# Patient Record
Sex: Male | Born: 1935
Health system: Southern US, Community
[De-identification: ages and names within clinical notes are randomized; demographics above are authoritative.]

## PROBLEM LIST (undated history)

## (undated) DIAGNOSIS — I10 Essential (primary) hypertension: Secondary | ICD-10-CM

## (undated) DIAGNOSIS — N200 Calculus of kidney: Secondary | ICD-10-CM

## (undated) DIAGNOSIS — H35372 Puckering of macula, left eye: Secondary | ICD-10-CM

## (undated) DIAGNOSIS — N400 Enlarged prostate without lower urinary tract symptoms: Secondary | ICD-10-CM

## (undated) DIAGNOSIS — E7800 Pure hypercholesterolemia, unspecified: Secondary | ICD-10-CM

## (undated) DIAGNOSIS — I4892 Unspecified atrial flutter: Secondary | ICD-10-CM

## (undated) DIAGNOSIS — G971 Other reaction to spinal and lumbar puncture: Secondary | ICD-10-CM

## (undated) DIAGNOSIS — J449 Chronic obstructive pulmonary disease, unspecified: Secondary | ICD-10-CM

## (undated) DIAGNOSIS — I499 Cardiac arrhythmia, unspecified: Secondary | ICD-10-CM

## (undated) DIAGNOSIS — H9190 Unspecified hearing loss, unspecified ear: Secondary | ICD-10-CM

## (undated) DIAGNOSIS — Z87442 Personal history of urinary calculi: Secondary | ICD-10-CM

## (undated) DIAGNOSIS — M109 Gout, unspecified: Secondary | ICD-10-CM

## (undated) HISTORY — PX: BACK SURGERY: SHX140

## (undated) HISTORY — PX: EYE SURGERY: SHX253

---

## 1972-12-30 DIAGNOSIS — G971 Other reaction to spinal and lumbar puncture: Secondary | ICD-10-CM

## 1972-12-30 HISTORY — PX: LUMBAR DISC SURGERY: SHX700

## 1972-12-30 HISTORY — DX: Other reaction to spinal and lumbar puncture: G97.1

## 1979-12-31 HISTORY — PX: FOOT FRACTURE SURGERY: SHX645

## 1989-08-30 HISTORY — PX: MULTIPLE TOOTH EXTRACTIONS: SHX2053

## 2015-06-09 ENCOUNTER — Encounter (INDEPENDENT_AMBULATORY_CARE_PROVIDER_SITE_OTHER): Payer: Medicare FFS | Admitting: Ophthalmology

## 2015-06-09 DIAGNOSIS — H2513 Age-related nuclear cataract, bilateral: Secondary | ICD-10-CM | POA: Diagnosis not present

## 2015-06-09 DIAGNOSIS — H35033 Hypertensive retinopathy, bilateral: Secondary | ICD-10-CM

## 2015-06-09 DIAGNOSIS — H35372 Puckering of macula, left eye: Secondary | ICD-10-CM

## 2015-06-09 DIAGNOSIS — H3531 Nonexudative age-related macular degeneration: Secondary | ICD-10-CM

## 2015-06-09 DIAGNOSIS — I10 Essential (primary) hypertension: Secondary | ICD-10-CM | POA: Diagnosis not present

## 2015-06-09 DIAGNOSIS — H43813 Vitreous degeneration, bilateral: Secondary | ICD-10-CM | POA: Diagnosis not present

## 2015-07-17 ENCOUNTER — Encounter (HOSPITAL_COMMUNITY): Payer: Self-pay | Admitting: *Deleted

## 2015-07-17 MED ORDER — CEFAZOLIN SODIUM-DEXTROSE 2-3 GM-% IV SOLR
2.0000 g | INTRAVENOUS | Status: AC
  Administered 2015-07-18: 2 g via INTRAVENOUS

## 2015-07-17 NOTE — Progress Notes (Signed)
   07/17/15 1625  OBSTRUCTIVE SLEEP APNEA  Have you ever been diagnosed with sleep apnea through a sleep study? No  Do you snore loudly (loud enough to be heard through closed doors)?  1  Do you often feel tired, fatigued, or sleepy during the daytime? 0  Has anyone observed you stop breathing during your sleep? 0  Do you have, or are you being treated for high blood pressure? 1  Age over 79 years old? 1  Gender: 1  Obstructive Sleep Apnea Score 4

## 2015-07-17 NOTE — Progress Notes (Signed)
Pt gave verbal consent for Kendal Hymen, spouse to complete SDW-Pre-op  call.  Pt denies SOB, chest pain and being under the care of a cardiologist. Spouse denies that pt has had a chest x ray and EKG within the last year. Spouse denies that pt has had an echo, stress and cardiac cath. Spouse made aware to have pt stop taking  Aspirin, otc vitamins and herbal medications. Do not take any NSAIDs ie: Ibuprofen, Advil, Naproxen or any medication containing Aspirin. Spouse, Kendal Hymen verbalized understanding of all pre-op instructions.

## 2015-07-17 NOTE — H&P (Signed)
Timothy Mata is an 81 y.o. male.   Chief Complaint: loss of vision left eye over two months HPI: Preretinal fibrosis left eye with vision loss over two months.  No past medical history on file.  No past surgical history on file.  No family history on file. Social History:  has no tobacco, alcohol, and drug history on file.  Allergies: No Known Allergies  No prescriptions prior to admission    Review of systems otherwise negative  There were no vitals taken for this visit.  Physical exam: Mental status: oriented x3. Eyes: See eye exam associated with this date of surgery in media tab.  Scanned in by scanning center Ears, Nose, Throat: within normal limits Neck: Within Normal limits General: within normal limits Chest: Within normal limits Breast: deferred Heart: Within normal limits Abdomen: Within normal limits GU: deferred Extremities: within normal limits Skin: within normal limits  Assessment/Plan Preretinal fibrosis with macular hemorrhage.   Plan: To [facility] for Pars plana vitrectomy, laser, membrane peel, gas injection left eye.  Sherrie George 07/17/2015, 11:23 AM

## 2015-07-18 ENCOUNTER — Ambulatory Visit (HOSPITAL_COMMUNITY): Payer: Medicare FFS | Admitting: Anesthesiology

## 2015-07-18 ENCOUNTER — Encounter (HOSPITAL_COMMUNITY): Payer: Self-pay | Admitting: Anesthesiology

## 2015-07-18 ENCOUNTER — Ambulatory Visit (HOSPITAL_COMMUNITY)
Admission: RE | Admit: 2015-07-18 | Discharge: 2015-07-19 | Disposition: A | Discharge status: Home / Self Care | Payer: Medicare FFS | Source: Ambulatory Visit | Attending: Ophthalmology | Admitting: Ophthalmology

## 2015-07-18 ENCOUNTER — Encounter (HOSPITAL_COMMUNITY)
Admission: RE | Disposition: A | Discharge status: Home / Self Care | Payer: Self-pay | Source: Ambulatory Visit | Attending: Ophthalmology

## 2015-07-18 ENCOUNTER — Encounter (INDEPENDENT_AMBULATORY_CARE_PROVIDER_SITE_OTHER): Payer: Medicare FFS | Admitting: Ophthalmology

## 2015-07-18 DIAGNOSIS — H35372 Puckering of macula, left eye: Secondary | ICD-10-CM

## 2015-07-18 DIAGNOSIS — H3531 Nonexudative age-related macular degeneration: Secondary | ICD-10-CM

## 2015-07-18 DIAGNOSIS — H43813 Vitreous degeneration, bilateral: Secondary | ICD-10-CM | POA: Diagnosis not present

## 2015-07-18 DIAGNOSIS — H35033 Hypertensive retinopathy, bilateral: Secondary | ICD-10-CM | POA: Diagnosis not present

## 2015-07-18 DIAGNOSIS — I10 Essential (primary) hypertension: Secondary | ICD-10-CM

## 2015-07-18 DIAGNOSIS — H269 Unspecified cataract: Secondary | ICD-10-CM | POA: Insufficient documentation

## 2015-07-18 DIAGNOSIS — Z23 Encounter for immunization: Secondary | ICD-10-CM | POA: Diagnosis not present

## 2015-07-18 HISTORY — DX: Puckering of macula, left eye: H35.372

## 2015-07-18 HISTORY — PX: 25 GAUGE PARS PLANA VITRECTOMY WITH 20 GAUGE MVR PORT: SHX6041

## 2015-07-18 HISTORY — DX: Pure hypercholesterolemia, unspecified: E78.00

## 2015-07-18 HISTORY — PX: AIR/FLUID EXCHANGE: SHX6494

## 2015-07-18 HISTORY — DX: Calculus of kidney: N20.0

## 2015-07-18 HISTORY — DX: Chronic obstructive pulmonary disease, unspecified: J44.9

## 2015-07-18 HISTORY — DX: Other reaction to spinal and lumbar puncture: G97.1

## 2015-07-18 HISTORY — PX: PARS PLANA VITRECTOMY: SHX2166

## 2015-07-18 HISTORY — DX: Essential (primary) hypertension: I10

## 2015-07-18 HISTORY — PX: LASER PHOTO ABLATION: SHX5942

## 2015-07-18 HISTORY — DX: Gout, unspecified: M10.9

## 2015-07-18 HISTORY — PX: MEMBRANE PEEL: SHX5967

## 2015-07-18 HISTORY — DX: Unspecified hearing loss, unspecified ear: H91.90

## 2015-07-18 HISTORY — DX: Benign prostatic hyperplasia without lower urinary tract symptoms: N40.0

## 2015-07-18 LAB — BASIC METABOLIC PANEL
ANION GAP: 7 (ref 5–15)
BUN: 16 mg/dL (ref 6–20)
CALCIUM: 8.7 mg/dL — AB (ref 8.9–10.3)
CHLORIDE: 104 mmol/L (ref 101–111)
CO2: 28 mmol/L (ref 22–32)
Creatinine, Ser: 1.57 mg/dL — ABNORMAL HIGH (ref 0.61–1.24)
GFR, EST AFRICAN AMERICAN: 47 mL/min — AB (ref >60)
GFR, EST NON AFRICAN AMERICAN: 40 mL/min — AB (ref >60)
Glucose, Bld: 104 mg/dL — ABNORMAL HIGH (ref 65–99)
Potassium: 4.3 mmol/L (ref 3.5–5.1)
Sodium: 139 mmol/L (ref 135–145)

## 2015-07-18 SURGERY — 25 GAUGE PARS PLANA VITRECTOMY WITH 20 GAUGE MVR PORT
Anesthesia: General | Site: Eye | Laterality: Left

## 2015-07-18 MED ORDER — SODIUM CHLORIDE 0.9 % IJ SOLN
INTRAMUSCULAR | Status: AC
  Filled 2015-07-18: qty 10

## 2015-07-18 MED ORDER — LACTATED RINGERS IV SOLN
INTRAVENOUS | Status: DC | PRN
  Administered 2015-07-18: 12:00:00 via INTRAVENOUS

## 2015-07-18 MED ORDER — ATROPINE SULFATE 1 MG/ML IJ SOLN
INTRAMUSCULAR | Status: DC | PRN
  Administered 2015-07-18: 0.4 mg via INTRAVENOUS

## 2015-07-18 MED ORDER — BACITRACIN-POLYMYXIN B 500-10000 UNIT/GM OP OINT
TOPICAL_OINTMENT | OPHTHALMIC | Status: AC
  Filled 2015-07-18: qty 3.5

## 2015-07-18 MED ORDER — PHENYLEPHRINE HCL 2.5 % OP SOLN
1.0000 [drp] | OPHTHALMIC | Status: AC | PRN
  Administered 2015-07-18 (×3): 1 [drp] via OPHTHALMIC

## 2015-07-18 MED ORDER — 0.9 % SODIUM CHLORIDE (POUR BTL) OPTIME
TOPICAL | Status: DC | PRN
  Administered 2015-07-18: 1000 mL

## 2015-07-18 MED ORDER — TAMSULOSIN HCL 0.4 MG PO CAPS
0.4000 mg | ORAL_CAPSULE | Freq: Every day | ORAL | Status: DC
  Administered 2015-07-18: 0.4 mg via ORAL
  Filled 2015-07-18: qty 1

## 2015-07-18 MED ORDER — CYCLOPENTOLATE HCL 1 % OP SOLN
OPHTHALMIC | Status: AC
  Administered 2015-07-18: 1 [drp] via OPHTHALMIC
  Filled 2015-07-18: qty 2

## 2015-07-18 MED ORDER — BACITRACIN-POLYMYXIN B 500-10000 UNIT/GM OP OINT
1.0000 | TOPICAL_OINTMENT | Freq: Three times a day (TID) | OPHTHALMIC | Status: DC
Start: 2015-07-19 — End: 2015-07-19
  Filled 2015-07-18: qty 3.5

## 2015-07-18 MED ORDER — POLYMYXIN B SULFATE 500000 UNITS IJ SOLR
INTRAMUSCULAR | Status: AC
  Filled 2015-07-18: qty 1

## 2015-07-18 MED ORDER — BUPIVACAINE HCL (PF) 0.75 % IJ SOLN
INTRAMUSCULAR | Status: DC | PRN
  Administered 2015-07-18: 20 mL

## 2015-07-18 MED ORDER — SUGAMMADEX SODIUM 200 MG/2ML IV SOLN
INTRAVENOUS | Status: DC | PRN
  Administered 2015-07-18: 200 mg via INTRAVENOUS

## 2015-07-18 MED ORDER — LIDOCAINE HCL (CARDIAC) 20 MG/ML IV SOLN
INTRAVENOUS | Status: DC | PRN
  Administered 2015-07-18: 100 mg via INTRAVENOUS

## 2015-07-18 MED ORDER — ONDANSETRON HCL 4 MG/2ML IJ SOLN
4.0000 mg | Freq: Four times a day (QID) | INTRAMUSCULAR | Status: DC
  Administered 2015-07-18 – 2015-07-19 (×3): 4 mg via INTRAVENOUS
  Filled 2015-07-18 (×3): qty 2

## 2015-07-18 MED ORDER — ATROPINE SULFATE 1 % OP SOLN
OPHTHALMIC | Status: DC | PRN
  Administered 2015-07-18: 1 [drp] via OPHTHALMIC

## 2015-07-18 MED ORDER — SUGAMMADEX SODIUM 200 MG/2ML IV SOLN
INTRAVENOUS | Status: AC
  Filled 2015-07-18: qty 2

## 2015-07-18 MED ORDER — GATIFLOXACIN 0.5 % OP SOLN
1.0000 [drp] | OPHTHALMIC | Status: AC | PRN
  Administered 2015-07-18 (×3): 1 [drp] via OPHTHALMIC

## 2015-07-18 MED ORDER — TEMAZEPAM 15 MG PO CAPS: 15.0000 mg | ORAL_CAPSULE | Freq: Every evening | ORAL | Status: DC | PRN

## 2015-07-18 MED ORDER — SODIUM CHLORIDE 0.9 % IV SOLN
INTRAVENOUS | Status: DC
  Administered 2015-07-18: 11:00:00 via INTRAVENOUS

## 2015-07-18 MED ORDER — MOMETASONE FURO-FORMOTEROL FUM 100-5 MCG/ACT IN AERO
2.0000 | INHALATION_SPRAY | Freq: Two times a day (BID) | RESPIRATORY_TRACT | Status: DC
Start: 2015-07-18 — End: 2015-07-19
  Administered 2015-07-18 – 2015-07-19 (×2): 2 via RESPIRATORY_TRACT
  Filled 2015-07-18: qty 8.8

## 2015-07-18 MED ORDER — BRIMONIDINE TARTRATE 0.2 % OP SOLN
1.0000 [drp] | Freq: Two times a day (BID) | OPHTHALMIC | Status: DC
  Filled 2015-07-18: qty 5

## 2015-07-18 MED ORDER — LISINOPRIL-HYDROCHLOROTHIAZIDE 20-25 MG PO TABS: 1.0000 | ORAL_TABLET | Freq: Every day | ORAL | Status: DC

## 2015-07-18 MED ORDER — ROCURONIUM BROMIDE 100 MG/10ML IV SOLN
INTRAVENOUS | Status: DC | PRN
  Administered 2015-07-18: 50 mg via INTRAVENOUS

## 2015-07-18 MED ORDER — ALLOPURINOL 300 MG PO TABS
300.0000 mg | ORAL_TABLET | Freq: Every day | ORAL | Status: DC
Start: 2015-07-18 — End: 2015-07-19
  Administered 2015-07-18: 300 mg via ORAL
  Filled 2015-07-18: qty 1

## 2015-07-18 MED ORDER — ATENOLOL 50 MG PO TABS
50.0000 mg | ORAL_TABLET | Freq: Every day | ORAL | Status: DC
  Administered 2015-07-18: 50 mg via ORAL
  Filled 2015-07-18: qty 1

## 2015-07-18 MED ORDER — ACETAMINOPHEN 325 MG PO TABS: 325.0000 mg | ORAL_TABLET | ORAL | Status: DC | PRN

## 2015-07-18 MED ORDER — TROPICAMIDE 1 % OP SOLN
1.0000 [drp] | OPHTHALMIC | Status: AC | PRN
  Administered 2015-07-18 (×3): 1 [drp] via OPHTHALMIC

## 2015-07-18 MED ORDER — MAGNESIUM HYDROXIDE 400 MG/5ML PO SUSP: 15.0000 mL | Freq: Four times a day (QID) | ORAL | Status: DC | PRN

## 2015-07-18 MED ORDER — TROPICAMIDE 1 % OP SOLN
OPHTHALMIC | Status: AC
  Administered 2015-07-18: 1 [drp] via OPHTHALMIC
  Filled 2015-07-18: qty 3

## 2015-07-18 MED ORDER — GATIFLOXACIN 0.5 % OP SOLN
1.0000 [drp] | Freq: Four times a day (QID) | OPHTHALMIC | Status: DC
  Filled 2015-07-18: qty 2.5

## 2015-07-18 MED ORDER — SIMVASTATIN 40 MG PO TABS: 40.0000 mg | ORAL_TABLET | Freq: Every day | ORAL | Status: DC

## 2015-07-18 MED ORDER — LATANOPROST 0.005 % OP SOLN
1.0000 [drp] | Freq: Every day | OPHTHALMIC | Status: DC
  Filled 2015-07-18: qty 2.5

## 2015-07-18 MED ORDER — LISINOPRIL 20 MG PO TABS
20.0000 mg | ORAL_TABLET | Freq: Every day | ORAL | Status: DC
  Administered 2015-07-18 – 2015-07-19 (×2): 20 mg via ORAL
  Filled 2015-07-18 (×2): qty 1

## 2015-07-18 MED ORDER — HYDROCODONE-ACETAMINOPHEN 5-325 MG PO TABS: 1.0000 | ORAL_TABLET | ORAL | Status: DC | PRN

## 2015-07-18 MED ORDER — GATIFLOXACIN 0.5 % OP SOLN
OPHTHALMIC | Status: AC
  Administered 2015-07-18: 1 [drp] via OPHTHALMIC
  Filled 2015-07-18: qty 2.5

## 2015-07-18 MED ORDER — SODIUM HYALURONATE 10 MG/ML IO SOLN
INTRAOCULAR | Status: DC | PRN
  Administered 2015-07-18: 0.85 mL via INTRAOCULAR

## 2015-07-18 MED ORDER — EPHEDRINE SULFATE 50 MG/ML IJ SOLN
INTRAMUSCULAR | Status: DC | PRN
  Administered 2015-07-18 (×3): 10 mg via INTRAVENOUS

## 2015-07-18 MED ORDER — PNEUMOCOCCAL VAC POLYVALENT 25 MCG/0.5ML IJ INJ
0.5000 mL | INJECTION | INTRAMUSCULAR | Status: AC
  Administered 2015-07-19: 0.5 mL via INTRAMUSCULAR

## 2015-07-18 MED ORDER — MORPHINE SULFATE 2 MG/ML IJ SOLN: 1.0000 mg | INTRAMUSCULAR | Status: DC | PRN

## 2015-07-18 MED ORDER — PHENYLEPHRINE HCL 2.5 % OP SOLN
OPHTHALMIC | Status: AC
  Administered 2015-07-18: 1 [drp] via OPHTHALMIC
  Filled 2015-07-18: qty 2

## 2015-07-18 MED ORDER — BACITRACIN-POLYMYXIN B 500-10000 UNIT/GM OP OINT
TOPICAL_OINTMENT | OPHTHALMIC | Status: DC | PRN
  Administered 2015-07-18: 1 via OPHTHALMIC

## 2015-07-18 MED ORDER — SODIUM HYALURONATE 10 MG/ML IO SOLN
INTRAOCULAR | Status: AC
  Filled 2015-07-18: qty 0.85

## 2015-07-18 MED ORDER — ACETAZOLAMIDE SODIUM 500 MG IJ SOLR
500.0000 mg | Freq: Once | INTRAMUSCULAR | Status: AC
  Administered 2015-07-19: 500 mg via INTRAVENOUS
  Filled 2015-07-18: qty 500

## 2015-07-18 MED ORDER — SODIUM CHLORIDE 0.45 % IV SOLN
INTRAVENOUS | Status: DC
  Administered 2015-07-18: 100 mL/h via INTRAVENOUS

## 2015-07-18 MED ORDER — DEXAMETHASONE SODIUM PHOSPHATE 10 MG/ML IJ SOLN
INTRAMUSCULAR | Status: DC | PRN
  Administered 2015-07-18: 10 mg via INTRAVENOUS

## 2015-07-18 MED ORDER — BSS PLUS IO SOLN
INTRAOCULAR | Status: AC
  Filled 2015-07-18: qty 500

## 2015-07-18 MED ORDER — POLYMYXIN B SULFATE 500000 UNITS IJ SOLR
INTRAMUSCULAR | Status: DC | PRN
  Administered 2015-07-18: 11:00:00

## 2015-07-18 MED ORDER — GENTAMICIN SULFATE 40 MG/ML IJ SOLN
INTRAMUSCULAR | Status: AC
  Filled 2015-07-18: qty 2

## 2015-07-18 MED ORDER — BUPIVACAINE HCL (PF) 0.75 % IJ SOLN
INTRAMUSCULAR | Status: AC
  Filled 2015-07-18: qty 10

## 2015-07-18 MED ORDER — PREDNISOLONE ACETATE 1 % OP SUSP
1.0000 [drp] | Freq: Four times a day (QID) | OPHTHALMIC | Status: DC
  Filled 2015-07-18: qty 5

## 2015-07-18 MED ORDER — FENTANYL CITRATE (PF) 250 MCG/5ML IJ SOLN
INTRAMUSCULAR | Status: AC
  Filled 2015-07-18: qty 5

## 2015-07-18 MED ORDER — HYALURONIDASE HUMAN 150 UNIT/ML IJ SOLN
INTRAMUSCULAR | Status: AC
  Filled 2015-07-18: qty 1

## 2015-07-18 MED ORDER — CEFAZOLIN SODIUM-DEXTROSE 2-3 GM-% IV SOLR
INTRAVENOUS | Status: AC
  Filled 2015-07-18: qty 50

## 2015-07-18 MED ORDER — HYDROCHLOROTHIAZIDE 25 MG PO TABS
25.0000 mg | ORAL_TABLET | Freq: Every day | ORAL | Status: DC
  Administered 2015-07-18 – 2015-07-19 (×2): 25 mg via ORAL
  Filled 2015-07-18 (×2): qty 1

## 2015-07-18 MED ORDER — EPINEPHRINE HCL 1 MG/ML IJ SOLN
INTRAMUSCULAR | Status: AC
  Filled 2015-07-18: qty 1

## 2015-07-18 MED ORDER — CYCLOPENTOLATE HCL 1 % OP SOLN
1.0000 [drp] | OPHTHALMIC | Status: AC | PRN
  Administered 2015-07-18 (×3): 1 [drp] via OPHTHALMIC

## 2015-07-18 MED ORDER — TETRACAINE HCL 0.5 % OP SOLN
2.0000 [drp] | Freq: Once | OPHTHALMIC | Status: DC
  Filled 2015-07-18: qty 2

## 2015-07-18 MED ORDER — EPINEPHRINE HCL 1 MG/ML IJ SOLN
INTRAOCULAR | Status: DC | PRN
  Administered 2015-07-18: 11:00:00

## 2015-07-18 MED ORDER — ATROPINE SULFATE 1 % OP SOLN
OPHTHALMIC | Status: AC
  Filled 2015-07-18: qty 5

## 2015-07-18 MED ORDER — DEXAMETHASONE SODIUM PHOSPHATE 10 MG/ML IJ SOLN
INTRAMUSCULAR | Status: AC
  Filled 2015-07-18: qty 1

## 2015-07-18 SURGICAL SUPPLY — 51 items
BLADE MVR KNIFE 20G (BLADE) ×2 IMPLANT
CANNULA VLV SOFT TIP 25GA (OPHTHALMIC) ×2 IMPLANT
CORDS BIPOLAR (ELECTRODE) ×2 IMPLANT
COTTONBALL LRG STERILE PKG (GAUZE/BANDAGES/DRESSINGS) ×6 IMPLANT
COVER MAYO STAND STRL (DRAPES) ×2 IMPLANT
DRAPE OPHTHALMIC 77X100 STRL (CUSTOM PROCEDURE TRAY) ×2 IMPLANT
EAGLE VIT/RET MICRO PIC 168 25 (MISCELLANEOUS) ×2 IMPLANT
FORCEPS GRIESHABER ILM 25G A (INSTRUMENTS) ×2 IMPLANT
GLOVE BIOGEL PI IND STRL 6.5 (GLOVE) ×1 IMPLANT
GLOVE BIOGEL PI INDICATOR 6.5 (GLOVE) ×1
GLOVE SS BIOGEL STRL SZ 6.5 (GLOVE) ×1 IMPLANT
GLOVE SS BIOGEL STRL SZ 7 (GLOVE) ×1 IMPLANT
GLOVE SUPERSENSE BIOGEL SZ 6.5 (GLOVE) ×1
GLOVE SUPERSENSE BIOGEL SZ 7 (GLOVE) ×1
GLOVE SURG 8.5 LATEX PF (GLOVE) ×2 IMPLANT
GLOVE SURG SS PI 6.5 STRL IVOR (GLOVE) ×2 IMPLANT
GOWN STRL REUS W/ TWL LRG LVL3 (GOWN DISPOSABLE) ×3 IMPLANT
GOWN STRL REUS W/TWL LRG LVL3 (GOWN DISPOSABLE) ×3
HANDLE PNEUMATIC FOR CONSTEL (OPHTHALMIC) ×2 IMPLANT
KIT BASIN OR (CUSTOM PROCEDURE TRAY) ×2 IMPLANT
KIT ROOM TURNOVER OR (KITS) ×2 IMPLANT
MICROPICK 25G (MISCELLANEOUS)
NEEDLE 18GX1X1/2 (RX/OR ONLY) (NEEDLE) ×2 IMPLANT
NEEDLE 25GX 5/8IN NON SAFETY (NEEDLE) ×2 IMPLANT
NEEDLE FILTER BLUNT 18X 1/2SAF (NEEDLE) ×1
NEEDLE FILTER BLUNT 18X1 1/2 (NEEDLE) ×1 IMPLANT
NEEDLE HYPO 30X.5 LL (NEEDLE) ×4 IMPLANT
NS IRRIG 1000ML POUR BTL (IV SOLUTION) ×2 IMPLANT
PACK VITRECTOMY CUSTOM (CUSTOM PROCEDURE TRAY) ×2 IMPLANT
PAD ARMBOARD 7.5X6 YLW CONV (MISCELLANEOUS) ×4 IMPLANT
PAK PIK VITRECTOMY CVS 25GA (OPHTHALMIC) ×2 IMPLANT
PIC ILLUMINATED 25G (OPHTHALMIC) ×2
PICK MICROPICK 25G (MISCELLANEOUS) IMPLANT
PIK ILLUMINATED 25G (OPHTHALMIC) ×1 IMPLANT
PROBE LASER ILLUM FLEX CVD 25G (OPHTHALMIC) ×2 IMPLANT
REPL STRA BRUSH NEEDLE (NEEDLE) ×2 IMPLANT
RESERVOIR BACK FLUSH (MISCELLANEOUS) ×2 IMPLANT
ROLLS DENTAL (MISCELLANEOUS) ×4 IMPLANT
SCRAPER DIAMOND 25GA (OPHTHALMIC RELATED) IMPLANT
SCRAPER DIAMOND DUST MEMBRANE (MISCELLANEOUS) ×2 IMPLANT
SPONGE SURGIFOAM ABS GEL 12-7 (HEMOSTASIS) ×4 IMPLANT
SUT CHROMIC 7 0 TG140 8 (SUTURE) IMPLANT
SUT ETHILON 9 0 TG140 8 (SUTURE) ×2 IMPLANT
SUT VICRYL 7 0 TG140 8 (SUTURE) ×2 IMPLANT
SYR 20CC LL (SYRINGE) ×2 IMPLANT
SYR BULB 3OZ (MISCELLANEOUS) ×2 IMPLANT
SYR TB 1ML LUER SLIP (SYRINGE) ×2 IMPLANT
TOWEL OR 17X24 6PK STRL BLUE (TOWEL DISPOSABLE) ×2 IMPLANT
TOWEL OR 17X26 10 PK STRL BLUE (TOWEL DISPOSABLE) ×2 IMPLANT
WATER STERILE IRR 1000ML POUR (IV SOLUTION) ×2 IMPLANT
WIPE INSTRUMENT VISIWIPE 73X73 (MISCELLANEOUS) ×2 IMPLANT

## 2015-07-18 NOTE — Brief Op Note (Signed)
Brief Operative note   Preoperative diagnosis:  Pre retinal fibrosis left eye Postoperative diagnosis  same  Procedures: Pars plana vitrectomy, laser, membrane peel, gas injection left eye  Surgeon:  Sherrie George, MD...  Assistant:  Rosalie Doctor SA    Anesthesia: General  Specimen: none  Estimated blood loss:  1cc  Complications: none  Patient sent to PACU in good condition  Composed by Sherrie George MD  Dictation number: [id number]

## 2015-07-18 NOTE — Transfer of Care (Signed)
Immediate Anesthesia Transfer of Care Note  Patient: Timothy Mata  Procedure(s) Performed: Procedure(s) with comments: 25 GAUGE PARS PLANA VITRECTOMY WITH 20 GAUGE MVR PORT (Left) LASER PHOTO ABLATION (Left) - head scope  MEMBRANE PEEL (Left) AIR/FLUID EXCHANGE (Left)  Patient Location: PACU  Anesthesia Type:General  Level of Consciousness: sedated  Airway & Oxygen Therapy: Patient Spontanous Breathing and Patient connected to nasal cannula oxygen  Post-op Assessment: Report given to RN, Post -op Vital signs reviewed and stable and Patient moving all extremities X 4  Post vital signs: Reviewed and stable  Last Vitals:  Filed Vitals:   07/18/15 0935  BP: 177/67  Pulse: 58  Temp: 36.3 C  Resp: 18    Complications: No apparent anesthesia complications

## 2015-07-18 NOTE — Op Note (Signed)
Timothy Mata, Timothy Mata NO.:  [id number]  MEDICAL RECORD NO.:  [id number]  LOCATION:  [id number]                        FACILITY:  [facility]  PHYSICIAN:  Beulah Gandy. Ashley Royalty, M.D. DATE OF BIRTH:  [birth year]  DATE OF PROCEDURE:  07/18/2015 DATE OF DISCHARGE:                              OPERATIVE REPORT   ADMISSION DIAGNOSES: 1. Preretinal fibrosis, left eye. 2. Cataract, left eye.  PROCEDURES:  Pars plana vitrectomy with retinal photocoagulation, gas- fluid exchange, and membrane peel, left eye.  SURGEON:  Beulah Gandy. Ashley Royalty, M.D.  ASSISTANT:  Rosalie Doctor, SA.  ANESTHESIA:  General.  DESCRIPTION OF PROCEDURE:  The indirect ophthalmoscope laser was moved into place.  Inspection of the periphery was done with the indirect ophthalmoscope.  654 burns were placed around extreme retinal periphery with a power of 400 mW, 1000 microns each, and 0.07 seconds each.  Weak areas of retina were supported and treated with laser.  Once this was accomplished, attention was carried to the pars plana area where a 3- layered 20-gauge wound was opened at 2 o'clock with a diamond knife and MVR incision.  25-gauge trocars were placed at 4 and 10 with infusion at 4 o'clock.  Contact lens ring was anchored into place at 6 and 12 o'clock.  Provisc was placed on the corneal surface and the flat contact lens was placed.  Pars plana vitrectomy was begun just behind the cataractous lens.  Dense vitreous opacities were encountered and carefully removed under low suction and rapid cutting.  Few focused down to the macular region where a preretinal membrane was seen.  Several areas of atrophy from preretinal fibrosis from age related macular degeneration were encountered, so that were areas of atrophy from age- related macular degeneration were encountered.  The 25-gauge pick was used to engage the epiretinal membrane, and ILM forceps were used to grasp the membrane and peel it, strip it from its  attachments to the macular region.  Several different maneuvers with diamond-dusted membrane scraper, pick, and forceps were used until the entire membrane was stripped across and removed from the macular region.  There were no complications.  The additional vitrectomy was carried out into the mid periphery of the contact lens and contact lens ring was removed.  The BIOM viewing system was moved into place.  The vitrectomy was carried down to the vitreous base for 360 degrees.  Inspection of the periphery showed no breaks.  A 30% gas-fluid exchange was carried out.  The instruments were removed from the eye.  9-0 nylon was used to close the sclerotomy site at 2 o'clock and wet-field cautery was used for conjunctival closure.  25-gauge trocars were removed.  The wounds were tested and found to be secured. Polymyxin and gentamicin were irrigated into tenon space.  Atropine solution was applied.  Marcaine was injected around the globe for postop pain.  Polysporin ophthalmic ointment, patch and shield were placed. The patient was awakened, taken to recovery in satisfactory condition.     Beulah Gandy. Ashley Royalty, M.D.     JDM/MEDQ  D:  07/18/2015  T:  07/18/2015  Job:  [id number]

## 2015-07-18 NOTE — H&P (Signed)
I examined the patient today and there is no change in the medical status 

## 2015-07-18 NOTE — Anesthesia Postprocedure Evaluation (Signed)
Anesthesia Post Note  Patient: Timothy Mata  Procedure(s) Performed: Procedure(s) (LRB): 25 GAUGE PARS PLANA VITRECTOMY WITH 20 GAUGE MVR PORT (Left) LASER PHOTO ABLATION (Left) MEMBRANE PEEL (Left) AIR/FLUID EXCHANGE (Left)  Anesthesia type: general  Patient location: PACU  Post pain: Pain level controlled  Post assessment: Patient's Cardiovascular Status Stable  Last Vitals:  Filed Vitals:   07/18/15 1330  BP: 163/70  Pulse: 84  Temp: 36.6 C  Resp: 14    Post vital signs: Reviewed and stable  Level of consciousness: sedated  Complications: No apparent anesthesia complications

## 2015-07-18 NOTE — Anesthesia Procedure Notes (Signed)
Procedure Name: Intubation Date/Time: 07/18/2015 11:42 AM Performed by: Carmela Rima Pre-anesthesia Checklist: Patient being monitored, Suction available, Emergency Drugs available, Patient identified and Timeout performed Patient Re-evaluated:Patient Re-evaluated prior to inductionOxygen Delivery Method: Circle system utilized Preoxygenation: Pre-oxygenation with 100% oxygen Intubation Type: IV induction Ventilation: Mask ventilation without difficulty Laryngoscope Size: Mac and 3 Grade View: Grade II Tube type: Oral Tube size: 7.5 mm Number of attempts: 1 Placement Confirmation: positive ETCO2,  ETT inserted through vocal cords under direct vision and breath sounds checked- equal and bilateral Secured at: 24 cm Tube secured with: Tape Dental Injury: Teeth and Oropharynx as per pre-operative assessment

## 2015-07-18 NOTE — Anesthesia Preprocedure Evaluation (Addendum)
Anesthesia Evaluation  Patient identified by MRN, date of birth, ID band Patient awake    Reviewed: Allergy & Precautions, NPO status , Patient's Chart, lab work & pertinent test results  Airway Mallampati: I  TM Distance: >3 FB Neck ROM: Full    Dental  (+) Dental Advidsory Given, Poor Dentition   Pulmonary COPDformer smoker,    Pulmonary exam normal       Cardiovascular hypertension, Pt. on medications Normal cardiovascular exam    Neuro/Psych    GI/Hepatic   Endo/Other    Renal/GU      Musculoskeletal   Abdominal   Peds  Hematology   Anesthesia Other Findings   Reproductive/Obstetrics                            Anesthesia Physical Anesthesia Plan  ASA: II  Anesthesia Plan: General   Post-op Pain Management:    Induction: Intravenous  Airway Management Planned: Oral ETT  Additional Equipment:   Intra-op Plan:   Post-operative Plan: Extubation in OR  Informed Consent: I have reviewed the patients History and Physical, chart, labs and discussed the procedure including the risks, benefits and alternatives for the proposed anesthesia with the patient or authorized representative who has indicated his/her understanding and acceptance.   Dental Advisory Given  Plan Discussed with: CRNA and Surgeon  Anesthesia Plan Comments:        Anesthesia Quick Evaluation

## 2015-07-19 DIAGNOSIS — H269 Unspecified cataract: Secondary | ICD-10-CM | POA: Diagnosis not present

## 2015-07-19 MED ORDER — GATIFLOXACIN 0.5 % OP SOLN: 1.0000 [drp] | Freq: Four times a day (QID) | OPHTHALMIC | Status: DC

## 2015-07-19 MED ORDER — BACITRACIN-POLYMYXIN B 500-10000 UNIT/GM OP OINT: 1.0000 "application " | TOPICAL_OINTMENT | Freq: Three times a day (TID) | OPHTHALMIC | Status: DC

## 2015-07-19 MED ORDER — PREDNISOLONE ACETATE 1 % OP SUSP: 1.0000 [drp] | Freq: Four times a day (QID) | OPHTHALMIC | Status: DC

## 2015-07-19 NOTE — Progress Notes (Signed)
Pt discharged to home via w/c services with all belongings in no s/s of distress. Sherald Barge

## 2015-07-19 NOTE — Progress Notes (Signed)
Discharge instructions/ eye drops use explained by MD.  Pt denies any questions for me at this time.  IV removed by night RN and site is CDI.  VSS. Pt awaiting ride home from wife. Timothy Mata

## 2015-07-19 NOTE — Progress Notes (Signed)
07/19/2015, 6:40 AM  Mental Status:  Awake, Alert, Oriented  Anterior segment: Cornea  Clear    Anterior Chamber Clear    Lens:   Cataract  Intra Ocular Pressure 19 mmHg with Tonopen  Vitreous: Clear 40%gas bubble  Retina:  Attached Good laser reaction   Impression: Excellent result Retina attached   Final Diagnosis: Principal Problem:   Preretinal fibrosis, left eye   Plan: start post operative eye drops.  Discharge to home.  Give post operative instructions  Sherrie George 07/19/2015, 6:40 AM

## 2015-07-19 NOTE — Discharge Summary (Signed)
Discharge summary not needed on [facility] patients per medical records. 

## 2015-07-20 ENCOUNTER — Encounter (HOSPITAL_COMMUNITY): Payer: Self-pay | Admitting: Ophthalmology

## 2015-07-25 ENCOUNTER — Inpatient Hospital Stay (INDEPENDENT_AMBULATORY_CARE_PROVIDER_SITE_OTHER): Payer: Medicare FFS | Admitting: Ophthalmology

## 2015-07-25 DIAGNOSIS — H35372 Puckering of macula, left eye: Secondary | ICD-10-CM

## 2015-08-16 ENCOUNTER — Encounter (INDEPENDENT_AMBULATORY_CARE_PROVIDER_SITE_OTHER): Payer: Medicare FFS | Admitting: Ophthalmology

## 2015-08-16 DIAGNOSIS — I10 Essential (primary) hypertension: Secondary | ICD-10-CM

## 2015-08-16 DIAGNOSIS — H59032 Cystoid macular edema following cataract surgery, left eye: Secondary | ICD-10-CM

## 2015-08-16 DIAGNOSIS — H35032 Hypertensive retinopathy, left eye: Secondary | ICD-10-CM | POA: Diagnosis not present

## 2015-10-25 ENCOUNTER — Encounter (INDEPENDENT_AMBULATORY_CARE_PROVIDER_SITE_OTHER): Payer: Medicare FFS | Admitting: Ophthalmology

## 2015-10-25 DIAGNOSIS — I10 Essential (primary) hypertension: Secondary | ICD-10-CM | POA: Diagnosis not present

## 2015-10-25 DIAGNOSIS — H35372 Puckering of macula, left eye: Secondary | ICD-10-CM

## 2015-10-25 DIAGNOSIS — H43813 Vitreous degeneration, bilateral: Secondary | ICD-10-CM

## 2015-10-25 DIAGNOSIS — H353134 Nonexudative age-related macular degeneration, bilateral, advanced atrophic with subfoveal involvement: Secondary | ICD-10-CM

## 2015-10-25 DIAGNOSIS — H35033 Hypertensive retinopathy, bilateral: Secondary | ICD-10-CM

## 2015-10-25 DIAGNOSIS — H2513 Age-related nuclear cataract, bilateral: Secondary | ICD-10-CM | POA: Diagnosis not present

## 2016-02-26 ENCOUNTER — Ambulatory Visit (INDEPENDENT_AMBULATORY_CARE_PROVIDER_SITE_OTHER): Payer: Medicare PPO | Admitting: Ophthalmology

## 2016-02-26 DIAGNOSIS — H2513 Age-related nuclear cataract, bilateral: Secondary | ICD-10-CM

## 2016-02-26 DIAGNOSIS — I10 Essential (primary) hypertension: Secondary | ICD-10-CM

## 2016-02-26 DIAGNOSIS — H353134 Nonexudative age-related macular degeneration, bilateral, advanced atrophic with subfoveal involvement: Secondary | ICD-10-CM | POA: Diagnosis not present

## 2016-02-26 DIAGNOSIS — H35033 Hypertensive retinopathy, bilateral: Secondary | ICD-10-CM | POA: Diagnosis not present

## 2016-02-26 DIAGNOSIS — H43813 Vitreous degeneration, bilateral: Secondary | ICD-10-CM

## 2016-08-26 ENCOUNTER — Ambulatory Visit (INDEPENDENT_AMBULATORY_CARE_PROVIDER_SITE_OTHER): Payer: Medicare PPO | Admitting: Ophthalmology

## 2016-08-26 DIAGNOSIS — H353134 Nonexudative age-related macular degeneration, bilateral, advanced atrophic with subfoveal involvement: Secondary | ICD-10-CM

## 2016-08-26 DIAGNOSIS — H43811 Vitreous degeneration, right eye: Secondary | ICD-10-CM | POA: Diagnosis not present

## 2016-08-26 DIAGNOSIS — H35033 Hypertensive retinopathy, bilateral: Secondary | ICD-10-CM

## 2016-08-26 DIAGNOSIS — I10 Essential (primary) hypertension: Secondary | ICD-10-CM

## 2016-08-26 DIAGNOSIS — H2511 Age-related nuclear cataract, right eye: Secondary | ICD-10-CM

## 2017-02-11 DIAGNOSIS — R946 Abnormal results of thyroid function studies: Secondary | ICD-10-CM | POA: Diagnosis not present

## 2017-02-11 DIAGNOSIS — N189 Chronic kidney disease, unspecified: Secondary | ICD-10-CM | POA: Diagnosis not present

## 2017-02-11 DIAGNOSIS — I10 Essential (primary) hypertension: Secondary | ICD-10-CM | POA: Diagnosis not present

## 2017-02-11 DIAGNOSIS — N400 Enlarged prostate without lower urinary tract symptoms: Secondary | ICD-10-CM | POA: Diagnosis not present

## 2017-02-11 DIAGNOSIS — E782 Mixed hyperlipidemia: Secondary | ICD-10-CM | POA: Diagnosis not present

## 2017-02-11 DIAGNOSIS — J449 Chronic obstructive pulmonary disease, unspecified: Secondary | ICD-10-CM | POA: Diagnosis not present

## 2017-02-19 DIAGNOSIS — R946 Abnormal results of thyroid function studies: Secondary | ICD-10-CM | POA: Diagnosis not present

## 2017-02-19 DIAGNOSIS — N189 Chronic kidney disease, unspecified: Secondary | ICD-10-CM | POA: Diagnosis not present

## 2017-02-19 DIAGNOSIS — I10 Essential (primary) hypertension: Secondary | ICD-10-CM | POA: Diagnosis not present

## 2017-02-19 DIAGNOSIS — E669 Obesity, unspecified: Secondary | ICD-10-CM | POA: Diagnosis not present

## 2017-02-19 DIAGNOSIS — N400 Enlarged prostate without lower urinary tract symptoms: Secondary | ICD-10-CM | POA: Diagnosis not present

## 2017-02-19 DIAGNOSIS — M1000 Idiopathic gout, unspecified site: Secondary | ICD-10-CM | POA: Diagnosis not present

## 2017-02-19 DIAGNOSIS — J449 Chronic obstructive pulmonary disease, unspecified: Secondary | ICD-10-CM | POA: Diagnosis not present

## 2017-02-19 DIAGNOSIS — E782 Mixed hyperlipidemia: Secondary | ICD-10-CM | POA: Diagnosis not present

## 2017-06-06 DIAGNOSIS — M1000 Idiopathic gout, unspecified site: Secondary | ICD-10-CM | POA: Diagnosis not present

## 2017-06-06 DIAGNOSIS — N189 Chronic kidney disease, unspecified: Secondary | ICD-10-CM | POA: Diagnosis not present

## 2017-06-06 DIAGNOSIS — E782 Mixed hyperlipidemia: Secondary | ICD-10-CM | POA: Diagnosis not present

## 2017-06-06 DIAGNOSIS — J449 Chronic obstructive pulmonary disease, unspecified: Secondary | ICD-10-CM | POA: Diagnosis not present

## 2017-06-06 DIAGNOSIS — I10 Essential (primary) hypertension: Secondary | ICD-10-CM | POA: Diagnosis not present

## 2017-06-06 DIAGNOSIS — R946 Abnormal results of thyroid function studies: Secondary | ICD-10-CM | POA: Diagnosis not present

## 2017-06-12 DIAGNOSIS — E782 Mixed hyperlipidemia: Secondary | ICD-10-CM | POA: Diagnosis not present

## 2017-06-12 DIAGNOSIS — N189 Chronic kidney disease, unspecified: Secondary | ICD-10-CM | POA: Diagnosis not present

## 2017-06-12 DIAGNOSIS — I10 Essential (primary) hypertension: Secondary | ICD-10-CM | POA: Diagnosis not present

## 2017-06-12 DIAGNOSIS — Z6834 Body mass index (BMI) 34.0-34.9, adult: Secondary | ICD-10-CM | POA: Diagnosis not present

## 2017-06-12 DIAGNOSIS — J449 Chronic obstructive pulmonary disease, unspecified: Secondary | ICD-10-CM | POA: Diagnosis not present

## 2017-06-12 DIAGNOSIS — M1000 Idiopathic gout, unspecified site: Secondary | ICD-10-CM | POA: Diagnosis not present

## 2017-06-12 DIAGNOSIS — R946 Abnormal results of thyroid function studies: Secondary | ICD-10-CM | POA: Diagnosis not present

## 2017-06-12 DIAGNOSIS — N400 Enlarged prostate without lower urinary tract symptoms: Secondary | ICD-10-CM | POA: Diagnosis not present

## 2017-08-26 ENCOUNTER — Ambulatory Visit (INDEPENDENT_AMBULATORY_CARE_PROVIDER_SITE_OTHER): Payer: Medicare HMO | Admitting: Ophthalmology

## 2017-08-26 DIAGNOSIS — H43813 Vitreous degeneration, bilateral: Secondary | ICD-10-CM | POA: Diagnosis not present

## 2017-08-26 DIAGNOSIS — H35033 Hypertensive retinopathy, bilateral: Secondary | ICD-10-CM

## 2017-08-26 DIAGNOSIS — I10 Essential (primary) hypertension: Secondary | ICD-10-CM | POA: Diagnosis not present

## 2017-08-26 DIAGNOSIS — H353124 Nonexudative age-related macular degeneration, left eye, advanced atrophic with subfoveal involvement: Secondary | ICD-10-CM

## 2017-08-26 DIAGNOSIS — H353112 Nonexudative age-related macular degeneration, right eye, intermediate dry stage: Secondary | ICD-10-CM

## 2017-10-13 DIAGNOSIS — N400 Enlarged prostate without lower urinary tract symptoms: Secondary | ICD-10-CM | POA: Diagnosis not present

## 2017-10-13 DIAGNOSIS — E782 Mixed hyperlipidemia: Secondary | ICD-10-CM | POA: Diagnosis not present

## 2017-10-13 DIAGNOSIS — I10 Essential (primary) hypertension: Secondary | ICD-10-CM | POA: Diagnosis not present

## 2017-10-13 DIAGNOSIS — J449 Chronic obstructive pulmonary disease, unspecified: Secondary | ICD-10-CM | POA: Diagnosis not present

## 2017-10-13 DIAGNOSIS — R946 Abnormal results of thyroid function studies: Secondary | ICD-10-CM | POA: Diagnosis not present

## 2017-10-13 DIAGNOSIS — N189 Chronic kidney disease, unspecified: Secondary | ICD-10-CM | POA: Diagnosis not present

## 2017-10-17 DIAGNOSIS — N400 Enlarged prostate without lower urinary tract symptoms: Secondary | ICD-10-CM | POA: Diagnosis not present

## 2017-10-17 DIAGNOSIS — Z23 Encounter for immunization: Secondary | ICD-10-CM | POA: Diagnosis not present

## 2017-10-17 DIAGNOSIS — I10 Essential (primary) hypertension: Secondary | ICD-10-CM | POA: Diagnosis not present

## 2017-10-17 DIAGNOSIS — Z1389 Encounter for screening for other disorder: Secondary | ICD-10-CM | POA: Diagnosis not present

## 2017-10-17 DIAGNOSIS — N183 Chronic kidney disease, stage 3 (moderate): Secondary | ICD-10-CM | POA: Diagnosis not present

## 2017-10-17 DIAGNOSIS — M1000 Idiopathic gout, unspecified site: Secondary | ICD-10-CM | POA: Diagnosis not present

## 2017-10-17 DIAGNOSIS — Z6834 Body mass index (BMI) 34.0-34.9, adult: Secondary | ICD-10-CM | POA: Diagnosis not present

## 2017-10-17 DIAGNOSIS — E782 Mixed hyperlipidemia: Secondary | ICD-10-CM | POA: Diagnosis not present

## 2017-10-22 DIAGNOSIS — H35033 Hypertensive retinopathy, bilateral: Secondary | ICD-10-CM | POA: Diagnosis not present

## 2017-10-22 DIAGNOSIS — Z961 Presence of intraocular lens: Secondary | ICD-10-CM | POA: Diagnosis not present

## 2017-10-22 DIAGNOSIS — H353124 Nonexudative age-related macular degeneration, left eye, advanced atrophic with subfoveal involvement: Secondary | ICD-10-CM | POA: Diagnosis not present

## 2017-10-22 DIAGNOSIS — H52209 Unspecified astigmatism, unspecified eye: Secondary | ICD-10-CM | POA: Diagnosis not present

## 2017-10-22 DIAGNOSIS — H353112 Nonexudative age-related macular degeneration, right eye, intermediate dry stage: Secondary | ICD-10-CM | POA: Diagnosis not present

## 2018-04-16 DIAGNOSIS — I10 Essential (primary) hypertension: Secondary | ICD-10-CM | POA: Diagnosis not present

## 2018-04-16 DIAGNOSIS — N183 Chronic kidney disease, stage 3 (moderate): Secondary | ICD-10-CM | POA: Diagnosis not present

## 2018-04-16 DIAGNOSIS — E782 Mixed hyperlipidemia: Secondary | ICD-10-CM | POA: Diagnosis not present

## 2018-04-16 DIAGNOSIS — J449 Chronic obstructive pulmonary disease, unspecified: Secondary | ICD-10-CM | POA: Diagnosis not present

## 2018-04-16 DIAGNOSIS — R040 Epistaxis: Secondary | ICD-10-CM | POA: Diagnosis not present

## 2018-04-23 DIAGNOSIS — J449 Chronic obstructive pulmonary disease, unspecified: Secondary | ICD-10-CM | POA: Diagnosis not present

## 2018-04-23 DIAGNOSIS — I10 Essential (primary) hypertension: Secondary | ICD-10-CM | POA: Diagnosis not present

## 2018-04-23 DIAGNOSIS — N183 Chronic kidney disease, stage 3 (moderate): Secondary | ICD-10-CM | POA: Diagnosis not present

## 2018-04-23 DIAGNOSIS — Z23 Encounter for immunization: Secondary | ICD-10-CM | POA: Diagnosis not present

## 2018-04-23 DIAGNOSIS — R946 Abnormal results of thyroid function studies: Secondary | ICD-10-CM | POA: Diagnosis not present

## 2018-04-23 DIAGNOSIS — E782 Mixed hyperlipidemia: Secondary | ICD-10-CM | POA: Diagnosis not present

## 2018-04-23 DIAGNOSIS — Z6835 Body mass index (BMI) 35.0-35.9, adult: Secondary | ICD-10-CM | POA: Diagnosis not present

## 2018-04-23 DIAGNOSIS — M1000 Idiopathic gout, unspecified site: Secondary | ICD-10-CM | POA: Diagnosis not present

## 2018-08-26 ENCOUNTER — Encounter (INDEPENDENT_AMBULATORY_CARE_PROVIDER_SITE_OTHER): Payer: Medicare HMO | Admitting: Ophthalmology

## 2018-08-26 DIAGNOSIS — H47212 Primary optic atrophy, left eye: Secondary | ICD-10-CM

## 2018-08-26 DIAGNOSIS — H353132 Nonexudative age-related macular degeneration, bilateral, intermediate dry stage: Secondary | ICD-10-CM

## 2018-08-26 DIAGNOSIS — H35033 Hypertensive retinopathy, bilateral: Secondary | ICD-10-CM

## 2018-08-26 DIAGNOSIS — H43811 Vitreous degeneration, right eye: Secondary | ICD-10-CM

## 2018-08-26 DIAGNOSIS — I10 Essential (primary) hypertension: Secondary | ICD-10-CM | POA: Diagnosis not present

## 2018-09-07 DIAGNOSIS — N183 Chronic kidney disease, stage 3 (moderate): Secondary | ICD-10-CM | POA: Diagnosis not present

## 2018-09-07 DIAGNOSIS — I10 Essential (primary) hypertension: Secondary | ICD-10-CM | POA: Diagnosis not present

## 2018-09-07 DIAGNOSIS — E782 Mixed hyperlipidemia: Secondary | ICD-10-CM | POA: Diagnosis not present

## 2018-09-07 DIAGNOSIS — R946 Abnormal results of thyroid function studies: Secondary | ICD-10-CM | POA: Diagnosis not present

## 2018-09-09 DIAGNOSIS — N400 Enlarged prostate without lower urinary tract symptoms: Secondary | ICD-10-CM | POA: Diagnosis not present

## 2018-09-09 DIAGNOSIS — R946 Abnormal results of thyroid function studies: Secondary | ICD-10-CM | POA: Diagnosis not present

## 2018-09-09 DIAGNOSIS — Z6835 Body mass index (BMI) 35.0-35.9, adult: Secondary | ICD-10-CM | POA: Diagnosis not present

## 2018-09-09 DIAGNOSIS — I10 Essential (primary) hypertension: Secondary | ICD-10-CM | POA: Diagnosis not present

## 2018-09-09 DIAGNOSIS — E782 Mixed hyperlipidemia: Secondary | ICD-10-CM | POA: Diagnosis not present

## 2018-09-09 DIAGNOSIS — M1000 Idiopathic gout, unspecified site: Secondary | ICD-10-CM | POA: Diagnosis not present

## 2018-09-09 DIAGNOSIS — Z23 Encounter for immunization: Secondary | ICD-10-CM | POA: Diagnosis not present

## 2018-09-09 DIAGNOSIS — N183 Chronic kidney disease, stage 3 (moderate): Secondary | ICD-10-CM | POA: Diagnosis not present

## 2018-11-23 DIAGNOSIS — N3281 Overactive bladder: Secondary | ICD-10-CM | POA: Diagnosis not present

## 2018-11-23 DIAGNOSIS — R319 Hematuria, unspecified: Secondary | ICD-10-CM | POA: Diagnosis not present

## 2018-11-23 DIAGNOSIS — R809 Proteinuria, unspecified: Secondary | ICD-10-CM | POA: Diagnosis not present

## 2018-11-23 DIAGNOSIS — R300 Dysuria: Secondary | ICD-10-CM | POA: Diagnosis not present

## 2019-02-03 DIAGNOSIS — H353124 Nonexudative age-related macular degeneration, left eye, advanced atrophic with subfoveal involvement: Secondary | ICD-10-CM | POA: Diagnosis not present

## 2019-02-03 DIAGNOSIS — Z961 Presence of intraocular lens: Secondary | ICD-10-CM | POA: Diagnosis not present

## 2019-02-03 DIAGNOSIS — H52203 Unspecified astigmatism, bilateral: Secondary | ICD-10-CM | POA: Diagnosis not present

## 2019-02-03 DIAGNOSIS — H353112 Nonexudative age-related macular degeneration, right eye, intermediate dry stage: Secondary | ICD-10-CM | POA: Diagnosis not present

## 2019-02-03 DIAGNOSIS — H35033 Hypertensive retinopathy, bilateral: Secondary | ICD-10-CM | POA: Diagnosis not present

## 2019-02-05 DIAGNOSIS — J449 Chronic obstructive pulmonary disease, unspecified: Secondary | ICD-10-CM | POA: Diagnosis not present

## 2019-02-05 DIAGNOSIS — I10 Essential (primary) hypertension: Secondary | ICD-10-CM | POA: Diagnosis not present

## 2019-02-05 DIAGNOSIS — R946 Abnormal results of thyroid function studies: Secondary | ICD-10-CM | POA: Diagnosis not present

## 2019-02-05 DIAGNOSIS — N183 Chronic kidney disease, stage 3 (moderate): Secondary | ICD-10-CM | POA: Diagnosis not present

## 2019-02-05 DIAGNOSIS — E782 Mixed hyperlipidemia: Secondary | ICD-10-CM | POA: Diagnosis not present

## 2019-02-10 DIAGNOSIS — I10 Essential (primary) hypertension: Secondary | ICD-10-CM | POA: Diagnosis not present

## 2019-02-10 DIAGNOSIS — E782 Mixed hyperlipidemia: Secondary | ICD-10-CM | POA: Diagnosis not present

## 2019-02-10 DIAGNOSIS — Z6835 Body mass index (BMI) 35.0-35.9, adult: Secondary | ICD-10-CM | POA: Diagnosis not present

## 2019-02-10 DIAGNOSIS — Z0000 Encounter for general adult medical examination without abnormal findings: Secondary | ICD-10-CM | POA: Diagnosis not present

## 2019-08-27 ENCOUNTER — Encounter (INDEPENDENT_AMBULATORY_CARE_PROVIDER_SITE_OTHER): Payer: Medicare HMO | Admitting: Ophthalmology

## 2019-09-09 ENCOUNTER — Other Ambulatory Visit: Payer: Self-pay

## 2019-09-09 ENCOUNTER — Encounter (INDEPENDENT_AMBULATORY_CARE_PROVIDER_SITE_OTHER): Payer: Medicare HMO | Admitting: Ophthalmology

## 2019-09-09 ENCOUNTER — Encounter (INDEPENDENT_AMBULATORY_CARE_PROVIDER_SITE_OTHER): Payer: Self-pay

## 2022-03-24 ENCOUNTER — Inpatient Hospital Stay (HOSPITAL_COMMUNITY)
Admission: AD | Admit: 2022-03-24 | Discharge: 2022-03-27 | DRG: 281 | Disposition: A | Discharge status: Home / Self Care | Payer: Medicare HMO | Source: Other Acute Inpatient Hospital | Attending: Internal Medicine | Admitting: Internal Medicine

## 2022-03-24 ENCOUNTER — Inpatient Hospital Stay (HOSPITAL_COMMUNITY): Payer: Medicare HMO

## 2022-03-24 DIAGNOSIS — Z7982 Long term (current) use of aspirin: Secondary | ICD-10-CM | POA: Diagnosis not present

## 2022-03-24 DIAGNOSIS — I5032 Chronic diastolic (congestive) heart failure: Secondary | ICD-10-CM | POA: Diagnosis present

## 2022-03-24 DIAGNOSIS — I214 Non-ST elevation (NSTEMI) myocardial infarction: Secondary | ICD-10-CM | POA: Diagnosis present

## 2022-03-24 DIAGNOSIS — Z6834 Body mass index (BMI) 34.0-34.9, adult: Secondary | ICD-10-CM | POA: Diagnosis not present

## 2022-03-24 DIAGNOSIS — Z7951 Long term (current) use of inhaled steroids: Secondary | ICD-10-CM

## 2022-03-24 DIAGNOSIS — I4892 Unspecified atrial flutter: Secondary | ICD-10-CM | POA: Diagnosis present

## 2022-03-24 DIAGNOSIS — Z87891 Personal history of nicotine dependence: Secondary | ICD-10-CM

## 2022-03-24 DIAGNOSIS — I25110 Atherosclerotic heart disease of native coronary artery with unstable angina pectoris: Secondary | ICD-10-CM | POA: Diagnosis present

## 2022-03-24 DIAGNOSIS — I110 Hypertensive heart disease with heart failure: Secondary | ICD-10-CM | POA: Diagnosis present

## 2022-03-24 DIAGNOSIS — Z79899 Other long term (current) drug therapy: Secondary | ICD-10-CM | POA: Diagnosis not present

## 2022-03-24 DIAGNOSIS — E860 Dehydration: Secondary | ICD-10-CM | POA: Diagnosis present

## 2022-03-24 DIAGNOSIS — I4891 Unspecified atrial fibrillation: Secondary | ICD-10-CM | POA: Diagnosis present

## 2022-03-24 DIAGNOSIS — E7800 Pure hypercholesterolemia, unspecified: Secondary | ICD-10-CM | POA: Diagnosis present

## 2022-03-24 DIAGNOSIS — R079 Chest pain, unspecified: Secondary | ICD-10-CM | POA: Diagnosis present

## 2022-03-24 DIAGNOSIS — D72829 Elevated white blood cell count, unspecified: Secondary | ICD-10-CM | POA: Diagnosis not present

## 2022-03-24 DIAGNOSIS — N400 Enlarged prostate without lower urinary tract symptoms: Secondary | ICD-10-CM | POA: Diagnosis present

## 2022-03-24 DIAGNOSIS — M109 Gout, unspecified: Secondary | ICD-10-CM | POA: Diagnosis present

## 2022-03-24 DIAGNOSIS — J449 Chronic obstructive pulmonary disease, unspecified: Secondary | ICD-10-CM | POA: Diagnosis present

## 2022-03-24 DIAGNOSIS — I483 Typical atrial flutter: Secondary | ICD-10-CM

## 2022-03-24 DIAGNOSIS — E872 Acidosis, unspecified: Secondary | ICD-10-CM | POA: Diagnosis present

## 2022-03-24 DIAGNOSIS — K219 Gastro-esophageal reflux disease without esophagitis: Secondary | ICD-10-CM | POA: Diagnosis present

## 2022-03-24 DIAGNOSIS — I10 Essential (primary) hypertension: Secondary | ICD-10-CM

## 2022-03-24 DIAGNOSIS — E669 Obesity, unspecified: Secondary | ICD-10-CM | POA: Diagnosis present

## 2022-03-24 DIAGNOSIS — R319 Hematuria, unspecified: Secondary | ICD-10-CM | POA: Diagnosis present

## 2022-03-24 DIAGNOSIS — Z809 Family history of malignant neoplasm, unspecified: Secondary | ICD-10-CM

## 2022-03-24 LAB — CBC
HCT: 47.3 % (ref 39.0–52.0)
Hemoglobin: 15.2 g/dL (ref 13.0–17.0)
MCH: 29.9 pg (ref 26.0–34.0)
MCHC: 32.1 g/dL (ref 30.0–36.0)
MCV: 93.1 fL (ref 80.0–100.0)
Platelets: 284 10*3/uL (ref 150–400)
RBC: 5.08 MIL/uL (ref 4.22–5.81)
RDW: 15 % (ref 11.5–15.5)
WBC: 20.3 10*3/uL — ABNORMAL HIGH (ref 4.0–10.5)
nRBC: 0 % (ref 0.0–0.2)

## 2022-03-24 LAB — URINALYSIS, ROUTINE W REFLEX MICROSCOPIC
Bilirubin Urine: NEGATIVE
Glucose, UA: NEGATIVE mg/dL
Ketones, ur: NEGATIVE mg/dL
Leukocytes,Ua: NEGATIVE
Nitrite: NEGATIVE
Protein, ur: 30 mg/dL — AB
RBC / HPF: >50 RBC/hpf — ABNORMAL HIGH (ref 0–5)
Specific Gravity, Urine: 1.029 (ref 1.005–1.030)
WBC, UA: >50 WBC/hpf — ABNORMAL HIGH (ref 0–5)
pH: 5 (ref 5.0–8.0)

## 2022-03-24 LAB — BASIC METABOLIC PANEL
Anion gap: 12 (ref 5–15)
BUN: 25 mg/dL — ABNORMAL HIGH (ref 8–23)
CO2: 24 mmol/L (ref 22–32)
Calcium: 8.5 mg/dL — ABNORMAL LOW (ref 8.9–10.3)
Chloride: 105 mmol/L (ref 98–111)
Creatinine, Ser: 1.86 mg/dL — ABNORMAL HIGH (ref 0.61–1.24)
GFR, Estimated: 35 mL/min — ABNORMAL LOW (ref >60)
Glucose, Bld: 129 mg/dL — ABNORMAL HIGH (ref 70–99)
Potassium: 3.6 mmol/L (ref 3.5–5.1)
Sodium: 141 mmol/L (ref 135–145)

## 2022-03-24 LAB — LACTIC ACID, PLASMA
Lactic Acid, Venous: 2 mmol/L (ref 0.5–1.9)
Lactic Acid, Venous: 2.1 mmol/L (ref 0.5–1.9)

## 2022-03-24 LAB — TROPONIN I (HIGH SENSITIVITY)
Troponin I (High Sensitivity): 689 ng/L (ref <18)
Troponin I (High Sensitivity): 507 ng/L (ref <18)

## 2022-03-24 LAB — HEMOGLOBIN A1C
Hgb A1c MFr Bld: 5.9 % — ABNORMAL HIGH (ref 4.8–5.6)
Mean Plasma Glucose: 122.63 mg/dL

## 2022-03-24 LAB — HEPARIN LEVEL (UNFRACTIONATED): Heparin Unfractionated: 0.12 IU/mL — ABNORMAL LOW (ref 0.30–0.70)

## 2022-03-24 MED ORDER — DIGOXIN 0.25 MG/ML IJ SOLN
0.1250 mg | Freq: Four times a day (QID) | INTRAMUSCULAR | Status: DC
  Administered 2022-03-24: 0.125 mg via INTRAVENOUS
  Filled 2022-03-24 (×3): qty 0.5

## 2022-03-24 MED ORDER — LISINOPRIL-HYDROCHLOROTHIAZIDE 20-25 MG PO TABS: 1.0000 | ORAL_TABLET | Freq: Every day | ORAL | Status: DC

## 2022-03-24 MED ORDER — TAMSULOSIN HCL 0.4 MG PO CAPS
0.4000 mg | ORAL_CAPSULE | Freq: Every day | ORAL | Status: DC
  Administered 2022-03-24 – 2022-03-26 (×3): 0.4 mg via ORAL
  Filled 2022-03-24 (×3): qty 1

## 2022-03-24 MED ORDER — FUROSEMIDE 10 MG/ML IJ SOLN
40.0000 mg | Freq: Once | INTRAMUSCULAR | Status: AC
  Administered 2022-03-24: 40 mg via INTRAVENOUS
  Filled 2022-03-24: qty 4

## 2022-03-24 MED ORDER — ATENOLOL 50 MG PO TABS
50.0000 mg | ORAL_TABLET | Freq: Every day | ORAL | Status: DC
  Administered 2022-03-24 – 2022-03-26 (×3): 50 mg via ORAL
  Filled 2022-03-24 (×4): qty 1

## 2022-03-24 MED ORDER — ACETAMINOPHEN 325 MG PO TABS: 650.0000 mg | ORAL_TABLET | ORAL | Status: DC | PRN

## 2022-03-24 MED ORDER — HEPARIN (PORCINE) 25000 UT/250ML-% IV SOLN
1400.0000 [IU]/h | INTRAVENOUS | Status: DC
  Administered 2022-03-24: 1100 [IU]/h via INTRAVENOUS
  Administered 2022-03-25 – 2022-03-26 (×2): 1300 [IU]/h via INTRAVENOUS
  Filled 2022-03-24 (×3): qty 250

## 2022-03-24 MED ORDER — NITROGLYCERIN 0.4 MG SL SUBL: 0.4000 mg | SUBLINGUAL_TABLET | SUBLINGUAL | Status: DC | PRN

## 2022-03-24 MED ORDER — METOPROLOL TARTRATE 5 MG/5ML IV SOLN
2.5000 mg | Freq: Once | INTRAVENOUS | Status: AC
  Administered 2022-03-24: 2.5 mg via INTRAVENOUS
  Filled 2022-03-24: qty 5

## 2022-03-24 MED ORDER — ATORVASTATIN CALCIUM 40 MG PO TABS
40.0000 mg | ORAL_TABLET | Freq: Every day | ORAL | Status: DC
  Administered 2022-03-24 – 2022-03-27 (×4): 40 mg via ORAL
  Filled 2022-03-24 (×4): qty 1

## 2022-03-24 MED ORDER — LISINOPRIL 5 MG PO TABS
5.0000 mg | ORAL_TABLET | Freq: Every day | ORAL | Status: DC
  Administered 2022-03-24: 5 mg via ORAL
  Filled 2022-03-24: qty 1

## 2022-03-24 MED ORDER — ASPIRIN 81 MG PO CHEW
324.0000 mg | CHEWABLE_TABLET | ORAL | Status: AC
  Administered 2022-03-24: 324 mg via ORAL
  Filled 2022-03-24: qty 4

## 2022-03-24 MED ORDER — DILTIAZEM HCL-DEXTROSE 125-5 MG/125ML-% IV SOLN (PREMIX)
5.0000 mg/h | INTRAVENOUS | Status: DC
  Administered 2022-03-24: 5 mg/h via INTRAVENOUS
  Administered 2022-03-25: 15 mg/h via INTRAVENOUS
  Filled 2022-03-24 (×2): qty 125

## 2022-03-24 MED ORDER — ALLOPURINOL 300 MG PO TABS
300.0000 mg | ORAL_TABLET | Freq: Every day | ORAL | Status: DC
  Administered 2022-03-25 – 2022-03-26 (×2): 300 mg via ORAL
  Filled 2022-03-24 (×2): qty 1

## 2022-03-24 MED ORDER — ASPIRIN 81 MG PO TABS: 81.0000 mg | ORAL_TABLET | ORAL | Status: DC

## 2022-03-24 MED ORDER — METOPROLOL TARTRATE 5 MG/5ML IV SOLN
2.5000 mg | Freq: Four times a day (QID) | INTRAVENOUS | Status: DC | PRN
Start: 2022-03-24 — End: 2022-03-24

## 2022-03-24 MED ORDER — ASPIRIN 300 MG RE SUPP
300.0000 mg | RECTAL | Status: AC
  Filled 2022-03-24: qty 1

## 2022-03-24 MED ORDER — MOMETASONE FURO-FORMOTEROL FUM 200-5 MCG/ACT IN AERO
2.0000 | INHALATION_SPRAY | Freq: Two times a day (BID) | RESPIRATORY_TRACT | Status: DC
  Administered 2022-03-24 – 2022-03-27 (×6): 2 via RESPIRATORY_TRACT
  Filled 2022-03-24: qty 8.8

## 2022-03-24 MED ORDER — ASPIRIN EC 81 MG PO TBEC
81.0000 mg | DELAYED_RELEASE_TABLET | Freq: Every day | ORAL | Status: DC
  Administered 2022-03-25 – 2022-03-27 (×3): 81 mg via ORAL
  Filled 2022-03-24 (×3): qty 1

## 2022-03-24 MED ORDER — HEPARIN BOLUS VIA INFUSION
2000.0000 [IU] | Freq: Once | INTRAVENOUS | Status: AC
  Administered 2022-03-24: 2000 [IU] via INTRAVENOUS
  Filled 2022-03-24: qty 2000

## 2022-03-24 MED ORDER — ONDANSETRON HCL 4 MG/2ML IJ SOLN
4.0000 mg | Freq: Four times a day (QID) | INTRAMUSCULAR | Status: DC | PRN
  Administered 2022-03-24: 4 mg via INTRAVENOUS
  Filled 2022-03-24: qty 2

## 2022-03-24 NOTE — Progress Notes (Signed)
Pt up to use BSC, pt called RN to report bleeding from penis.  Small/moderate amount of blood noted.  Pt denies pain or trauma to site.  Heparin continues to infuse at 13cc/hr.  R upper arm noted to be bleeding as well, infiltrated.  IV removed, cath tip intact.  Pressure held and dressing applied.  Heparin pharmacist paged to notify of bleeding.  Waiting return call. ?

## 2022-03-24 NOTE — Progress Notes (Signed)
?   03/24/22 1830  ?Vitals  ?Temp 98 ?F (36.7 ?C)  ?Temp Source Oral  ?BP 137/89  ?MAP (mmHg) 104  ?BP Location Right Arm  ?BP Method Automatic  ?Patient Position (if appropriate) Lying  ?Pulse Rate (!) 130  ?Pulse Rate Source Monitor  ?ECG Heart Rate (!) 130  ?Resp 20  ?Level of Consciousness  ?Level of Consciousness Alert  ?Oxygen Therapy  ?SpO2 94 %  ?O2 Device Nasal Cannula  ?O2 Flow Rate (L/min) 2 L/min  ?Glasgow Coma Scale  ?Eye Opening 4  ?Best Verbal Response (NON-intubated) 5  ?Best Motor Response 6  ?Glasgow Coma Scale Score 15  ? ? ?

## 2022-03-24 NOTE — Progress Notes (Addendum)
ANTICOAGULATION CONSULT NOTE - Initial Consult ? ?Pharmacy Consult for heparin ?Indication: chest pain/ACS ? ?No Known Allergies ? ?Patient Measurements: ?Height: 5\' 10"  (177.8 cm) ?Weight: 108.9 kg (240 lb) (per pt report) ?IBW/kg (Calculated) : 73 ?Heparin Dosing Weight: 97 kg ? ?Vital Signs: ?Temp: 98 ?F (36.7 ?C) (03/26 1603) ?Temp Source: Oral (03/26 1603) ?BP: 143/97 (03/26 1603) ?Pulse Rate: 128 (03/26 1603) ? ?Labs: ?Recent Labs  ?  03/24/22 ?1305 03/24/22 ?1447  ?HEPARINUNFRC  --  0.12*  ?TROPONINIHS 689* 507*  ? ? ?CrCl cannot be calculated (Patient's most recent lab result is older than the maximum 21 days allowed.). ? ? ?Medical History: ?Past Medical History:  ?Diagnosis Date  ? BPH (benign prostatic hyperplasia)   ? COPD (chronic obstructive pulmonary disease) (HCC)   ? Gout   ? "left big toe"  ? HOH (hard of hearing)   ? Hypercholesterolemia   ? Hypertension   ? Preretinal fibrosis, left eye   ? Spinal headache 1974  ? Stone, kidney   ? "never had OR" (07/18/2015)  ? ?  ?Assessment: ?88 yo M on heparin from outside hospital for NSTEMI and Afib RVR. No known anticoagulation prior to admission per chart and RX fill review. Pharmacy consulted for heparin.   ? ?Heparin level 0.12 is subtherapeutic on 1100 units/hr. Per OSH labs, Hgb 15.5, Pltc 297. Weight entered per patient report, currently too weak to stand. RN to obtain standing weight as able.  No issues with infusion or bleeding per RN.  ? ?Goal of Therapy:  ?Heparin level 0.3-0.7 units/ml ?Monitor platelets by anticoagulation protocol: Yes ?  ?Plan:  ?Heparin bolus 2000 units x1 then increase heparin to 1300 units/hr  ?F/u 6hr HL  ?Monitor daily heparin level, CBC ?Monitor for signs/symptoms of bleeding  ? ? ? ?89, PharmD, BCPS, BCCP ?Clinical Pharmacist ? ?Please check AMION for all [facility] Pharmacy phone numbers ?After 10:00 PM, call Main Pharmacy [phone] ? ?

## 2022-03-24 NOTE — Progress Notes (Signed)
Pt is getting Cardizem gtt and he is maxed out at 15 mg/hr, HR is between 105-132. Pt was seen by cardiologist earlier. Dr Chipper Herb was paged several times regarding pt's status. Pt is stable, denies chest pain, SOB and discomfort. He is comfortably resting in bed with oxygen 2l. Wife is in bed side and updated, will continue to monitor the pt ? ?Lonia Farber, RN ?

## 2022-03-24 NOTE — Progress Notes (Signed)
Date and time results received: 03/24/22 , 1500 ? ?Test: troponin and Lactid acid ?Critical Value: troponin 689 and Lactid acid 2.0 ? ?Name of Provider Notified: Mikey College MD ? ?Orders Received? Or Actions Taken?: No new orders. ? ?Lonia Farber, RN ?

## 2022-03-24 NOTE — H&P (Signed)
?History and Physical  ? ? ?Timothy Mata KDX:[id number] DOB: 1933-10-01 DOA: 03/24/2022 ? ?PCP: Royann Shivers, PA-C (Confirm with patient/family/NH records and if not entered, this has to be entered at [facility] point of entry) ?Patient coming from: Home ? ?I have personally briefly reviewed patient's old medical records in [facility] ? ?Chief Complaint: Chest pain, SOB ? ?HPI: Timothy Mata is a 88 y.o. male with medical history significant of HTN, COPD, Gout, HLD, BPH came with new onset of chest pain and shortness of breath.. ? ?Symptoms started 3 to 4 days ago, intermittent pressure-like chest pain, centrally located, associated with increasing exertional dyspnea.  No cough.  Chest pain has been episodic, worsening with exertion, improved with resting.  Yesterday, the patient developed another episode of central chest pain, associated with strong feeling of nausea but no vomiting and came to ED.  At the ED, patient started to have diffuse abdominal pain, tachycardia and monitor showed A-fib.  Blood work showed elevated troponin 163> 134.  Heart rate improved with 2 L of IV bolus.  WBC 17.3, normal hemoglobin, lactic acid 3.3 procalcitonin negative.  UA showed hematuria.  CT chest/abdomen/pelvis no significant acute finding.  Cardiology Dr. Venetia Constable at [facility] was contacted and recommended patient started on non-STEMI protocol with heparin drip.  And transferred for concern for more work-up. ? ?Patient currently has no chest pains still feeling shortness of breath and on 2L.  No more feeling of nauseous denies any abdominal pains. ? ?Review of Systems: As per HPI otherwise 14 point review of systems negative.  ? ? ?Past Medical History:  ?Diagnosis Date  ? BPH (benign prostatic hyperplasia)   ? COPD (chronic obstructive pulmonary disease) (HCC)   ? Gout   ? "left big toe"  ? HOH (hard of hearing)   ? Hypercholesterolemia   ? Hypertension   ? Preretinal fibrosis, left eye   ? Spinal headache 1974  ? Stone,  kidney   ? "never had OR" (07/18/2015)  ? ? ?Past Surgical History:  ?Procedure Laterality Date  ? 25 GAUGE PARS PLANA VITRECTOMY WITH 20 GAUGE MVR PORT Left 07/18/2015  ? Procedure: 25 GAUGE PARS PLANA VITRECTOMY WITH 20 GAUGE MVR PORT;  Surgeon: Sherrie George, MD;  Location: [facility] OR;  Service: Ophthalmology;  Laterality: Left;  ? AIR/FLUID EXCHANGE Left 07/18/2015  ? Procedure: AIR/FLUID EXCHANGE;  Surgeon: Sherrie George, MD;  Location: [facility] OR;  Service: Ophthalmology;  Laterality: Left;  ? BACK SURGERY    ? EYE SURGERY    ? FOOT FRACTURE SURGERY Right 1981  ? LASER PHOTO ABLATION Left 07/18/2015  ? Procedure: LASER PHOTO ABLATION;  Surgeon: Sherrie George, MD;  Location: [facility] OR;  Service: Ophthalmology;  Laterality: Left;  head scope ?  ? LUMBAR DISC SURGERY  1974  ? MEMBRANE PEEL Left 07/18/2015  ? Procedure: MEMBRANE PEEL;  Surgeon: Sherrie George, MD;  Location: [facility] OR;  Service: Ophthalmology;  Laterality: Left;  ? MULTIPLE TOOTH EXTRACTIONS  1990's  ? PARS PLANA VITRECTOMY Left 07/18/2015  ? ? ? reports that he has quit smoking. His smoking use included cigarettes. He has a 32.00 pack-year smoking history. He has never used smokeless tobacco. He reports that he does not drink alcohol and does not use drugs. ? ?No Known Allergies ? ?Family History  ?Problem Relation Age of Onset  ? Cancer Father   ? ? ? ?Prior to Admission medications   ?Medication Sig Start Date End Date Taking? Authorizing Provider  ?  allopurinol (ZYLOPRIM) 300 MG tablet Take 300 mg by mouth daily after supper.  06/12/15   [provider]  ?aspirin 81 MG tablet Take 81 mg by mouth every other day.    [provider]  ?atenolol (TENORMIN) 50 MG tablet Take 50 mg by mouth daily after supper.  05/06/15   [provider]  ?bacitracin-polymyxin b (POLYSPORIN) ophthalmic ointment Place 1 application into the left eye 3 (three) times daily. apply to eye every 12 hours while awake 07/19/15   Sherrie George, MD   ?Fluticasone-Salmeterol (ADVAIR) 250-50 MCG/DOSE AEPB Inhale 1 puff into the lungs 2 (two) times daily.    [provider]  ?gatifloxacin (ZYMAXID) 0.5 % SOLN Place 1 drop into the left eye 4 (four) times daily. 07/19/15   Sherrie George, MD  ?lisinopril-hydrochlorothiazide (PRINZIDE,ZESTORETIC) 20-25 MG per tablet Take 1 tablet by mouth daily. 05/06/15   [provider]  ?Multiple Vitamins-Minerals (PRESERVISION AREDS 2) CAPS Take 1 capsule by mouth 2 (two) times daily.    [provider]  ?prednisoLONE acetate (PRED FORTE) 1 % ophthalmic suspension Place 1 drop into the left eye 4 (four) times daily. 07/19/15   Sherrie George, MD  ?simvastatin (ZOCOR) 40 MG tablet Take 40 mg by mouth daily after supper.  05/06/15   [provider]  ?tamsulosin (FLOMAX) 0.4 MG CAPS capsule Take 0.4 mg by mouth daily after supper.  05/06/15   [provider]  ? ? ?Physical Exam: ?Vitals:  ? 03/24/22 1235  ?BP: (!) 125/98  ?Pulse: (!) 127  ?Resp: 20  ?Temp: 98 ?F (36.7 ?C)  ?TempSrc: Oral  ?SpO2: 100%  ? ? ?Constitutional: NAD, calm, comfortable ?Vitals:  ? 03/24/22 1235  ?BP: (!) 125/98  ?Pulse: (!) 127  ?Resp: 20  ?Temp: 98 ?F (36.7 ?C)  ?TempSrc: Oral  ?SpO2: 100%  ? ?Eyes: PERRL, lids and conjunctivae normal ?ENMT: Mucous membranes are moist. Posterior pharynx clear of any exudate or lesions.Normal dentition.  ?Neck: normal, supple, no masses, no thyromegaly ?Respiratory: clear to auscultation bilaterally, no wheezing, fine crackles in bilateral bases, increasing breathing effort, talking in broken sentences. No accessory muscle use.  ?Cardiovascular: Irregular heart rate, no murmurs / rubs / gallops. No extremity edema. 2+ pedal pulses. No carotid bruits.  ?Abdomen: no tenderness, no masses palpated. No hepatosplenomegaly. Bowel sounds positive.  ?Musculoskeletal: no clubbing / cyanosis. No joint deformity upper and lower extremities. Good ROM, no contractures. Normal muscle tone.   ?Skin: no rashes, lesions, ulcers. No induration ?Neurologic: CN 2-12 grossly intact. Sensation intact, DTR normal. Strength 5/5 in all 4.  ?Psychiatric: Normal judgment and insight. Alert and oriented x 3. Normal mood.  ? ? ?Labs on Admission: I have personally reviewed following labs and imaging studies ? ?CBC: ?No results for input(s): WBC, NEUTROABS, HGB, HCT, MCV, PLT in the last 168 hours. ?Basic Metabolic Panel: ?No results for input(s): NA, K, CL, CO2, GLUCOSE, BUN, CREATININE, CALCIUM, MG, PHOS in the last 168 hours. ?GFR: ?CrCl cannot be calculated (Patient's most recent lab result is older than the maximum 21 days allowed.). ?Liver Function Tests: ?No results for input(s): AST, ALT, ALKPHOS, BILITOT, PROT, ALBUMIN in the last 168 hours. ?No results for input(s): LIPASE, AMYLASE in the last 168 hours. ?No results for input(s): AMMONIA in the last 168 hours. ?Coagulation Profile: ?No results for input(s): INR, PROTIME in the last 168 hours. ?Cardiac Enzymes: ?No results for input(s): CKTOTAL, CKMB, CKMBINDEX, TROPONINI in the last 168 hours. ?BNP (last  3 results) ?No results for input(s): PROBNP in the last 8760 hours. ?HbA1C: ?Recent Labs  ?  03/24/22 ?1305  ?HGBA1C 5.9*  ? ?CBG: ?No results for input(s): GLUCAP in the last 168 hours. ?Lipid Profile: ?No results for input(s): CHOL, HDL, LDLCALC, TRIG, CHOLHDL, LDLDIRECT in the last 72 hours. ?Thyroid Function Tests: ?No results for input(s): TSH, T4TOTAL, FREET4, T3FREE, THYROIDAB in the last 72 hours. ?Anemia Panel: ?No results for input(s): VITAMINB12, FOLATE, FERRITIN, TIBC, IRON, RETICCTPCT in the last 72 hours. ?Urine analysis: ?No results found for: COLORURINE, APPEARANCEUR, LABSPEC, PHURINE, GLUCOSEU, HGBUR, BILIRUBINUR, KETONESUR, PROTEINUR, UROBILINOGEN, NITRITE, LEUKOCYTESUR ? ?Radiological Exams on Admission: ?No results found. ? ?EKG: Independently reviewed.  A flutter with variable AV block's.  Diffused ST-T changes more on the chest  leads. ? ?Assessment/Plan ?Principal Problem: ?  NSTEMI (non-ST elevated myocardial infarction) (HCC) ? (please populate well all problems here in Problem List. (For example, if patient is on BP meds at home and you resume or decide to h

## 2022-03-24 NOTE — Consult Note (Addendum)
?Cardiology Consultation:  ? ?Patient ID: Timothy Mata ?MRN: [id number]; DOB: August 21, 1933 ? ?Admit date: 03/24/2022 ?Date of Consult: 03/24/2022 ? ?PCP:  Rosalee Kaufman, PA-C ?  ?[facility] Providers ?Cardiologist:  None   { ? ? ?Patient Profile:  ? ?Timothy Mata is a 88 y.o. male with a hx of hypertension, COPD, hyperlipidemia and BPH who is being seen 03/24/2022 for the evaluation of non-STEMI/atrial fibrillation at the request of Dr. Roosevelt Locks.  ? ?History of Present Illness:  ? ?Mr. Uhde reported 39-months history of intermittent chest discomfort.  Felt like GERD which helped with belching.  About a week ago he had a severe substernal chest pressure with shortness of breath and diaphoresis while helping his friend getting worse.  Symptoms lasted for few hours with resolved vision.  Yesterday had substernal chest pressure with rest.  It was associated with shortness of breath. Monitor showed atrial fibrillation/flutter with rapid ventricular rate.  Troponin 163>> 134.  Given fluids with improved heart rate.  Elevated lactic acid.  UA is showing hematuria.  Case discussed with fellow cardiology who recommended transfer under internal medicine team admission for further evaluation. ? ?Upon arrival to [facility] patient is ?Lactic acid 2.0 ?High-sensitivity troponin 689 ?Continued IV heparin ?Pending echocardiogram ?Started on IV digoxin ? ?Past Medical History:  ?Diagnosis Date  ? BPH (benign prostatic hyperplasia)   ? COPD (chronic obstructive pulmonary disease) ([place])   ? Gout   ? "left big toe"  ? HOH (hard of hearing)   ? Hypercholesterolemia   ? Hypertension   ? Preretinal fibrosis, left eye   ? Spinal headache 1974  ? Stone, kidney   ? "never had OR" (07/18/2015)  ? ? ?Past Surgical History:  ?Procedure Laterality Date  ? 25 GAUGE PARS PLANA VITRECTOMY WITH 20 GAUGE MVR PORT Left 07/18/2015  ? Procedure: 25 GAUGE PARS PLANA VITRECTOMY WITH 20 GAUGE MVR PORT;  Surgeon: Hayden Pedro, MD;  Location:  [place];  Service: Ophthalmology;  Laterality: Left;  ? AIR/FLUID EXCHANGE Left 07/18/2015  ? Procedure: AIR/FLUID EXCHANGE;  Surgeon: Hayden Pedro, MD;  Location: [place];  Service: Ophthalmology;  Laterality: Left;  ? BACK SURGERY    ? EYE SURGERY    ? FOOT FRACTURE SURGERY Right 1981  ? LASER PHOTO ABLATION Left 07/18/2015  ? Procedure: LASER PHOTO ABLATION;  Surgeon: Hayden Pedro, MD;  Location: [place];  Service: Ophthalmology;  Laterality: Left;  head scope ?  ? [facility] SURGERY  1974  ? MEMBRANE PEEL Left 07/18/2015  ? Procedure: MEMBRANE PEEL;  Surgeon: Hayden Pedro, MD;  Location: [place];  Service: Ophthalmology;  Laterality: Left;  ? MULTIPLE TOOTH EXTRACTIONS  1990's  ? PARS PLANA VITRECTOMY Left 07/18/2015  ? ? ?Inpatient Medications: ?Scheduled Meds: ? [START ON 03/25/2022] allopurinol  300 mg Oral QPC supper  ? [START ON 03/25/2022] aspirin EC  81 mg Oral Daily  ? atenolol  50 mg Oral QPC supper  ? atorvastatin  40 mg Oral Daily  ? digoxin  0.125 mg Intravenous Q6H  ? lisinopril  5 mg Oral Daily  ? mometasone-formoterol  2 puff Inhalation BID  ? tamsulosin  0.4 mg Oral QPC supper  ? ?Continuous Infusions: ? heparin 1,300 Units/hr (03/24/22 1640)  ? ?PRN Meds: ?acetaminophen, metoprolol tartrate, nitroGLYCERIN, ondansetron (ZOFRAN) IV ? ?Allergies:   No Known Allergies ? ?Social History:   ?Social History  ? ?Socioeconomic History  ? Marital status: Married  ?  Spouse name:  Not on file  ? Number of children: Not on file  ? Years of education: Not on file  ? Highest education level: Not on file  ?Occupational History  ? Not on file  ?Tobacco Use  ? Smoking status: Former  ?  Packs/day: 1.00  ?  Years: 32.00  ?  Pack years: 32.00  ?  Types: Cigarettes  ? Smokeless tobacco: Never  ? Tobacco comments:  ?  quit smoking at age 63  ?Substance and Sexual Activity  ? Alcohol use: No  ? Drug use: No  ? Sexual activity: Not on file  ?Other Topics Concern  ? Not on file  ?Social History Narrative  ? Not on file   ? ?Social Determinants of Health  ? ?Financial Resource Strain: Not on file  ?Food Insecurity: Not on file  ?Transportation Needs: Not on file  ?Physical Activity: Not on file  ?Stress: Not on file  ?Social Connections: Not on file  ?Intimate Partner Violence: Not on file  ?  ?Family History:   ? ?Family History  ?Problem Relation Age of Onset  ? Cancer Father   ?  ? ?ROS:  ?Please see the history of present illness.  ?All other ROS reviewed and negative.    ? ?Physical Exam/Data:  ? ?Vitals:  ? 03/24/22 1235 03/24/22 1400 03/24/22 1603  ?BP: (!) 125/98 (!) 138/113 (!) 143/97  ?Pulse: (!) 127 (!) 126 (!) 128  ?Resp: 20 (!) 24 20  ?Temp: 98 ?F (36.7 ?C)  98 ?F (36.7 ?C)  ?TempSrc: Oral  Oral  ?SpO2: 100% 100% 95%  ?Weight:   108.9 kg  ?Height:   5\' 10"  (1.778 m)  ? ? ?Intake/Output Summary (Last 24 hours) at 03/24/2022 1715 ?Last data filed at 03/24/2022 1641 ?Gross per 24 hour  ?Intake --  ?Output 700 ml  ?Net -700 ml  ? ? ?  03/24/2022  ?  4:03 PM 07/18/2015  ?  1:56 PM 07/18/2015  ?  9:35 AM  ?Last 3 Weights  ?Weight (lbs) 240 lb 236 lb 236 lb  ?Weight (kg) 108.863 kg 107.049 kg 107.049 kg  ?   ?Body mass index is 34.44 kg/m?.  ?General:  Well nourished, well developed, in no acute distress ?HEENT: normal ?Neck: no JVD ?Vascular: No carotid bruits; Distal pulses 2+ bilaterally ?Cardiac:  normal S1, S2; irregular tachycardic; no murmur  ?Lungs:  clear to auscultation bilaterally, no wheezing, rhonchi or rales  ?Abd: soft, nontender, no hepatomegaly  ?Ext: no edema ?Musculoskeletal:  No deformities, BUE and BLE strength normal and equal ?Skin: warm and dry  ?Neuro:  CNs 2-12 intact, no focal abnormalities noted ?Psych:  Normal affect  ? ?EKG:  The EKG was personally reviewed and demonstrates: Atrial flutter@variable  rate ?Telemetry:  Telemetry was personally reviewed and demonstrates: Atrial flutter with rapid ventricular rate ? ?Relevant CV Studies: ?Summarized above ? ?Laboratory Data: ? ?High Sensitivity Troponin:    ?Recent Labs  ?Lab 03/24/22 ?1305 03/24/22 ?1447  ?TROPONINIHS 689* 507*  ?   ?Reviewed outside hospital records ? ?Radiology/Studies:  ?DG Chest Port 1 View ? ?Result Date: 03/24/2022 ?CLINICAL DATA:  Chest pain. EXAM: PORTABLE CHEST 1 VIEW COMPARISON:  None. FINDINGS: Cardiac silhouette is normal in size. No mediastinal or hilar masses. Hazy opacity in the perihilar and lower lungs. Mild bilateral interstitial thickening. No convincing pleural effusion.  No pneumothorax. Skeletal structures are grossly intact. IMPRESSION: 1. Mild bilateral interstitial thickening with hazy central airspace opacities suspected to be due to pulmonary  edema. Consider infection if there are consistent clinical findings. Electronically Signed   By: Lajean Manes M.D.   On: 03/24/2022 15:40   ? ? ?Assessment and Plan:  ? ?New onset atrial flutter with rapid ventricular rate ?-No palpitation but chest pain with shortness of breath.  He was given IV digoxin here. ?-We will start IV Cardizem for rate control ?-Continue IV heparin for anticoagulation ?-Check TSH ?-Pending echocardiogram ?-Continue atenolol 50 mg daily ? ?2.  Hypertension ?-Start Cardizem as above ?-Continue BB ?-Hold lisinopril to uptitrate rate control agent ? ?3.  Non-STEMI ?-History of chest pressure with shortness of breath.  Troponin trending up. 507>>689.  ?-Continue IV heparin ?-N.p.o. after midnight to decide ischemic evaluation. ? ? ?Risk Assessment/Risk Scores:  ?   ?TIMI Risk Score for Unstable Angina or Non-ST Elevation MI:   ?The patient's TIMI risk score is 3, which indicates a 13% risk of all cause mortality, new or recurrent myocardial infarction or need for urgent revascularization in the next 14 days. ?  ? ?CHA2DS2-VASc Score =    Thromboembolic risk factors ( age -45, HTN-1,) for a CHADSVASc Score of >=3 ?  ?  ? ? ?For questions or updates, please contact [place] ?Please consult [email or web address] for contact info under  ? ? ?Signed, ?Leanor Kail,  [place]  ?03/24/2022 5:15 PM  ? ?Atrial flutter-atypical (negative flutter waves inferiorly, negative flutter low wave lead V1) ? ?Congestive heart failure acute/chronic ? ?Non-STEMI ? ?Nausea and indigestion ?

## 2022-03-25 ENCOUNTER — Inpatient Hospital Stay (HOSPITAL_COMMUNITY): Payer: Medicare HMO

## 2022-03-25 ENCOUNTER — Other Ambulatory Visit (HOSPITAL_COMMUNITY): Payer: Self-pay

## 2022-03-25 DIAGNOSIS — I214 Non-ST elevation (NSTEMI) myocardial infarction: Secondary | ICD-10-CM

## 2022-03-25 LAB — CBC
HCT: 44.7 % (ref 39.0–52.0)
Hemoglobin: 14.9 g/dL (ref 13.0–17.0)
MCH: 31.1 pg (ref 26.0–34.0)
MCHC: 33.3 g/dL (ref 30.0–36.0)
MCV: 93.3 fL (ref 80.0–100.0)
Platelets: 257 10*3/uL (ref 150–400)
RBC: 4.79 MIL/uL (ref 4.22–5.81)
RDW: 15 % (ref 11.5–15.5)
WBC: 18.2 10*3/uL — ABNORMAL HIGH (ref 4.0–10.5)
nRBC: 0 % (ref 0.0–0.2)

## 2022-03-25 LAB — ECHOCARDIOGRAM COMPLETE
AR max vel: 1 cm2
AV Area VTI: 0.97 cm2
AV Area mean vel: 0.96 cm2
AV Mean grad: 3 mmHg
AV Peak grad: 5.1 mmHg
Ao pk vel: 1.13 m/s
Area-P 1/2: 4.49 cm2
Calc EF: 60.3 %
Height: 70 in
S' Lateral: 3.1 cm
Single Plane A2C EF: 66.8 %
Single Plane A4C EF: 54.8 %
Weight: 3872 oz

## 2022-03-25 LAB — PROCALCITONIN: Procalcitonin: 0.48 ng/mL

## 2022-03-25 LAB — BASIC METABOLIC PANEL
Anion gap: 10 (ref 5–15)
BUN: 28 mg/dL — ABNORMAL HIGH (ref 8–23)
CO2: 25 mmol/L (ref 22–32)
Calcium: 8.6 mg/dL — ABNORMAL LOW (ref 8.9–10.3)
Chloride: 106 mmol/L (ref 98–111)
Creatinine, Ser: 1.94 mg/dL — ABNORMAL HIGH (ref 0.61–1.24)
GFR, Estimated: 33 mL/min — ABNORMAL LOW (ref >60)
Glucose, Bld: 114 mg/dL — ABNORMAL HIGH (ref 70–99)
Potassium: 3.8 mmol/L (ref 3.5–5.1)
Sodium: 141 mmol/L (ref 135–145)

## 2022-03-25 LAB — LIPID PANEL
Cholesterol: 143 mg/dL (ref 0–200)
HDL: 44 mg/dL (ref >40)
LDL Cholesterol: 76 mg/dL (ref 0–99)
Total CHOL/HDL Ratio: 3.3 RATIO
Triglycerides: 115 mg/dL (ref <150)
VLDL: 23 mg/dL (ref 0–40)

## 2022-03-25 LAB — TROPONIN I (HIGH SENSITIVITY)
Troponin I (High Sensitivity): 531 ng/L (ref <18)
Troponin I (High Sensitivity): 478 ng/L (ref <18)

## 2022-03-25 LAB — HEPARIN LEVEL (UNFRACTIONATED): Heparin Unfractionated: 0.35 IU/mL (ref 0.30–0.70)

## 2022-03-25 LAB — TSH: TSH: 3.307 u[IU]/mL (ref 0.350–4.500)

## 2022-03-25 LAB — LACTIC ACID, PLASMA: Lactic Acid, Venous: 1.4 mmol/L (ref 0.5–1.9)

## 2022-03-25 MED ORDER — DILTIAZEM HCL 60 MG PO TABS
60.0000 mg | ORAL_TABLET | Freq: Four times a day (QID) | ORAL | Status: DC
  Administered 2022-03-25 – 2022-03-26 (×3): 60 mg via ORAL
  Filled 2022-03-25 (×3): qty 1

## 2022-03-25 NOTE — Progress Notes (Addendum)
? ?Progress Note ? ?Patient Name: Timothy Mata ?Date of Encounter: 03/25/2022 ? ?[facility] Cardiologist: None New (lives in [place])  ? ?Subjective  ? ?Denies chest pain. SOB has improved. Denies palpitations.  ? ?Inpatient Medications  ?  ?Scheduled Meds: ? allopurinol  300 mg Oral QPC supper  ? aspirin EC  81 mg Oral Daily  ? atenolol  50 mg Oral QPC supper  ? atorvastatin  40 mg Oral Daily  ? mometasone-formoterol  2 puff Inhalation BID  ? tamsulosin  0.4 mg Oral QPC supper  ? ?Continuous Infusions: ? diltiazem (CARDIZEM) infusion 10 mg/hr (03/25/22 0727)  ? heparin 1,300 Units/hr (03/25/22 0726)  ? ?PRN Meds: ?acetaminophen, nitroGLYCERIN, ondansetron (ZOFRAN) IV  ? ?Vital Signs  ?  ?Vitals:  ? 03/24/22 2330 03/25/22 0100 03/25/22 0300 03/25/22 0700  ?BP: 105/86 125/65 118/64 110/60  ?Pulse: 78 64 65 60  ?Resp: (!) 25 20 20 20   ?Temp: 98.1 ?F (36.7 ?C)  98.4 ?F (36.9 ?C) 98.4 ?F (36.9 ?C)  ?TempSrc: Oral  Oral Oral  ?SpO2: 96% 96% 97% 95%  ?Weight:      ?Height:      ? ? ?Intake/Output Summary (Last 24 hours) at 03/25/2022 0839 ?Last data filed at 03/25/2022 0300 ?Gross per 24 hour  ?Intake --  ?Output 1975 ml  ?Net -1975 ml  ? ? ?  03/24/2022  ?  5:17 PM 03/24/2022  ?  4:03 PM 07/18/2015  ?  1:56 PM  ?Last 3 Weights  ?Weight (lbs) 242 lb 240 lb 236 lb  ?Weight (kg) 109.77 kg 108.863 kg 107.049 kg  ?   ? ?Telemetry  ?  ?Atrial flutter with HR in the 60s - Personally Reviewed ? ?ECG  ?  ?Atrial Flutter, HR 62 - Personally Reviewed ? ?Physical Exam  ? ?GEN: No acute distress, wearing nasal cannula. Becomes SOB with movement in the bed    ?Neck: No JVD ?Cardiac: Irregular rate and rhythm, no murmurs, rubs, or gallops.  ?Respiratory: Clear to auscultation bilaterally.  ?GI: Soft, nontender, non-distended  ?MS: No edema; No deformity. ?Neuro:  Nonfocal  ?Psych: Normal affect  ? ?Labs  ?  ?High Sensitivity Troponin:   ?Recent Labs  ?Lab 03/24/22 ?1305 03/24/22 ?1447  ?TROPONINIHS 689* 507*  ?   ?Chemistry ?Recent Labs  ?Lab  03/24/22 ?1709 03/25/22 ?0117  ?NA 141 141  ?K 3.6 3.8  ?CL 105 106  ?CO2 24 25  ?GLUCOSE 129* 114*  ?BUN 25* 28*  ?CREATININE 1.86* 1.94*  ?CALCIUM 8.5* 8.6*  ?GFRNONAA 35* 33*  ?ANIONGAP 12 10  ?  ?Lipids  ?Recent Labs  ?Lab 03/25/22 ?0117  ?CHOL 143  ?TRIG 115  ?HDL 44  ?[place] 76  ?CHOLHDL 3.3  ?  ?Hematology ?Recent Labs  ?Lab 03/24/22 ?1709 03/25/22 ?0117  ?WBC 20.3* 18.2*  ?RBC 5.08 4.79  ?HGB 15.2 14.9  ?HCT 47.3 44.7  ?MCV 93.1 93.3  ?MCH 29.9 31.1  ?MCHC 32.1 33.3  ?RDW 15.0 15.0  ?PLT 284 257  ? ?Thyroid No results for input(s): TSH, FREET4 in the last 168 hours.  ?BNPNo results for input(s): BNP, PROBNP in the last 168 hours.  ?DDimer No results for input(s): DDIMER in the last 168 hours.  ? ?Radiology  ?  ?DG Chest Port 1 View ? ?Result Date: 03/24/2022 ?CLINICAL DATA:  Chest pain. EXAM: PORTABLE CHEST 1 VIEW COMPARISON:  None. FINDINGS: Cardiac silhouette is normal in size. No mediastinal or hilar masses. Hazy opacity in the perihilar and  lower lungs. Mild bilateral interstitial thickening. No convincing pleural effusion.  No pneumothorax. Skeletal structures are grossly intact. IMPRESSION: 1. Mild bilateral interstitial thickening with hazy central airspace opacities suspected to be due to pulmonary edema. Consider infection if there are consistent clinical findings. Electronically Signed   By: Lajean Manes M.D.   On: 03/24/2022 15:40   ? ?Cardiac Studies  ? ?Pending  ? ?Patient Profile  ?   ?88 y.o. male with a history of HTN, COPD, HLD< and BPH who was seen on 3/26 for the evaluation of NSTEMI and atrial fibrillation  ? ?Assessment & Plan  ?  ?Atrial Flutter with RVR  ?- Patient complained of chest pain with SOB, denied palpitations  ?- Patient was given IV Digoxin 0.125 mg once yesterday (D/C'ed) ?- Per telemetry, patient remains in atrial flutter but his HR is well controlled in the 60s  ?-Has been on IV diltiazem for rate control. Transition to PO diltiazem 60 mg Q6hrs (stop IV Dilt one hour  after administering first PO dose)  ?- Continue atenolol 50 mg daily  ?- On IV heparin for anticoagulation in case of invasive procedures. Will be transferred to [place] prior to discharge  ?- Echocardiogram pending  ?- Ordered TSH  ? ?NSTEMI  ?- History of chest pressure with SOB  ?- hsTn downtrending yesterday 689>>507. Will repeat this AM  ?- Echo pending ?- On IV heparin, daily ASA   ?- Patient was on simvastatin 40 mg PTA. Transitioned to lipitor 40 mg daily  ?- Likely that atrial flutter with RVR triggered heart failure and demand ischemia. However, patient did complain of some chest pressure and has GI symptoms that could possibly be a cardiac equivalent ?- If echocardiogram shows a reduced EF or WMA, consider cardiac catheterization. However, patient is a poor candidate given creatinine 1.94. Will discuss with MD. Patient was made NPO at midnight in case  ? ?HTN  ?- Diltiazem 60 mg Q6hrs  ?- Continue atenolol 50 mg daily  ?- Holding home lisinopril/HCTZ to allow titration of rate controlling agents  ?- BP improved this AM  ? ?HLD  ?- LDL 76, HDL 44, triglycerides 115 this admission  ?- Was on simvastatin 40 mg PTA  ?- Transitioned to lipitor 40 mg daily  ? ?For questions or updates, please contact [place] ?Please consult [email or web address] for contact info under  ? ?  ?   ?Signed, ?Margie Billet, PA-C  ?03/25/2022, 8:39 AM   ? ? ?Pateint seen and examined   I agree with findings as noted above  ?Pt comfortable   Laying in bed   ?Breathing is OK   No chest pressure  ? ?Neck  JVP is normla  ?Lungs are relatively clear ?Cardiac exam   irreg irreg   No S3  ?Ext are without edema ? ?Tele with atrial flutter   Rates controlled ?  ?Echo is pending \\Cr  1.94      ? ?Pt doing much better on rate control and anticoagulation   Wait fu echo ?IF LVEF normal I would treat medically, given renal dysfunction  ? ? ? ?Dorris Carnes MD  ?

## 2022-03-25 NOTE — Plan of Care (Signed)
?  Problem: Education: ?Goal: Ability to demonstrate management of disease process will improve ?Outcome: Progressing ?  ?Problem: Activity: ?Goal: Capacity to carry out activities will improve ?Outcome: Progressing ?  ?Problem: Cardiac: ?Goal: Ability to achieve and maintain adequate cardiopulmonary perfusion will improve ?Outcome: Progressing ?  ?

## 2022-03-25 NOTE — Progress Notes (Signed)
? ?  Echocardiogram ?2D Echocardiogram has been performed. ? ?Timothy Mata ?03/25/2022, 10:38 AM ?

## 2022-03-25 NOTE — TOC Benefit Eligibility Note (Signed)
Patient Advocate Encounter ? ?Insurance verification completed.   ? ?The patient is currently admitted and upon discharge could be taking Eliquis 5 mg. ? ?The current 30 day co-pay is, $0.00.  ? ?The patient is insured through [employer] Medicare Part D  ? ? ? ?Algie Cales, CPhT ?Pharmacy Patient Advocate Specialist ?[facility] Patient Advocate Team ?Direct Number: [phone]  Fax: [phone] ? ? ? ? ? ?  ?

## 2022-03-25 NOTE — Progress Notes (Signed)
? ? ? [facility] ?                                                                            ? ? ?Sherif Shareef, is a 88 y.o. male, DOB - 1933-11-23, [id number] ?Admit date - 03/24/2022    ?Outpatient Primary MD for the patient is Rosine Door ? ?LOS - 1  days ? ? ? ?Brief summary  ? ?Nichalas Shupe is a 88 y.o. male with medical history significant of HTN, COPD, Gout, HLD, BPH came with new onset of chest pain and shortness of breath.Marland Kitchen ?At the ED, patient started to have diffuse abdominal pain, tachycardia and monitor showed A-fib.  Blood work showed elevated troponin 163> 134.  Heart rate improved with 2 L of IV bolus.  WBC 17.3, normal hemoglobin, lactic acid 3.3 procalcitonin negative.  UA showed hematuria.  CT chest/abdomen/pelvis no significant acute finding.  Cardiology Dr. Minus Liberty at [facility] was contacted and recommended patient started on non-STEMI protocol with heparin drip. ? ?Assessment & Plan  ? ? ?Assessment and Plan: ? ?Atrial flutter:  ?Rate control with Cardizem 60 mg every 6 hours,  ?On IV heparin with anti coagulation and aspirin 81 mg daily.  ?Cardiology on board.  ?Echocardiogram ordered.  ?TSH wnl.  ? ? ?Elevated troponin due to NSTEMI:  ?Troponins improving.  ?Echocardiogram ordered and pending.  ?Cardiology on board.  ? ? ? ?Chronic diastolic heart failure:  ?He appears compensated.  ?Continue  with strict intake and output.  ? ? ? ?Copd: ?Not on oxygen at home.  ?On 3 lit of [place] Today.  ?No wheezing heard.  ? ? ? ?Bph:  ?Stable.  ? ? ? ?Hypertension:  ?- better controlled.  ? ? ?Hyperlipidemia:  ?Resume lipitor 40 mg daily.  ? ? ? ? ? ? ?Estimated body mass index is 34.72 kg/m? as calculated from the following: ?  Height as of this encounter: 5\' 10"  (1.778 m). ?  Weight as of this encounter: 109.8 kg. ? ?Code Status: full code.  ?DVT Prophylaxis:  IV heparin.  ? ? ?Level of Care: Level of care: Progressive ?Family Communication: family at bedside.  ? ?Disposition  Plan:     IV heparin.  ? ?Procedures:  ?None.  ? ?Consultants:   ?cardiology ? ?Antimicrobials:  ? ?Anti-infectives (From admission, onward)  ? ? None  ? ?  ? ? ? ?Medications ? ?Scheduled Meds: ? allopurinol  300 mg Oral QPC supper  ? aspirin EC  81 mg Oral Daily  ? atenolol  50 mg Oral QPC supper  ? atorvastatin  40 mg Oral Daily  ? diltiazem  60 mg Oral Q6H  ? mometasone-formoterol  2 puff Inhalation BID  ? tamsulosin  0.4 mg Oral QPC supper  ? ?Continuous Infusions: ? heparin 1,300 Units/hr (03/25/22 0726)  ? ?PRN Meds:.acetaminophen, nitroGLYCERIN, ondansetron (ZOFRAN) IV ? ? ? ?Subjective:  ? ?Kayode Kornfeld was seen and examined today.  Pt on 3 lit of [place] oxygen, no chest pain or sob.  ? ?Objective:  ? ?Vitals:  ? 03/25/22 0100 03/25/22 0300 03/25/22 0700 03/25/22 1057  ?BP: 125/65 118/64 110/60 118/62  ?Pulse: 64 65 60 65  ?Resp:  20 20 20 20   ?Temp:  98.4 ?F (36.9 ?C) 98.4 ?F (36.9 ?C) 98.7 ?F (37.1 ?C)  ?TempSrc:  Oral Oral Oral  ?SpO2: 96% 97% 95% 97%  ?Weight:      ?Height:      ? ? ?Intake/Output Summary (Last 24 hours) at 03/25/2022 1351 ?Last data filed at 03/25/2022 0300 ?Gross per 24 hour  ?Intake --  ?Output 1975 ml  ?Net -1975 ml  ? ?Filed Weights  ? 03/24/22 1603 03/24/22 1717  ?Weight: 108.9 kg 109.8 kg  ? ? ? ?Exam ?General exam: Appears calm and comfortable  ?Respiratory system: Clear to auscultation. Respiratory effort normal. ?Cardiovascular system: S1 & S2 heard, irregularly irregular, No JVD,. No pedal edema. ?Gastrointestinal system: Abdomen is nondistended, soft and nontender. Normal bowel sounds heard. ?Central nervous system: Alert and oriented. No focal neurological deficits. ?Extremities: Symmetric 5 x 5 power. ?Skin: No rashes, lesions or ulcers ?Psychiatry: Mood & affect appropriate.  ? ? ?Data Reviewed:  I have personally reviewed following labs and imaging studies ? ? ?CBC ?Lab Results  ?Component Value Date  ? WBC 18.2 (H) 03/25/2022  ? RBC 4.79 03/25/2022  ? HGB 14.9 03/25/2022  ? HCT  44.7 03/25/2022  ? MCV 93.3 03/25/2022  ? MCH 31.1 03/25/2022  ? PLT 257 03/25/2022  ? MCHC 33.3 03/25/2022  ? RDW 15.0 03/25/2022  ? ? ? ?Last metabolic panel ?Lab Results  ?Component Value Date  ? NA 141 03/25/2022  ? K 3.8 03/25/2022  ? CL 106 03/25/2022  ? CO2 25 03/25/2022  ? BUN 28 (H) 03/25/2022  ? CREATININE 1.94 (H) 03/25/2022  ? GLUCOSE 114 (H) 03/25/2022  ? GFRNONAA 33 (L) 03/25/2022  ? GFRAA 47 (L) 07/18/2015  ? CALCIUM 8.6 (L) 03/25/2022  ? ANIONGAP 10 03/25/2022  ? ? ?CBG (last 3)  ?No results for input(s): GLUCAP in the last 72 hours.  ? ? ?Coagulation Profile: ?No results for input(s): INR, PROTIME in the last 168 hours. ? ? ?Radiology Studies: ?DG Chest Port 1 View ? ?Result Date: 03/24/2022 ?CLINICAL DATA:  Chest pain. EXAM: PORTABLE CHEST 1 VIEW COMPARISON:  None. FINDINGS: Cardiac silhouette is normal in size. No mediastinal or hilar masses. Hazy opacity in the perihilar and lower lungs. Mild bilateral interstitial thickening. No convincing pleural effusion.  No pneumothorax. Skeletal structures are grossly intact. IMPRESSION: 1. Mild bilateral interstitial thickening with hazy central airspace opacities suspected to be due to pulmonary edema. Consider infection if there are consistent clinical findings. Electronically Signed   By: Lajean Manes M.D.   On: 03/24/2022 15:40  ? ?ECHOCARDIOGRAM COMPLETE ? ?Result Date: 03/25/2022 ?   ECHOCARDIOGRAM REPORT   Patient Name:   Quartez Mcgirr Date of Exam: 03/25/2022 Medical Rec #:  [id number]   Height:       70.0 in Accession #:    [id number]  Weight:       242.0 lb Date of Birth:  1933-12-18  BSA:          2.263 m? Patient Age:    33 years    BP:           110/60 mmHg Patient Gender: M           HR:           66 bpm. Exam Location:  Inpatient Procedure: 2D Echo, Cardiac Doppler and Color Doppler Indications:    I21.4 NSTEMI  History:        Patient has no prior history of  Echocardiogram examinations.                 COPD; Risk Factors:Hypertension and  Dyslipidemia.  Sonographer:    Beryle Beams Referring Phys: [id number] [place]  1. Left ventricular ejection fraction, by estimation, is 60 to 65%. The left ventricle has normal function. The left ventricle has no regional wall motion abnormalities. Left ventricular diastolic function could not be evaluated.  2. Right ventricular systolic function is normal. The right ventricular size is normal.  3. The mitral valve is normal in structure. Trivial mitral valve regurgitation. No evidence of mitral stenosis.  4. The aortic valve is tricuspid. Aortic valve regurgitation is not visualized. Aortic valve sclerosis is present, with no evidence of aortic valve stenosis.  5. The inferior vena cava is dilated in size with >50% respiratory variability, suggesting right atrial pressure of 8 mmHg. Comparison(s): No prior Echocardiogram. FINDINGS  Left Ventricle: Left ventricular ejection fraction, by estimation, is 60 to 65%. The left ventricle has normal function. The left ventricle has no regional wall motion abnormalities. The left ventricular internal cavity size was normal in size. There is  no left ventricular hypertrophy. Left ventricular diastolic function could not be evaluated due to atrial fibrillation. Left ventricular diastolic function could not be evaluated. Right Ventricle: The right ventricular size is normal. Right ventricular systolic function is normal. Left Atrium: Left atrial size was normal in size. Right Atrium: Right atrial size was normal in size. Pericardium: Trivial pericardial effusion is present. Mitral Valve: The mitral valve is normal in structure. Mild mitral annular calcification. Trivial mitral valve regurgitation. No evidence of mitral valve stenosis. Tricuspid Valve: The tricuspid valve is normal in structure. Tricuspid valve regurgitation is trivial. No evidence of tricuspid stenosis. Aortic Valve: The aortic valve is tricuspid. Aortic valve regurgitation is not visualized.  Aortic valve sclerosis is present, with no evidence of aortic valve stenosis. Aortic valve mean gradient measures 3.0 mmHg. Aortic valve peak gradient measures 5.1  mmHg. Aortic valve area, by VTI measures 0.97 c

## 2022-03-25 NOTE — Plan of Care (Signed)
?  Problem: Education: ?Goal: Ability to demonstrate management of disease process will improve ?03/25/2022 2350 by Muscab Brenneman, Gilford Raid, RN ?Outcome: Progressing ?03/25/2022 2350 by [place] Mckeough, Gilford Raid, RN ?Outcome: Progressing ?Goal: Ability to verbalize understanding of medication therapies will improve ?03/25/2022 2350 by Sallyann Kinnaird, Gilford Raid, RN ?Outcome: Progressing ?03/25/2022 2350 by Sylas Twombly, Gilford Raid, RN ?Outcome: Progressing ?Goal: Individualized Educational Video(s) ?03/25/2022 2350 by Tola Meas, Gilford Raid, RN ?Outcome: Progressing ?03/25/2022 2350 by Mccade Sullenberger, Gilford Raid, RN ?Outcome: Progressing ?  ?Problem: Activity: ?Goal: Capacity to carry out activities will improve ?03/25/2022 2350 by Trinia Georgi, Gilford Raid, RN ?Outcome: Progressing ?03/25/2022 2350 by Eagle Pitta, Gilford Raid, RN ?Outcome: Progressing ?  ?Problem: Cardiac: ?Goal: Ability to achieve and maintain adequate cardiopulmonary perfusion will improve ?03/25/2022 2350 by Awanda Wilcock, Gilford Raid, RN ?Outcome: Progressing ?03/25/2022 2350 by Waleska Buttery, Gilford Raid, RN ?Outcome: Progressing ?  ?

## 2022-03-25 NOTE — Progress Notes (Addendum)
ANTICOAGULATION CONSULT NOTE  ? ?Pharmacy Consult for heparin ?Indication: chest pain/ACS ? ?No Known Allergies ? ?Patient Measurements: ?Height: 5\' 10"  (177.8 cm) ?Weight: 109.8 kg (242 lb) ?IBW/kg (Calculated) : 73 ?Heparin Dosing Weight: 97 kg ? ?Vital Signs: ?Temp: 98.4 ?F (36.9 ?C) (03/27 0700) ?Temp Source: Oral (03/27 0700) ?BP: 110/60 (03/27 0700) ?Pulse Rate: 60 (03/27 0700) ? ?Labs: ?Recent Labs  ?  03/24/22 ?1305 03/24/22 ?1447 03/24/22 ?1709 03/25/22 ?0117  ?HGB  --   --  15.2 14.9  ?HCT  --   --  47.3 44.7  ?PLT  --   --  284 257  ?HEPARINUNFRC  --  0.12*  --  0.35  ?CREATININE  --   --  1.86* 1.94*  ?TROPONINIHS 689* 507*  --   --   ? ? ? ?Estimated Creatinine Clearance: 33.9 mL/min (A) (by C-G formula based on SCr of 1.94 mg/dL (H)). ? ? ?Medical History: ?Past Medical History:  ?Diagnosis Date  ? BPH (benign prostatic hyperplasia)   ? COPD (chronic obstructive pulmonary disease) ([place])   ? Gout   ? "left big toe"  ? HOH (hard of hearing)   ? Hypercholesterolemia   ? Hypertension   ? Preretinal fibrosis, left eye   ? Spinal headache 1974  ? Stone, kidney   ? "never had OR" (07/18/2015)  ? ?  ?Assessment: ?88 yo M on heparin from outside hospital for NSTEMI and Afib RVR. No known anticoagulation prior to admission per chart and RX fill review. Pharmacy consulted for heparin.   ? ?Heparin level 0.35 is at goal on 1300 units/hr. No issues with infusion or bleeding noted. CBC stable overnight.  ? ?Goal of Therapy:  ?Heparin level 0.3-0.7 units/ml ?Monitor platelets by anticoagulation protocol: Yes ?  ?Plan:  ?Continue heparin at 1300 units/hr ?Daily CBC and heparin level  ?Follow up plan for any invasive procedures prior to starting oral anticoagulation ? ?Erin Hearing PharmD., BCPS ?Clinical Pharmacist ?03/26/2022 8:45 AM ? ? ?Please check AMION for all [place] phone numbers ?After 10:00 PM, call [place] [phone] ? ?

## 2022-03-26 ENCOUNTER — Inpatient Hospital Stay (HOSPITAL_COMMUNITY): Payer: Medicare HMO

## 2022-03-26 DIAGNOSIS — E872 Acidosis, unspecified: Secondary | ICD-10-CM

## 2022-03-26 DIAGNOSIS — D72829 Elevated white blood cell count, unspecified: Secondary | ICD-10-CM

## 2022-03-26 DIAGNOSIS — I4892 Unspecified atrial flutter: Secondary | ICD-10-CM

## 2022-03-26 DIAGNOSIS — I5032 Chronic diastolic (congestive) heart failure: Secondary | ICD-10-CM

## 2022-03-26 DIAGNOSIS — I214 Non-ST elevation (NSTEMI) myocardial infarction: Secondary | ICD-10-CM | POA: Diagnosis not present

## 2022-03-26 DIAGNOSIS — I483 Typical atrial flutter: Secondary | ICD-10-CM

## 2022-03-26 DIAGNOSIS — I10 Essential (primary) hypertension: Secondary | ICD-10-CM

## 2022-03-26 LAB — CBC
HCT: 42.3 % (ref 39.0–52.0)
Hemoglobin: 13.6 g/dL (ref 13.0–17.0)
MCH: 30 pg (ref 26.0–34.0)
MCHC: 32.2 g/dL (ref 30.0–36.0)
MCV: 93.4 fL (ref 80.0–100.0)
Platelets: 230 10*3/uL (ref 150–400)
RBC: 4.53 MIL/uL (ref 4.22–5.81)
RDW: 15 % (ref 11.5–15.5)
WBC: 14 10*3/uL — ABNORMAL HIGH (ref 4.0–10.5)
nRBC: 0 % (ref 0.0–0.2)

## 2022-03-26 LAB — BASIC METABOLIC PANEL
Anion gap: 9 (ref 5–15)
BUN: 30 mg/dL — ABNORMAL HIGH (ref 8–23)
CO2: 26 mmol/L (ref 22–32)
Calcium: 8.2 mg/dL — ABNORMAL LOW (ref 8.9–10.3)
Chloride: 103 mmol/L (ref 98–111)
Creatinine, Ser: 1.92 mg/dL — ABNORMAL HIGH (ref 0.61–1.24)
GFR, Estimated: 34 mL/min — ABNORMAL LOW (ref >60)
Glucose, Bld: 94 mg/dL (ref 70–99)
Potassium: 3.9 mmol/L (ref 3.5–5.1)
Sodium: 138 mmol/L (ref 135–145)

## 2022-03-26 LAB — HEPARIN LEVEL (UNFRACTIONATED): Heparin Unfractionated: 0.26 IU/mL — ABNORMAL LOW (ref 0.30–0.70)

## 2022-03-26 MED ORDER — APIXABAN 2.5 MG PO TABS
2.5000 mg | ORAL_TABLET | Freq: Two times a day (BID) | ORAL | Status: DC
  Administered 2022-03-26 – 2022-03-27 (×3): 2.5 mg via ORAL
  Filled 2022-03-26 (×3): qty 1

## 2022-03-26 MED ORDER — DILTIAZEM HCL ER COATED BEADS 240 MG PO CP24
240.0000 mg | ORAL_CAPSULE | Freq: Every day | ORAL | Status: DC
Start: 2022-03-26 — End: 2022-03-27
  Administered 2022-03-26 – 2022-03-27 (×2): 240 mg via ORAL
  Filled 2022-03-26 (×2): qty 1

## 2022-03-26 NOTE — Progress Notes (Signed)
ANTICOAGULATION CONSULT NOTE  ? ?Pharmacy Consult for heparin ?Indication: chest pain/ACS ? ?No Known Allergies ? ?Patient Measurements: ?Height: 5\' 10"  (177.8 cm) ?Weight: 109.8 kg (242 lb) ?IBW/kg (Calculated) : 73 ?Heparin Dosing Weight: 97 kg ? ?Vital Signs: ?Temp: 97.8 ?F (36.6 ?C) (03/28 09-29-1992) ?Temp Source: Oral (03/28 09-29-1992) ?BP: 117/66 (03/28 0711) ?Pulse Rate: 63 (03/28 0711) ? ?Labs: ?Recent Labs  ?  03/24/22 ?1447 03/24/22 ?1709 03/24/22 ?1709 03/25/22 ?0117 03/25/22 ?1052 03/25/22 ?1253 03/26/22 ?0108  ?HGB  --  15.2   < > 14.9  --   --  13.6  ?HCT  --  47.3  --  44.7  --   --  42.3  ?PLT  --  284  --  257  --   --  230  ?HEPARINUNFRC 0.12*  --   --  0.35  --   --  0.26*  ?CREATININE  --  1.86*  --  1.94*  --   --   --   ?TROPONINIHS 507*  --   --   --  531* 478*  --   ? < > = values in this interval not displayed.  ? ? ? ?Estimated Creatinine Clearance: 33.9 mL/min (A) (by C-G formula based on SCr of 1.94 mg/dL (H)). ? ? ?Medical History: ?Past Medical History:  ?Diagnosis Date  ? BPH (benign prostatic hyperplasia)   ? COPD (chronic obstructive pulmonary disease) ([facility])   ? Gout   ? "left big toe"  ? HOH (hard of hearing)   ? Hypercholesterolemia   ? Hypertension   ? Preretinal fibrosis, left eye   ? Spinal headache 1974  ? Stone, kidney   ? "never had OR" (07/18/2015)  ? ?  ?Assessment: ?88 yo M on heparin from outside hospital for NSTEMI and Afib RVR. No known anticoagulation prior to admission per chart and RX fill review. Pharmacy consulted for heparin.   ? ?Heparin level low this morning at 0.26 on 1300 units/hr. No issues with infusion or bleeding noted. Hemoglobin down slightly overnight from 14.9>13.6, will follow.  ? ?Goal of Therapy:  ?Heparin level 0.3-0.7 units/ml ?Monitor platelets by anticoagulation protocol: Yes ?  ?Plan:  ?Increase heparin to 1400 units/hr ?Daily CBC and heparin level  ?Follow up plan for any invasive procedures prior to starting oral anticoagulation ? ?88  PharmD., BCPS ?Clinical Pharmacist ?03/26/2022 8:45 AM ? ?Please check AMION for all [facility] Pharmacy phone numbers ?After 10:00 PM, call Main Pharmacy [phone] ? ?

## 2022-03-26 NOTE — Progress Notes (Addendum)
? ?Progress Note ? ?Patient Name: Timothy Mata ?Date of Encounter: 03/26/2022 ? ?[facility] Cardiologist: None New (lives in [place]) ? ?Subjective  ? ?Denies chest pain, palpitations, sob. Is looking forward to going home soon  ? ?Inpatient Medications  ?  ?Scheduled Meds: ? allopurinol  300 mg Oral QPC supper  ? aspirin EC  81 mg Oral Daily  ? atenolol  50 mg Oral QPC supper  ? atorvastatin  40 mg Oral Daily  ? diltiazem  60 mg Oral Q6H  ? mometasone-formoterol  2 puff Inhalation BID  ? tamsulosin  0.4 mg Oral QPC supper  ? ?Continuous Infusions: ? heparin 1,300 Units/hr (03/26/22 0245)  ? ?PRN Meds: ?acetaminophen, nitroGLYCERIN, ondansetron (ZOFRAN) IV  ? ?Vital Signs  ?  ?Vitals:  ? 03/25/22 2300 03/26/22 0320 03/26/22 [id number] 03/26/22 [id number]  ?BP: (!) 120/57 129/80 117/66   ?Pulse: 66 93 63   ?Resp: 20 19 18    ?Temp: 97.6 ?F (36.4 ?C) 97.7 ?F (36.5 ?C) 97.8 ?F (36.6 ?C)   ?TempSrc: Oral Oral Oral   ?SpO2: 96% 96% 96% 96%  ?Weight:      ?Height:      ? ? ?Intake/Output Summary (Last 24 hours) at 03/26/2022 0847 ?Last data filed at 03/26/2022 [id number] ?Gross per 24 hour  ?Intake 570.05 ml  ?Output 1550 ml  ?Net -979.95 ml  ? ? ?  03/24/2022  ?  5:17 PM 03/24/2022  ?  4:03 PM 07/18/2015  ?  1:56 PM  ?Last 3 Weights  ?Weight (lbs) 242 lb 240 lb 236 lb  ?Weight (kg) 109.77 kg 108.863 kg 107.049 kg  ?   ? ?Telemetry  ?  ?Atrial flutter, HR in the 60s-80s - Personally Reviewed ? ?ECG  ?  ?Atrial flutter, HR 62, nonspecific intraventricular  conduction delay- Personally Reviewed ? ?Physical Exam  ? ?GEN: No acute distress.   ?Neck: No JVD ?Cardiac: Irregular rate and rhythm, no murmurs, rubs, or gallops. Radial pulses 2+ bilaterally  ?Respiratory: Occasional Expiratory wheezing  ?GI: Soft, nontender, non-distended  ?MS: No edema; No deformity. ?Neuro:  Nonfocal  ?Psych: Normal affect  ? ?Labs  ?  ?High Sensitivity Troponin:   ?Recent Labs  ?Lab 03/24/22 ?1305 03/24/22 ?1447 03/25/22 ?1052 03/25/22 ?1253  ?TROPONINIHS 689* 507* 531*  478*  ?   ?Chemistry ?Recent Labs  ?Lab 03/24/22 ?1709 03/25/22 ?0117  ?NA 141 141  ?K 3.6 3.8  ?CL 105 106  ?CO2 24 25  ?GLUCOSE 129* 114*  ?BUN 25* 28*  ?CREATININE 1.86* 1.94*  ?CALCIUM 8.5* 8.6*  ?GFRNONAA 35* 33*  ?ANIONGAP 12 10  ?  ?Lipids  ?Recent Labs  ?Lab 03/25/22 ?0117  ?CHOL 143  ?TRIG 115  ?HDL 44  ?[place] 76  ?CHOLHDL 3.3  ?  ?Hematology ?Recent Labs  ?Lab 03/24/22 ?1709 03/25/22 ?0117 03/26/22 ?0108  ?WBC 20.3* 18.2* 14.0*  ?RBC 5.08 4.79 4.53  ?HGB 15.2 14.9 13.6  ?HCT 47.3 44.7 42.3  ?MCV 93.1 93.3 93.4  ?MCH 29.9 31.1 30.0  ?MCHC 32.1 33.3 32.2  ?RDW 15.0 15.0 15.0  ?PLT 284 257 230  ? ?Thyroid  ?Recent Labs  ?Lab 03/25/22 ?1052  ?TSH 3.307  ?  ?BNPNo results for input(s): BNP, PROBNP in the last 168 hours.  ?DDimer No results for input(s): DDIMER in the last 168 hours.  ? ?Radiology  ?  ?DG Chest Port 1 View ? ?Result Date: 03/24/2022 ?CLINICAL DATA:  Chest pain. EXAM: PORTABLE CHEST 1 VIEW COMPARISON:  None. FINDINGS: Cardiac  silhouette is normal in size. No mediastinal or hilar masses. Hazy opacity in the perihilar and lower lungs. Mild bilateral interstitial thickening. No convincing pleural effusion.  No pneumothorax. Skeletal structures are grossly intact. IMPRESSION: 1. Mild bilateral interstitial thickening with hazy central airspace opacities suspected to be due to pulmonary edema. Consider infection if there are consistent clinical findings. Electronically Signed   By: Lajean Manes M.D.   On: 03/24/2022 15:40  ? ?ECHOCARDIOGRAM COMPLETE ? ?Result Date: 03/25/2022 ?   ECHOCARDIOGRAM REPORT   Patient Name:   Timothy Mata Date of Exam: 03/25/2022 Medical Rec #:  [id number]   Height:       70.0 in Accession #:    [id number]  Weight:       242.0 lb Date of Birth:  1933/10/06  BSA:          2.263 m? Patient Age:    88 years    BP:           110/60 mmHg Patient Gender: M           HR:           66 bpm. Exam Location:  Inpatient Procedure: 2D Echo, Cardiac Doppler and Color Doppler Indications:     I21.4 NSTEMI  History:        Patient has no prior history of Echocardiogram examinations.                 COPD; Risk Factors:Hypertension and Dyslipidemia.  Sonographer:    Beryle Beams Referring Phys: [id number] [place]  1. Left ventricular ejection fraction, by estimation, is 60 to 65%. The left ventricle has normal function. The left ventricle has no regional wall motion abnormalities. Left ventricular diastolic function could not be evaluated.  2. Right ventricular systolic function is normal. The right ventricular size is normal.  3. The mitral valve is normal in structure. Trivial mitral valve regurgitation. No evidence of mitral stenosis.  4. The aortic valve is tricuspid. Aortic valve regurgitation is not visualized. Aortic valve sclerosis is present, with no evidence of aortic valve stenosis.  5. The inferior vena cava is dilated in size with >50% respiratory variability, suggesting right atrial pressure of 8 mmHg. Comparison(s): No prior Echocardiogram. FINDINGS  Left Ventricle: Left ventricular ejection fraction, by estimation, is 60 to 65%. The left ventricle has normal function. The left ventricle has no regional wall motion abnormalities. The left ventricular internal cavity size was normal in size. There is  no left ventricular hypertrophy. Left ventricular diastolic function could not be evaluated due to atrial fibrillation. Left ventricular diastolic function could not be evaluated. Right Ventricle: The right ventricular size is normal. Right ventricular systolic function is normal. Left Atrium: Left atrial size was normal in size. Right Atrium: Right atrial size was normal in size. Pericardium: Trivial pericardial effusion is present. Mitral Valve: The mitral valve is normal in structure. Mild mitral annular calcification. Trivial mitral valve regurgitation. No evidence of mitral valve stenosis. Tricuspid Valve: The tricuspid valve is normal in structure. Tricuspid valve  regurgitation is trivial. No evidence of tricuspid stenosis. Aortic Valve: The aortic valve is tricuspid. Aortic valve regurgitation is not visualized. Aortic valve sclerosis is present, with no evidence of aortic valve stenosis. Aortic valve mean gradient measures 3.0 mmHg. Aortic valve peak gradient measures 5.1  mmHg. Aortic valve area, by VTI measures 0.97 cm?. Pulmonic Valve: The pulmonic valve was not well visualized. Pulmonic valve regurgitation is not visualized. No evidence of pulmonic  stenosis. Aorta: The aortic root is normal in size and structure. Venous: The inferior vena cava is dilated in size with greater than 50% respiratory variability, suggesting right atrial pressure of 8 mmHg. IAS/Shunts: No atrial level shunt detected by color flow Doppler.  LEFT VENTRICLE PLAX 2D LVIDd:         5.30 cm LVIDs:         3.10 cm LV PW:         1.00 cm LV IVS:        1.00 cm LVOT diam:     1.60 cm LV SV:         23 LV SV Index:   10 LVOT Area:     2.01 cm?  LV Volumes (MOD) LV vol d, MOD A2C: 55.7 ml LV vol d, MOD A4C: 65.5 ml LV vol s, MOD A2C: 18.5 ml LV vol s, MOD A4C: 29.6 ml LV SV MOD A2C:     37.2 ml LV SV MOD A4C:     65.5 ml LV SV MOD BP:      36.9 ml RIGHT VENTRICLE             IVC RV S prime:     11.10 cm/s  IVC diam: 2.30 cm TAPSE (M-mode): 1.5 cm LEFT ATRIUM             Index        RIGHT ATRIUM           Index LA diam:        4.10 cm 1.81 cm/m?   RA Area:     16.10 cm? LA Vol (A2C):   48.3 ml 21.34 ml/m?  RA Volume:   42.80 ml  18.91 ml/m? LA Vol (A4C):   37.4 ml 16.52 ml/m? LA Biplane Vol: 43.3 ml 19.13 ml/m?  AORTIC VALVE                    PULMONIC VALVE AV Area (Vmax):    1.00 cm?     PV Vmax:        0.45 m/s AV Area (Vmean):   0.96 cm?     PV Vmean:       30.800 cm/s AV Area (VTI):     0.97 cm?     PV VTI:         0.106 m AV Vmax:           113.00 cm/s  PV Peak grad:   0.8 mmHg AV Vmean:          76.000 cm/s  PV Mean grad:   0.0 mmHg AV VTI:            0.240 m      RVOT Peak grad: 4 mmHg AV Peak  Grad:      5.1 mmHg AV Mean Grad:      3.0 mmHg LVOT Vmax:         56.30 cm/s LVOT Vmean:        36.100 cm/s LVOT VTI:          0.116 m LVOT/AV VTI ratio: 0.48  AORTA Ao Root diam: 2.70 cm Ao Asc diam:  2.80 cm MI

## 2022-03-26 NOTE — Progress Notes (Signed)
Iv site where heparin, is infusing is reddened and swollen.  PIV removed and ice pack applied for 20 min. Swelling  subsided minimally. Continue to monitor. ?

## 2022-03-26 NOTE — Discharge Instructions (Addendum)

## 2022-03-26 NOTE — TOC Transition Note (Signed)
Transition of Care (TOC) - CM/SW Discharge Note ? ? ?Patient Details  ?Name: Herald Vallin ?MRN: [id number] ?Date of Birth: 1933/02/19 ? ?Transition of Care (TOC) CM/SW Contact:  ?Beckie Busing, RN ?Phone Number:[phone] ? ?03/26/2022, 3:16 PM ? ? ?Clinical Narrative:    ? ?Transition of Care (TOC) Screening Note ? ? ?Patient Details  ?Name: Sheriff Rodenberg ?Date of Birth: [birth year] ? ? ?Transition of Care (TOC) CM/SW Contact:    ?Beckie Busing, RN ?Phone Number: ?03/26/2022, 3:16 PM ? ? ? ?Transition of Care Department [facility]) has reviewed patient and no TOC needs have been identified at this time. We will continue to monitor patient advancement through interdisciplinary progression rounds. If new patient transition needs arise, please place a TOC consult. ? ? ? ? ?  ?  ? ? ?Patient Goals and CMS Choice ?  ?  ?  ? ?Discharge Placement ?  ?           ?  ?  ?  ?  ? ?Discharge Plan and Services ?  ?  ?           ?  ?  ?  ?  ?  ?  ?  ?  ?  ?  ? ?Social Determinants of Health (SDOH) Interventions ?  ? ? ?Readmission Risk Interventions ?   ? View : No data to display.  ?  ?  ?  ? ? ? ? ? ?

## 2022-03-26 NOTE — Care Management Important Message (Signed)
Important Message ? ?Patient Details  ?Name: Timothy Mata ?MRN: [id number] ?Date of Birth: 12-06-1933 ? ? ?Medicare Important Message Given:  Yes ? ? ? ? ?Timothy Mata ?03/26/2022, 1:44 PM ?

## 2022-03-26 NOTE — Progress Notes (Addendum)
? ? ? [facility] ?                                                                            ? ? ?Timothy Mata, is a 88 y.o. male, DOB - 1933/11/12, NKN:[id number] ?Admit date - 03/24/2022    ?Outpatient Primary MD for the patient is Sheela Stack ? ?LOS - 2  days ? ? ? ?Brief summary  ? ?Timothy Mata is a 89 y.o. male with medical history significant of HTN, COPD, Gout, HLD, BPH came with new onset of chest pain and shortness of breath.Marland Kitchen ?At the ED, patient started to have diffuse abdominal pain, tachycardia and monitor showed A-fib.  Blood work showed elevated troponin 163> 134.  Heart rate improved with 2 L of IV bolus.  WBC 17.3, normal hemoglobin, lactic acid 3.3 procalcitonin negative.  UA showed hematuria.  CT chest/abdomen/pelvis no significant acute finding.  Cardiology was contacted and recommended patient started on non-STEMI protocol with heparin drip. ?His rate is controlled with Cardizem and on IV heparin.  ? ?Assessment & Plan  ? ? ?Assessment and Plan: ? ?Atrial flutter with RVR.  ?Rate control with Cardizem 60 mg every 6 hours, consolidate to long acting cardizem today along with atenolol 50 mg daily.   ?On IV heparin with anti coagulation , transition to DOAC today and  continue with aspirin 81 mg daily.  ?Cardiology on board, and following.  ?Echocardiogram ordered, showed LVEF of 60 to 65% , with normal function, no regional wall motion abnormalities, ventricular diastolic function couldn't be evaluated.  ?TSH wnl.  ? ? ?Elevated troponin due to NSTEMI:  ?Troponins improving.  ?Echocardiogram ordered and reviewed.  ?Cardiology on board.  ?Transition IV heparin to eliquis today as per cardiology.  ? ? ? ?Chronic diastolic heart failure:  ?He appears compensated.  ?Continue  with strict intake and output.  ?Diuresed about 2.9 lit since admission.  ?Received only one dose of lasix on admission.  ? ? ? ?Copd: ?Not on oxygen at home.  ?ON [place] [facility] on admission.  ?No wheezing  heard .  ?Continue with Dulera.  ? ? ? ?Bph:  ?Stable.  ?Resume flomax.  ? ? ? ?Hypertension:  ?Well controlled.  ? ? ?Hyperlipidemia:  ?Resume lipitor 40 mg daily.  ? ? ?Lactic acidosis;  ?Resolved.  ? ? ?Leukocytosis:  ?Unclear etiology. He is afebrile, non toxic.  ?No source of infection found. UA shows rare bacteria and negative leukocytes and neg nitrites.  ?Wbc count improving on its own . Pro calcitonin is 0.48 ?Recheck CXR today.  ?Continue to monitor.  ? ? ?Estimated body mass index is 34.72 kg/m? as calculated from the following: ?  Height as of this encounter: 5\' 10"  (1.778 m). ?  Weight as of this encounter: 109.8 kg. ? ?Code Status: full code.  ?DVT Prophylaxis:  apixaban (ELIQUIS) tablet 2.5 mg Start: 03/26/22 1015IV heparin.  ?apixaban (ELIQUIS) tablet 2.5 mg  ? ?Level of Care: Level of care: Progressive ?Family Communication: family at bedside.  ? ?Disposition Plan:     IV heparin.  ? ?Procedures:  ?None.  ? ?Consultants:   ?cardiology ? ?Antimicrobials:  ? ?Anti-infectives (From admission, onward)  ? ?  None  ? ?  ? ? ? ?Medications ? ?Scheduled Meds: ? allopurinol  300 mg Oral QPC supper  ? apixaban  2.5 mg Oral BID  ? aspirin EC  81 mg Oral Daily  ? atenolol  50 mg Oral QPC supper  ? atorvastatin  40 mg Oral Daily  ? diltiazem  240 mg Oral Daily  ? mometasone-formoterol  2 puff Inhalation BID  ? tamsulosin  0.4 mg Oral QPC supper  ? ?Continuous Infusions: ? ? ?PRN Meds:.acetaminophen, nitroGLYCERIN, ondansetron (ZOFRAN) IV ? ? ? ?Subjective:  ? ?[name] was seen and examined today.  Pt on 3 lit of [place] oxygen, no chest pain or sob.  ? ?Objective:  ? ?Vitals:  ? 03/25/22 2300 03/26/22 0320 03/26/22 4970 03/26/22 2637  ?BP: (!) 120/57 129/80 117/66   ?Pulse: 66 93 63   ?Resp: 20 19 18    ?Temp: 97.6 ?F (36.4 ?C) 97.7 ?F (36.5 ?C) 97.8 ?F (36.6 ?C)   ?TempSrc: Oral Oral Oral   ?SpO2: 96% 96% 96% 96%  ?Weight:      ?Height:      ? ? ?Intake/Output Summary (Last 24 hours) at 03/26/2022 1104 ?Last data  filed at 03/26/2022 0900 ?Gross per 24 hour  ?Intake 598.05 ml  ?Output 1550 ml  ?Net -951.95 ml  ? ?Filed Weights  ? 03/24/22 1603 03/24/22 1717  ?Weight: 108.9 kg 109.8 kg  ? ? ? ?Exam ?General exam: Appears calm and comfortable  ?Respiratory system: Clear to auscultation. Respiratory effort normal. ?Cardiovascular system: S1 & S2 heard,Irregularly irregular. No JVD, \ ?Gastrointestinal system: Abdomen is nondistended, soft and nontender.  Normal bowel sounds heard. ?Central nervous system: Alert and oriented. No focal neurological deficits. ?Extremities: Symmetric 5 x 5 power. ?Skin: No rashes, lesions or ulcers ?Psychiatry:  Mood & affect appropriate.  ? ? ? ?Data Reviewed:  I have personally reviewed following labs and imaging studies ? ? ?CBC ?Lab Results  ?Component Value Date  ? WBC 14.0 (H) 03/26/2022  ? RBC 4.53 03/26/2022  ? HGB 13.6 03/26/2022  ? HCT 42.3 03/26/2022  ? MCV 93.4 03/26/2022  ? MCH 30.0 03/26/2022  ? PLT 230 03/26/2022  ? MCHC 32.2 03/26/2022  ? RDW 15.0 03/26/2022  ? ? ? ?Last metabolic panel ?Lab Results  ?Component Value Date  ? NA 138 03/26/2022  ? K 3.9 03/26/2022  ? CL 103 03/26/2022  ? CO2 26 03/26/2022  ? BUN 30 (H) 03/26/2022  ? CREATININE 1.92 (H) 03/26/2022  ? GLUCOSE 94 03/26/2022  ? GFRNONAA 34 (L) 03/26/2022  ? GFRAA 47 (L) 07/18/2015  ? CALCIUM 8.2 (L) 03/26/2022  ? ANIONGAP 9 03/26/2022  ? ? ?CBG (last 3)  ?No results for input(s): GLUCAP in the last 72 hours.  ? ? ?Coagulation Profile: ?No results for input(s): INR, PROTIME in the last 168 hours. ? ? ?Radiology Studies: ?DG Chest Port 1 View ? ?Result Date: 03/24/2022 ?CLINICAL DATA:  Chest pain. EXAM: PORTABLE CHEST 1 VIEW COMPARISON:  None. FINDINGS: Cardiac silhouette is normal in size. No mediastinal or hilar masses. Hazy opacity in the perihilar and lower lungs. Mild bilateral interstitial thickening. No convincing pleural effusion.  No pneumothorax. Skeletal structures are grossly intact. IMPRESSION: 1. Mild bilateral  interstitial thickening with hazy central airspace opacities suspected to be due to pulmonary edema. Consider infection if there are consistent clinical findings. Electronically Signed   By: Amie Portland M.D.   On: 03/24/2022 15:40  ? ?ECHOCARDIOGRAM COMPLETE ? ?Result Date: 03/25/2022 ?  ECHOCARDIOGRAM REPORT   Patient Name:   Timothy Mata Date of Exam: 03/25/2022 Medical Rec #:  [id number]   Height:       70.0 in Accession #:    [id number]  Weight:       242.0 lb Date of Birth:  1933-11-06  BSA:          2.263 m? Patient Age:    88 years    BP:           110/60 mmHg Patient Gender: M           HR:           66 bpm. Exam Location:  Inpatient Procedure: 2D Echo, Cardiac Doppler and Color Doppler Indications:    I21.4 NSTEMI  History:        Patient has no prior history of Echocardiogram examinations.                 COPD; Risk Factors:Hypertension and Dyslipidemia.  Sonographer:    Festus Barren Referring Phys: [id number] PING T ZHANG IMPRESSIONS  1. Left ventricular ejection fraction, by estimation, is 60 to 65%. The left ventricle has normal function. The left ventricle has no regional wall motion abnormalities. Left ventricular diastolic function could not be evaluated.  2. Right ventricular systolic function is normal. The right ventricular size is normal.  3. The mitral valve is normal in structure. Trivial mitral valve regurgitation. No evidence of mitral stenosis.  4. The aortic valve is tricuspid. Aortic valve regurgitation is not visualized. Aortic valve sclerosis is present, with no evidence of aortic valve stenosis.  5. The inferior vena cava is dilated in size with >50% respiratory variability, suggesting right atrial pressure of 8 mmHg. Comparison(s): No prior Echocardiogram. FINDINGS  Left Ventricle: Left ventricular ejection fraction, by estimation, is 60 to 65%. The left ventricle has normal function. The left ventricle has no regional wall motion abnormalities. The left ventricular internal cavity size  was normal in size. There is  no left ventricular hypertrophy. Left ventricular diastolic function could not be evaluated due to atrial fibrillation. Left ventricular diastolic function could not be evalua

## 2022-03-27 DIAGNOSIS — I214 Non-ST elevation (NSTEMI) myocardial infarction: Secondary | ICD-10-CM | POA: Diagnosis not present

## 2022-03-27 LAB — BASIC METABOLIC PANEL
Anion gap: 10 (ref 5–15)
BUN: 31 mg/dL — ABNORMAL HIGH (ref 8–23)
CO2: 25 mmol/L (ref 22–32)
Calcium: 8.6 mg/dL — ABNORMAL LOW (ref 8.9–10.3)
Chloride: 102 mmol/L (ref 98–111)
Creatinine, Ser: 1.73 mg/dL — ABNORMAL HIGH (ref 0.61–1.24)
GFR, Estimated: 38 mL/min — ABNORMAL LOW (ref >60)
Glucose, Bld: 98 mg/dL (ref 70–99)
Potassium: 4.2 mmol/L (ref 3.5–5.1)
Sodium: 137 mmol/L (ref 135–145)

## 2022-03-27 LAB — CBC
HCT: 43 % (ref 39.0–52.0)
Hemoglobin: 13.9 g/dL (ref 13.0–17.0)
MCH: 30.5 pg (ref 26.0–34.0)
MCHC: 32.3 g/dL (ref 30.0–36.0)
MCV: 94.5 fL (ref 80.0–100.0)
Platelets: 229 10*3/uL (ref 150–400)
RBC: 4.55 MIL/uL (ref 4.22–5.81)
RDW: 14.9 % (ref 11.5–15.5)
WBC: 13.3 10*3/uL — ABNORMAL HIGH (ref 4.0–10.5)
nRBC: 0 % (ref 0.0–0.2)

## 2022-03-27 MED ORDER — DILTIAZEM HCL ER COATED BEADS 240 MG PO CP24: 240.0000 mg | ORAL_CAPSULE | Freq: Every day | ORAL | 2 refills | Status: DC

## 2022-03-27 MED ORDER — APIXABAN 2.5 MG PO TABS: 2.5000 mg | ORAL_TABLET | Freq: Two times a day (BID) | ORAL | 2 refills | Status: DC

## 2022-03-27 MED ORDER — TAMSULOSIN HCL 0.4 MG PO CAPS: 0.4000 mg | ORAL_CAPSULE | Freq: Every day | ORAL | 3 refills | Status: DC

## 2022-03-27 MED ORDER — NITROGLYCERIN 0.4 MG SL SUBL: 0.4000 mg | SUBLINGUAL_TABLET | SUBLINGUAL | 12 refills | Status: DC | PRN

## 2022-03-27 MED ORDER — ATORVASTATIN CALCIUM 40 MG PO TABS: 40.0000 mg | ORAL_TABLET | Freq: Every day | ORAL | 2 refills | Status: DC

## 2022-03-27 MED ORDER — ASPIRIN 81 MG PO TABS: 81.0000 mg | ORAL_TABLET | Freq: Every day | ORAL | 2 refills | Status: AC

## 2022-03-27 NOTE — Progress Notes (Signed)
SATURATION QUALIFICATIONS: (This note is used to comply with regulatory documentation for home oxygen) ? ?Patient Saturations on Room Air at Rest = 94 ?% ? ?Patient Saturations on Room Air while Ambulating = 94% ? ?Patient Saturations on 0 Liters of oxygen while Ambulating = 94 ?% ? ?Please briefly explain why patient needs home oxygen:pt does not need  oxygen . ?

## 2022-03-27 NOTE — Evaluation (Signed)
Physical Therapy Evaluation & Discharge ?Patient Details ?Name: Timothy Mata ?MRN: [id number] ?DOB: June 16, 1933 ?Today's Date: 03/27/2022 ? ?History of Present Illness ? Pt is an 88 y.o. male who presented 03/24/22 with chest pain and SOB. Pt with elevated troponins due to NSETMI and with atrial flutter with RVR. PMH: HTN, COPD, Gout, HLD, BPH ?  ?Clinical Impression ? Pt presents with condition above. PTA, he was living with his wife in a multi-level house with 4 STE and functioning IND without DME. Pt reported being up with therapy today was his first time out of bed since admission, with him displaying a trunk sway and instability initially coming to stand and with initial gait, needing up to minA to maintain balance. As distance progressed, pt's stability and gait fluidity improved, even with dynamic challenges, in which he only required min guard for safety with no further LOB bouts. I suspect as he continues to mobilize he will quickly return to his baseline as he made significant progress today in such a short period of time with mobilizing. Encouraged pt to mobilize with nursing staff while here. All education completed and questions answered, PT will sign off. ?   ? ?Recommendations for follow up therapy are one component of a multi-disciplinary discharge planning process, led by the attending physician.  Recommendations may be updated based on patient status, additional functional criteria and insurance authorization. ? ?Follow Up Recommendations No PT follow up ? ?  ?Assistance Recommended at Discharge PRN  ?Patient can return home with the following ?   ? ?  ?Equipment Recommendations None recommended by PT  ?Recommendations for Other Services ?    ?  ?Functional Status Assessment Patient has had a recent decline in their functional status and/or demonstrates limited ability to make significant improvements in function in a reasonable and predictable amount of time  ? ?  ?Precautions / Restrictions  Precautions ?Precautions: Fall (low risk) ?Restrictions ?Weight Bearing Restrictions: No  ? ?  ? ?Mobility ? Bed Mobility ?Overal bed mobility: Modified Independent ?  ?  ?  ?  ?  ?  ?General bed mobility comments: Pt using bed rails with HOB slightly elevated to transition supine > sit EOB, no assistance needed. ?  ? ?Transfers ?Overall transfer level: Needs assistance ?Equipment used: None ?Transfers: Sit to/from Stand ?Sit to Stand: Min assist ?  ?  ?  ?  ?  ?General transfer comment: Pt able to come to stand from EOB after 2 attempts, displaying a mild trunk sway and wide BOS, minA for stability only. ?  ? ?Ambulation/Gait ?Ambulation/Gait assistance: Min assist, Min guard, +2 safety/equipment ?Gait Distance (Feet): 360 Feet ?Assistive device: None ?Gait Pattern/deviations: Step-through pattern, Decreased stride length, Wide base of support ?Gait velocity: reduced ?Gait velocity interpretation: >2.62 ft/sec, indicative of community ambulatory ?  ?General Gait Details: Pt with x2 minor LOB bouts initially, needing minA and reactional stepping to regain balance. As distance progressed, his fluidity and stability improved in which pt only required min guard assist for safety with no further LOB bouts. Pt then able to change head positions and directions and stop suddenly without LOB or significant changes in gait. ? ?Stairs ?  ?  ?  ?  ?  ? ?Wheelchair Mobility ?  ? ?Modified Rankin (Stroke Patients Only) ?  ? ?  ? ?Balance Overall balance assessment: Mild deficits observed, not formally tested (initially, but improved) ?  ?  ?  ?  ?  ?  ?  ?  ?  ?  ?  ?  ?  ?  ?  ?  ?  ?  ?   ? ? ? ?  Pertinent Vitals/Pain Pain Assessment ?Pain Assessment: No/denies pain  ? ? ?Home Living Family/patient expects to be discharged to:: Private residence ?Living Arrangements: Spouse/significant other ?Available Help at Discharge: Family;Available 24 hours/day ?Type of Home: House ?Home Access: Stairs to enter ?Entrance Stairs-Rails: Can  reach both ?Entrance Stairs-Number of Steps: 4 ?Alternate Level Stairs-Number of Steps: 13 ?Home Layout: Multi-level;Laundry or work area in basement;Able to live on main level with bedroom/bathroom ?Home Equipment: Grab bars - tub/shower;Grab bars - toilet ?   ?  ?Prior Function Prior Level of Function : Independent/Modified Independent;Driving ?  ?  ?  ?  ?  ?  ?Mobility Comments: No falls. ?ADLs Comments: Likes to manage his acres of land. ?  ? ? ?Hand Dominance  ?   ? ?  ?Extremity/Trunk Assessment  ? Upper Extremity Assessment ?Upper Extremity Assessment: Defer to OT evaluation ?  ? ?Lower Extremity Assessment ?Lower Extremity Assessment: Overall WFL for tasks assessed ?  ? ?Cervical / Trunk Assessment ?Cervical / Trunk Assessment: Normal  ?Communication  ? Communication: No difficulties  ?Cognition Arousal/Alertness: Awake/alert ?Behavior During Therapy: [facility] for tasks assessed/performed ?Overall Cognitive Status: Within Functional Limits for tasks assessed ?  ?  ?  ?  ?  ?  ?  ?  ?  ?  ?  ?  ?  ?  ?  ?  ?  ?  ?  ? ?  ?General Comments General comments (skin integrity, edema, etc.): VSS on RA; reported this was his first time up OOB since admission ? ?  ?Exercises    ? ?Assessment/Plan  ?  ?PT Assessment Patient does not need any further PT services  ?PT Problem List   ? ?   ?  ?PT Treatment Interventions     ? ?PT Goals (Current goals can be found in the Care Plan section)  ?Acute Rehab PT Goals ?Patient Stated Goal: to go home ?PT Goal Formulation: All assessment and education complete, DC therapy ?Time For Goal Achievement: 03/28/22 ?Potential to Achieve Goals: Good ? ?  ?Frequency   ?  ? ? ?Co-evaluation PT/OT/SLP Co-Evaluation/Treatment: Yes ?Reason for Co-Treatment: For patient/therapist safety;To address functional/ADL transfers ?PT goals addressed during session: Mobility/safety with mobility;Balance ?  ?  ? ? ?  ?AM-PAC PT "6 Clicks" Mobility  ?Outcome Measure Help needed turning from your back to your  side while in a flat bed without using bedrails?: None ?Help needed moving from lying on your back to sitting on the side of a flat bed without using bedrails?: None ?Help needed moving to and from a bed to a chair (including a wheelchair)?: A Little ?Help needed standing up from a chair using your arms (e.g., wheelchair or bedside chair)?: A Little ?Help needed to walk in hospital room?: A Little ?Help needed climbing 3-5 steps with a railing? : A Little ?6 Click Score: 20 ? ?  ?End of Session Equipment Utilized During Treatment: Gait belt ?Activity Tolerance: Patient tolerated treatment well ?Patient left: in chair;with call bell/phone within reach ?Nurse Communication: Mobility status ?PT Visit Diagnosis: Unsteadiness on feet (R26.81) ?  ? ?Time: [id number] ?PT Time Calculation (min) (ACUTE ONLY): 24 min ? ? ?Charges:   PT Evaluation ?$PT Eval Low Complexity: 1 Low ?  ?  ?   ? ? ?Moishe Spice, PT, DPT ?Acute Rehabilitation Services  ?Pager: [phone] ?Office: [phone] ? ? ?Maretta Bees Pettis ?03/27/2022, 9:05 AM ? ?

## 2022-03-27 NOTE — Progress Notes (Addendum)
? ?Progress Note ? ?Patient Name: Joshwa Loebs ?Date of Encounter: 03/27/2022 ? ?[facility] Cardiologist: Dorris Carnes, MD  ? ?Subjective  ? ?Patient denies chest pain, palpitations, dizziness, lightheadedness, SOB. Was taken off of nasal cannula this AM. Has not ambulated.  ? ?Inpatient Medications  ?  ?Scheduled Meds: ? allopurinol  300 mg Oral QPC supper  ? apixaban  2.5 mg Oral BID  ? aspirin EC  81 mg Oral Daily  ? atenolol  50 mg Oral QPC supper  ? atorvastatin  40 mg Oral Daily  ? diltiazem  240 mg Oral Daily  ? mometasone-formoterol  2 puff Inhalation BID  ? tamsulosin  0.4 mg Oral QPC supper  ? ?Continuous Infusions: ? ?PRN Meds: ?acetaminophen, nitroGLYCERIN, ondansetron (ZOFRAN) IV  ? ?Vital Signs  ?  ?Vitals:  ? 03/27/22 0300 03/27/22 0327 03/27/22 0735 03/27/22 0758  ?BP:  (!) 125/54 (!) 107/91 (!) 107/91  ?Pulse: 62 (!) 57 61 75  ?Resp: 18 20 19 17   ?Temp:  98.2 ?F (36.8 ?C)    ?TempSrc:  Oral    ?SpO2: 97% 97% 93% 91%  ?Weight:      ?Height:      ? ? ?Intake/Output Summary (Last 24 hours) at 03/27/2022 0800 ?Last data filed at 03/27/2022 0757 ?Gross per 24 hour  ?Intake 1188 ml  ?Output 925 ml  ?Net 263 ml  ? ? ?  03/24/2022  ?  5:17 PM 03/24/2022  ?  4:03 PM 07/18/2015  ?  1:56 PM  ?Last 3 Weights  ?Weight (lbs) 242 lb 240 lb 236 lb  ?Weight (kg) 109.77 kg 108.863 kg 107.049 kg  ?   ? ?Telemetry  ?  ?Atrial flutter, HR in the 60s - Personally Reviewed ? ?ECG  ?  ?No new tracings - Personally Reviewed ? ?Physical Exam  ? ?GEN: No acute distress. Laying comfortably in the bed.   ?Neck: No JVD ?Cardiac: Irregular rate and rhythm, no murmurs, rubs, or gallops.  ?Respiratory: Clear to auscultation bilaterally. Occasional expiratory wheezing ?GI: Soft, nontender, non-distended  ?MS: No edema; No deformity. ?Neuro:  Nonfocal  ?Psych: Normal affect  ? ?Labs  ?  ?High Sensitivity Troponin:   ?Recent Labs  ?Lab 03/24/22 ?1305 03/24/22 ?1447 03/25/22 ?1052 03/25/22 ?1253  ?TROPONINIHS 689* 507* 531* 478*  ?    ?Chemistry ?Recent Labs  ?Lab 03/25/22 ?0117 03/26/22 ?0108 03/27/22 ?[id number]  ?NA 141 138 137  ?K 3.8 3.9 4.2  ?CL 106 103 102  ?CO2 25 26 25   ?GLUCOSE 114* 94 98  ?BUN 28* 30* 31*  ?CREATININE 1.94* 1.92* 1.73*  ?CALCIUM 8.6* 8.2* 8.6*  ?GFRNONAA 33* 34* 38*  ?ANIONGAP 10 9 10   ?  ?Lipids  ?Recent Labs  ?Lab 03/25/22 ?0117  ?CHOL 143  ?TRIG 115  ?HDL 44  ?Pantops 76  ?CHOLHDL 3.3  ?  ?Hematology ?Recent Labs  ?Lab 03/25/22 ?0117 03/26/22 ?0108 03/27/22 ?[id number]  ?WBC 18.2* 14.0* 13.3*  ?RBC 4.79 4.53 4.55  ?HGB 14.9 13.6 13.9  ?HCT 44.7 42.3 43.0  ?MCV 93.3 93.4 94.5  ?MCH 31.1 30.0 30.5  ?MCHC 33.3 32.2 32.3  ?RDW 15.0 15.0 14.9  ?PLT 257 230 229  ? ?Thyroid  ?Recent Labs  ?Lab 03/25/22 ?1052  ?TSH 3.307  ?  ?BNPNo results for input(s): BNP, PROBNP in the last 168 hours.  ?DDimer No results for input(s): DDIMER in the last 168 hours.  ? ?Radiology  ?  ?DG Chest 2 View ? ?Result Date: 03/26/2022 ?CLINICAL  DATA:  COPD, hypertension, follow-up EXAM: CHEST - 2 VIEW COMPARISON:  03/24/2022 FINDINGS: Enlargement of cardiac silhouette. Mediastinal contours and pulmonary vascularity normal. Atherosclerotic calcification aorta. Small bibasilar pleural effusions. No definite acute infiltrate or pneumothorax. IMPRESSION: Enlargement of cardiac silhouette with small bibasilar pleural effusions. Aortic Atherosclerosis (ICD10-I70.0). Electronically Signed   By: Lavonia Dana M.D.   On: 03/26/2022 14:16  ? ?ECHOCARDIOGRAM COMPLETE ? ?Result Date: 03/25/2022 ?   ECHOCARDIOGRAM REPORT   Patient Name:   Odies Liebig Date of Exam: 03/25/2022 Medical Rec #:  [id number]   Height:       70.0 in Accession #:    [id number]  Weight:       242.0 lb Date of Birth:  Dec 28, 1933  BSA:          2.263 m? Patient Age:    62 years    BP:           110/60 mmHg Patient Gender: M           HR:           66 bpm. Exam Location:  Inpatient Procedure: 2D Echo, Cardiac Doppler and Color Doppler Indications:    I21.4 NSTEMI  History:        Patient has no prior  history of Echocardiogram examinations.                 COPD; Risk Factors:Hypertension and Dyslipidemia.  Sonographer:    Beryle Beams Referring Phys: [id number] [place]  1. Left ventricular ejection fraction, by estimation, is 60 to 65%. The left ventricle has normal function. The left ventricle has no regional wall motion abnormalities. Left ventricular diastolic function could not be evaluated.  2. Right ventricular systolic function is normal. The right ventricular size is normal.  3. The mitral valve is normal in structure. Trivial mitral valve regurgitation. No evidence of mitral stenosis.  4. The aortic valve is tricuspid. Aortic valve regurgitation is not visualized. Aortic valve sclerosis is present, with no evidence of aortic valve stenosis.  5. The inferior vena cava is dilated in size with >50% respiratory variability, suggesting right atrial pressure of 8 mmHg. Comparison(s): No prior Echocardiogram. FINDINGS  Left Ventricle: Left ventricular ejection fraction, by estimation, is 60 to 65%. The left ventricle has normal function. The left ventricle has no regional wall motion abnormalities. The left ventricular internal cavity size was normal in size. There is  no left ventricular hypertrophy. Left ventricular diastolic function could not be evaluated due to atrial fibrillation. Left ventricular diastolic function could not be evaluated. Right Ventricle: The right ventricular size is normal. Right ventricular systolic function is normal. Left Atrium: Left atrial size was normal in size. Right Atrium: Right atrial size was normal in size. Pericardium: Trivial pericardial effusion is present. Mitral Valve: The mitral valve is normal in structure. Mild mitral annular calcification. Trivial mitral valve regurgitation. No evidence of mitral valve stenosis. Tricuspid Valve: The tricuspid valve is normal in structure. Tricuspid valve regurgitation is trivial. No evidence of tricuspid stenosis.  Aortic Valve: The aortic valve is tricuspid. Aortic valve regurgitation is not visualized. Aortic valve sclerosis is present, with no evidence of aortic valve stenosis. Aortic valve mean gradient measures 3.0 mmHg. Aortic valve peak gradient measures 5.1  mmHg. Aortic valve area, by VTI measures 0.97 cm?. Pulmonic Valve: The pulmonic valve was not well visualized. Pulmonic valve regurgitation is not visualized. No evidence of pulmonic stenosis. Aorta: The aortic root is normal in size and  structure. Venous: The inferior vena cava is dilated in size with greater than 50% respiratory variability, suggesting right atrial pressure of 8 mmHg. IAS/Shunts: No atrial level shunt detected by color flow Doppler.  LEFT VENTRICLE PLAX 2D LVIDd:         5.30 cm LVIDs:         3.10 cm LV PW:         1.00 cm LV IVS:        1.00 cm LVOT diam:     1.60 cm LV SV:         23 LV SV Index:   10 LVOT Area:     2.01 cm?  LV Volumes (MOD) LV vol d, MOD A2C: 55.7 ml LV vol d, MOD A4C: 65.5 ml LV vol s, MOD A2C: 18.5 ml LV vol s, MOD A4C: 29.6 ml LV SV MOD A2C:     37.2 ml LV SV MOD A4C:     65.5 ml LV SV MOD BP:      36.9 ml RIGHT VENTRICLE             IVC RV S prime:     11.10 cm/s  IVC diam: 2.30 cm TAPSE (M-mode): 1.5 cm LEFT ATRIUM             Index        RIGHT ATRIUM           Index LA diam:        4.10 cm 1.81 cm/m?   RA Area:     16.10 cm? LA Vol (A2C):   48.3 ml 21.34 ml/m?  RA Volume:   42.80 ml  18.91 ml/m? LA Vol (A4C):   37.4 ml 16.52 ml/m? LA Biplane Vol: 43.3 ml 19.13 ml/m?  AORTIC VALVE                    PULMONIC VALVE AV Area (Vmax):    1.00 cm?     PV Vmax:        0.45 m/s AV Area (Vmean):   0.96 cm?     PV Vmean:       30.800 cm/s AV Area (VTI):     0.97 cm?     PV VTI:         0.106 m AV Vmax:           113.00 cm/s  PV Peak grad:   0.8 mmHg AV Vmean:          76.000 cm/s  PV Mean grad:   0.0 mmHg AV VTI:            0.240 m      RVOT Peak grad: 4 mmHg AV Peak Grad:      5.1 mmHg AV Mean Grad:      3.0 mmHg LVOT Vmax:          56.30 cm/s LVOT Vmean:        36.100 cm/s LVOT VTI:          0.116 m LVOT/AV VTI ratio: 0.48  AORTA Ao Root diam: 2.70 cm Ao Asc diam:  2.80 cm MITRAL VALVE MV Area (PHT): 4.49 cm?     SHUNTS MV Decel Time:

## 2022-03-27 NOTE — Discharge Summary (Signed)
?Physician Discharge Summary  ?Timothy Mata [id number] DOB: 1933-07-17 DOA: 03/24/2022 ? ?PCP: Rosalee Kaufman, PA-C ? ?Admit date: 03/24/2022 ?Discharge date: 03/27/2022 ? ?Admitted From: Home ?Disposition: Home ? ?Recommendations for Outpatient Follow-up:  ?Follow up with PCP in 1-2 weeks ?Follow-up with cardiology, Dr. Harrington Challenger as scheduled on 04/19/2022 at 9:40 AM ?Started on diltiazem 240 mg p.o. daily and Eliquis for A-fib with RVR ?Discontinued simvastatin in favor of atorvastatin ?Discontinued home lisinopril/HCTZ ? ?Home Health: No needs identified by PT/OT ?Equipment/Devices: None ? ?Discharge Condition: Stable ?CODE STATUS: Full code ?Diet recommendation: Heart healthy diet ? ?History of present illness: ? ?Timothy Mata an 88 year old male with past medical history significant for essential hypertension, COPD, gout, hyperlipidemia, BPH who presented to [facility] ED on 3/26 with chest pain and shortness of breath.  Onset 3-4 days prior.  Chest pain described as a intermittent pressure-like sensation, centrally located associated with increasing exertional dyspnea; improved with rest.  ? ?In the ED, patient was noted to be in A-fib/flutter with RVR.  Sodium 141, potassium 3.6, chloride 105, CO2 24, glucose 129, BUN 25, creatinine 1.86.  Lactic acid 2.1.  WBC 20.3, hemoglobin 15.2, platelet count 284.  Urinalysis with negative leukocytes, negative nitrite, rare bacteria.  Troponin 689.  Chest x-ray with mild bilateral interstitial thickening with hazy central airspace opacities suspected to be pulmonary edema.  Cardiology was consulted.  Patient was started on a heparin drip.  Hospitalist service consulted for further evaluation and management of A-fib with RVR, NSTEMI. ? ?Hospital course: ? ? ?Atrial flutter with RVR ?Patient presents ED with chest pain, shortness of breath.  TTE with LVEF 60 to 65%, normal RV systolic function.  Patient was initially started on a Cardizem and heparin drip.  Cardiology  was consulted and followed during hospital course.  Patient's rate was controlled and was transitioned to Cardizem CD2 140 mg p.o. daily, continue home atenolol 50 mg p.o. daily and heparin drip was transitioned to Eliquis 2.5 mg p.o. twice daily.  Cardiology plans outpatient DCCV in 1 month once adequately anticoagulated.  Patient with a follow-up appointment with Dr. Harrington Challenger on 04/19/2022. ? ?NSTEMI ?Patient presenting with chest pain.  High sensitive troponin initially 689 and downtrending to 478 on hospitalization.  TTE with LVEF 60-65%, no regional wall motion abnormalities.  Chest pain now resolved with control of heart rate as above.  Continue atenolol 50 mg p.o. daily, atorvastatin 40 mg p.o. daily, aspirin 81 mg p.o. daily.  Sublingual nitroglycerin as needed for recurrent chest pain.  Outpatient follow-up with cardiology as above. ? ?Essential hypertension ?Home lisinopril/HCTZ discontinued.  Started on Cardizem CD 240 mg p.o. daily, and continues on home atenolol 50 mg p.o. daily.  Outpatient follow-up with PCP/cardiology. ? ?Hyperlipidemia ?LDL 76, HDL 44, triglycerides 115.  Simvastatin discontinued in favor of atorvastatin 40 mg p.o. daily.  Outpatient follow-up with PCP. ? ?Hx Gout ?Continue home allopurinol milligrams p.o. daily ? ?COPD ?Continue Advair 2 puffs twice daily. ? ?BPH ?Continue tamsulosin ? ?Lactic acidosis ?Etiology likely secondary to dehydration.  Resolved with IV fluid hydration. ? ?Obesity ?Body mass index is 34.72 kg/m?Marland Kitchen  Discussed with patient needs for aggressive lifestyle changes/weight loss as this complicates all facets of care.  Outpatient follow-up with PCP.  ? ? ? ?Discharge Diagnoses:  ?Principal Problem: ?  NSTEMI (non-ST elevated myocardial infarction) ([place]) ?Active Problems: ?  Essential hypertension ?  Chronic diastolic CHF (congestive heart failure) ([place]) ?  Lactic acidosis ?  Leukocytosis ?  Atrial flutter  with rapid ventricular response ([place]) ? ? ? ?Discharge  Instructions ? ?Discharge Instructions   ? ? Call MD for:  difficulty breathing, headache or visual disturbances   Complete by: As directed ?  ? Call MD for:  extreme fatigue   Complete by: As directed ?  ? Call MD for:  persistant dizziness or light-headedness   Complete by: As directed ?  ? Call MD for:  persistant nausea and vomiting   Complete by: As directed ?  ? Call MD for:  severe uncontrolled pain   Complete by: As directed ?  ? Call MD for:  temperature >100.4   Complete by: As directed ?  ? Diet - low sodium heart healthy   Complete by: As directed ?  ? Increase activity slowly   Complete by: As directed ?  ? ?  ? ?Allergies as of 03/27/2022   ?No Known Allergies ?  ? ?  ?Medication List  ?  ? ?STOP taking these medications   ? ?bacitracin-polymyxin b ophthalmic ointment ?Commonly known as: POLYSPORIN ?  ?gatifloxacin 0.5 % Soln ?Commonly known as: ZYMAXID ?  ?lisinopril-hydrochlorothiazide 20-25 MG tablet ?Commonly known as: ZESTORETIC ?  ?prednisoLONE acetate 1 % ophthalmic suspension ?Commonly known as: PRED FORTE ?  ?simvastatin 40 MG tablet ?Commonly known as: ZOCOR ?  ? ?  ? ?TAKE these medications   ? ?allopurinol 300 MG tablet ?Commonly known as: ZYLOPRIM ?Take 300 mg by mouth daily after supper. ?  ?apixaban 2.5 MG Tabs tablet ?Commonly known as: ELIQUIS ?Take 1 tablet (2.5 mg total) by mouth 2 (two) times daily. ?  ?aspirin 81 MG tablet ?Take 1 tablet (81 mg total) by mouth daily. ?What changed: when to take this ?  ?atenolol 50 MG tablet ?Commonly known as: TENORMIN ?Take 50 mg by mouth daily after supper. ?  ?atorvastatin 40 MG tablet ?Commonly known as: LIPITOR ?Take 1 tablet (40 mg total) by mouth daily. ?Start taking on: March 28, 2022 ?  ?diltiazem 240 MG 24 hr capsule ?Commonly known as: CARDIZEM CD ?Take 1 capsule (240 mg total) by mouth daily. ?Start taking on: March 28, 2022 ?  ?Fluticasone-Salmeterol 250-50 MCG/DOSE Aepb ?Commonly known as: ADVAIR ?Inhale 1 puff into the lungs 2 (two)  times daily. ?  ?nitroGLYCERIN 0.4 MG SL tablet ?Commonly known as: NITROSTAT ?Place 1 tablet (0.4 mg total) under the tongue every 5 (five) minutes x 3 doses as needed for chest pain. ?  ?tamsulosin 0.4 MG Caps capsule ?Commonly known as: FLOMAX ?Take 1 capsule (0.4 mg total) by mouth daily after supper. ?  ? ?  ? ? Follow-up Information   ? ? Fay Records, MD Follow up on 04/19/2022.   ?Specialty: Cardiology ?Why: Appointment at 9:40 ?Contact information: ?[address] ?[place] [address] ?[phone] ? ? ?  ?  ? ? Rosalee Kaufman, PA-C. Schedule an appointment as soon as possible for a visit in 1 week(s).   ?Specialty: Physician Assistant ?Contact information: ?[address] ?[place] [address] ?[phone] ? ? ?  ?  ? ?  ?  ? ?  ? ?No Known Allergies ? ?Consultations: ?Cardiology ? ? ?Procedures/Studies: ?DG Chest 2 View ? ?Result Date: 03/26/2022 ?CLINICAL DATA:  COPD, hypertension, follow-up EXAM: CHEST - 2 VIEW COMPARISON:  03/24/2022 FINDINGS: Enlargement of cardiac silhouette. Mediastinal contours and pulmonary vascularity normal. Atherosclerotic calcification aorta. Small bibasilar pleural effusions. No definite acute infiltrate or pneumothorax. IMPRESSION: Enlargement of cardiac silhouette with small bibasilar  pleural effusions. Aortic Atherosclerosis (ICD10-I70.0). Electronically Signed   By: Lavonia Dana M.D.   On: 03/26/2022 14:16  ? ?DG Chest Port 1 View ? ?Result Date: 03/24/2022 ?CLINICAL DATA:  Chest pain. EXAM: PORTABLE CHEST 1 VIEW COMPARISON:  None. FINDINGS: Cardiac silhouette is normal in size. No mediastinal or hilar masses. Hazy opacity in the perihilar and lower lungs. Mild bilateral interstitial thickening. No convincing pleural effusion.  No pneumothorax. Skeletal structures are grossly intact. IMPRESSION: 1. Mild bilateral interstitial thickening with hazy central airspace opacities suspected to be due to pulmonary edema. Consider infection if there are consistent clinical  findings. Electronically Signed   By: Lajean Manes M.D.   On: 03/24/2022 15:40  ? ?ECHOCARDIOGRAM COMPLETE ? ?Result Date: 03/25/2022 ?   ECHOCARDIOGRAM REPORT   Patient Name:   Timothy Mata Date of Exam: 03/25/2022

## 2022-03-27 NOTE — Evaluation (Signed)
Occupational Therapy Evaluation ?Patient Details ?Name: Timothy Mata ?MRN: [id number] ?DOB: 09-03-1933 ?Today's Date: 03/27/2022 ? ? ?History of Present Illness Pt is an 88 y.o. male who presented 03/24/22 with chest pain and SOB. Pt with elevated troponins due to NSETMI and with atrial flutter with RVR. PMH: HTN, COPD, Gout, HLD, BPH  ? ?Clinical Impression ?  ?Pt is typically independent in ADL and functional transfers.  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 "I like being outdoors" He does have some grab bars and supports in the bathroom for safety already in place. Today he was able to demonstrate LB dressing, UB dressing, standing grooming, and transfers at supervision level (due to being in the hospital). Initially with mobility he was a little unsteady and required min A to correct balance - but reports this was his first time OOB since Sunday. His balance, and stamina got better as he mobilized with OT/PT in the hallway. Pt VSS on RA throughout session. He reports that his wife does all cooking/cleaning. Pt with no questions or concerns at this time. Feel Pt is close to baseline with ADL and functional transfers and OT will sign off at this time.  ?   ? ?Recommendations for follow up therapy are one component of a multi-disciplinary discharge planning process, led by the attending physician.  Recommendations may be updated based on patient status, additional functional criteria and insurance authorization.  ? ?Follow Up Recommendations ? No OT follow up  ?  ?Assistance Recommended at Discharge PRN  ?Patient can return home with the following A little help with bathing/dressing/bathroom (safety bathing) ? ?  ?Functional Status Assessment ? Patient has had a recent decline in their functional status and demonstrates the ability to make significant improvements in function in a reasonable and predictable amount of time.  ?Equipment Recommendations ? None  recommended by OT  ?  ?Recommendations for Other Services   ? ? ?  ?Precautions / Restrictions Precautions ?Precautions: Fall (low risk) ?Restrictions ?Weight Bearing Restrictions: No  ? ?  ? ?Mobility Bed Mobility ?Overal bed mobility: Modified Independent ?  ?  ?  ?  ?  ?  ?General bed mobility comments: Pt using bed rails with HOB slightly elevated to transition supine > sit EOB, no assistance needed. ?  ? ?Transfers ?Overall transfer level: Needs assistance ?Equipment used: None ?Transfers: Sit to/from Stand ?Sit to Stand: Min assist ?  ?  ?  ?  ?  ?General transfer comment: Pt able to come to stand from EOB after 2 attempts, displaying a mild trunk sway and wide BOS, minA for stability only. ?  ? ?  ?Balance Overall balance assessment: Mild deficits observed, not formally tested (initially, but improved) ?  ?  ?  ?  ?  ?  ?  ?  ?  ?  ?  ?  ?  ?  ?  ?  ?  ?  ?   ? ?ADL either performed or assessed with clinical judgement  ? ?ADL Overall ADL's : At baseline ?  ?  ?  ?  ?  ?  ?  ?  ?  ?  ?  ?  ?  ?  ?  ?  ?  ?  ?  ?General ADL Comments: initially a little off balance for mobility and transfers in the room (see transfers and PT note) however able to demonstrate LB dressing (socks) standing ADL at sink (grooming) and  multiple transfers. No physical assist or cues needed throughout session.  ? ? ? ?Vision Baseline Vision/History: 1 Wears glasses ?Ability to See in Adequate Light: 0 Adequate ?Patient Visual Report: No change from baseline ?Vision Assessment?: No apparent visual deficits  ?   ?Perception   ?  ?Praxis   ?  ? ?Pertinent Vitals/Pain Pain Assessment ?Pain Assessment: No/denies pain  ? ? ? ?Hand Dominance   ?  ?Extremity/Trunk Assessment Upper Extremity Assessment ?Upper Extremity Assessment: Overall WFL for tasks assessed (baseline missing thumb on R hand) ?  ?Lower Extremity Assessment ?Lower Extremity Assessment: Defer to PT evaluation ?  ?Cervical / Trunk Assessment ?Cervical / Trunk Assessment:  Normal ?  ?Communication Communication ?Communication: No difficulties ?  ?Cognition Arousal/Alertness: Awake/alert ?Behavior During Therapy: [facility] for tasks assessed/performed ?Overall Cognitive Status: Within Functional Limits for tasks assessed ?  ?  ?  ?  ?  ?  ?  ?  ?  ?  ?  ?  ?  ?  ?  ?  ?  ?  ?  ?General Comments  VSS on RA; reported this was his first time up OOB since admission ? ?  ?Exercises   ?  ?Shoulder Instructions    ? ? ?Home Living Family/patient expects to be discharged to:: Private residence ?Living Arrangements: Spouse/significant other ?Available Help at Discharge: Family;Available 24 hours/day ?Type of Home: House ?Home Access: Stairs to enter ?Entrance Stairs-Number of Steps: 4 ?Entrance Stairs-Rails: Can reach both ?Home Layout: Multi-level;Laundry or work area in basement;Able to live on main level with bedroom/bathroom ?Alternate Level Stairs-Number of Steps: 13 ?Alternate Level Stairs-Rails: Left (ascending) ?Bathroom Shower/Tub: Tub/shower unit;Curtain ?  ?Bathroom Toilet: Standard ?  ?  ?Home Equipment: Grab bars - tub/shower;Grab bars - toilet ?  ?  ?  ? ?  ?Prior Functioning/Environment Prior Level of Function : Independent/Modified Independent;Driving ?  ?  ?  ?  ?  ?  ?Mobility Comments: No falls. ?ADLs Comments: Likes to manage his acres of land. ?  ? ?  ?  ?OT Problem List: Decreased activity tolerance;Impaired balance (sitting and/or standing) ?  ?   ?OT Treatment/Interventions:    ?  ?OT Goals(Current goals can be found in the care plan section) Acute Rehab OT Goals ?Patient Stated Goal: to get home today ?OT Goal Formulation: With patient ?Time For Goal Achievement: 04/10/22 ?Potential to Achieve Goals: Good  ?OT Frequency:   ?  ? ?Co-evaluation PT/OT/SLP Co-Evaluation/Treatment: Yes ?Reason for Co-Treatment: For patient/therapist safety;To address functional/ADL transfers ?PT goals addressed during session: Mobility/safety with mobility;Balance ?OT goals addressed during  session: ADL's and self-care ?  ? ?  ?AM-PAC OT "6 Clicks" Daily Activity     ?Outcome Measure Help from another person eating meals?: None ?Help from another person taking care of personal grooming?: None ?Help from another person toileting, which includes using toliet, bedpan, or urinal?: None ?Help from another person bathing (including washing, rinsing, drying)?: A Little ?Help from another person to put on and taking off regular upper body clothing?: None ?  ?6 Click Score: 19 ?  ?End of Session Equipment Utilized During Treatment: Gait belt ?Nurse Communication: Mobility status;Other (comment) (no alarm) ? ?Activity Tolerance: Patient tolerated treatment well ?Patient left: in chair;with call bell/phone within reach ? ?OT Visit Diagnosis: Unsteadiness on feet (R26.81);Muscle weakness (generalized) (M62.81)  ?              ?Time: 6568-1275 ?OT Time Calculation (min): 24 min ?Charges:  OT General Charges ?$  OT Visit: 1 Visit ?OT Evaluation ?$OT Eval Low Complexity: 1 Low ? ?Nyoka Cowden OTR/L ?Acute Rehabilitation Services ?Pager: [phone] ?Office: [phone] ? ?Evern Bio Rael Tilly ?03/27/2022, 9:55 AM ?

## 2022-04-19 ENCOUNTER — Encounter: Payer: Self-pay | Admitting: *Deleted

## 2022-04-19 ENCOUNTER — Ambulatory Visit: Payer: Medicare HMO | Admitting: Internal Medicine

## 2022-04-19 ENCOUNTER — Encounter: Payer: Self-pay | Admitting: Internal Medicine

## 2022-04-19 VITALS — BP 130/78 | HR 134 | Ht 70.0 in | Wt 242.0 lb

## 2022-04-19 DIAGNOSIS — Z01818 Encounter for other preprocedural examination: Secondary | ICD-10-CM | POA: Diagnosis not present

## 2022-04-19 MED ORDER — ATENOLOL 50 MG PO TABS: ORAL_TABLET | ORAL | 3 refills | Status: DC

## 2022-04-19 NOTE — H&P (View-Only) (Signed)
? ?Cardiology Office Note ? ? ?Date:  04/19/2022  ? ?ID:  Timothy Mata, DOB Sep 27, 1933, MRN [id number] ? ?PCP:  Rosalee Kaufman, PA-C  ?Cardiologist:   Dorris Carnes, MD  ? ?Patient presents for follow up of atrial flutter  ? ?  ?History of Present Illness: ?Timothy Mata is a 88 y.o. male with a history of HTN, COPD, HL,  ?In march 2023 he was admitted for chest pressure    Intermittent, felt like GERD   Severe at times with SOB.   When seen wa found to be in afib/flutter with rapid response     Troponin 163   Pt started on anticoagulation and reate control     ?Echo done   LVEF normal         ? He was recently admitted with NSTEMI and atrial flutter  ?Pt admitted to [facility] in March with chest pressure and SOB   Found to be in atrial flutter with RVR    Troponin peak and 689.   Pt's symptoms resolved with rate control    Troponin elevation felt to be due to demand ischemia  ?The pt was discharged home on rate control and Eliquis with plans for cardioversion as outpatient ? ?Since d/c the pt has done OK   He denies CP   Breathing is OK ?No Palppitations     ? ? ? ?Current Meds  ?Medication Sig  ? allopurinol (ZYLOPRIM) 300 MG tablet Take 300 mg by mouth daily after supper.   ? apixaban (ELIQUIS) 2.5 MG TABS tablet Take 1 tablet (2.5 mg total) by mouth 2 (two) times daily.  ? aspirin 81 MG tablet Take 1 tablet (81 mg total) by mouth daily.  ? atenolol (TENORMIN) 50 MG tablet Take 50 mg by mouth daily after supper.   ? atorvastatin (LIPITOR) 40 MG tablet Take 1 tablet (40 mg total) by mouth daily.  ? diltiazem (CARDIZEM CD) 240 MG 24 hr capsule Take 1 capsule (240 mg total) by mouth daily.  ? Fluticasone-Salmeterol (ADVAIR) 250-50 MCG/DOSE AEPB Inhale 1 puff into the lungs 2 (two) times daily.  ? nitroGLYCERIN (NITROSTAT) 0.4 MG SL tablet Place 1 tablet (0.4 mg total) under the tongue every 5 (five) minutes x 3 doses as needed for chest pain.  ? tamsulosin (FLOMAX) 0.4 MG CAPS capsule Take 1 capsule (0.4 mg total)  by mouth daily after supper.  ? ? ? ?Allergies:   Patient has no known allergies.  ? ?Past Medical History:  ?Diagnosis Date  ? BPH (benign prostatic hyperplasia)   ? COPD (chronic obstructive pulmonary disease) ([place])   ? Gout   ? "left big toe"  ? HOH (hard of hearing)   ? Hypercholesterolemia   ? Hypertension   ? Preretinal fibrosis, left eye   ? Spinal headache 1974  ? Stone, kidney   ? "never had OR" (07/18/2015)  ? ? ?Past Surgical History:  ?Procedure Laterality Date  ? 25 GAUGE PARS PLANA VITRECTOMY WITH 20 GAUGE MVR PORT Left 07/18/2015  ? Procedure: 25 GAUGE PARS PLANA VITRECTOMY WITH 20 GAUGE MVR PORT;  Surgeon: Hayden Pedro, MD;  Location: [place];  Service: Ophthalmology;  Laterality: Left;  ? AIR/FLUID EXCHANGE Left 07/18/2015  ? Procedure: AIR/FLUID EXCHANGE;  Surgeon: Hayden Pedro, MD;  Location: [place];  Service: Ophthalmology;  Laterality: Left;  ? BACK SURGERY    ? EYE SURGERY    ? FOOT FRACTURE SURGERY Right 1981  ? LASER PHOTO ABLATION Left  07/18/2015  ? Procedure: LASER PHOTO ABLATION;  Surgeon: Hayden Pedro, MD;  Location: [place];  Service: Ophthalmology;  Laterality: Left;  head scope ?  ? [place] SURGERY  1974  ? MEMBRANE PEEL Left 07/18/2015  ? Procedure: MEMBRANE PEEL;  Surgeon: Hayden Pedro, MD;  Location: [place];  Service: Ophthalmology;  Laterality: Left;  ? MULTIPLE TOOTH EXTRACTIONS  1990's  ? PARS PLANA VITRECTOMY Left 07/18/2015  ? ? ? ?Social History:  The patient  reports that he has quit smoking. His smoking use included cigarettes. He has a 32.00 pack-year smoking history. He has never used smokeless tobacco. He reports that he does not drink alcohol and does not use drugs.  ? ?Family History:  The patient's family history includes Cancer in his father.  ? ? ?ROS:  Please see the history of present illness. All other systems are reviewed and  Negative to the above problem except as noted.  ? ? ?PHYSICAL EXAM: ?VS:  BP 130/78   Pulse (!) 134   Ht 5\' 10"  (1.778 m)   Wt 242  lb (109.8 kg)   SpO2 95%   BMI 34.72 kg/m?   ?GEN: Obese 88 yo, in no acute distress  ?HEENT: normal  ?Neck: no JVD, carotid bruits, ?Cardiac: RRR; no murmurs,  No LE  edema  ?Respiratory:  clear to auscultation bilaterally, ?GI: soft, nontender, nondistended, + BS  No hepatomegaly  ?MS: no deformity Moving all extremities   ?Skin: warm and dry, no rash ?Neuro:  Strength and sensation are intact ?Psych: euthymic mood, full affect ? ? ?EKG:  EKG is ordered today.  Atrial flutter   136 bpm    ? ? ?Echo  march 2023 ? ? 1. Left ventricular ejection fraction, by estimation, is 60 to 65%. The  ?left ventricle has normal function. The left ventricle has no regional  ?wall motion abnormalities. Left ventricular diastolic function could not  ?be evaluated.  ? 2. Right ventricular systolic function is normal. The right ventricular  ?size is normal.  ? 3. The mitral valve is normal in structure. Trivial mitral valve  ?regurgitation. No evidence of mitral stenosis.  ? 4. The aortic valve is tricuspid. Aortic valve regurgitation is not  ?visualized. Aortic valve sclerosis is present, with no evidence of aortic  ?valve stenosis.  ? 5. The inferior vena cava is dilated in size with >50% respiratory  ?variability, suggesting right atrial pressure of 8 mmHg.  ?Lipid Panel ?   ?Component Value Date/Time  ? CHOL 143 03/25/2022 0117  ? TRIG 115 03/25/2022 0117  ? HDL 44 03/25/2022 0117  ? CHOLHDL 3.3 03/25/2022 0117  ? VLDL 23 03/25/2022 0117  ? Ritchie 76 03/25/2022 0117  ? ?  ? ?Wt Readings from Last 3 Encounters:  ?04/19/22 242 lb (109.8 kg)  ?03/24/22 242 lb (109.8 kg)  ?07/18/15 236 lb (107 kg)  ?  ? ? ?ASSESSMENT AND PLAN: ? ?1  Atrial flutter   New dx for patient at recent admission   Plan for cardioversion after 4 wks of anticoagulation.    ?On arrival the pt's rate was high   He is asymptomatic     ?Recomm increasing atenolol to 50 mg / 25 mg daily  Keep on diltiazem ?COntinue Eliquis    He has not missed any doses ?Plan for  cardioversion next week   Risks/benefits described  Pt understands and agrees to proceed ? ?He is asymptomatic   Would need long term anticoagulation.  If aflutter recurs and symptomatic or has to come off anticoag, could consider ablation.    Would need to confirm though that he is not having any afib  ? ?2   Hx NSTEMI   Felt due to demand ischemia   Pt asymtomatic ? ?3  Hx HTN  Keep on medical Rx  ? ?4  CKD   Check BMEt today   Last Cr 1.73    ? ?5  Lipids    Last LDL 76.  Pt on lipitor   ? ?F/U in clinic after cardioversion.     ? ? ?Current medicines are reviewed at length with the patient today.  The patient does not have concerns regarding medicines. ? ?Signed, ?Dorris Carnes, MD  ?04/19/2022 9:48 AM    ?[place] ?[address], [place], [place]  [address] ?Phone: [phone]; Fax: [phone]  ? ? ?

## 2022-04-19 NOTE — Progress Notes (Signed)
? ?Cardiology Office Note ? ? ?Date:  04/19/2022  ? ?ID:  Timothy Mata, DOB [birth year], MRN [id number] ? ?PCP:  Rosalee Kaufman, PA-C  ?Cardiologist:   Dorris Carnes, MD  ? ?Patient presents for follow up of atrial flutter  ? ?  ?History of Present Illness: ?Timothy Mata is a 88 y.o. male with a history of HTN, COPD, HL,  ?In march 2023 he was admitted for chest pressure    Intermittent, felt like GERD   Severe at times with SOB.   When seen wa found to be in afib/flutter with rapid response     Troponin 163   Pt started on anticoagulation and reate control     ?Echo done   LVEF normal         ? He was recently admitted with NSTEMI and atrial flutter  ?Pt admitted to [facility] in March with chest pressure and SOB   Found to be in atrial flutter with RVR    Troponin peak and 689.   Pt's symptoms resolved with rate control    Troponin elevation felt to be due to demand ischemia  ?The pt was discharged home on rate control and Eliquis with plans for cardioversion as outpatient ? ?Since d/c the pt has done OK   He denies CP   Breathing is OK ?No Palppitations     ? ? ? ?Current Meds  ?Medication Sig  ? allopurinol (ZYLOPRIM) 300 MG tablet Take 300 mg by mouth daily after supper.   ? apixaban (ELIQUIS) 2.5 MG TABS tablet Take 1 tablet (2.5 mg total) by mouth 2 (two) times daily.  ? aspirin 81 MG tablet Take 1 tablet (81 mg total) by mouth daily.  ? atenolol (TENORMIN) 50 MG tablet Take 50 mg by mouth daily after supper.   ? atorvastatin (LIPITOR) 40 MG tablet Take 1 tablet (40 mg total) by mouth daily.  ? diltiazem (CARDIZEM CD) 240 MG 24 hr capsule Take 1 capsule (240 mg total) by mouth daily.  ? Fluticasone-Salmeterol (ADVAIR) 250-50 MCG/DOSE AEPB Inhale 1 puff into the lungs 2 (two) times daily.  ? nitroGLYCERIN (NITROSTAT) 0.4 MG SL tablet Place 1 tablet (0.4 mg total) under the tongue every 5 (five) minutes x 3 doses as needed for chest pain.  ? tamsulosin (FLOMAX) 0.4 MG CAPS capsule Take 1 capsule (0.4 mg total)  by mouth daily after supper.  ? ? ? ?Allergies:   Patient has no known allergies.  ? ?Past Medical History:  ?Diagnosis Date  ? BPH (benign prostatic hyperplasia)   ? COPD (chronic obstructive pulmonary disease) ([place])   ? Gout   ? "left big toe"  ? HOH (hard of hearing)   ? Hypercholesterolemia   ? Hypertension   ? Preretinal fibrosis, left eye   ? Spinal headache 1974  ? Stone, kidney   ? "never had OR" (07/18/2015)  ? ? ?Past Surgical History:  ?Procedure Laterality Date  ? 25 GAUGE PARS PLANA VITRECTOMY WITH 20 GAUGE MVR PORT Left 07/18/2015  ? Procedure: 25 GAUGE PARS PLANA VITRECTOMY WITH 20 GAUGE MVR PORT;  Surgeon: Hayden Pedro, MD;  Location: [place];  Service: Ophthalmology;  Laterality: Left;  ? AIR/FLUID EXCHANGE Left 07/18/2015  ? Procedure: AIR/FLUID EXCHANGE;  Surgeon: Hayden Pedro, MD;  Location: [place];  Service: Ophthalmology;  Laterality: Left;  ? BACK SURGERY    ? EYE SURGERY    ? FOOT FRACTURE SURGERY Right 1981  ? LASER PHOTO ABLATION Left  07/18/2015  ? Procedure: LASER PHOTO ABLATION;  Surgeon: John D Matthews, MD;  [facility];  Service: Ophthalmology;  Laterality: Left;  head scope ?  ? LUMBAR DISC SURGERY  1974  ? MEMBRANE PEEL Left 07/18/2015  ? Procedure: MEMBRANE PEEL;  Surgeon: John D Matthews, MD;  Location: [facility];  Service: Ophthalmology;  Laterality: Left;  ? MULTIPLE TOOTH EXTRACTIONS  1990's  ? PARS PLANA VITRECTOMY Left 07/18/2015  ? ? ? ?Social History:  The patient  reports that he has quit smoking. His smoking use included cigarettes. He has a 32.00 pack-year smoking history. He has never used smokeless tobacco. He reports that he does not drink alcohol and does not use drugs.  ? ?Family History:  The patient's family history includes Cancer in his father.  ? ? ?ROS:  Please see the history of present illness. All other systems are reviewed and  Negative to the above problem except as noted.  ? ? ?PHYSICAL EXAM: ?VS:  BP 130/78   Pulse (!) 134   Ht 5' 10" (1.778 m)   Wt 242  lb (109.8 kg)   SpO2 95%   BMI 34.72 kg/m?   ?GEN: Obese 88 yo, in no acute distress  ?HEENT: normal  ?Neck: no JVD, carotid bruits, ?Cardiac: RRR; no murmurs,  No LE  edema  ?Respiratory:  clear to auscultation bilaterally, ?GI: soft, nontender, nondistended, + BS  No hepatomegaly  ?MS: no deformity Moving all extremities   ?Skin: warm and dry, no rash ?Neuro:  Strength and sensation are intact ?Psych: euthymic mood, full affect ? ? ?EKG:  EKG is ordered today.  Atrial flutter   136 bpm    ? ? ?Echo  march 2023 ? ? 1. Left ventricular ejection fraction, by estimation, is 60 to 65%. The  ?left ventricle has normal function. The left ventricle has no regional  ?wall motion abnormalities. Left ventricular diastolic function could not  ?be evaluated.  ? 2. Right ventricular systolic function is normal. The right ventricular  ?size is normal.  ? 3. The mitral valve is normal in structure. Trivial mitral valve  ?regurgitation. No evidence of mitral stenosis.  ? 4. The aortic valve is tricuspid. Aortic valve regurgitation is not  ?visualized. Aortic valve sclerosis is present, with no evidence of aortic  ?valve stenosis.  ? 5. The inferior vena cava is dilated in size with >50% respiratory  ?variability, suggesting right atrial pressure of 8 mmHg.  ?Lipid Panel ?   ?Component Value Date/Time  ? CHOL 143 03/25/2022 0117  ? TRIG 115 03/25/2022 0117  ? HDL 44 03/25/2022 0117  ? CHOLHDL 3.3 03/25/2022 0117  ? VLDL 23 03/25/2022 0117  ? LDLCALC 76 03/25/2022 0117  ? ?  ? ?Wt Readings from Last 3 Encounters:  ?04/19/22 242 lb (109.8 kg)  ?03/24/22 242 lb (109.8 kg)  ?07/18/15 236 lb (107 kg)  ?  ? ? ?ASSESSMENT AND PLAN: ? ?1  Atrial flutter   New dx for patient at recent admission   Plan for cardioversion after 4 wks of anticoagulation.    ?On arrival the pt's rate was high   He is asymptomatic     ?Recomm increasing atenolol to 50 mg / 25 mg daily  Keep on diltiazem ?COntinue Eliquis    He has not missed any doses ?Plan for  cardioversion next week   Risks/benefits described  Pt understands and agrees to proceed ? ?He is asymptomatic   Would need long term anticoagulation.      If aflutter recurs and symptomatic or has to come off anticoag, could consider ablation.    Would need to confirm though that he is not having any afib  ? ?2   Hx NSTEMI   Felt due to demand ischemia   Pt asymtomatic ? ?3  Hx HTN  Keep on medical Rx  ? ?4  CKD   Check BMEt today   Last Cr 1.73    ? ?5  Lipids    Last LDL 76.  Pt on lipitor   ? ?F/U in clinic after cardioversion.     ? ? ?Current medicines are reviewed at length with the patient today.  The patient does not have concerns regarding medicines. ? ?Signed, ?Dorris Carnes, MD  ?04/19/2022 9:48 AM    ?[place] ?[place], [place], [place]  [address] ?Phone: [phone]; Fax: [phone]  ? ? ?

## 2022-04-19 NOTE — Patient Instructions (Addendum)
Medication Instructions:  ?Your physician recommends that you continue on your current medications as directed. Please refer to the Current Medication list given to you today. ? ?Take Atenolol  25 mg in the morning 50 mg in the evening  ? ?*If you need a refill on your cardiac medications before your next appointment, please call your pharmacy* ? ? ?Lab Work: ?Your physician recommends that you return for lab work in: Today  ? ?If you have labs (blood work) drawn today and your tests are completely normal, you will receive your results only by: ?MyChart Message (if you have MyChart) OR ?A paper copy in the mail ?If you have any lab test that is abnormal or we need to change your treatment, we will call you to review the results. ? ? ?Testing/Procedures: ?Your physician has recommended that you have a Cardioversion (DCCV). Electrical Cardioversion uses a jolt of electricity to your heart either through paddles or wired patches attached to your chest. This is a controlled, usually prescheduled, procedure. Defibrillation is done under light anesthesia in the hospital, and you usually go home the day of the procedure. This is done to get your heart back into a normal rhythm. You are not awake for the procedure. Please see the instruction sheet given to you today. ? ? ? ?Follow-Up: ?At [facility], you and your health needs are our priority.  As part of our continuing mission to provide you with exceptional heart care, we have created designated Provider Care Teams.  These Care Teams include your primary Cardiologist (physician) and Advanced Practice Providers (APPs -  Physician Assistants and Nurse Practitioners) who all work together to provide you with the care you need, when you need it. ? ?We recommend signing up for the patient portal called "MyChart".  Sign up information is provided on this After Visit Summary.  MyChart is used to connect with patients for Virtual Visits (Telemedicine).  Patients are able to  view lab/test results, encounter notes, upcoming appointments, etc.  Non-urgent messages can be sent to your provider as well.   ?To learn more about what you can do with MyChart, go to [email or web address].   ? ?Your next appointment:   ?4 week(s) ? ?The format for your next appointment:   ?In Person ? ?Provider:   ?You may see Dietrich Pates, MD or one of the following Advanced Practice Providers on your designated Care Team:   ?Randall An, PA-C  ?Jacolyn Reedy, PA-C   ? ? ?Other Instructions ?Thank you for choosing  [facility]! ? ? ? ?Important Information About Sugar ? ? ? ? ? ? ?

## 2022-04-22 NOTE — Addendum Note (Signed)
Addended by: Dennis Bast F on: 04/22/2022 04:59 PM ? ? Modules accepted: Orders ? ?

## 2022-04-23 NOTE — Patient Instructions (Signed)
? Your procedure is scheduled on: 04/26/2022 ? Report to [facility] Main Entrance at   9:30  AM. ? Call this number if you have problems the morning of surgery: [phone] ? ? Remember: ? ? Do not Eat or Drink after midnight  ? ?      No Smoking the morning of surgery ? : ? Take these medicines the morning of surgery with A SIP OF WATER: Diltiazem, Eliquis  and Flomax ? ?            Do not miss any doses of Eliquis ? ? Do not wear jewelry, make-up or nail polish. ? Do not wear lotions, powders, or perfumes. You may wear deodorant. ? Do not shave 48 hours prior to surgery. Men may shave face and neck. ? Do not bring valuables to the hospital. ? Contacts, dentures or bridgework may not be worn into surgery. ? Leave suitcase in the car. After surgery it may be brought to your room. ? For patients admitted to the hospital, checkout time is 11:00 AM the day of discharge. ? ? Patients discharged the day of surgery will not be allowed to drive home. ?  ? Electrical Cardioversion ?Electrical cardioversion is the delivery of a jolt of electricity to restore a normal rhythm to the heart. A rhythm that is too fast or is not regular keeps the heart from pumping well. In this procedure, sticky patches or metal paddles are placed on the chest to deliver electricity to the heart from a device. ?This procedure may be done in an emergency if: ?There is low or no blood pressure as a result of the heart rhythm. ?Normal rhythm must be restored as fast as possible to protect the brain and heart from further damage. ?It may save a life. ?This may also be a scheduled procedure for irregular or fast heart rhythms that are not immediately life-threatening. ?Tell a health care provider about: ?Any allergies you have. ?All medicines you are taking, including vitamins, herbs, eye drops, creams, and over-the-counter medicines. ?Any problems you or family members have had with anesthetic medicines. ?Any blood disorders you have. ?Any surgeries  you have had. ?Any medical conditions you have. ?Whether you are pregnant or may be pregnant. ?What are the risks? ?Generally, this is a safe procedure. However, problems may occur, including: ?Allergic reactions to medicines. ?A blood clot that breaks free and travels to other parts of your body. ?The possible return of an abnormal heart rhythm within hours or days after the procedure. ?Your heart stopping (cardiac arrest). This is rare. ?What happens before the procedure? ?Medicines ?Your health care provider may have you start taking: ?Blood-thinning medicines (anticoagulants) so your blood does not clot as easily. ?Medicines to help stabilize your heart rate and rhythm. ?Ask your health care provider about: ?Changing or stopping your regular medicines. This is especially important if you are taking diabetes medicines or blood thinners. ?Taking medicines such as aspirin and ibuprofen. These medicines can thin your blood. Do not take these medicines unless your health care provider tells you to take them. ?Taking over-the-counter medicines, vitamins, herbs, and supplements. ?General instructions ?Follow instructions from your health care provider about eating or drinking restrictions. ?Plan to have someone take you home from the hospital or clinic. ?If you will be going home right after the procedure, plan to have someone with you for 24 hours. ?Ask your health care provider what steps will be taken to help prevent infection. These may include washing your  skin with a germ-killing soap. ?What happens during the procedure? ? ?An IV will be inserted into one of your veins. ?Sticky patches (electrodes) or metal paddles may be placed on your chest. ?You will be given a medicine to help you relax (sedative). ?An electrical shock will be delivered. ?The procedure may vary among health care providers and hospitals. ?What can I expect after the procedure? ?Your blood pressure, heart rate, breathing rate, and blood oxygen  level will be monitored until you leave the hospital or clinic. ?Your heart rhythm will be watched to make sure it does not change. ?You may have some redness on the skin where the shocks were given. ?Follow these instructions at home: ?Do not drive for 24 hours if you were given a sedative during your procedure. ?Take over-the-counter and prescription medicines only as told by your health care provider. ?Ask your health care provider how to check your pulse. Check it often. ?Rest for 48 hours after the procedure or as told by your health care provider. ?Avoid or limit your caffeine use as told by your health care provider. ?Keep all follow-up visits as told by your health care provider. This is important. ?Contact a health care provider if: ?You feel like your heart is beating too quickly or your pulse is not regular. ?You have a serious muscle cramp that does not go away. ?Get help right away if: ?You have discomfort in your chest. ?You are dizzy or you feel faint. ?You have trouble breathing or you are short of breath. ?Your speech is slurred. ?You have trouble moving an arm or leg on one side of your body. ?Your fingers or toes turn cold or blue. ?Summary ?Electrical cardioversion is the delivery of a jolt of electricity to restore a normal rhythm to the heart. ?This procedure may be done right away in an emergency or may be a scheduled procedure if the condition is not an emergency. ?Generally, this is a safe procedure. ?After the procedure, check your pulse often as told by your health care provider. ?This information is not intended to replace advice given to you by your health care provider. Make sure you discuss any questions you have with your health care provider. ?Document Revised: 11/15/2021 Document Reviewed: 07/19/2019 ?Elsevier Patient Education ? 2023 [employer] ?General Anesthesia, Adult, Care After ?This sheet gives you information about how to care for yourself after your procedure. Your  health care provider may also give you more specific instructions. If you have problems or questions, contact your health care provider. ?What can I expect after the procedure? ?After the procedure, the following side effects are common: ?Pain or discomfort at the IV site. ?Nausea. ?Vomiting. ?Sore throat. ?Trouble concentrating. ?Feeling cold or chills. ?Feeling weak or tired. ?Sleepiness and fatigue. ?Soreness and body aches. These side effects can affect parts of the body that were not involved in surgery. ?Follow these instructions at home: ?For the time period you were told by your health care provider: ? ?Rest. ?Do not participate in activities where you could fall or become injured. ?Do not drive or use machinery. ?Do not drink alcohol. ?Do not take sleeping pills or medicines that cause drowsiness. ?Do not make important decisions or sign legal documents. ?Do not take care of children on your own. ?Eating and drinking ?Follow any instructions from your health care provider about eating or drinking restrictions. ?When you feel hungry, start by eating small amounts of foods that are soft and easy to digest (bland), such as  toast. Gradually return to your regular diet. ?Drink enough fluid to keep your urine pale yellow. ?If you vomit, rehydrate by drinking water, juice, or clear broth. ?General instructions ?If you have sleep apnea, surgery and certain medicines can increase your risk for breathing problems. Follow instructions from your health care provider about wearing your sleep device: ?Anytime you are sleeping, including during daytime naps. ?While taking prescription pain medicines, sleeping medicines, or medicines that make you drowsy. ?Have a responsible adult stay with you for the time you are told. It is important to have someone help care for you until you are awake and alert. ?Return to your normal activities as told by your health care provider. Ask your health care provider what activities are  safe for you. ?Take over-the-counter and prescription medicines only as told by your health care provider. ?If you smoke, do not smoke without supervision. ?Keep all follow-up visits as told by your health

## 2022-04-24 ENCOUNTER — Encounter (HOSPITAL_COMMUNITY)
Admission: RE | Admit: 2022-04-24 | Discharge: 2022-04-24 | Disposition: A | Discharge status: Home / Self Care | Payer: Medicare HMO | Source: Ambulatory Visit | Attending: Cardiology | Admitting: Cardiology

## 2022-04-24 ENCOUNTER — Encounter (HOSPITAL_COMMUNITY): Payer: Self-pay

## 2022-04-24 VITALS — BP 158/75 | HR 66 | Temp 98.7°F | Resp 18 | Ht 70.0 in | Wt 242.0 lb

## 2022-04-24 DIAGNOSIS — Z01812 Encounter for preprocedural laboratory examination: Secondary | ICD-10-CM | POA: Diagnosis not present

## 2022-04-24 DIAGNOSIS — I4892 Unspecified atrial flutter: Secondary | ICD-10-CM | POA: Diagnosis not present

## 2022-04-24 HISTORY — DX: Unspecified atrial flutter: I48.92

## 2022-04-24 HISTORY — DX: Cardiac arrhythmia, unspecified: I49.9

## 2022-04-24 HISTORY — DX: Personal history of urinary calculi: Z87.442

## 2022-04-24 LAB — BASIC METABOLIC PANEL
Anion gap: 7 (ref 5–15)
BUN: 22 mg/dL (ref 8–23)
CO2: 27 mmol/L (ref 22–32)
Calcium: 8.8 mg/dL — ABNORMAL LOW (ref 8.9–10.3)
Chloride: 106 mmol/L (ref 98–111)
Creatinine, Ser: 1.58 mg/dL — ABNORMAL HIGH (ref 0.61–1.24)
GFR, Estimated: 42 mL/min — ABNORMAL LOW (ref >60)
Glucose, Bld: 94 mg/dL (ref 70–99)
Potassium: 4.7 mmol/L (ref 3.5–5.1)
Sodium: 140 mmol/L (ref 135–145)

## 2022-04-24 LAB — PROTIME-INR
INR: 1.3 — ABNORMAL HIGH (ref 0.8–1.2)
Prothrombin Time: 15.7 seconds — ABNORMAL HIGH (ref 11.4–15.2)

## 2022-04-26 ENCOUNTER — Other Ambulatory Visit: Payer: Self-pay

## 2022-04-26 ENCOUNTER — Ambulatory Visit (HOSPITAL_COMMUNITY): Payer: Medicare HMO | Admitting: Anesthesiology

## 2022-04-26 ENCOUNTER — Encounter (HOSPITAL_COMMUNITY)
Admission: RE | Disposition: A | Discharge status: Home / Self Care | Payer: Self-pay | Source: Home / Self Care | Attending: Cardiology

## 2022-04-26 ENCOUNTER — Ambulatory Visit (HOSPITAL_COMMUNITY)
Admission: RE | Admit: 2022-04-26 | Discharge: 2022-04-26 | Disposition: A | Discharge status: Home / Self Care | Payer: Medicare HMO | Attending: Cardiology | Admitting: Cardiology

## 2022-04-26 ENCOUNTER — Ambulatory Visit (HOSPITAL_BASED_OUTPATIENT_CLINIC_OR_DEPARTMENT_OTHER): Payer: Medicare HMO | Admitting: Anesthesiology

## 2022-04-26 ENCOUNTER — Encounter (HOSPITAL_COMMUNITY): Payer: Self-pay | Admitting: Cardiology

## 2022-04-26 DIAGNOSIS — I252 Old myocardial infarction: Secondary | ICD-10-CM

## 2022-04-26 DIAGNOSIS — K219 Gastro-esophageal reflux disease without esophagitis: Secondary | ICD-10-CM | POA: Insufficient documentation

## 2022-04-26 DIAGNOSIS — Z79899 Other long term (current) drug therapy: Secondary | ICD-10-CM | POA: Insufficient documentation

## 2022-04-26 DIAGNOSIS — Z87891 Personal history of nicotine dependence: Secondary | ICD-10-CM

## 2022-04-26 DIAGNOSIS — I483 Typical atrial flutter: Secondary | ICD-10-CM | POA: Diagnosis not present

## 2022-04-26 DIAGNOSIS — I110 Hypertensive heart disease with heart failure: Secondary | ICD-10-CM | POA: Diagnosis not present

## 2022-04-26 DIAGNOSIS — J449 Chronic obstructive pulmonary disease, unspecified: Secondary | ICD-10-CM | POA: Diagnosis not present

## 2022-04-26 DIAGNOSIS — I509 Heart failure, unspecified: Secondary | ICD-10-CM

## 2022-04-26 DIAGNOSIS — I4892 Unspecified atrial flutter: Secondary | ICD-10-CM

## 2022-04-26 DIAGNOSIS — I4891 Unspecified atrial fibrillation: Secondary | ICD-10-CM | POA: Insufficient documentation

## 2022-04-26 DIAGNOSIS — I129 Hypertensive chronic kidney disease with stage 1 through stage 4 chronic kidney disease, or unspecified chronic kidney disease: Secondary | ICD-10-CM | POA: Diagnosis not present

## 2022-04-26 DIAGNOSIS — N189 Chronic kidney disease, unspecified: Secondary | ICD-10-CM | POA: Insufficient documentation

## 2022-04-26 DIAGNOSIS — Z7901 Long term (current) use of anticoagulants: Secondary | ICD-10-CM | POA: Insufficient documentation

## 2022-04-26 HISTORY — PX: CARDIOVERSION: SHX1299

## 2022-04-26 SURGERY — CARDIOVERSION
Anesthesia: General

## 2022-04-26 MED ORDER — CHLORHEXIDINE GLUCONATE 0.12 % MT SOLN: 15.0000 mL | Freq: Once | OROMUCOSAL | Status: DC

## 2022-04-26 MED ORDER — PROPOFOL 10 MG/ML IV BOLUS
INTRAVENOUS | Status: DC | PRN
  Administered 2022-04-26: 60 mg via INTRAVENOUS

## 2022-04-26 MED ORDER — SODIUM CHLORIDE 0.9 % IV SOLN: INTRAVENOUS | Status: DC

## 2022-04-26 MED ORDER — ORAL CARE MOUTH RINSE: 15.0000 mL | Freq: Once | OROMUCOSAL | Status: DC

## 2022-04-26 MED ORDER — ATENOLOL 25 MG PO TABS: 25.0000 mg | ORAL_TABLET | Freq: Every day | ORAL | 3 refills | Status: DC

## 2022-04-26 MED ORDER — LACTATED RINGERS IV SOLN: INTRAVENOUS | Status: DC | PRN

## 2022-04-26 MED ORDER — LACTATED RINGERS IV SOLN: INTRAVENOUS | Status: DC

## 2022-04-26 SURGICAL SUPPLY — 2 items
GLOVE BIOGEL PI IND STRL 7.0 (GLOVE) ×2 IMPLANT
GLOVE BIOGEL PI INDICATOR 7.0 (GLOVE) ×2

## 2022-04-26 NOTE — Anesthesia Procedure Notes (Signed)
Date/Time: 04/26/2022 10:40 AM ?Performed by: Lorin Glass, CRNA ?Pre-anesthesia Checklist: Patient identified, Emergency Drugs available, Suction available and Patient being monitored ?Oxygen Delivery Method: Nasal cannula ? ? ? ? ?

## 2022-04-26 NOTE — CV Procedure (Signed)
Elective direct-current cardioversion ? ?Indication: Persistent atrial flutter ? ?Description of procedure: After informed consent was obtained the patient was taken to the PACU where a timeout was performed.  Anterior and posterior pads were placed in standard fashion and connected to a biphasic defibrillator.  Deep sedation was achieved with propofol, administered and monitored by the anesthesia service.  With sandbag on the anterior chest pad, a single, synchronized 120 J shock was delivered with successful restoration of sinus rhythm.  Patient remained hemodynamically stable throughout and there were no immediate complications.  Sinus rhythm confirmed by ECG. ? ?Disposition: After appropriate monitoring patient will be discharged for scheduled outpatient follow-up.  Plan to reduce atenolol dose. ? ?Jonelle Sidle, M.D., F.A.C.C.  ?

## 2022-04-26 NOTE — Anesthesia Preprocedure Evaluation (Addendum)
Anesthesia Evaluation  ?Patient identified by MRN, date of birth, ID band ?Patient awake ? ? ? ?Reviewed: ?Allergy & Precautions, NPO status , Patient's Chart, lab work & pertinent test results ? ?History of Anesthesia Complications ?(+) POST - OP SPINAL HEADACHE and history of anesthetic complications ? ?Airway ?Mallampati: II ? ?TM Distance: >3 FB ?Neck ROM: Full ? ? ? Dental ? ?(+) Dental Advisory Given, Missing ?  ?Pulmonary ?COPD,  COPD inhaler, former smoker,  ?  ?Pulmonary exam normal ?breath sounds clear to auscultation ? ? ? ? ? ? Cardiovascular ?Exercise Tolerance: Good ?hypertension, Pt. on medications ?+ Past MI and +CHF  ?+ dysrhythmias Atrial Fibrillation  ?Rhythm:Irregular Rate:Abnormal ? ? ?  ?Neuro/Psych ? Headaches, negative psych ROS  ? GI/Hepatic ?negative GI ROS, Neg liver ROS,   ?Endo/Other  ?negative endocrine ROS ? Renal/GU ?negative Renal ROS  ?negative genitourinary ?  ?Musculoskeletal ?negative musculoskeletal ROS ?(+)  ? Abdominal ?  ?Peds ?negative pediatric ROS ?(+)  Hematology ?negative hematology ROS ?(+)   ?Anesthesia Other Findings ? ? Reproductive/Obstetrics ?negative OB ROS ? ?  ? ? ? ? ? ? ? ? ? ? ? ? ? ?  ?  ? ? ? ? ? ? ? ?Anesthesia Physical ?Anesthesia Plan ? ?ASA: 3 ? ?Anesthesia Plan: General  ? ?Post-op Pain Management: Minimal or no pain anticipated  ? ?Induction: Intravenous ? ?PONV Risk Score and Plan: TIVA ? ?Airway Management Planned: Nasal Cannula and Natural Airway ? ?Additional Equipment:  ? ?Intra-op Plan:  ? ?Post-operative Plan:  ? ?Informed Consent: I have reviewed the patients History and Physical, chart, labs and discussed the procedure including the risks, benefits and alternatives for the proposed anesthesia with the patient or authorized representative who has indicated his/her understanding and acceptance.  ? ? ? ?Dental advisory given ? ?Plan Discussed with: CRNA and Surgeon ? ?Anesthesia Plan Comments:    ? ? ? ? ? ? ?Anesthesia Quick Evaluation ? ?

## 2022-04-26 NOTE — Anesthesia Postprocedure Evaluation (Signed)
Anesthesia Post Note ? ?Patient: Timothy Mata ? ?Procedure(s) Performed: CARDIOVERSION ? ?Patient location during evaluation: PACU ?Anesthesia Type: General ?Level of consciousness: awake and alert and oriented ?Pain management: pain level controlled ?Vital Signs Assessment: post-procedure vital signs reviewed and stable ?Respiratory status: spontaneous breathing, nonlabored ventilation and respiratory function stable ?Cardiovascular status: blood pressure returned to baseline and stable ?Postop Assessment: no apparent nausea or vomiting ?Anesthetic complications: no ? ? ?No notable events documented. ? ? ?Last Vitals:  ?Vitals:  ? 04/26/22 1145 04/26/22 1200  ?BP: 136/69 135/63  ?Pulse: 63 64  ?Resp: 18 13  ?Temp: 36.7 ?C 36.6 ?C  ?SpO2: 95%   ?  ?Last Pain:  ?Vitals:  ? 04/26/22 1200  ?TempSrc: Oral  ?PainSc: 0-No pain  ? ? ?  ?  ?  ?  ?  ?  ? ?[name] ? ? ? ? ?

## 2022-04-26 NOTE — Interval H&P Note (Signed)
History and Physical Interval Note: ? ?04/26/2022 ?10:20 AM ? ?Itay Mella  has presented today for surgery, with the diagnosis of a-flutter.  The various methods of treatment have been discussed with the patient and family. After consideration of risks, benefits and other options for treatment, the patient has consented to  Procedure(s): ?CARDIOVERSION (N/A) as a surgical intervention.  The patient's history has been reviewed, patient examined, no change in status, stable for surgery.  I have reviewed the patient's chart and labs.  Questions were answered to the patient's satisfaction.   ? ?Procedure scheduled by Dr. Tenny Craw as of recent office visit on April 21.  He has had persistent atrial flutter, compliant with Eliquis for stroke prophylaxis, and on atenolol for heart rate control.  LVEF 60 to 65% by echocardiogram.  Creatinine 1.58 which is down from last check. ? ?Nona Dell ? ? ?

## 2022-04-26 NOTE — Progress Notes (Signed)
Electrical Cardioversion Procedure Note ?Timothy Mata ?[id number] ?08/21/1933 ? ?Procedure: Electrical Cardioversion ?Indications:  Atrial Flutter ? ?Procedure Details ?Consent: Risks of procedure as well as the alternatives and risks of each were explained to the (patient/caregiver).  Consent for procedure obtained. ?Time Out: Verified patient identification, verified procedure, site/side was marked, verified correct patient position, special equipment/implants available, medications/allergies/relevent history reviewed, required imaging and test results available.  Performed ? ?Patient placed on cardiac monitor, pulse oximetry, supplemental oxygen as necessary.  ?Sedation given:  propofol ?Pacer pads placed anterior and posterior chest. ? ?Cardioverted 1 time(s).  ?Cardioverted at 120J. ? ?Evaluation ?Findings: Post procedure EKG shows: NSR ?Complications: None ?Patient did tolerate procedure well. ? ? ?Manley Mason Timothy Mata ?04/26/2022, 11:10 AM ? ? ? ?

## 2022-04-26 NOTE — Transfer of Care (Signed)
Immediate Anesthesia Transfer of Care Note ? ?Patient: Timothy Mata ? ?Procedure(s) Performed: CARDIOVERSION ? ?Patient Location: PACU ? ?Anesthesia Type:General ? ?Level of Consciousness: drowsy ? ?Airway & Oxygen Therapy: Patient Spontanous Breathing and Patient connected to nasal cannula oxygen ? ?Post-op Assessment: Report given to RN and Post -op Vital signs reviewed and stable ? ?Post vital signs: Reviewed and stable ? ?Last Vitals:  ?Vitals Value Taken Time  ?BP 121/61   ?Temp 98.3   ?Pulse 97%   ?Resp 20   ?SpO2 97%   ? ? ?Last Pain:  ?Vitals:  ? 04/26/22 1009  ?TempSrc: Oral  ?PainSc: 0-No pain  ?   ? ?  ? ?Complications: No notable events documented. ?

## 2022-04-30 ENCOUNTER — Encounter (HOSPITAL_COMMUNITY): Payer: Self-pay | Admitting: Cardiology

## 2022-05-08 ENCOUNTER — Encounter: Payer: Self-pay | Admitting: Student

## 2022-05-08 ENCOUNTER — Ambulatory Visit: Payer: Medicare HMO | Admitting: Student

## 2022-05-08 VITALS — BP 128/60 | HR 72 | Ht 70.0 in | Wt 245.6 lb

## 2022-05-08 DIAGNOSIS — I252 Old myocardial infarction: Secondary | ICD-10-CM | POA: Diagnosis not present

## 2022-05-08 DIAGNOSIS — E785 Hyperlipidemia, unspecified: Secondary | ICD-10-CM

## 2022-05-08 DIAGNOSIS — I480 Paroxysmal atrial fibrillation: Secondary | ICD-10-CM | POA: Diagnosis not present

## 2022-05-08 DIAGNOSIS — I10 Essential (primary) hypertension: Secondary | ICD-10-CM

## 2022-05-08 DIAGNOSIS — N1832 Chronic kidney disease, stage 3b: Secondary | ICD-10-CM

## 2022-05-08 MED ORDER — DILTIAZEM HCL ER COATED BEADS 240 MG PO CP24: 240.0000 mg | ORAL_CAPSULE | Freq: Every day | ORAL | 3 refills | Status: DC

## 2022-05-08 MED ORDER — ATORVASTATIN CALCIUM 40 MG PO TABS: 40.0000 mg | ORAL_TABLET | Freq: Every day | ORAL | 3 refills | Status: DC

## 2022-05-08 MED ORDER — APIXABAN 2.5 MG PO TABS: 2.5000 mg | ORAL_TABLET | Freq: Two times a day (BID) | ORAL | 11 refills | Status: DC

## 2022-05-08 MED ORDER — ATENOLOL 25 MG PO TABS: 25.0000 mg | ORAL_TABLET | Freq: Every day | ORAL | 3 refills | Status: DC

## 2022-05-08 NOTE — Patient Instructions (Addendum)
Medication Instructions:  ?Your physician recommends that you continue on your current medications as directed. Please refer to the Current Medication list given to you today. ? ?Labwork: ?None ? ?Testing/Procedures: ?none ? ?Follow-Up: ?Your physician recommends that you schedule a follow-up appointment in: 3-4 months ? ?Any Other Special Instructions Will Be Listed Below (If Applicable). ? ?If you need a refill on your cardiac medications before your next appointment, please call your pharmacy. ?

## 2022-05-08 NOTE — Progress Notes (Signed)
? ?Cardiology Office Note   ? ?Date:  05/08/2022  ? ?ID:  Timothy Mata, DOB Aug 16, 1933, MRN [id number] ? ?PCP:  Royann Shivers, PA-C  ?Cardiologist: Dietrich Pates, MD   ? ?Chief Complaint  ?Patient presents with  ? Follow-up  ?  S/p DCCV  ? ? ?History of Present Illness:   ? ?Timothy Mata is a 88 y.o. male with past medical history of persistent atrial flutter (diagnosed in 02/2022), HTN, HLD, BPH and COPD who presents to the office today for follow-up from his recent cardioversion. ? ?He was initially admitted to [facility] in 02/2022 for evaluation of chest pain and dyspnea, found to be in atrial flutter with RVR. He did have elevated troponin values which peaked at 689 and were overall felt to be secondary to demand ischemia in the setting of atrial flutter with RVR.  Echocardiogram showed a preserved EF of 60 to 65% with no regional motion abnormalities.  He was started on Cardizem CD 240 mg daily, Atenolol 50 mg daily and Eliquis 2.5 mg twice daily with plans for outpatient DCCV. He did see Dr. Tenny Craw in clinic on 04/19/2022 and was still in atrial flutter with elevated rates, therefore Atenolol was titrated to 75 mg daily and DCCV was arranged.  Was performed by Dr. Diona Browner on 04/26/2022 and he converted back to normal sinus rhythm with 120 J shock. Atenolol was reduced to 25 mg daily following the procedure. ? ?In talking with the patient today, he reports overall feeling well since his recent procedure. Says he was asymptomatic with his arrhythmia prior to the procedure and did not notice any change afterwards. He remains active at baseline for his age and enjoys doing yard work. He denies any recent chest pain or palpitations. Has baseline dyspnea on exertion but no acute changes in this or associated orthopnea or PND.  ? ?He has been checking his vitals at home and his BP has been well-controlled and HR has been in the 60's to 80's.  ? ? ?Past Medical History:  ?Diagnosis Date  ? Atrial flutter (HCC)   ? a.  s/p DCCV in 03/2022 b. repeat EKG at follow-up from DCCV showing atrial fibrillation  ? BPH (benign prostatic hyperplasia)   ? COPD (chronic obstructive pulmonary disease) (HCC)   ? Dysrhythmia   ? Gout   ? "left big toe"  ? History of kidney stones   ? HOH (hard of hearing)   ? Hypercholesterolemia   ? Hypertension   ? Preretinal fibrosis, left eye   ? Spinal headache 12/30/1972  ? ? ?Past Surgical History:  ?Procedure Laterality Date  ? 25 GAUGE PARS PLANA VITRECTOMY WITH 20 GAUGE MVR PORT Left 07/18/2015  ? Procedure: 25 GAUGE PARS PLANA VITRECTOMY WITH 20 GAUGE MVR PORT;  Surgeon: Sherrie George, MD;  Location: [facility] OR;  Service: Ophthalmology;  Laterality: Left;  ? AIR/FLUID EXCHANGE Left 07/18/2015  ? Procedure: AIR/FLUID EXCHANGE;  Surgeon: Sherrie George, MD;  Location: [facility] OR;  Service: Ophthalmology;  Laterality: Left;  ? BACK SURGERY    ? CARDIOVERSION N/A 04/26/2022  ? Procedure: CARDIOVERSION;  Surgeon: Jonelle Sidle, MD;  Location: [facility] OR[facility];  Service: Cardiovascular;  Laterality: N/A;  ? EYE SURGERY    ? FOOT FRACTURE SURGERY Right 1981  ? LASER PHOTO ABLATION Left 07/18/2015  ? Procedure: LASER PHOTO ABLATION;  Surgeon: Sherrie George, MD;  Location: [facility] OR;  Service: Ophthalmology;  Laterality: Left;  head scope ?  ? LUMBAR DISC  SURGERY  1974  ? MEMBRANE PEEL Left 07/18/2015  ? Procedure: MEMBRANE PEEL;  Surgeon: Sherrie George, MD;  Location: [facility] OR;  Service: Ophthalmology;  Laterality: Left;  ? MULTIPLE TOOTH EXTRACTIONS  1990's  ? PARS PLANA VITRECTOMY Left 07/18/2015  ? ? ?Current Medications: ?Outpatient Medications Prior to Visit  ?Medication Sig Dispense Refill  ? allopurinol (ZYLOPRIM) 300 MG tablet Take 300 mg by mouth daily after supper.   2  ? aspirin 81 MG tablet Take 1 tablet (81 mg total) by mouth daily. 30 tablet 2  ? Fluticasone-Salmeterol (ADVAIR) 250-50 MCG/DOSE AEPB Inhale 1 puff into the lungs 2 (two) times daily.    ? hydrocortisone cream 1 % Apply 1 application. topically 2 (two)  times daily as needed for itching.    ? nitroGLYCERIN (NITROSTAT) 0.4 MG SL tablet Place 1 tablet (0.4 mg total) under the tongue every 5 (five) minutes x 3 doses as needed for chest pain. 10 tablet 12  ? tamsulosin (FLOMAX) 0.4 MG CAPS capsule Take 1 capsule (0.4 mg total) by mouth daily after supper. 30 capsule 3  ? apixaban (ELIQUIS) 2.5 MG TABS tablet Take 1 tablet (2.5 mg total) by mouth 2 (two) times daily. 60 tablet 2  ? atenolol (TENORMIN) 25 MG tablet Take 1 tablet (25 mg total) by mouth daily. Take 25 mg in the AM. 30 tablet 3  ? atorvastatin (LIPITOR) 40 MG tablet Take 1 tablet (40 mg total) by mouth daily. 30 tablet 2  ? diltiazem (CARDIZEM CD) 240 MG 24 hr capsule Take 1 capsule (240 mg total) by mouth daily. 30 capsule 2  ? ?No facility-administered medications prior to visit.  ?  ? ?Allergies:   Patient has no known allergies.  ? ?Social History  ? ?Socioeconomic History  ? Marital status: Married  ?  Spouse name: Not on file  ? Number of children: Not on file  ? Years of education: Not on file  ? Highest education level: Not on file  ?Occupational History  ? Not on file  ?Tobacco Use  ? Smoking status: Former  ?  Packs/day: 1.00  ?  Years: 32.00  ?  Pack years: 32.00  ?  Types: Cigarettes  ? Smokeless tobacco: Never  ? Tobacco comments:  ?  quit smoking at age 79  ?Substance and Sexual Activity  ? Alcohol use: No  ? Drug use: No  ? Sexual activity: Not on file  ?Other Topics Concern  ? Not on file  ?Social History Narrative  ? Not on file  ? ?Social Determinants of Health  ? ?Financial Resource Strain: Not on file  ?Food Insecurity: Not on file  ?Transportation Needs: Not on file  ?Physical Activity: Not on file  ?Stress: Not on file  ?Social Connections: Not on file  ?  ? ?Family History:  The patient's family history includes Cancer in his father.  ? ?Review of Systems:   ? ?Please see the history of present illness.    ? ?All other systems reviewed and are otherwise negative except as noted  above. ? ? ?Physical Exam:   ? ?VS:  BP 128/60   Pulse 72   Ht 5\' 10"  (1.778 m)   Wt 245 lb 9.6 oz (111.4 kg)   SpO2 98%   BMI 35.24 kg/m?    ?General: Pleasant elderly male appearing in no acute distress. ?Head: Normocephalic, atraumatic. ?Neck: No carotid bruits. JVD not elevated.  ?Lungs: Respirations regular and unlabored, without wheezes or rales.  ?  Heart: Irregularly irregular. No S3 or S4.  No murmur, no rubs, or gallops appreciated. ?Abdomen: Appears non-distended. No obvious abdominal masses. ?Msk:  Strength and tone appear normal for age. No obvious joint deformities or effusions. ?Extremities: No clubbing or cyanosis. Trace lower extremity edema bilaterally.  Distal pedal pulses are 2+ bilaterally. ?Neuro: Alert and oriented X 3. Moves all extremities spontaneously. No focal deficits noted. ?Psych:  Responds to questions appropriately with a normal affect. ?Skin: No rashes or lesions noted ? ?Wt Readings from Last 3 Encounters:  ?05/08/22 245 lb 9.6 oz (111.4 kg)  ?04/26/22 240 lb 4.8 oz (109 kg)  ?04/24/22 242 lb (109.8 kg)  ?  ? ?Studies/Labs Reviewed:  ? ?EKG:  EKG is ordered today.  The ekg ordered today demonstrates rate-controlled atrial fibrillation, HR 72. ? ?Recent Labs: ?03/25/2022: TSH 3.307 ?03/27/2022: Hemoglobin 13.9; Platelets 229 ?04/24/2022: BUN 22; Creatinine, Ser 1.58; Potassium 4.7; Sodium 140  ? ?Lipid Panel ?   ?Component Value Date/Time  ? CHOL 143 03/25/2022 0117  ? TRIG 115 03/25/2022 0117  ? HDL 44 03/25/2022 0117  ? CHOLHDL 3.3 03/25/2022 0117  ? VLDL 23 03/25/2022 0117  ? LDLCALC 76 03/25/2022 0117  ? ? ?Additional studies/ records that were reviewed today include:  ? ?Echocardiogram: 02/2022 ?IMPRESSIONS  ? ? ? 1. Left ventricular ejection fraction, by estimation, is 60 to 65%. The  ?left ventricle has normal function. The left ventricle has no regional  ?wall motion abnormalities. Left ventricular diastolic function could not  ?be evaluated.  ? 2. Right ventricular systolic  function is normal. The right ventricular  ?size is normal.  ? 3. The mitral valve is normal in structure. Trivial mitral valve  ?regurgitation. No evidence of mitral stenosis.  ? 4. The aortic valve is tricusp

## 2022-05-13 ENCOUNTER — Encounter: Payer: Self-pay | Admitting: *Deleted

## 2022-06-27 ENCOUNTER — Telehealth: Payer: Self-pay | Admitting: Internal Medicine

## 2022-06-27 MED ORDER — METOPROLOL SUCCINATE ER 25 MG PO TB24: 25.0000 mg | ORAL_TABLET | Freq: Every day | ORAL | 3 refills | Status: DC

## 2022-06-27 MED ORDER — METOPROLOL SUCCINATE ER 100 MG PO TB24: 100.0000 mg | ORAL_TABLET | Freq: Every morning | ORAL | 3 refills | Status: DC

## 2022-06-27 NOTE — Telephone Encounter (Addendum)
HHN seeing patient post lawn mower accident on 5/23 which resulted in fractured ribs and hemothorax after flipping lawn mower.   HHN noted BP today 110/74, HR 130's    Taking diltiazem 240 mg and atenolol 25 mg qd    Update: kim, HHN, patient is not on diltiazem or atenolol. He is on Toprol XL 100 mg qd and lasix 40 mg qd

## 2022-06-27 NOTE — Telephone Encounter (Signed)
I spoke with wife. She will have Mr.Morelos take an additional Toprol xl 25 mg in the evening. They will keep August 17 appointment with B.Strader,PA-C

## 2022-06-27 NOTE — Telephone Encounter (Signed)
Patient c/o Palpitations:  High priority if patient c/o lightheadedness, shortness of breath, or chest pain  How long have you had palpitations/irregular HR/ Afib? Are you having the symptoms now?  Since yester  Are you currently experiencing lightheadedness, SOB or CP?   No  Do you have a history of afib (atrial fibrillation) or irregular heart rhythm?  Yes  Have you checked your BP or HR? (document readings if available):     HR 137 but fluctuating  Are you experiencing any other symptoms?    Concern about fluctuating heart rate
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
## 2022-07-04 ENCOUNTER — Telehealth: Payer: Self-pay | Admitting: Internal Medicine

## 2022-07-04 ENCOUNTER — Inpatient Hospital Stay (HOSPITAL_COMMUNITY)
Admission: EM | Admit: 2022-07-04 | Discharge: 2022-07-08 | DRG: 309 | Disposition: A | Discharge status: Home Health | Payer: Medicare HMO | Attending: Family Medicine | Admitting: Family Medicine

## 2022-07-04 ENCOUNTER — Emergency Department (HOSPITAL_COMMUNITY): Payer: Medicare HMO

## 2022-07-04 ENCOUNTER — Encounter (HOSPITAL_COMMUNITY): Payer: Self-pay

## 2022-07-04 ENCOUNTER — Other Ambulatory Visit: Payer: Self-pay

## 2022-07-04 DIAGNOSIS — N179 Acute kidney failure, unspecified: Secondary | ICD-10-CM | POA: Diagnosis present

## 2022-07-04 DIAGNOSIS — H9190 Unspecified hearing loss, unspecified ear: Secondary | ICD-10-CM | POA: Diagnosis present

## 2022-07-04 DIAGNOSIS — I700 Atherosclerosis of aorta: Secondary | ICD-10-CM | POA: Diagnosis present

## 2022-07-04 DIAGNOSIS — I4892 Unspecified atrial flutter: Secondary | ICD-10-CM | POA: Diagnosis present

## 2022-07-04 DIAGNOSIS — I951 Orthostatic hypotension: Secondary | ICD-10-CM | POA: Diagnosis present

## 2022-07-04 DIAGNOSIS — Z87442 Personal history of urinary calculi: Secondary | ICD-10-CM

## 2022-07-04 DIAGNOSIS — E876 Hypokalemia: Secondary | ICD-10-CM

## 2022-07-04 DIAGNOSIS — E7800 Pure hypercholesterolemia, unspecified: Secondary | ICD-10-CM | POA: Diagnosis present

## 2022-07-04 DIAGNOSIS — D6859 Other primary thrombophilia: Secondary | ICD-10-CM | POA: Diagnosis present

## 2022-07-04 DIAGNOSIS — R8290 Unspecified abnormal findings in urine: Secondary | ICD-10-CM | POA: Diagnosis present

## 2022-07-04 DIAGNOSIS — I483 Typical atrial flutter: Principal | ICD-10-CM | POA: Diagnosis present

## 2022-07-04 DIAGNOSIS — I130 Hypertensive heart and chronic kidney disease with heart failure and stage 1 through stage 4 chronic kidney disease, or unspecified chronic kidney disease: Secondary | ICD-10-CM | POA: Diagnosis present

## 2022-07-04 DIAGNOSIS — I480 Paroxysmal atrial fibrillation: Secondary | ICD-10-CM | POA: Diagnosis present

## 2022-07-04 DIAGNOSIS — N1831 Chronic kidney disease, stage 3a: Secondary | ICD-10-CM | POA: Diagnosis present

## 2022-07-04 DIAGNOSIS — Z79899 Other long term (current) drug therapy: Secondary | ICD-10-CM

## 2022-07-04 DIAGNOSIS — J449 Chronic obstructive pulmonary disease, unspecified: Secondary | ICD-10-CM

## 2022-07-04 DIAGNOSIS — J441 Chronic obstructive pulmonary disease with (acute) exacerbation: Secondary | ICD-10-CM | POA: Diagnosis present

## 2022-07-04 DIAGNOSIS — R946 Abnormal results of thyroid function studies: Secondary | ICD-10-CM | POA: Diagnosis present

## 2022-07-04 DIAGNOSIS — E1122 Type 2 diabetes mellitus with diabetic chronic kidney disease: Secondary | ICD-10-CM | POA: Diagnosis present

## 2022-07-04 DIAGNOSIS — M109 Gout, unspecified: Secondary | ICD-10-CM | POA: Diagnosis present

## 2022-07-04 DIAGNOSIS — I10 Essential (primary) hypertension: Secondary | ICD-10-CM | POA: Diagnosis present

## 2022-07-04 DIAGNOSIS — E669 Obesity, unspecified: Secondary | ICD-10-CM | POA: Diagnosis present

## 2022-07-04 DIAGNOSIS — E785 Hyperlipidemia, unspecified: Secondary | ICD-10-CM

## 2022-07-04 DIAGNOSIS — Z87891 Personal history of nicotine dependence: Secondary | ICD-10-CM

## 2022-07-04 DIAGNOSIS — Z7901 Long term (current) use of anticoagulants: Secondary | ICD-10-CM

## 2022-07-04 DIAGNOSIS — Z7951 Long term (current) use of inhaled steroids: Secondary | ICD-10-CM

## 2022-07-04 DIAGNOSIS — I5020 Unspecified systolic (congestive) heart failure: Secondary | ICD-10-CM | POA: Diagnosis present

## 2022-07-04 DIAGNOSIS — I4891 Unspecified atrial fibrillation: Secondary | ICD-10-CM | POA: Diagnosis present

## 2022-07-04 DIAGNOSIS — I5042 Chronic combined systolic (congestive) and diastolic (congestive) heart failure: Secondary | ICD-10-CM | POA: Diagnosis present

## 2022-07-04 DIAGNOSIS — Z6831 Body mass index (BMI) 31.0-31.9, adult: Secondary | ICD-10-CM

## 2022-07-04 DIAGNOSIS — N400 Enlarged prostate without lower urinary tract symptoms: Secondary | ICD-10-CM

## 2022-07-04 DIAGNOSIS — D649 Anemia, unspecified: Secondary | ICD-10-CM | POA: Diagnosis present

## 2022-07-04 DIAGNOSIS — E46 Unspecified protein-calorie malnutrition: Secondary | ICD-10-CM | POA: Diagnosis present

## 2022-07-04 LAB — BASIC METABOLIC PANEL
Anion gap: 9 (ref 5–15)
BUN: 21 mg/dL (ref 8–23)
CO2: 29 mmol/L (ref 22–32)
Calcium: 8.7 mg/dL — ABNORMAL LOW (ref 8.9–10.3)
Chloride: 103 mmol/L (ref 98–111)
Creatinine, Ser: 1.49 mg/dL — ABNORMAL HIGH (ref 0.61–1.24)
GFR, Estimated: 45 mL/min — ABNORMAL LOW (ref >60)
Glucose, Bld: 88 mg/dL (ref 70–99)
Potassium: 3.2 mmol/L — ABNORMAL LOW (ref 3.5–5.1)
Sodium: 141 mmol/L (ref 135–145)

## 2022-07-04 LAB — CBC
HCT: 42.1 % (ref 39.0–52.0)
Hemoglobin: 13.1 g/dL (ref 13.0–17.0)
MCH: 28.2 pg (ref 26.0–34.0)
MCHC: 31.1 g/dL (ref 30.0–36.0)
MCV: 90.7 fL (ref 80.0–100.0)
Platelets: 292 10*3/uL (ref 150–400)
RBC: 4.64 MIL/uL (ref 4.22–5.81)
RDW: 15.8 % — ABNORMAL HIGH (ref 11.5–15.5)
WBC: 10.8 10*3/uL — ABNORMAL HIGH (ref 4.0–10.5)
nRBC: 0 % (ref 0.0–0.2)

## 2022-07-04 LAB — MAGNESIUM: Magnesium: 1.9 mg/dL (ref 1.7–2.4)

## 2022-07-04 MED ORDER — OXYCODONE HCL 5 MG PO TABS: 5.0000 mg | ORAL_TABLET | ORAL | Status: DC | PRN

## 2022-07-04 MED ORDER — METOPROLOL TARTRATE 25 MG PO TABS
25.0000 mg | ORAL_TABLET | Freq: Once | ORAL | Status: AC
Start: 2022-07-04 — End: 2022-07-04
  Administered 2022-07-04: 25 mg via ORAL
  Filled 2022-07-04: qty 1

## 2022-07-04 MED ORDER — ACETAMINOPHEN 325 MG PO TABS: 650.0000 mg | ORAL_TABLET | Freq: Four times a day (QID) | ORAL | Status: DC | PRN

## 2022-07-04 MED ORDER — METOPROLOL SUCCINATE ER 50 MG PO TB24
100.0000 mg | ORAL_TABLET | Freq: Every morning | ORAL | Status: DC
  Administered 2022-07-05 – 2022-07-08 (×4): 100 mg via ORAL
  Filled 2022-07-04 (×4): qty 2

## 2022-07-04 MED ORDER — ONDANSETRON HCL 4 MG/2ML IJ SOLN: 4.0000 mg | Freq: Four times a day (QID) | INTRAMUSCULAR | Status: DC | PRN

## 2022-07-04 MED ORDER — MORPHINE SULFATE (PF) 2 MG/ML IV SOLN: 2.0000 mg | INTRAVENOUS | Status: DC | PRN

## 2022-07-04 MED ORDER — TAMSULOSIN HCL 0.4 MG PO CAPS
0.4000 mg | ORAL_CAPSULE | Freq: Every day | ORAL | Status: DC
  Administered 2022-07-05 – 2022-07-07 (×3): 0.4 mg via ORAL
  Filled 2022-07-04 (×3): qty 1

## 2022-07-04 MED ORDER — ACETAMINOPHEN 650 MG RE SUPP: 650.0000 mg | Freq: Four times a day (QID) | RECTAL | Status: DC | PRN

## 2022-07-04 MED ORDER — SODIUM CHLORIDE 0.9 % IV BOLUS
1000.0000 mL | Freq: Once | INTRAVENOUS | Status: AC
  Administered 2022-07-04: 1000 mL via INTRAVENOUS

## 2022-07-04 MED ORDER — ATORVASTATIN CALCIUM 40 MG PO TABS
40.0000 mg | ORAL_TABLET | Freq: Every day | ORAL | Status: DC
  Administered 2022-07-05 – 2022-07-08 (×4): 40 mg via ORAL
  Filled 2022-07-04 (×4): qty 1

## 2022-07-04 MED ORDER — MOMETASONE FURO-FORMOTEROL FUM 200-5 MCG/ACT IN AERO
2.0000 | INHALATION_SPRAY | Freq: Two times a day (BID) | RESPIRATORY_TRACT | Status: DC
  Administered 2022-07-05 – 2022-07-08 (×7): 2 via RESPIRATORY_TRACT
  Filled 2022-07-04: qty 8.8

## 2022-07-04 MED ORDER — METOPROLOL TARTRATE 5 MG/5ML IV SOLN
5.0000 mg | Freq: Once | INTRAVENOUS | Status: DC
  Filled 2022-07-04: qty 5

## 2022-07-04 MED ORDER — ONDANSETRON HCL 4 MG PO TABS: 4.0000 mg | ORAL_TABLET | Freq: Four times a day (QID) | ORAL | Status: DC | PRN

## 2022-07-04 MED ORDER — APIXABAN 2.5 MG PO TABS
2.5000 mg | ORAL_TABLET | Freq: Two times a day (BID) | ORAL | Status: DC
  Administered 2022-07-04 – 2022-07-05 (×2): 2.5 mg via ORAL
  Filled 2022-07-04 (×2): qty 1

## 2022-07-04 MED ORDER — SODIUM CHLORIDE 0.9 % IV SOLN: INTRAVENOUS | Status: DC

## 2022-07-04 MED ORDER — POTASSIUM CHLORIDE CRYS ER 20 MEQ PO TBCR
40.0000 meq | EXTENDED_RELEASE_TABLET | Freq: Once | ORAL | Status: AC
  Administered 2022-07-04: 40 meq via ORAL
  Filled 2022-07-04: qty 2

## 2022-07-04 MED ORDER — GABAPENTIN 100 MG PO CAPS
100.0000 mg | ORAL_CAPSULE | Freq: Three times a day (TID) | ORAL | Status: DC
  Administered 2022-07-05 – 2022-07-08 (×11): 100 mg via ORAL
  Filled 2022-07-04 (×11): qty 1
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
## 2022-07-04 NOTE — ED Triage Notes (Addendum)
Pt presents with palpitation x 2 weeks. Pt with hx of a-fib. Pt's cardiologist adjusted his medication a week ago, but pt is still having palpitations. Pt denies ShOB or CP.
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
## 2022-07-04 NOTE — Telephone Encounter (Signed)
I spoke with HHN, and then our call was disconnected.  She had previously told me that I could leave a detailed message, so I left her Dayna's message.

## 2022-07-04 NOTE — Telephone Encounter (Signed)
I spoke with with HHN. Patient has no complaints of SOB or tachycardia  He has ben on increased dose of Toprol (added 25 mg hs to 100 mg qd) 1 week ago   Apical HR today is 132 bpm   BP 112/60    Please advise

## 2022-07-04 NOTE — Telephone Encounter (Signed)
Calling to get patient seen sooner. Still in afib and HR is still irregular.
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
## 2022-07-05 DIAGNOSIS — I951 Orthostatic hypotension: Secondary | ICD-10-CM | POA: Diagnosis present

## 2022-07-05 DIAGNOSIS — Z87442 Personal history of urinary calculi: Secondary | ICD-10-CM | POA: Diagnosis not present

## 2022-07-05 DIAGNOSIS — E876 Hypokalemia: Secondary | ICD-10-CM | POA: Diagnosis present

## 2022-07-05 DIAGNOSIS — E669 Obesity, unspecified: Secondary | ICD-10-CM | POA: Diagnosis present

## 2022-07-05 DIAGNOSIS — I5020 Unspecified systolic (congestive) heart failure: Secondary | ICD-10-CM | POA: Diagnosis present

## 2022-07-05 DIAGNOSIS — I483 Typical atrial flutter: Secondary | ICD-10-CM

## 2022-07-05 DIAGNOSIS — R946 Abnormal results of thyroid function studies: Secondary | ICD-10-CM | POA: Diagnosis present

## 2022-07-05 DIAGNOSIS — Z7901 Long term (current) use of anticoagulants: Secondary | ICD-10-CM

## 2022-07-05 DIAGNOSIS — I480 Paroxysmal atrial fibrillation: Secondary | ICD-10-CM | POA: Diagnosis present

## 2022-07-05 DIAGNOSIS — I10 Essential (primary) hypertension: Secondary | ICD-10-CM

## 2022-07-05 DIAGNOSIS — I5022 Chronic systolic (congestive) heart failure: Secondary | ICD-10-CM | POA: Diagnosis not present

## 2022-07-05 DIAGNOSIS — E46 Unspecified protein-calorie malnutrition: Secondary | ICD-10-CM | POA: Diagnosis present

## 2022-07-05 DIAGNOSIS — N1831 Chronic kidney disease, stage 3a: Secondary | ICD-10-CM | POA: Diagnosis present

## 2022-07-05 DIAGNOSIS — Z79899 Other long term (current) drug therapy: Secondary | ICD-10-CM | POA: Diagnosis not present

## 2022-07-05 DIAGNOSIS — D6859 Other primary thrombophilia: Secondary | ICD-10-CM | POA: Diagnosis present

## 2022-07-05 DIAGNOSIS — N400 Enlarged prostate without lower urinary tract symptoms: Secondary | ICD-10-CM

## 2022-07-05 DIAGNOSIS — I130 Hypertensive heart and chronic kidney disease with heart failure and stage 1 through stage 4 chronic kidney disease, or unspecified chronic kidney disease: Secondary | ICD-10-CM | POA: Diagnosis present

## 2022-07-05 DIAGNOSIS — I4892 Unspecified atrial flutter: Secondary | ICD-10-CM | POA: Diagnosis present

## 2022-07-05 DIAGNOSIS — J449 Chronic obstructive pulmonary disease, unspecified: Secondary | ICD-10-CM | POA: Diagnosis not present

## 2022-07-05 DIAGNOSIS — I4891 Unspecified atrial fibrillation: Secondary | ICD-10-CM | POA: Diagnosis not present

## 2022-07-05 DIAGNOSIS — E785 Hyperlipidemia, unspecified: Secondary | ICD-10-CM

## 2022-07-05 DIAGNOSIS — E1122 Type 2 diabetes mellitus with diabetic chronic kidney disease: Secondary | ICD-10-CM | POA: Diagnosis present

## 2022-07-05 DIAGNOSIS — N179 Acute kidney failure, unspecified: Secondary | ICD-10-CM | POA: Diagnosis present

## 2022-07-05 DIAGNOSIS — J441 Chronic obstructive pulmonary disease with (acute) exacerbation: Secondary | ICD-10-CM | POA: Diagnosis present

## 2022-07-05 DIAGNOSIS — I5032 Chronic diastolic (congestive) heart failure: Secondary | ICD-10-CM | POA: Diagnosis not present

## 2022-07-05 DIAGNOSIS — M109 Gout, unspecified: Secondary | ICD-10-CM | POA: Diagnosis present

## 2022-07-05 DIAGNOSIS — D649 Anemia, unspecified: Secondary | ICD-10-CM | POA: Diagnosis present

## 2022-07-05 DIAGNOSIS — I5042 Chronic combined systolic (congestive) and diastolic (congestive) heart failure: Secondary | ICD-10-CM | POA: Diagnosis present

## 2022-07-05 DIAGNOSIS — R8290 Unspecified abnormal findings in urine: Secondary | ICD-10-CM | POA: Diagnosis present

## 2022-07-05 DIAGNOSIS — I700 Atherosclerosis of aorta: Secondary | ICD-10-CM | POA: Diagnosis present

## 2022-07-05 DIAGNOSIS — H9190 Unspecified hearing loss, unspecified ear: Secondary | ICD-10-CM | POA: Diagnosis present

## 2022-07-05 DIAGNOSIS — E7800 Pure hypercholesterolemia, unspecified: Secondary | ICD-10-CM | POA: Diagnosis present

## 2022-07-05 DIAGNOSIS — Z6831 Body mass index (BMI) 31.0-31.9, adult: Secondary | ICD-10-CM | POA: Diagnosis not present

## 2022-07-05 DIAGNOSIS — Z7951 Long term (current) use of inhaled steroids: Secondary | ICD-10-CM | POA: Diagnosis not present

## 2022-07-05 LAB — CBC
HCT: 36.7 % — ABNORMAL LOW (ref 39.0–52.0)
Hemoglobin: 11.1 g/dL — ABNORMAL LOW (ref 13.0–17.0)
MCH: 28 pg (ref 26.0–34.0)
MCHC: 30.2 g/dL (ref 30.0–36.0)
MCV: 92.4 fL (ref 80.0–100.0)
Platelets: 269 10*3/uL (ref 150–400)
RBC: 3.97 MIL/uL — ABNORMAL LOW (ref 4.22–5.81)
RDW: 15.5 % (ref 11.5–15.5)
WBC: 10.5 10*3/uL (ref 4.0–10.5)
nRBC: 0 % (ref 0.0–0.2)

## 2022-07-05 LAB — URINALYSIS, ROUTINE W REFLEX MICROSCOPIC
Bacteria, UA: NONE SEEN
Bilirubin Urine: NEGATIVE
Glucose, UA: NEGATIVE mg/dL
Hgb urine dipstick: NEGATIVE
Ketones, ur: NEGATIVE mg/dL
Nitrite: NEGATIVE
Protein, ur: NEGATIVE mg/dL
Specific Gravity, Urine: 1.013 (ref 1.005–1.030)
pH: 5 (ref 5.0–8.0)

## 2022-07-05 LAB — COMPREHENSIVE METABOLIC PANEL
ALT: 13 U/L (ref 0–44)
AST: 15 U/L (ref 15–41)
Albumin: 2.6 g/dL — ABNORMAL LOW (ref 3.5–5.0)
Alkaline Phosphatase: 102 U/L (ref 38–126)
Anion gap: 6 (ref 5–15)
BUN: 19 mg/dL (ref 8–23)
CO2: 28 mmol/L (ref 22–32)
Calcium: 7.8 mg/dL — ABNORMAL LOW (ref 8.9–10.3)
Chloride: 107 mmol/L (ref 98–111)
Creatinine, Ser: 1.34 mg/dL — ABNORMAL HIGH (ref 0.61–1.24)
GFR, Estimated: 52 mL/min — ABNORMAL LOW (ref >60)
Glucose, Bld: 98 mg/dL (ref 70–99)
Potassium: 3.8 mmol/L (ref 3.5–5.1)
Sodium: 141 mmol/L (ref 135–145)
Total Bilirubin: 0.5 mg/dL (ref 0.3–1.2)
Total Protein: 5.7 g/dL — ABNORMAL LOW (ref 6.5–8.1)

## 2022-07-05 LAB — MAGNESIUM: Magnesium: 1.8 mg/dL (ref 1.7–2.4)

## 2022-07-05 LAB — TSH: TSH: 4.742 u[IU]/mL — ABNORMAL HIGH (ref 0.350–4.500)

## 2022-07-05 LAB — MRSA NEXT GEN BY PCR, NASAL: MRSA by PCR Next Gen: NOT DETECTED

## 2022-07-05 LAB — T4, FREE: Free T4: 1.02 ng/dL (ref 0.61–1.12)

## 2022-07-05 MED ORDER — AMIODARONE HCL IN DEXTROSE 360-4.14 MG/200ML-% IV SOLN
30.0000 mg/h | INTRAVENOUS | Status: DC
  Administered 2022-07-05 – 2022-07-08 (×6): 30 mg/h via INTRAVENOUS
  Filled 2022-07-05 (×5): qty 200

## 2022-07-05 MED ORDER — CHLORHEXIDINE GLUCONATE CLOTH 2 % EX PADS
6.0000 | MEDICATED_PAD | Freq: Every day | CUTANEOUS | Status: DC
  Administered 2022-07-05 – 2022-07-08 (×4): 6 via TOPICAL

## 2022-07-05 MED ORDER — SODIUM CHLORIDE 0.9% FLUSH: 3.0000 mL | INTRAVENOUS | Status: DC | PRN

## 2022-07-05 MED ORDER — AMIODARONE HCL IN DEXTROSE 360-4.14 MG/200ML-% IV SOLN
60.0000 mg/h | INTRAVENOUS | Status: DC
  Administered 2022-07-05: 60 mg/h via INTRAVENOUS
  Filled 2022-07-05: qty 200

## 2022-07-05 MED ORDER — SODIUM CHLORIDE 0.9% FLUSH
3.0000 mL | Freq: Two times a day (BID) | INTRAVENOUS | Status: DC
  Administered 2022-07-05 – 2022-07-08 (×7): 3 mL via INTRAVENOUS

## 2022-07-05 MED ORDER — LEVALBUTEROL HCL 0.63 MG/3ML IN NEBU
0.6300 mg | INHALATION_SOLUTION | Freq: Four times a day (QID) | RESPIRATORY_TRACT | Status: DC | PRN
Start: 2022-07-05 — End: 2022-07-08

## 2022-07-05 MED ORDER — APIXABAN 5 MG PO TABS
5.0000 mg | ORAL_TABLET | Freq: Two times a day (BID) | ORAL | Status: DC
  Administered 2022-07-05 – 2022-07-08 (×6): 5 mg via ORAL
  Filled 2022-07-05 (×6): qty 1

## 2022-07-05 MED ORDER — SODIUM CHLORIDE 0.9 % IV SOLN: 250.0000 mL | INTRAVENOUS | Status: DC | PRN

## 2022-07-05 MED ORDER — AMIODARONE IV BOLUS ONLY 150 MG/100ML
150.0000 mg | Freq: Once | INTRAVENOUS | Status: AC
  Administered 2022-07-05: 150 mg via INTRAVENOUS
  Filled 2022-07-05: qty 100

## 2022-07-05 MED ORDER — AMIODARONE LOAD VIA INFUSION
150.0000 mg | Freq: Once | INTRAVENOUS | Status: DC
  Filled 2022-07-05: qty 83.34

## 2022-07-05 MED ORDER — ORAL CARE MOUTH RINSE: 15.0000 mL | OROMUCOSAL | Status: DC | PRN

## 2022-07-05 NOTE — Progress Notes (Signed)
   07/04/22 2330  Assess: MEWS Score  Temp 97.9 F (36.6 C)  BP 122/88  MAP (mmHg) 99  Pulse Rate (!) 131  Resp 17  SpO2 99 %  O2 Device Room Air  Assess: MEWS Score  MEWS Temp 0  MEWS Systolic 0  MEWS Pulse 3  MEWS RR 0  MEWS LOC 0  MEWS Score 3  MEWS Score Color Yellow  Assess: if the MEWS score is Yellow or Red  Were vital signs taken at a resting state? Yes  Focused Assessment No change from prior assessment  Does the patient meet 2 or more of the SIRS criteria? No  Does the patient have a confirmed or suspected source of infection? No  MEWS guidelines implemented *See Row Information* Yes  Treat  MEWS Interventions Administered scheduled meds/treatments  Take Vital Signs  Increase Vital Sign Frequency  Yellow: Q 2hr X 2 then Q 4hr X 2, if remains yellow, continue Q 4hrs  Escalate  MEWS: Escalate Yellow: discuss with charge nurse/RN and consider discussing with provider and RRT  Notify: Charge Nurse/RN  Name of Charge Nurse/RN Notified kristi, rn  Date Charge Nurse/RN Notified 07/04/22  Time Charge Nurse/RN Notified 2330  Notify: Provider  Provider Name/Title Camillo Flaming  Date Provider Notified 07/04/22  Time Provider Notified 2330  Method of Notification  (amion)  Notification Reason Other (Comment) (continued elevated HR)  Provider response No new orders  Date of Provider Response 07/04/22  Time of Provider Response 2333  Assess: SIRS CRITERIA  SIRS Temperature  0  SIRS Pulse 1  SIRS Respirations  0  SIRS WBC 0  SIRS Score Sum  1

## 2022-07-05 NOTE — Progress Notes (Signed)
Pt's SPO2 dropped to 79 % on RA. Pt sleeping comfortably. Upon waking patient SPO2 increased to 97%.  Denies HX of sleep apnea. 2 L [place] was offered however. Patient declines. Timothy Mata

## 2022-07-05 NOTE — Assessment & Plan Note (Signed)
-   Continue Eliquis and metoprolol -Currently in a flutter, asymptomatic -ER was unable to cardiovert patient as he was hypokalemic and also had had 10 ounces of water at some point during his ER visit -Consult cardiology for possible cardioversion -Continue to monitor

## 2022-07-05 NOTE — Assessment & Plan Note (Signed)
-   Continue statin, beta-blocker -Currently holding Lasix as patient is n.p.o. -Continue to monitor

## 2022-07-05 NOTE — Assessment & Plan Note (Signed)
-   Continue Dulera -Given his tachycardia adding as needed Xopenex for dyspnea and wheezing -Continue to monitor
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
## 2022-07-05 NOTE — Assessment & Plan Note (Signed)
-   Continue Flomax 

## 2022-07-05 NOTE — Progress Notes (Signed)
Patient transported to ICU-04, for further and closer evaluation. Patient has recurrent persistent atrial flutter,hr as high as 130's,patient is asymptomatic,no c/o dizziness,or shortness or breath noted. Alert and oriented x's 4. Report called and given to Macky Lower RN. Transported by staff to awaiting floor.Marland Kitchen

## 2022-07-05 NOTE — Assessment & Plan Note (Signed)
Continue statin. 

## 2022-07-05 NOTE — TOC Initial Note (Signed)
Transition of Care [facility]) - Initial/Assessment Note    Patient Details  Name: Timothy Mata MRN: [id number] Date of Birth: 08/18/1933  [facility]) CM/SW Contact:    Villa Herb, LCSWA Phone Number: 07/05/2022, 9:20 AM  Clinical Narrative:                 CSW found during chart review that pt is active with a [facility] RN. CSW spoke with pt and wife who states that the [facility] agency is [facility] in [place]. CSW confirmed that pt has HH RN with [employer] office. CSW spoke with this office, they are able to offer PT/OT along with the RN services if needed. They will need new HH order at D/C. Clinicals and orders will need to be faxed to [phone] prior to pts D/C. TOC to follow.   Expected Discharge Plan: Home w Home Health Services Barriers to Discharge: Continued Medical Work up   Patient Goals and CMS Choice Patient states their goals for this hospitalization and ongoing recovery are:: return home CMS Medicare.gov Compare Post Acute Care list provided to:: Patient Choice offered to / list presented to : Patient  Expected Discharge Plan and Services Expected Discharge Plan: Home w Home Health Services In-house Referral: Clinical Social Work Discharge Planning Services: CM Consult Post Acute Care Choice: Home Health Living arrangements for the past 2 months: Single Family Home                                      Prior Living Arrangements/Services Living arrangements for the past 2 months: Single Family Home Lives with:: Spouse Patient language and need for interpreter reviewed:: Yes Do you feel safe going back to the place where you live?: Yes      Need for Family Participation in Patient Care: Yes (Comment) Care giver support system in place?: Yes (comment) Current home services: Home RN Criminal Activity/Legal Involvement Pertinent to Current Situation/Hospitalization: No - Comment as needed  Activities of Daily Living Home Assistive  Devices/Equipment: Cane (specify quad or straight) ADL Screening (condition at time of admission) Patient's cognitive ability adequate to safely complete daily activities?: Yes Is the patient deaf or have difficulty hearing?: No Does the patient have difficulty seeing, even when wearing glasses/contacts?: No Does the patient have difficulty concentrating, remembering, or making decisions?: No Patient able to express need for assistance with ADLs?: Yes Does the patient have difficulty dressing or bathing?: No Independently performs ADLs?: Yes (appropriate for developmental age) Does the patient have difficulty walking or climbing stairs?: No Weakness of Legs: None Weakness of Arms/Hands: None  Permission Sought/Granted                  Emotional Assessment Appearance:: Appears stated age Attitude/Demeanor/Rapport: Engaged Affect (typically observed): Accepting Orientation: : Oriented to Self, Oriented to Place, Oriented to  Time, Oriented to Situation Alcohol / Substance Use: Not Applicable Psych Involvement: No (comment)  Admission diagnosis:  Atrial flutter (HCC) [I48.92] Orthostatic hypotension [I95.1] Hypokalemia [E87.6] Typical atrial flutter (HCC) [I48.3] Patient Active Problem List   Diagnosis Date Noted   BPH (benign prostatic hyperplasia) 07/05/2022   COPD with acute exacerbation (HCC) 07/05/2022   Hyperlipidemia 07/05/2022   Atrial flutter (HCC) 07/04/2022   Essential hypertension 03/26/2022   Chronic diastolic CHF (congestive heart failure) (HCC) 03/26/2022   Lactic acidosis 03/26/2022   Leukocytosis 03/26/2022   Typical atrial flutter (HCC) 03/26/2022  NSTEMI (non-ST elevated myocardial infarction) (HCC) 03/24/2022   Preretinal fibrosis, left eye 07/18/2015   PCP:  Royann Shivers, PA-C Pharmacy:   [facility] [phone] - [place], [place] - [address] [place] [address] Phone: [phone] Fax:  [phone]     Social Determinants of Health (SDOH) Interventions    Readmission Risk Interventions     No data to display

## 2022-07-05 NOTE — Assessment & Plan Note (Signed)
Continue metoprolol. 

## 2022-07-05 NOTE — Consult Note (Addendum)
Cardiology Consultation:   Patient ID: Timothy Mata MRN: [id number]; DOB: Aug 21, 1933  Admit date: 07/04/2022 Date of Consult: 07/05/2022  PCP:  Rosalee Kaufman, [place] Prov[facility] Cardiologist:  Dorris Carnes, MD        Patient Profile:   Timothy Mata is a 88 y.o. male with a hx of persistent atrial flutter (dx 02/2022), paroxysmal atrial fibrillation, HTN, HLD, CKD 3(a-b), BPH, COPD, obesity, traumatic hemothorax, chronic HFmrEF (EF 45% 05/2022 at [facility]), aortic atherosclerosis on echo 05/2022 who is being seen 07/05/2022 for the evaluation of atrial flutter at the request of Dr. Clearence Ped.  History of Present Illness:   Timothy Mata was diagnosed with atrial flutter RVR in 02/2022 with elevated troponin felt due to demand ischemia. Echo at that time showed preserved LVEF and he was treated with diltiazem + atenolol + Eliquis and cardioverted 04/26/22. At last clinic visit 05/08/22, he was in rate controlled atrial fib so rate control strategy was pursued. He had a complex admission 5/23-6/11/23 at [facility] for a lawnmower accident where he sustained large R hemothorax requiring chest tube and VATS with decortication and rib fractures. During admission he had CHF exacerbation with echo showing EF 45%, mildly dilated RV, moderate MR, trivial pericardial effusion and required IV diuresis. He was in recurrent atrial flutter so underwent TEE/DCCV on 06/07/22 with successful conversion to NSR. (His Eliquis had been able to be resumed by that point.) The TEE measured the MR as trivial. Diltiazem was discontinued due to LV dysfunction and atenolol transitioned to metoprolol with plan for EP follow-up with [facility] for consideration of ablation. However, in the meantime, he has been followed by home health who has been communicating with our office about recurrent tachycardia. Per phone note last week, metoprolol was adjusted to 100mg  QAM/25mg  QPM but tachycardia persisted, raising concern for  recurrent atrial flutter, therefore advised to present to the ER yesterday. EKG confirms recurrent atrial flutter predominantly in a plan of 2:1 conduction. Fortunately the patient is asymptomatic and denies any palpitations, CP, SOB, dizziness, or edema. No bleeding. Reports adherence to Eliquis without any missed doses. BP has been intermittently low in the upper 80s/50s, with peak BP 123/95. Labs demonstrated hypokalemia of 3.2 improved to 3.8, Cr 1.49->1.34, albumin 2.6, Hgb 13.1->11.1 (following IV fluids), TSH 4.742.  Wife at bedside - they're working on their 54th year of marriage.   Past Medical History:  Diagnosis Date   Atrial flutter ([place])    a. s/p DCCV in 03/2022 b. repeat EKG at follow-up from DCCV showing atrial fibrillation   BPH (benign prostatic hyperplasia)    COPD (chronic obstructive pulmonary disease) ([place])    Dysrhythmia    Gout    "left big toe"   History of kidney stones    HOH (hard of hearing)    Hypercholesterolemia    Hypertension    Preretinal fibrosis, left eye    Spinal headache 12/30/1972    Past Surgical History:  Procedure Laterality Date   25 GAUGE PARS PLANA VITRECTOMY WITH 20 GAUGE MVR PORT Left 07/18/2015   Procedure: 25 GAUGE PARS PLANA VITRECTOMY WITH 20 GAUGE MVR PORT;  Surgeon: Hayden Pedro, MD;  Location: [place];  Service: Ophthalmology;  Laterality: Left;   AIR/FLUID EXCHANGE Left 07/18/2015   Procedure: AIR/FLUID EXCHANGE;  Surgeon: Hayden Pedro, MD;  Location: [place];  Service: Ophthalmology;  Laterality: Left;   BACK SURGERY     CARDIOVERSION N/A 04/26/2022   Procedure: CARDIOVERSION;  Surgeon: Domenic Polite,  Aloha Gell, MD;  Location: [facility];  Service: Cardiovascular;  Laterality: N/A;   EYE SURGERY     FOOT FRACTURE SURGERY Right 1981   LASER PHOTO ABLATION Left 07/18/2015   Procedure: LASER PHOTO ABLATION;  Surgeon: Hayden Pedro, MD;  Location: [place];  Service: Ophthalmology;  Laterality: Left;  head scope    LUMBAR DISC SURGERY  1974    MEMBRANE PEEL Left 07/18/2015   Procedure: MEMBRANE PEEL;  Surgeon: Hayden Pedro, MD;  Location: [place];  Service: Ophthalmology;  Laterality: Left;   MULTIPLE TOOTH EXTRACTIONS  1990's   PARS PLANA VITRECTOMY Left 07/18/2015     Home Medications:  Prior to Admission medications   Medication Sig Start Date End Date Taking? Authorizing Provider  allopurinol (ZYLOPRIM) 300 MG tablet Take 300 mg by mouth daily after supper.  06/12/15  Yes [provider]  apixaban (ELIQUIS) 2.5 MG TABS tablet Take 1 tablet (2.5 mg total) by mouth 2 (two) times daily. 05/08/22 05/03/23 Yes Strader, Fransisco Hertz, PA-C  atorvastatin (LIPITOR) 40 MG tablet Take 1 tablet (40 mg total) by mouth daily. 05/08/22 05/03/23 Yes Strader, Fransisco Hertz, PA-C  Fluticasone-Salmeterol (ADVAIR) 250-50 MCG/DOSE AEPB Inhale 1 puff into the lungs 2 (two) times daily.   Yes [provider]  furosemide (LASIX) 40 MG tablet Take 40 mg by mouth 2 (two) times daily. 06/10/22  Yes [provider]  gabapentin (NEURONTIN) 100 MG capsule Take 100 mg by mouth 3 (three) times daily. 06/09/22  Yes [provider]  hydrocortisone cream 1 % Apply 1 application. topically 2 (two) times daily as needed for itching.   Yes [provider]  metoprolol succinate (TOPROL XL) 25 MG 24 hr tablet Take 1 tablet (25 mg total) by mouth at bedtime. 06/27/22  Yes Strader, [place] M, PA-C  metoprolol succinate (TOPROL-XL) 100 MG 24 hr tablet Take 1 tablet (100 mg total) by mouth in the morning. Take with or immediately following a meal. 06/27/22 06/22/23 Yes Strader, [place] M, PA-C  nitroGLYCERIN (NITROSTAT) 0.4 MG SL tablet Place 1 tablet (0.4 mg total) under the tongue every 5 (five) minutes x 3 doses as needed for chest pain. 03/27/22  Yes [place] Donnamarie Poag, DO  tamsulosin (FLOMAX) 0.4 MG CAPS capsule Take 1 capsule (0.4 mg total) by mouth daily after supper. 03/27/22  Yes [place] Donnamarie Poag, DO    Inpatient Medications: Scheduled  Meds:  amiodarone  150 mg Intravenous Once   apixaban  2.5 mg Oral BID   atorvastatin  40 mg Oral Daily   gabapentin  100 mg Oral TID   metoprolol succinate  100 mg Oral q AM   metoprolol tartrate  5 mg Intravenous Once   mometasone-formoterol  2 puff Inhalation BID   sodium chloride flush  3 mL Intravenous Q12H   tamsulosin  0.4 mg Oral QPC supper   Continuous Infusions:  sodium chloride Stopped (07/05/22 0934)   amiodarone     Followed by   amiodarone     PRN Meds: sodium chloride, acetaminophen **OR** acetaminophen, levalbuterol, morphine injection, ondansetron **OR** ondansetron (ZOFRAN) IV, oxyCODONE, sodium chloride flush  Allergies:   No Known Allergies  Social History:   Social History   Socioeconomic History   Marital status: Married    Spouse name: Not on file   Number of children: Not on file   Years of education: Not on file   Highest education level: Not on file  Occupational History   Not on file  Tobacco Use   Smoking status: Former    Packs/day: 1.00    Years: 32.00    Total pack years: 32.00    Types: Cigarettes   Smokeless tobacco: Never   Tobacco comments:    quit smoking at age 81  Vaping Use   Vaping Use: Never used  Substance and Sexual Activity   Alcohol use: No   Drug use: No   Sexual activity: Not on file  Other Topics Concern   Not on file  Social History Narrative   Not on file   Social Determinants of Health   Financial Resource Strain: Not on file  Food Insecurity: Not on file  Transportation Needs: Not on file  Physical Activity: Not on file  Stress: Not on file  Social Connections: Not on file  Intimate Partner Violence: Not on file    Family History:   Family History  Problem Relation Age of Onset   Cancer Father      ROS:  Please see the history of present illness.  All other ROS reviewed and negative.     Physical Exam/Data:   Vitals:   07/05/22 0736 07/05/22 0827 07/05/22 0835 07/05/22 0847  BP: (!) 89/55  117/75    Pulse: (!) 125 (!) 128  (!) 112  Resp: 17     Temp: 98 F (36.7 C)     TempSrc:      SpO2: 97% 95% 95%   Weight:      Height:        Intake/Output Summary (Last 24 hours) at 07/05/2022 0949 Last data filed at 07/05/2022 0524 Gross per 24 hour  Intake 1696.92 ml  Output --  Net 1696.92 ml      07/04/2022   11:30 PM 07/04/2022    4:16 PM 05/08/2022    3:18 PM  Last 3 Weights  Weight (lbs) 230 lb 9.6 oz 220 lb 245 lb 9.6 oz  Weight (kg) 104.599 kg 99.791 kg 111.403 kg     Body mass index is 33.09 kg/m.  General: Well developed, well nourished, in no acute distress. Head: Normocephalic, atraumatic, sclera non-icteric, no xanthomas, nares are without discharge. Neck: Negative for carotid bruits. JVP not elevated. Lungs: Clear bilaterally to auscultation without wheezes, rales, or rhonchi. Breathing is unlabored. Heart: Tachycardic, regular S1 S2 without murmurs, rubs, or gallops.  Abdomen: Soft, non-tender, non-distended with normoactive bowel sounds. No rebound/guarding. Extremities: No clubbing or cyanosis. No edema. Distal pedal pulses are 2+ and equal bilaterally. Neuro: Alert and oriented X 3. Moves all extremities spontaneously. Psych:  Responds to questions appropriately with a normal affect.   EKG:  The EKG was personally reviewed and demonstrates:  atrial flutter 2:1 conduction  Telemetry:  Telemetry was personally reviewed and demonstrates:  atrial flutter 136bpm, nonspecific STTW changes  Relevant CV Studies:  TEE @ [facility] 06/07/22  -  SUMMARY  The left ventricular size is normal. Left ventricular systolic  function is mildly reduced (function may be underestimated in setting  of tachyarrhythmia 120-140s bpm).  The right ventricular size and systolic function is normal.  The left atrium is mildly dilated.  No thrombus is detected in the left atrial appendage.  There is no significant valvular stenosis or regurgitation.  There is no Doppler evidence for a  patent foramen ovale.   -  FINDINGS:  LEFT VENTRICLE  The left ventricular size is normal. Left ventricular systolic  function is mildly reduced.   -  RIGHT VENTRICLE  The right ventricle  is normal size. The right ventricular systolic  function is normal.   LEFT ATRIUM  The left atrium is mildly dilated. No thrombus is detected in the left  atrial appendage.   RIGHT ATRIUM  Right atrial size is normal. There is no Doppler evidence for a patent  foramen ovale.  -  AORTIC VALVE  The aortic valve is trileaflet. The aortic valve is normal in  structure and function. There is no aortic regurgitation.  -  MITRAL VALVE  The mitral valve is normal in structure and function. There is trace  mitral regurgitation.  -  TRICUSPID VALVE  The tricuspid valve is normal in structure and function. No tricuspid  regurgitation.  -  PULMONIC VALVE  The pulmonic valve is normal in structure and function. There is no  pulmonic valvular regurgitation.  -  ARTERIES  Moderate atherosclerotic plaque(s) in the descending aorta.  -  VENOUS  Pulmonary venous flow pattern is blunted.  -  EFFUSION  There is no pericardial effusion.  -  3D FINDINGS  3D not performed.  -  -  MISC  There is no significant valvular stenosis or regurgitation.   2D echo 06/03/22  SUMMARY  The left ventricular size is normal with mild concentric hypertrophy.  Left ventricular systolic function is mildly reduced; LVEF = 45%.  The right ventricle is mildly dilated. The right ventricular systolic  function is mildly reduced.  Mitral annular calcification and thickened mitral leaflets with  moderate mitral regurgitation.  There is trivial pericardial effusion.  There is no comparison study available.   Laboratory Data:  High Sensitivity Troponin:  No results for input(s): "TROPONINIHS" in the last 720 hours.   Chemistry Recent Labs  Lab 07/04/22 1705 07/05/22 0436  NA 141 141  K 3.2* 3.8  CL 103 107   CO2 29 28  GLUCOSE 88 98  BUN 21 19  CREATININE 1.49* 1.34*  CALCIUM 8.7* 7.8*  MG 1.9 1.8  GFRNONAA 45* 52*  ANIONGAP 9 6    Recent Labs  Lab 07/05/22 0436  PROT 5.7*  ALBUMIN 2.6*  AST 15  ALT 13  ALKPHOS 102  BILITOT 0.5   Lipids No results for input(s): "CHOL", "TRIG", "HDL", "LABVLDL", "LDLCALC", "CHOLHDL" in the last 168 hours.  Hematology Recent Labs  Lab 07/04/22 1705 07/05/22 0436  WBC 10.8* 10.5  RBC 4.64 3.97*  HGB 13.1 11.1*  HCT 42.1 36.7*  MCV 90.7 92.4  MCH 28.2 28.0  MCHC 31.1 30.2  RDW 15.8* 15.5  PLT 292 269   Thyroid  Recent Labs  Lab 07/05/22 0436  TSH 4.742*    BNPNo results for input(s): "BNP", "PROBNP" in the last 168 hours.  DDimer No results for input(s): "DDIMER" in the last 168 hours.   Radiology/Studies:  DG Chest 2 View  Result Date: 07/04/2022 CLINICAL DATA:  Palpitations EXAM: CHEST - 2 VIEW COMPARISON:  05/21/2022 FINDINGS: Right basilar scarring/atelectasis. Probable right pleural thickening. Normal heart size. Plate and screw fixation along lateral right ribs. IMPRESSION: Right basilar scarring/atelectasis. Electronically Signed   By: Macy Mis M.D.   On: 07/04/2022 16:55     Assessment and Plan:   1. Recurrent persistent atrial flutter, also with history of atrial fibrillation - this is 3rd event of atrial flutter RVR following 2 prior cardioversions therefore I do not think it would be clinically beneficial to jump straight to a third repeat DCCV without consideration of antiarrhythmic therapy - suspect he would benefit from amiodarone - further  complicating the issue is his Eliquis dosing - has been on renal dosed Eliquis due to age and Cr >1.5 though this has improved to the 1.3-1.4 range so may need to be considered as subtherapeutic - will review further with MD  2. Cardiomyopathy with chronic HFmrEF - suspect tachy-induced or stress induced last admission from lawn mower accident and recurrent atrial  arrhythmias - volume status looks OK, not requiring any standing diuretic - would saline lock fluids given decreased EF, tachycardia, and resuming diet - BP too soft to advance GDMT  3. Aortic atherosclerosis, HLD - continue home statin - LDL 76 in 02/2022 at which time simvastatin was switched to atorvastatin, lipids otherwise followed by PCP  4. CKD stage 3a-b - challenging dosing of Eliquis because of variance of renal function - largely stable/improved from prior  5. Abnormal TSH, mild anemia - check free T4 - mgmt of Hgb per IM  Risk Assessment/Risk Scores:        New York Heart Association (NYHA) Functional Class NYHA Class II  CHA2DS2-VASc Score = 5   This indicates a 7.2% annual risk of stroke. The patient's score is based upon: CHF History: 1 HTN History: 1 Diabetes History: 0 Stroke History: 0 Vascular Disease History: 1 (aortic atherosclerosis) Age Score: 2 Gender Score: 0      For questions or updates, please contact [facility] Please consult [email or web address] for contact info under    Signed, Donato Schultz, MD  07/05/2022 9:49 AM  Personally seen and examined. Agree with above.  88 year old male with atrial flutter rapid ventricular response, previously diagnosed in March 2023 with this condition, prior cardioversion 4/28 with subsequent return of rate controlled atrial fibrillation, prior to lawnmower accident in late May, right hemothorax, EF 45% here with recurrent persistent atrial flutter.  General alert and oriented x3.  Heart rate tachycardic regular.  Labs reviewed, TSH mildly elevated.  Atrial flutter - We will go ahead and utilize IV amiodarone.  He has had unsuccessful cardioversions in the past.  Amiodarone will be useful to help him sustain normal rhythm and perhaps will chemically cardiovert him as well. -We will check a free T4.  Liver enzymes have been checked with use of high risk medication amiodarone.  Continue to monitor as  outpatient.  Chronic systolic heart failure - May be related to underlying arrhythmia. -Blood pressure too soft for ARN I.   Chronic anticoagulation - Currently agree with Eliquis 2.5 mg twice daily dosing given his fluctuations in creatinine greater than 1.5 at times.  He has also had right hemothorax recently.  Protein calorie malnutrition - Albumin 2.6.  Continue with supportive care, protein administration.  Donato Schultz, MD

## 2022-07-05 NOTE — Care Management Obs Status (Signed)
MEDICARE OBSERVATION STATUS NOTIFICATION   Patient Details  Name: Timothy Mata MRN: [id number] Date of Birth: 1933-05-26   Medicare Observation Status Notification Given:  Yes    Villa Herb, LCSWA 07/05/2022, 10:45 AM
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
## 2022-07-06 DIAGNOSIS — J449 Chronic obstructive pulmonary disease, unspecified: Secondary | ICD-10-CM | POA: Diagnosis not present

## 2022-07-06 DIAGNOSIS — I5020 Unspecified systolic (congestive) heart failure: Secondary | ICD-10-CM

## 2022-07-06 DIAGNOSIS — E876 Hypokalemia: Secondary | ICD-10-CM

## 2022-07-06 DIAGNOSIS — I10 Essential (primary) hypertension: Secondary | ICD-10-CM | POA: Diagnosis not present

## 2022-07-06 DIAGNOSIS — I4891 Unspecified atrial fibrillation: Secondary | ICD-10-CM

## 2022-07-06 DIAGNOSIS — N400 Enlarged prostate without lower urinary tract symptoms: Secondary | ICD-10-CM | POA: Diagnosis not present

## 2022-07-06 DIAGNOSIS — N1831 Chronic kidney disease, stage 3a: Secondary | ICD-10-CM

## 2022-07-06 LAB — CBC
HCT: 35.8 % — ABNORMAL LOW (ref 39.0–52.0)
Hemoglobin: 11.2 g/dL — ABNORMAL LOW (ref 13.0–17.0)
MCH: 28.4 pg (ref 26.0–34.0)
MCHC: 31.3 g/dL (ref 30.0–36.0)
MCV: 90.9 fL (ref 80.0–100.0)
Platelets: 258 10*3/uL (ref 150–400)
RBC: 3.94 MIL/uL — ABNORMAL LOW (ref 4.22–5.81)
RDW: 15.8 % — ABNORMAL HIGH (ref 11.5–15.5)
WBC: 9.9 10*3/uL (ref 4.0–10.5)
nRBC: 0 % (ref 0.0–0.2)

## 2022-07-06 LAB — BASIC METABOLIC PANEL
Anion gap: 7 (ref 5–15)
BUN: 17 mg/dL (ref 8–23)
CO2: 26 mmol/L (ref 22–32)
Calcium: 8.1 mg/dL — ABNORMAL LOW (ref 8.9–10.3)
Chloride: 108 mmol/L (ref 98–111)
Creatinine, Ser: 1.18 mg/dL (ref 0.61–1.24)
GFR, Estimated: >60 mL/min (ref >60)
Glucose, Bld: 98 mg/dL (ref 70–99)
Potassium: 3.4 mmol/L — ABNORMAL LOW (ref 3.5–5.1)
Sodium: 141 mmol/L (ref 135–145)

## 2022-07-06 LAB — MAGNESIUM: Magnesium: 1.8 mg/dL (ref 1.7–2.4)

## 2022-07-06 LAB — URINE CULTURE: Culture: <10000 — AB

## 2022-07-06 MED ORDER — MAGNESIUM SULFATE 4 GM/100ML IV SOLN
4.0000 g | Freq: Once | INTRAVENOUS | Status: AC
  Administered 2022-07-06: 4 g via INTRAVENOUS
  Filled 2022-07-06: qty 100

## 2022-07-06 MED ORDER — MORPHINE SULFATE (PF) 2 MG/ML IV SOLN: 1.0000 mg | INTRAVENOUS | Status: DC | PRN

## 2022-07-06 MED ORDER — POTASSIUM CHLORIDE CRYS ER 20 MEQ PO TBCR
40.0000 meq | EXTENDED_RELEASE_TABLET | Freq: Once | ORAL | Status: AC
  Administered 2022-07-06: 40 meq via ORAL
  Filled 2022-07-06: qty 2
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
## 2022-07-07 DIAGNOSIS — J449 Chronic obstructive pulmonary disease, unspecified: Secondary | ICD-10-CM | POA: Diagnosis not present

## 2022-07-07 DIAGNOSIS — N400 Enlarged prostate without lower urinary tract symptoms: Secondary | ICD-10-CM | POA: Diagnosis not present

## 2022-07-07 DIAGNOSIS — I4891 Unspecified atrial fibrillation: Secondary | ICD-10-CM | POA: Diagnosis not present

## 2022-07-07 DIAGNOSIS — I10 Essential (primary) hypertension: Secondary | ICD-10-CM | POA: Diagnosis not present

## 2022-07-07 LAB — BASIC METABOLIC PANEL
Anion gap: 5 (ref 5–15)
BUN: 16 mg/dL (ref 8–23)
CO2: 27 mmol/L (ref 22–32)
Calcium: 8 mg/dL — ABNORMAL LOW (ref 8.9–10.3)
Chloride: 108 mmol/L (ref 98–111)
Creatinine, Ser: 1.3 mg/dL — ABNORMAL HIGH (ref 0.61–1.24)
GFR, Estimated: 54 mL/min — ABNORMAL LOW (ref >60)
Glucose, Bld: 91 mg/dL (ref 70–99)
Potassium: 3.8 mmol/L (ref 3.5–5.1)
Sodium: 140 mmol/L (ref 135–145)

## 2022-07-07 LAB — MAGNESIUM: Magnesium: 2.5 mg/dL — ABNORMAL HIGH (ref 1.7–2.4)
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
## 2022-07-08 DIAGNOSIS — I10 Essential (primary) hypertension: Secondary | ICD-10-CM | POA: Diagnosis not present

## 2022-07-08 DIAGNOSIS — N400 Enlarged prostate without lower urinary tract symptoms: Secondary | ICD-10-CM | POA: Diagnosis not present

## 2022-07-08 DIAGNOSIS — J449 Chronic obstructive pulmonary disease, unspecified: Secondary | ICD-10-CM | POA: Diagnosis not present

## 2022-07-08 DIAGNOSIS — I4891 Unspecified atrial fibrillation: Secondary | ICD-10-CM | POA: Diagnosis not present

## 2022-07-08 DIAGNOSIS — I5020 Unspecified systolic (congestive) heart failure: Secondary | ICD-10-CM | POA: Diagnosis not present

## 2022-07-08 MED ORDER — AMIODARONE HCL 200 MG PO TABS: 200.0000 mg | ORAL_TABLET | Freq: Two times a day (BID) | ORAL | 1 refills | Status: DC

## 2022-07-08 MED ORDER — POTASSIUM CHLORIDE CRYS ER 20 MEQ PO TBCR
20.0000 meq | EXTENDED_RELEASE_TABLET | Freq: Once | ORAL | Status: AC
  Administered 2022-07-08: 20 meq via ORAL
  Filled 2022-07-08: qty 1

## 2022-07-08 MED ORDER — FUROSEMIDE 10 MG/ML IJ SOLN
60.0000 mg | Freq: Once | INTRAMUSCULAR | Status: AC
  Administered 2022-07-08: 60 mg via INTRAVENOUS
  Filled 2022-07-08: qty 6

## 2022-07-08 MED ORDER — AMIODARONE HCL 200 MG PO TABS
200.0000 mg | ORAL_TABLET | Freq: Two times a day (BID) | ORAL | Status: DC
  Administered 2022-07-08: 200 mg via ORAL
  Filled 2022-07-08: qty 1

## 2022-07-08 NOTE — Progress Notes (Addendum)
Progress Note  Patient Name: Timothy Mata Date of Encounter: 07/08/2022  [facility] Cardiologist: Dietrich Pates, MD    Subjective   Wants to go home.  Inpatient Medications    Scheduled Meds:  amiodarone  150 mg Intravenous Once   apixaban  5 mg Oral BID   atorvastatin  40 mg Oral Daily   Chlorhexidine Gluconate Cloth  6 each Topical Daily   gabapentin  100 mg Oral TID   metoprolol succinate  100 mg Oral q AM   mometasone-formoterol  2 puff Inhalation BID   sodium chloride flush  3 mL Intravenous Q12H   tamsulosin  0.4 mg Oral QPC supper   Continuous Infusions:  sodium chloride Stopped (07/05/22 1221)   amiodarone 30 mg/hr (07/08/22 0635)   PRN Meds: sodium chloride, acetaminophen **OR** acetaminophen, levalbuterol, morphine injection, ondansetron **OR** ondansetron (ZOFRAN) IV, mouth rinse, oxyCODONE, sodium chloride flush   Vital Signs    Vitals:   07/08/22 0700 07/08/22 0727 07/08/22 0800 07/08/22 0838  BP: (!) 143/89  127/77   Pulse: (!) 54 (!) 52 (!) 113   Resp: (!) 23 19 17    Temp:  (!) 97.4 F (36.3 C)    TempSrc:  Oral    SpO2: 98% 100% 97% 94%  Weight:      Height:        Intake/Output Summary (Last 24 hours) at 07/08/2022 0918 Last data filed at 07/08/2022 0400 Gross per 24 hour  Intake 815.96 ml  Output 400 ml  Net 415.96 ml      07/08/2022    6:00 AM 07/06/2022    5:00 AM 07/04/2022   11:30 PM  Last 3 Weights  Weight (lbs) 240 lb 1.3 oz 235 lb 3.7 oz 230 lb 9.6 oz  Weight (kg) 108.9 kg 106.7 kg 104.599 kg      Telemetry    Afib 90's - Personally Reviewed  ECG     Physical Exam    GEN: No acute distress.   Neck: No JVD Cardiac: irreg irreg, no murmurs, rubs, or gallops.  Respiratory: Clear to auscultation bilaterally. GI: Soft, nontender, non-distended  MS: trace edema; No deformity. Neuro:  Nonfocal  Psych: Normal affect   Labs    High Sensitivity Troponin:  No results for input(s): "TROPONINIHS" in the last 720 hours.    Chemistry Recent Labs  Lab 07/05/22 0436 07/06/22 0403 07/07/22 0348  NA 141 141 140  K 3.8 3.4* 3.8  CL 107 108 108  CO2 28 26 27   GLUCOSE 98 98 91  BUN 19 17 16   CREATININE 1.34* 1.18 1.30*  CALCIUM 7.8* 8.1* 8.0*  MG 1.8 1.8 2.5*  PROT 5.7*  --   --   ALBUMIN 2.6*  --   --   AST 15  --   --   ALT 13  --   --   ALKPHOS 102  --   --   BILITOT 0.5  --   --   GFRNONAA 52* >60 54*  ANIONGAP 6 7 5     Lipids No results for input(s): "CHOL", "TRIG", "HDL", "LABVLDL", "LDLCALC", "CHOLHDL" in the last 168 hours.  Hematology Recent Labs  Lab 07/04/22 1705 07/05/22 0436 07/06/22 0403  WBC 10.8* 10.5 9.9  RBC 4.64 3.97* 3.94*  HGB 13.1 11.1* 11.2*  HCT 42.1 36.7* 35.8*  MCV 90.7 92.4 90.9  MCH 28.2 28.0 28.4  MCHC 31.1 30.2 31.3  RDW 15.8* 15.5 15.8*  PLT 292 269 258   Thyroid  Recent Labs  Lab 07/05/22 0436 07/05/22 0859  TSH 4.742*  --   FREET4  --  1.02    BNPNo results for input(s): "BNP", "PROBNP" in the last 168 hours.  DDimer No results for input(s): "DDIMER" in the last 168 hours.   Radiology    No results found.  Cardiac Studies   TEE @ [facility] 06/07/22  -  SUMMARY  The left ventricular size is normal. Left ventricular systolic  function is mildly reduced (function may be underestimated in setting  of tachyarrhythmia 120-140s bpm).  The right ventricular size and systolic function is normal.  The left atrium is mildly dilated.  No thrombus is detected in the left atrial appendage.  There is no significant valvular stenosis or regurgitation.  There is no Doppler evidence for a patent foramen ovale.   -  FINDINGS:  LEFT VENTRICLE  The left ventricular size is normal. Left ventricular systolic  function is mildly reduced.   -  RIGHT VENTRICLE  The right ventricle is normal size. The right ventricular systolic  function is normal.   LEFT ATRIUM  The left atrium is mildly dilated. No thrombus is detected in the left  atrial appendage.    RIGHT ATRIUM  Right atrial size is normal. There is no Doppler evidence for a patent  foramen ovale.  -  AORTIC VALVE  The aortic valve is trileaflet. The aortic valve is normal in  structure and function. There is no aortic regurgitation.  -  MITRAL VALVE  The mitral valve is normal in structure and function. There is trace  mitral regurgitation.  -  TRICUSPID VALVE  The tricuspid valve is normal in structure and function. No tricuspid  regurgitation.  -  PULMONIC VALVE  The pulmonic valve is normal in structure and function. There is no  pulmonic valvular regurgitation.  -  ARTERIES  Moderate atherosclerotic plaque(s) in the descending aorta.  -  VENOUS  Pulmonary venous flow pattern is blunted.  -  EFFUSION  There is no pericardial effusion.  -  3D FINDINGS  3D not performed.  -  -  MISC  There is no significant valvular stenosis or regurgitation.    2D echo 06/03/22  SUMMARY  The left ventricular size is normal with mild concentric hypertrophy.  Left ventricular systolic function is mildly reduced; LVEF = 45%.  The right ventricle is mildly dilated. The right ventricular systolic  function is mildly reduced.  Mitral annular calcification and thickened mitral leaflets with  moderate mitral regurgitation.  There is trivial pericardial effusion.  There is no comparison study available.     Patient Profile     88 y.o. male  with a hx of persistent atrial flutter (dx 02/2022), paroxysmal atrial fibrillation, HTN, HLD, CKD 3(a-b), BPH, COPD, obesity, traumatic hemothorax, chronic HFmrEF (EF 45% 05/2022 at [facility]), aortic atherosclerosis on echo 05/2022 who is being seen 07/05/2022 for the evaluation of atrial flutter at the request of Dr. Clearence Ped.   Assessment & Plan     1. Recurrent persistent atrial flutter, also with history of atrial fibrillation - this is 3rd event of atrial flutter RVR following 2 prior cardioversions therefore I do not think it would be  clinically beneficial to jump straight to a third repeat DCCV without consideration of antiarrhythmic therapy - suspect he would benefit from amiodarone -   Eliquis   has been on renal dosed Eliquis due to age and Cr >1.5 and recent right hemothorax, though this has improved to  the 1.3-1.4 range Dr. Anne Fu recommends continuing low dose eliquis. Patient hoping to go home today. Will discuss home oral dose with Dr. Tenny Craw.   2. Cardiomyopathy with chronic HFmrEF - suspect tachy-induced or stress induced last admission from lawn mower accident and recurrent atrial arrhythmias - volume status looks OK, not requiring any standing diuretic-trace leg edema he says is chronic  - BP too soft to advance GDMT   3. Aortic atherosclerosis, HLD - continue home statin - LDL 76 in 02/2022 at which time simvastatin was switched to atorvastatin, lipids otherwise followed by PCP   4. CKD stage 3a-b-Crt 1.30 yest    5. Abnormal TSH, mild anemia - check free T4 - mgmt of Hgb per IM        For questions or updates, please contact [facility] Please consult [email or web address] for contact info under        Signed, Jacolyn Reedy, PA-C  07/08/2022, 9:18 AM    Patient seen and examined   I agree with findings of Leda Gauze above     I had seen patient back in the winter   Found to be in afib   Underwent cardioversion to SR    Seen by B Strader after .  Was back in afib (Rate controlled) and he felt no different than right after cardioversion After that admitted to [facility]    Was in afib there   Underwent cardioversion       Presented hear on 7/6 with probable atypical atrial flutter with RVR   BP low prior to admitt Here he has been treated with IV amiodarone.   Now PO   Currently on 200 bid along with Toprol XL 100 daily    BP has been labile    90s to 140s.      On exam, pt in NAD  Neck   Full   JVP appears mildly increased Lungs are CTA Cardiac exam   Irreg irreg   No S3  No mrumurs Ext with 1+  edema   Impression:  Afib / fultter Get EKG now that rate is slowed    Keep on amiodarone 200 bid     Keep on Toprol XL    Contnue anticagulation Will review with EP further Rx     He has appt in cardiology, may adjust so that he sees EP  HFmrEF  Volume is up some on exam   He is + on I/O I would give lasix IV x 1   Follow   Ambulate to follow HR and BP May be able to go home later today if feels OK and rates contrlled.    Dietrich Pates MD

## 2022-07-08 NOTE — Progress Notes (Signed)
EKG done. Dr Tenny Craw and Dr Laural Benes made aware that EKG completed and to look over.

## 2022-07-08 NOTE — TOC Transition Note (Signed)
Transition of Care [facility]) - CM/SW Discharge Note   Patient Details  Name: Izaak Sahr MRN: [id number] Date of Birth: July 13, 1933  [facility] CM/SW Contact:  Elliot Gault, LCSW Phone Number: 07/08/2022, 4:13 PM   Clinical Narrative:     Pt stable for dc home today per MD. Plan remains for return home with resumption of [facility] services. Spoke with Ladona Ridgel at [employer] to update on dc. DC clinical sent as requested.  No other TOC needs for dc.  Final next level of care: Home w Home Health Services Barriers to Discharge: Barriers Resolved   Patient Goals and CMS Choice Patient states their goals for this hospitalization and ongoing recovery are:: return home CMS Medicare.gov Compare Post Acute Care list provided to:: Patient Choice offered to / list presented to : Patient  Discharge Placement                       Discharge Plan and Services In-house Referral: Clinical Social Work Discharge Planning Services: CM Consult Post Acute Care Choice: Home Health                               Social Determinants of Health (SDOH) Interventions     Readmission Risk Interventions     No data to display

## 2022-07-08 NOTE — Discharge Instructions (Signed)
IMPORTANT INFORMATION: PAY CLOSE ATTENTION  ° °PHYSICIAN DISCHARGE INSTRUCTIONS ° °Follow with Primary care provider  Skillman, Katherine E, PA-C  and other consultants as instructed by your Hospitalist Physician ° °SEEK MEDICAL CARE OR RETURN TO EMERGENCY ROOM IF SYMPTOMS COME BACK, WORSEN OR NEW PROBLEM DEVELOPS  ° °Please note: °You were cared for by a hospitalist during your hospital stay. Every effort will be made to forward records to your primary care provider.  You can request that your primary care provider send for your hospital records if they have not received them.  Once you are discharged, your primary care physician will handle any further medical issues. Please note that NO REFILLS for any discharge medications will be authorized once you are discharged, as it is imperative that you return to your primary care physician (or establish a relationship with a primary care physician if you do not have one) for your post hospital discharge needs so that they can reassess your need for medications and monitor your lab values. ° °Please get a complete blood count and chemistry panel checked by your Primary MD at your next visit, and again as instructed by your Primary MD. ° °Get Medicines reviewed and adjusted: °Please take all your medications with you for your next visit with your Primary MD ° °Laboratory/radiological data: °Please request your Primary MD to go over all hospital tests and procedure/radiological results at the follow up, please ask your primary care provider to get all Hospital records sent to his/her office. ° °In some cases, they will be blood work, cultures and biopsy results pending at the time of your discharge. Please request that your primary care provider follow up on these results. ° °If you are diabetic, please bring your blood sugar readings with you to your follow up appointment with primary care.   ° °Please call and make your follow up appointments as soon as possible.    ° °Also Note the following: °If you experience worsening of your admission symptoms, develop shortness of breath, life threatening emergency, suicidal or homicidal thoughts you must seek medical attention immediately by calling 911 or calling your MD immediately  if symptoms less severe. ° °You must read complete instructions/literature along with all the possible adverse reactions/side effects for all the Medicines you take and that have been prescribed to you. Take any new Medicines after you have completely understood and accpet all the possible adverse reactions/side effects.  ° °Do not drive when taking Pain medications or sleeping medications (Benzodiazepines) ° °Do not take more than prescribed Pain, Sleep and Anxiety Medications. It is not advisable to combine anxiety,sleep and pain medications without talking with your primary care practitioner ° °Special Instructions: If you have smoked or chewed Tobacco  in the last 2 yrs please stop smoking, stop any regular Alcohol  and or any Recreational drug use. ° °Wear Seat belts while driving.  Do not drive if taking any narcotic, mind altering or controlled substances or recreational drugs or alcohol.  ° ° ° ° ° °
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
## 2022-07-08 NOTE — Progress Notes (Signed)
Went over discharge instructions, follow up appointments and medication education with patient. All questions answered and patient expressed full understanding of instructions, appointment information and education. Patient up to chair with stand by assist. Gait steady. Patient was ambulated around unit x1 with heart rate in the low 100's. Patient denied any shortness of breath or chest discomfort. Dr Laural Benes made aware. Patient tolerated PO meds and diet well. Appetite good. Patient currently denies any shortness of breath, chest pain, dizziness, nausea or vomiting. IV removed with no complications. Patient discharged home with all belongings via car.

## 2022-07-18 ENCOUNTER — Encounter: Payer: Self-pay | Admitting: Student

## 2022-07-18 ENCOUNTER — Ambulatory Visit (INDEPENDENT_AMBULATORY_CARE_PROVIDER_SITE_OTHER): Payer: Medicare HMO | Admitting: Student

## 2022-07-18 VITALS — BP 116/58 | HR 62 | Ht 70.0 in | Wt 224.0 lb

## 2022-07-18 DIAGNOSIS — Z79899 Other long term (current) drug therapy: Secondary | ICD-10-CM | POA: Diagnosis not present

## 2022-07-18 DIAGNOSIS — I10 Essential (primary) hypertension: Secondary | ICD-10-CM

## 2022-07-18 DIAGNOSIS — I428 Other cardiomyopathies: Secondary | ICD-10-CM | POA: Diagnosis not present

## 2022-07-18 DIAGNOSIS — I4819 Other persistent atrial fibrillation: Secondary | ICD-10-CM | POA: Diagnosis not present

## 2022-07-18 DIAGNOSIS — E785 Hyperlipidemia, unspecified: Secondary | ICD-10-CM

## 2022-07-18 MED ORDER — AMIODARONE HCL 200 MG PO TABS: 200.0000 mg | ORAL_TABLET | Freq: Every day | ORAL | 3 refills | Status: DC

## 2022-07-18 NOTE — Progress Notes (Signed)
Cardiology Office Note    Date:  07/18/2022   ID:  Timothy, Mata [birth year], MRN [id number]  PCP:  Rosalee Kaufman, PA-C  Cardiologist: Dorris Carnes, MD    Chief Complaint  Patient presents with   Hospitalization Follow-up    History of Present Illness:    Timothy Mata is a 88 y.o. male with past medical history of persistent atrial flutter (diagnosed in 02/2022, s/p DCCV in 03/2022 with recurrence at the time of follow-up and rate-control pursued), HFmrEF (EF 45% in 05/2022), HTN, HLD, BPH and COPD who presents to the office today for hospital follow-up.  He was evaluated by myself in 04/2022 following a recent admission for atrial flutter with RVR. He had undergone outpatient DCCV and was back in atrial fibrillation at the time of his office visit. His case was reviewed with Dr. Harrington Challenger and a rate control strategy was recommended and he was continued on Atenolol 25 mg daily and Cardizem CD 240 mg daily.  In the interim, he was admitted to [facility] in 05/2022 for a right hemothorax requiring chest tube placement and VATS procedure. He did undergo TEE/DCCV that admission and converted back to normal sinus rhythm with Atenolol and Cardizem CD being discontinued and transitioned to Toprol-XL 100 mg daily.  They did report elevated rates and Toprol-XL was titrated to 125 mg daily. He was ultimately instructed to present back to the ED on 07/04/2022 given worsening weakness and elevated heart rate. He was found to be in recurrent atrial flutter with RVR and given his multiple unsuccessful cardioversions in the past, he was started on IV Amiodarone. He wanted to go home the following day and was transitioned to PO Amiodarone 200 mg twice daily along while remaining on Toprol-XL and Eliquis.  In talking with the patient today, he reports overall doing well since his recent hospitalization. Says that his energy level has significantly improved. He denies any recent chest pain or dyspnea on  exertion. No recent orthopnea, PND, dizziness or presyncope. He does experience occasional lower extremity edema and elevates his legs when able. He has been checking his heart rate at home and it has been well controlled in the 60's.   Past Medical History:  Diagnosis Date   Atrial flutter ([place])    a. s/p DCCV in 03/2022 b. repeat EKG at follow-up from DCCV showing atrial fibrillation   BPH (benign prostatic hyperplasia)    COPD (chronic obstructive pulmonary disease) ([place])    Dysrhythmia    Gout    "left big toe"   History of kidney stones    HOH (hard of hearing)    Hypercholesterolemia    Hypertension    Preretinal fibrosis, left eye    Spinal headache 12/30/1972    Past Surgical History:  Procedure Laterality Date   25 GAUGE PARS PLANA VITRECTOMY WITH 20 GAUGE MVR PORT Left 07/18/2015   Procedure: 25 GAUGE PARS PLANA VITRECTOMY WITH 20 GAUGE MVR PORT;  Surgeon: Hayden Pedro, MD;  Location: [facility];  Service: Ophthalmology;  Laterality: Left;   AIR/FLUID EXCHANGE Left 07/18/2015   Procedure: AIR/FLUID EXCHANGE;  Surgeon: Hayden Pedro, MD;  Location: [facility];  Service: Ophthalmology;  Laterality: Left;   BACK SURGERY     CARDIOVERSION N/A 04/26/2022   Procedure: CARDIOVERSION;  Surgeon: Satira Sark, MD;  Location: [facility];  Service: Cardiovascular;  Laterality: N/A;   EYE SURGERY     FOOT FRACTURE SURGERY Right 1981   [place]  Left 07/18/2015   Procedure: LASER PHOTO ABLATION;  Surgeon: Hayden Pedro, MD;  Location: [facility];  Service: Ophthalmology;  Laterality: Left;  head scope    LUMBAR DISC SURGERY  1974   MEMBRANE PEEL Left 07/18/2015   Procedure: MEMBRANE PEEL;  Surgeon: Hayden Pedro, MD;  Location: [facility];  Service: Ophthalmology;  Laterality: Left;   MULTIPLE TOOTH EXTRACTIONS  1990's   PARS PLANA VITRECTOMY Left 07/18/2015    Current Medications: Outpatient Medications Prior to Visit  Medication Sig Dispense Refill   allopurinol (ZYLOPRIM) 300  MG tablet Take 300 mg by mouth daily after supper.   2   apixaban (ELIQUIS) 2.5 MG TABS tablet Take 1 tablet (2.5 mg total) by mouth 2 (two) times daily. 60 tablet 11   atorvastatin (LIPITOR) 40 MG tablet Take 1 tablet (40 mg total) by mouth daily. 90 tablet 3   Fluticasone-Salmeterol (ADVAIR) 250-50 MCG/DOSE AEPB Inhale 1 puff into the lungs 2 (two) times daily.     furosemide (LASIX) 40 MG tablet Take 40 mg by mouth 2 (two) times daily.     gabapentin (NEURONTIN) 100 MG capsule Take 100 mg by mouth 3 (three) times daily.     hydrocortisone cream 1 % Apply 1 application. topically 2 (two) times daily as needed for itching.     metoprolol succinate (TOPROL-XL) 100 MG 24 hr tablet Take 1 tablet (100 mg total) by mouth in the morning. Take with or immediately following a meal. 90 tablet 3   nitroGLYCERIN (NITROSTAT) 0.4 MG SL tablet Place 1 tablet (0.4 mg total) under the tongue every 5 (five) minutes x 3 doses as needed for chest pain. 10 tablet 12   tamsulosin (FLOMAX) 0.4 MG CAPS capsule Take 1 capsule (0.4 mg total) by mouth daily after supper. 30 capsule 3   amiodarone (PACERONE) 200 MG tablet Take 1 tablet (200 mg total) by mouth 2 (two) times daily. 60 tablet 1   No facility-administered medications prior to visit.     Allergies:   Patient has no allergy information on record.   Social History   Socioeconomic History   Marital status: Married    Spouse name: Not on file   Number of children: Not on file   Years of education: Not on file   Highest education level: Not on file  Occupational History   Not on file  Tobacco Use   Smoking status: Former    Packs/day: 1.00    Years: 32.00    Total pack years: 32.00    Types: Cigarettes   Smokeless tobacco: Never   Tobacco comments:    quit smoking at age 8  Vaping Use   Vaping Use: Never used  Substance and Sexual Activity   Alcohol use: No   Drug use: No   Sexual activity: Not on file  Other Topics Concern   Not on file   Social History Narrative   Not on file   Social Determinants of Health   Financial Resource Strain: Not on file  Food Insecurity: Not on file  Transportation Needs: Not on file  Physical Activity: Not on file  Stress: Not on file  Social Connections: Not on file     Family History:  The patient's family history includes Cancer in his father.   Review of Systems:    Please see the history of present illness.     All other systems reviewed and are otherwise negative except as noted above.   Physical Exam:  VS:  BP (!) 116/58   Pulse 62   Ht 5\' 10"  (1.778 m)   Wt 224 lb (101.6 kg)   SpO2 95%   BMI 32.14 kg/m    General: Pleasant elderly male appearing in no acute distress. Head: Normocephalic, atraumatic. Neck: No carotid bruits. JVD not elevated.  Lungs: Respirations regular and unlabored, without wheezes or rales.  Heart: Regular rate and rhythm. No S3 or S4.  No murmur, no rubs, or gallops appreciated. Abdomen: Appears non-distended. No obvious abdominal masses. Msk:  Strength and tone appear normal for age. No obvious joint deformities or effusions. Extremities: No clubbing or cyanosis. Trace lower extremity edema.  Distal pedal pulses are 2+ bilaterally. Neuro: Alert and oriented X 3. Moves all extremities spontaneously. No focal deficits noted. Psych:  Responds to questions appropriately with a normal affect. Skin: No rashes or lesions noted  Wt Readings from Last 3 Encounters:  07/18/22 224 lb (101.6 kg)  07/08/22 240 lb 1.3 oz (108.9 kg)  05/08/22 245 lb 9.6 oz (111.4 kg)     Studies/Labs Reviewed:   EKG:  EKG is ordered today. The ekg ordered today demonstrates NSR, HR 62 with 1st degree AV block. Computer generated QTc at 499 ms but calculated corrected QTc at 447 ms.   Recent Labs: 07/05/2022: ALT 13; TSH 4.742 07/06/2022: Hemoglobin 11.2; Platelets 258 07/07/2022: BUN 16; Creatinine, Ser 1.30; Magnesium 2.5; Potassium 3.8; Sodium 140   Lipid Panel     Component Value Date/Time   CHOL 143 03/25/2022 0117   TRIG 115 03/25/2022 0117   HDL 44 03/25/2022 0117   CHOLHDL 3.3 03/25/2022 0117   VLDL 23 03/25/2022 0117   LDLCALC 76 03/25/2022 0117    Additional studies/ records that were reviewed today include:   Echocardiogram: 03/25/2022 IMPRESSIONS     1. Left ventricular ejection fraction, by estimation, is 60 to 65%. The  left ventricle has normal function. The left ventricle has no regional  wall motion abnormalities. Left ventricular diastolic function could not  be evaluated.   2. Right ventricular systolic function is normal. The right ventricular  size is normal.   3. The mitral valve is normal in structure. Trivial mitral valve  regurgitation. No evidence of mitral stenosis.   4. The aortic valve is tricuspid. Aortic valve regurgitation is not  visualized. Aortic valve sclerosis is present, with no evidence of aortic  valve stenosis.   5. The inferior vena cava is dilated in size with >50% respiratory  variability, suggesting right atrial pressure of 8 mmHg.   Comparison(s): No prior Echocardiogram.   Echocardiogram: 06/03/2022 SUMMARY  The left ventricular size is normal with mild concentric hypertrophy.  Left ventricular systolic function is mildly reduced; LVEF = 45%.  The right ventricle is mildly dilated. The right ventricular systolic  function is mildly reduced.  Mitral annular calcification and thickened mitral leaflets with  moderate mitral regurgitation.  There is trivial pericardial effusion.  There is no comparison study available.    Assessment:    1. Persistent atrial fibrillation (HCC)   2. Nonischemic cardiomyopathy (HCC)   3. Medication management   4. Essential hypertension   5. Hyperlipidemia LDL goal <70      Plan:   In order of problems listed above:  1. Persistent Atrial Fibrillation/Flutter - Given 2 prior failed DCCV's, he was started on antiarrhythmic therapy during his recent  admission with Amiodarone. He has converted back to normal sinus rhythm in the interim and given that he has  been on 200 mg twice daily for several weeks, will reduce Amiodarone dosing to 200 mg daily. Continue Toprol-XL 100 mg daily. He does have previously scheduled follow-up with EP next month which was arranged by Dr. Harrington Challenger and will keep that visit. - He is on Eliquis 2.5mg  BID and has been on reduced dosing given his age and variable renal function. Also had a hemothorax in 05/2022 and was recommended to continue with the reduced dose at that time. No reports of active bleeding.    2. HFmrEF - His EF was at 45% in 05/2022 and felt to be tachycardia mediated in the setting of atrial flutter with RVR and he had also been admitted for a hemothorax during that admission. - He appears euvolemic on examination today and says that his weight has been stable on his home scales.  Remains on Lasix 40 mg twice daily and Toprol-XL 100 mg daily. Creatinine was stable at 1.30 by recent labs in 06/2021 as his baseline appears to be between 1.5 - 1.7. Will recheck a limited echocardiogram in 3 months for reassessment of his EF. If this has not normalized, he will require further medication adjustments.   3. HTN - His BP is well controlled at 116/58 during today's visit. Continue current medication regimen with Toprol-XL 100 mg daily.  4. HLD - FLP in 02/2022 showed total cholesterol 143, triglycerides 115, HDL 44 and LDL 76. He remains on Atorvastatin 40 mg daily.   Medication Adjustments/Labs and Tests Ordered: Current medicines are reviewed at length with the patient today.  Concerns regarding medicines are outlined above.  Medication changes, Labs and Tests ordered today are listed in the Patient Instructions below. Patient Instructions  Medication Instructions:   Decrease Amiodarone to 200 mg Daily  Take Toprol XL 100 mg Daily   *If you need a refill on your cardiac medications before your next  appointment, please call your pharmacy*   Lab Work: NONE   If you have labs (blood work) drawn today and your tests are completely normal, you will receive your results only by: [place] (if you have MyChart) OR A paper copy in the mail If you have any lab test that is abnormal or we need to change your treatment, we will call you to review the results.   Testing/Procedures: Your physician has requested that you have an echocardiogram. Echocardiography is a painless test that uses sound waves to create images of your heart. It provides your doctor with information about the size and shape of your heart and how well your heart's chambers and valves are working. This procedure takes approximately one hour. There are no restrictions for this procedure.    Follow-Up: At [facility], you and your health needs are our priority.  As part of our continuing mission to provide you with exceptional heart care, we have created designated Provider Care Teams.  These Care Teams include your primary Cardiologist (physician) and Advanced Practice Providers (APPs -  Physician Assistants and Nurse Practitioners) who all work together to provide you with the care you need, when you need it.  We recommend signing up for the patient portal called "MyChart".  Sign up information is provided on this After Visit Summary.  MyChart is used to connect with patients for Virtual Visits (Telemedicine).  Patients are able to view lab/test results, encounter notes, upcoming appointments, etc.  Non-urgent messages can be sent to your provider as well.   To learn more about what you can do with  MyChart, go to [email or web address].    Your next appointment:    September   The format for your next appointment:   In Person  Provider:   Lewayne Bunting, MD    Other Instructions Thank you for choosing [facility]!    Important Information About Sugar         Signed, Ellsworth Lennox,  PA-C  07/18/2022 5:11 PM    [facility] [address] [place], [place] [address] Phone: [phone] Fax: [phone]

## 2022-07-18 NOTE — Patient Instructions (Signed)
Medication Instructions:   Decrease Amiodarone to 200 mg Daily  Take Toprol XL 100 mg Daily   *If you need a refill on your cardiac medications before your next appointment, please call your pharmacy*   Lab Work: NONE   If you have labs (blood work) drawn today and your tests are completely normal, you will receive your results only by: MyChart Message (if you have MyChart) OR A paper copy in the mail If you have any lab test that is abnormal or we need to change your treatment, we will call you to review the results.   Testing/Procedures: Your physician has requested that you have an echocardiogram. Echocardiography is a painless test that uses sound waves to create images of your heart. It provides your doctor with information about the size and shape of your heart and how well your heart's chambers and valves are working. This procedure takes approximately one hour. There are no restrictions for this procedure.    Follow-Up: At [facility], you and your health needs are our priority.  As part of our continuing mission to provide you with exceptional heart care, we have created designated Provider Care Teams.  These Care Teams include your primary Cardiologist (physician) and Advanced Practice Providers (APPs -  Physician Assistants and Nurse Practitioners) who all work together to provide you with the care you need, when you need it.  We recommend signing up for the patient portal called "MyChart".  Sign up information is provided on this After Visit Summary.  MyChart is used to connect with patients for Virtual Visits (Telemedicine).  Patients are able to view lab/test results, encounter notes, upcoming appointments, etc.  Non-urgent messages can be sent to your provider as well.   To learn more about what you can do with MyChart, go to [email or web address].    Your next appointment:    September   The format for your next appointment:   In Person  Provider:   Lewayne Bunting, MD    Other Instructions Thank you for choosing [facility]!    Important Information About Sugar

## 2022-08-15 ENCOUNTER — Ambulatory Visit: Payer: Medicare HMO | Admitting: Student

## 2022-09-03 ENCOUNTER — Ambulatory Visit: Payer: Medicare HMO | Attending: Internal Medicine | Admitting: Internal Medicine

## 2022-09-03 ENCOUNTER — Encounter: Payer: Self-pay | Admitting: Internal Medicine

## 2022-09-03 VITALS — BP 96/50 | HR 66 | Ht 70.0 in | Wt 235.6 lb

## 2022-09-03 DIAGNOSIS — I4819 Other persistent atrial fibrillation: Secondary | ICD-10-CM | POA: Diagnosis not present

## 2022-09-03 DIAGNOSIS — I10 Essential (primary) hypertension: Secondary | ICD-10-CM | POA: Diagnosis not present

## 2022-09-03 NOTE — Progress Notes (Signed)
HPI Timothy Mata is referred  by [place] PA for evaluation of atypical atrial flutter. He is a pleasant 88 yo man with a h/o COPD, dyslipidemia, and atrial flutter/fib who has undergone DCCV twice. He has been on amiodarone. He had a hemothorax after falling off of his riding mower. He feels well on amio and in NSR. He denies palpitations. No syncope.  Not on File   Current Outpatient Medications  Medication Sig Dispense Refill   allopurinol (ZYLOPRIM) 300 MG tablet Take 300 mg by mouth daily after supper.   2   amiodarone (PACERONE) 200 MG tablet Take 1 tablet (200 mg total) by mouth daily. 90 tablet 3   apixaban (ELIQUIS) 2.5 MG TABS tablet Take 1 tablet (2.5 mg total) by mouth 2 (two) times daily. 60 tablet 11   atorvastatin (LIPITOR) 40 MG tablet Take 1 tablet (40 mg total) by mouth daily. 90 tablet 3   Fluticasone-Salmeterol (ADVAIR) 250-50 MCG/DOSE AEPB Inhale 1 puff into the lungs 2 (two) times daily.     furosemide (LASIX) 40 MG tablet Take 40 mg by mouth 2 (two) times daily.     gabapentin (NEURONTIN) 100 MG capsule Take 100 mg by mouth 3 (three) times daily.     hydrocortisone cream 1 % Apply 1 application. topically 2 (two) times daily as needed for itching.     metoprolol succinate (TOPROL-XL) 100 MG 24 hr tablet Take 1 tablet (100 mg total) by mouth in the morning. Take with or immediately following a meal. 90 tablet 3   nitroGLYCERIN (NITROSTAT) 0.4 MG SL tablet Place 1 tablet (0.4 mg total) under the tongue every 5 (five) minutes x 3 doses as needed for chest pain. 10 tablet 12   tamsulosin (FLOMAX) 0.4 MG CAPS capsule Take 1 capsule (0.4 mg total) by mouth daily after supper. 30 capsule 3   No current facility-administered medications for this visit.     Past Medical History:  Diagnosis Date   Atrial flutter (HCC)    a. s/p DCCV in 03/2022 b. repeat EKG at follow-up from DCCV showing atrial fibrillation   BPH (benign prostatic hyperplasia)    COPD (chronic  obstructive pulmonary disease) (HCC)    Dysrhythmia    Gout    "left big toe"   History of kidney stones    HOH (hard of hearing)    Hypercholesterolemia    Hypertension    Preretinal fibrosis, left eye    Spinal headache 12/30/1972    ROS:   All systems reviewed and negative except as noted in the HPI.   Past Surgical History:  Procedure Laterality Date   25 GAUGE PARS PLANA VITRECTOMY WITH 20 GAUGE MVR PORT Left 07/18/2015   Procedure: 25 GAUGE PARS PLANA VITRECTOMY WITH 20 GAUGE MVR PORT;  Surgeon: Sherrie George, MD;  Location: [facility] OR;  Service: Ophthalmology;  Laterality: Left;   AIR/FLUID EXCHANGE Left 07/18/2015   Procedure: AIR/FLUID EXCHANGE;  Surgeon: Sherrie George, MD;  Location: [facility];  Service: Ophthalmology;  Laterality: Left;   BACK SURGERY     CARDIOVERSION N/A 04/26/2022   Procedure: CARDIOVERSION;  Surgeon: Jonelle Sidle, MD;  [facility];  Service: Cardiovascular;  Laterality: N/A;   EYE SURGERY     FOOT FRACTURE SURGERY Right 1981   LASER PHOTO ABLATION Left 07/18/2015   Procedure: LASER PHOTO ABLATION;  Surgeon: Sherrie George, MD;  [facility];  Service: Ophthalmology;  Laterality: Left;  head scope  LUMBAR DISC SURGERY  1974   MEMBRANE PEEL Left 07/18/2015   Procedure: MEMBRANE PEEL;  Surgeon: Sherrie George, MD;  Location[facility];  Service: Ophthalmology;  Laterality: Left;   MULTIPLE TOOTH EXTRACTIONS  1990's   PARS PLANA VITRECTOMY Left 07/18/2015     Family History  Problem Relation Age of Onset   Cancer Father      Social History   Socioeconomic History   Marital status: Married    Spouse name: Not on file   Number of children: Not on file   Years of education: Not on file   Highest education level: Not on file  Occupational History   Not on file  Tobacco Use   Smoking status: Former    Packs/day: 1.00    Years: 32.00    Total pack years: 32.00    Types: Cigarettes   Smokeless tobacco: Never   Tobacco comments:     quit smoking at age 64  Vaping Use   Vaping Use: Never used  Substance and Sexual Activity   Alcohol use: No   Drug use: No   Sexual activity: Not on file  Other Topics Concern   Not on file  Social History Narrative   Not on file   Social Determinants of Health   Financial Resource Strain: Not on file  Food Insecurity: Not on file  Transportation Needs: Not on file  Physical Activity: Not on file  Stress: Not on file  Social Connections: Not on file  Intimate Partner Violence: Not on file     BP (!) 96/50   Pulse 66   Ht 5\' 10"  (1.778 m)   Wt 235 lb 9.6 oz (106.9 kg)   SpO2 95%   BMI 33.81 kg/m   Physical Exam:  Well appearing NAD HEENT: Unremarkable Neck:  No JVD, no thyromegally Lymphatics:  No adenopathy Back:  No CVA tenderness Lungs:  Clear with no wheezes HEART:  Regular rate rhythm, no murmurs, no rubs, no clicks Abd:  soft, positive bowel sounds, no organomegally, no rebound, no guarding Ext:  2 plus pulses, no edema, no cyanosis, no clubbing Skin:  No rashes no nodules Neuro:  CN II through XII intact, motor grossly intact  EKG - reviewed. NSR  Assess/Plan:  Atypical atrial flutter - He is maintaining NSR on amiodarone. I would continue 200 mg daily.  HTN - his bp is actually low today. Hemothorax - he is s/p surgery and doing well with no residual symptoms.   Exie Chrismer,MD

## 2022-09-03 NOTE — Patient Instructions (Signed)
Medication Instructions:  Your physician recommends that you continue on your current medications as directed. Please refer to the Current Medication list given to you today.  *If you need a refill on your cardiac medications before your next appointment, please call your pharmacy*   Lab Work: NONE   If you have labs (blood work) drawn today and your tests are completely normal, you will receive your results only by: MyChart Message (if you have MyChart) OR A paper copy in the mail If you have any lab test that is abnormal or we need to change your treatment, we will call you to review the results.   Testing/Procedures: NONE    Follow-Up: At [facility], you and your health needs are our priority.  As part of our continuing mission to provide you with exceptional heart care, we have created designated Provider Care Teams.  These Care Teams include your primary Cardiologist (physician) and Advanced Practice Providers (APPs -  Physician Assistants and Nurse Practitioners) who all work together to provide you with the care you need, when you need it.  We recommend signing up for the patient portal called "MyChart".  Sign up information is provided on this After Visit Summary.  MyChart is used to connect with patients for Virtual Visits (Telemedicine).  Patients are able to view lab/test results, encounter notes, upcoming appointments, etc.  Non-urgent messages can be sent to your provider as well.   To learn more about what you can do with MyChart, go to [email or web address].    Your next appointment:   6 month(s)  The format for your next appointment:   In Person  Provider:   Gregg Taylor, MD    Other Instructions Thank you for choosing [facility]!    Important Information About Sugar       

## 2022-09-20 ENCOUNTER — Telehealth: Payer: Self-pay | Admitting: Internal Medicine

## 2022-09-20 NOTE — Telephone Encounter (Signed)
Pt c/o medication issue:  1. Name of Medication: Dilitazem   2. How are you currently taking this medication (dosage and times per day)? 240 mg   3. Are you having a reaction (difficulty breathing--STAT)?   4. What is your medication issue? Pt wife calling wanting to know if pt is supposed to be taking Ditalizem prescribed by Bernerd Pho, [place]. Did not see on medication list. Please advise.
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
## 2022-09-23 NOTE — Telephone Encounter (Signed)
   He was not listed as taking Cardizem at the time of his office visit in 06/2022 or 08/2022. His HR was well-controlled in the 60's when he saw Dr. Lovena Le in 08/2022 on Amiodarone and Toprol-XL. Would just continue those two for rate/rhythm control currently.   Signed, Erma Heritage, PA-C 09/23/2022, 8:28 PM

## 2022-09-24 NOTE — Telephone Encounter (Signed)
Returned call to [employer]. No answer. Left msg to call back.

## 2022-09-25 NOTE — Telephone Encounter (Signed)
Returned call to wife.no answer, left msg to call back.  

## 2022-09-26 NOTE — Telephone Encounter (Signed)
Left a message for patient/spouse to call office back.  

## 2022-09-26 NOTE — Telephone Encounter (Signed)
Pts spouse notified and verbalized understanding.

## 2022-10-10 ENCOUNTER — Other Ambulatory Visit: Payer: Medicare HMO

## 2022-10-14 ENCOUNTER — Ambulatory Visit: Payer: Medicare HMO | Attending: Student

## 2022-10-14 DIAGNOSIS — I428 Other cardiomyopathies: Secondary | ICD-10-CM | POA: Diagnosis not present

## 2022-10-14 LAB — ECHOCARDIOGRAM LIMITED
Calc EF: 74.6 %
S' Lateral: 2.83 cm
Single Plane A2C EF: 70.2 %
Single Plane A4C EF: 79.2 %

## 2022-10-28 ENCOUNTER — Other Ambulatory Visit: Payer: Self-pay

## 2022-10-28 DIAGNOSIS — I6529 Occlusion and stenosis of unspecified carotid artery: Secondary | ICD-10-CM

## 2022-10-29 ENCOUNTER — Ambulatory Visit: Payer: Medicare HMO | Attending: Internal Medicine

## 2022-10-29 DIAGNOSIS — I6521 Occlusion and stenosis of right carotid artery: Secondary | ICD-10-CM | POA: Diagnosis not present

## 2022-10-29 DIAGNOSIS — I6529 Occlusion and stenosis of unspecified carotid artery: Secondary | ICD-10-CM

## 2022-10-30 ENCOUNTER — Ambulatory Visit (INDEPENDENT_AMBULATORY_CARE_PROVIDER_SITE_OTHER): Payer: Medicare HMO | Admitting: Vascular Surgery

## 2022-10-30 ENCOUNTER — Encounter: Payer: Self-pay | Admitting: Vascular Surgery

## 2022-10-30 VITALS — BP 153/69 | HR 59 | Temp 97.7°F | Ht 70.0 in | Wt 242.0 lb

## 2022-10-30 DIAGNOSIS — I6529 Occlusion and stenosis of unspecified carotid artery: Secondary | ICD-10-CM | POA: Diagnosis not present
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
## 2022-11-06 ENCOUNTER — Other Ambulatory Visit: Payer: Self-pay

## 2022-11-06 DIAGNOSIS — I6529 Occlusion and stenosis of unspecified carotid artery: Secondary | ICD-10-CM

## 2022-11-07 ENCOUNTER — Other Ambulatory Visit (HOSPITAL_COMMUNITY)
Admission: RE | Admit: 2022-11-07 | Discharge: 2022-11-07 | Disposition: A | Discharge status: Home / Self Care | Payer: Medicare HMO | Source: Ambulatory Visit | Attending: Vascular Surgery | Admitting: Vascular Surgery

## 2022-11-07 ENCOUNTER — Ambulatory Visit (HOSPITAL_COMMUNITY): Payer: Medicare HMO

## 2022-11-07 DIAGNOSIS — I6529 Occlusion and stenosis of unspecified carotid artery: Secondary | ICD-10-CM | POA: Diagnosis present

## 2022-11-07 LAB — CREATININE, SERUM
Creatinine, Ser: 1.95 mg/dL — ABNORMAL HIGH (ref 0.61–1.24)
GFR, Estimated: 33 mL/min — ABNORMAL LOW (ref >60)

## 2022-11-07 LAB — BUN: BUN: 27 mg/dL — ABNORMAL HIGH (ref 8–23)

## 2022-11-13 ENCOUNTER — Ambulatory Visit (INDEPENDENT_AMBULATORY_CARE_PROVIDER_SITE_OTHER): Payer: Medicare HMO | Admitting: Vascular Surgery

## 2022-11-13 ENCOUNTER — Encounter: Payer: Self-pay | Admitting: Vascular Surgery

## 2022-11-13 VITALS — BP 166/73 | HR 61 | Temp 98.4°F | Ht 70.0 in | Wt 242.2 lb

## 2022-11-13 DIAGNOSIS — I6521 Occlusion and stenosis of right carotid artery: Secondary | ICD-10-CM | POA: Diagnosis not present

## 2022-11-13 NOTE — Progress Notes (Signed)
[facility] [place]  Patient name: Timothy Mata MRN: [id number] DOB: [birth year] Sex: male  REASON FOR VISIT: Follow-up right carotid disease.  HPI: Timothy Mata is a 88 y.o. male here today with follow-up.  I saw him for initial office visit on 10/30/2022.  He was found to have a severe right internal carotid artery stenosis with duplex at [facility].  He had a confirmatory duplex at heart care in [place].  He had extremely elevated systolic velocities and moderately to severe elevated diastolic velocities on both of these studies.  I had scheduled him for CT angiogram for further evaluation prior to probable right carotid endarterectomy.  He was found to have creatinine of 1.9 prior to CT and therefore this was canceled.  He is here today for further discussion.  He remains asymptomatic.  Past Medical History:  Diagnosis Date   Atrial flutter ([facility])    a. s/p DCCV in 03/2022 b. repeat EKG at follow-up from DCCV showing atrial fibrillation   BPH (benign prostatic hyperplasia)    COPD (chronic obstructive pulmonary disease) ([facility])    Dysrhythmia    Gout    "left big toe"   History of kidney stones    HOH (hard of hearing)    Hypercholesterolemia    Hypertension    Preretinal fibrosis, left eye    Spinal headache 12/30/1972    Family History  Problem Relation Age of Onset   Cancer Father     SOCIAL HISTORY: Social History   Tobacco Use   Smoking status: Former    Packs/day: 1.00    Years: 32.00    Total pack years: 32.00    Types: Cigarettes   Smokeless tobacco: Never   Tobacco comments:    quit smoking at age 69  Substance Use Topics   Alcohol use: No    No Known Allergies  Current Outpatient Medications  Medication Sig Dispense Refill   allopurinol (ZYLOPRIM) 300 MG tablet Take 300 mg by mouth daily after supper.   2   amiodarone (PACERONE) 200 MG tablet Take 1 tablet (200 mg total) by mouth daily. 90 tablet  3   apixaban (ELIQUIS) 2.5 MG TABS tablet Take 1 tablet (2.5 mg total) by mouth 2 (two) times daily. 60 tablet 11   atorvastatin (LIPITOR) 40 MG tablet Take 1 tablet (40 mg total) by mouth daily. 90 tablet 3   Fluticasone-Salmeterol (ADVAIR) 250-50 MCG/DOSE AEPB Inhale 1 puff into the lungs 2 (two) times daily.     furosemide (LASIX) 40 MG tablet Take 40 mg by mouth 2 (two) times daily.     gabapentin (NEURONTIN) 100 MG capsule Take 100 mg by mouth 3 (three) times daily.     hydrocortisone cream 1 % Apply 1 application. topically 2 (two) times daily as needed for itching.     metoprolol succinate (TOPROL-XL) 100 MG 24 hr tablet Take 1 tablet (100 mg total) by mouth in the morning. Take with or immediately following a meal. 90 tablet 3   nitroGLYCERIN (NITROSTAT) 0.4 MG SL tablet Place 1 tablet (0.4 mg total) under the tongue every 5 (five) minutes x 3 doses as needed for chest pain. 10 tablet 12   tamsulosin (FLOMAX) 0.4 MG CAPS capsule Take 1 capsule (0.4 mg total) by mouth daily after supper. 30 capsule 3   No current facility-administered medications for this visit.    REVIEW OF SYSTEMS:  [X]  denotes positive finding, [ ]  denotes negative finding Cardiac  Comments:  Chest pain or chest pressure:    Shortness of breath upon exertion:    Short of breath when lying flat:    Irregular heart rhythm:        Vascular    Pain in calf, thigh, or hip brought on by ambulation:    Pain in feet at night that wakes you up from your sleep:     Blood clot in your veins:    Leg swelling:           PHYSICAL EXAM: Vitals:   11/13/22 1325 11/13/22 1329  BP: (!) 174/67 (!) 166/73  Pulse: 61   Temp: 98.4 F (36.9 C)   SpO2: 97%   Weight: 242 lb 3.2 oz (109.9 kg)   Height: 5\' 10"  (1.778 m)     GENERAL: The patient is a well-nourished male, in no acute distress. The vital signs are documented above.  DATA:  We imaged his right carotid artery today in our office.  He does have normal internal  carotid artery above his bifurcation.  Bifurcation is in proximal to mid neck.  He does have high-grade stenosis over a focal length with calcification around this.  End-diastolic velocities are 112 cm/s.  MEDICAL ISSUES: Great asymptomatic carotid stenosis.  I again discussed options with the patient.  I feel he is best served with right carotid endarterectomy for reduction of stroke risk.  I did discuss complications to include 1 to 1-1/2% risk of stroke with surgery and very unlikely possibility of wound issues.  He understands and wishes to proceed with surgery as soon as possible.  He understands this would require probable 1 night hospitalization.  We will schedule this with Dr. at his earliest convenience    Chestine Spore, MD [facility] Tel [phone]  Note: Portions of this report may have been transcribed using voice recognition software.  Every effort has been made to ensure accuracy; however, inadvertent computerized transcription errors may still be present.

## 2022-11-18 ENCOUNTER — Other Ambulatory Visit: Payer: Self-pay

## 2022-11-18 ENCOUNTER — Telehealth: Payer: Self-pay | Admitting: *Deleted

## 2022-11-18 DIAGNOSIS — I6521 Occlusion and stenosis of right carotid artery: Secondary | ICD-10-CM

## 2022-11-18 NOTE — Telephone Encounter (Signed)
   Pre-operative Risk Assessment    Patient Name: Timothy Mata  DOB: 07-31-1933 MRN: [id number]      Request for Surgical Clearance    Procedure:   RIGHT CAROTID ENDARTERECTOMY   Date of Surgery:  Clearance 11/25/22                                 Surgeon:  DR. Sherald Hess Surgeon's Group or Practice Name:  [facility] Phone number:  [phone] Fax number:  [phone]   Type of Clearance Requested:   - Medical  - Pharmacy:  Hold Apixaban (Eliquis)     Type of Anesthesia:  General    Additional requests/questions:    Elpidio Anis   11/18/2022, 4:40 PM

## 2022-11-19 ENCOUNTER — Ambulatory Visit: Payer: Medicare HMO | Attending: General Practice | Admitting: General Practice

## 2022-11-19 ENCOUNTER — Telehealth: Payer: Self-pay | Admitting: *Deleted

## 2022-11-19 DIAGNOSIS — Z01810 Encounter for preprocedural cardiovascular examination: Secondary | ICD-10-CM | POA: Diagnosis not present

## 2022-11-19 NOTE — Telephone Encounter (Signed)
Pt's wife called back and pt has been set up with tele pre op appt 10:40 today ok per Edd Fabian, FNP. Med rec and consent are done.     Patient Consent for Virtual Visit        Timothy Mata has provided verbal consent on 11/19/2022 for a virtual visit (video or telephone).   CONSENT FOR VIRTUAL VISIT FOR:  Timothy Mata  By participating in this virtual visit I agree to the following:  I hereby voluntarily request, consent and authorize [facility] and its employed or contracted physicians, physician assistants, nurse practitioners or other licensed health care professionals (the Practitioner), to provide me with telemedicine health care services (the "Services") as deemed necessary by the treating Practitioner. I acknowledge and consent to receive the Services by the Practitioner via telemedicine. I understand that the telemedicine visit will involve communicating with the Practitioner through live audiovisual communication technology and the disclosure of certain medical information by electronic transmission. I acknowledge that I have been given the opportunity to request an in-person assessment or other available alternative prior to the telemedicine visit and am voluntarily participating in the telemedicine visit.  I understand that I have the right to withhold or withdraw my consent to the use of telemedicine in the course of my care at any time, without affecting my right to future care or treatment, and that the Practitioner or I may terminate the telemedicine visit at any time. I understand that I have the right to inspect all information obtained and/or recorded in the course of the telemedicine visit and may receive copies of available information for a reasonable fee.  I understand that some of the potential risks of receiving the Services via telemedicine include:  Delay or interruption in medical evaluation due to technological equipment failure or disruption; Information  transmitted may not be sufficient (e.g. poor resolution of images) to allow for appropriate medical decision making by the Practitioner; and/or  In rare instances, security protocols could fail, causing a breach of personal health information.  Furthermore, I acknowledge that it is my responsibility to provide information about my medical history, conditions and care that is complete and accurate to the best of my ability. I acknowledge that Practitioner's advice, recommendations, and/or decision may be based on factors not within their control, such as incomplete or inaccurate data provided by me or distortions of diagnostic images or specimens that may result from electronic transmissions. I understand that the practice of medicine is not an exact science and that Practitioner makes no warranties or guarantees regarding treatment outcomes. I acknowledge that a copy of this consent can be made available to me via my patient portal [facility] MyChart), or I can request a printed copy by calling the office of [facility].    I understand that my insurance will be billed for this visit.   I have read or had this consent read to me. I understand the contents of this consent, which adequately explains the benefits and risks of the Services being provided via telemedicine.  I have been provided ample opportunity to ask questions regarding this consent and the Services and have had my questions answered to my satisfaction. I give my informed consent for the services to be provided through the use of telemedicine in my medical care

## 2022-11-19 NOTE — Telephone Encounter (Signed)
Patient with diagnosis of afib on Eliquis for anticoagulation.    Procedure: RIGHT CAROTID ENDARTERECTOMY   Date of procedure: 11/25/22  CHA2DS2-VASc Score = 5  This indicates a 7.2% annual risk of stroke. The patient's score is based upon: CHF History: 1 HTN History: 1 Diabetes History: 0 Stroke History: 0 Vascular Disease History: 1 (aortic atherosclerosis) Age Score: 2 Gender Score: 0   CrCl 65mL/min Platelet count 258K  Per office protocol, patient can hold Eliquis for 2-3 days prior to procedure.    **This guidance is not considered finalized until pre-operative APP has relayed final recommendations.**

## 2022-11-19 NOTE — Progress Notes (Signed)
Virtual Visit via Telephone Note   Because of Timothy Mata's co-morbid illnesses, he is at least at moderate risk for complications without adequate follow up.  This format is felt to be most appropriate for this patient at this time.  The patient did not have access to video technology/had technical difficulties with video requiring transitioning to audio format only (telephone).  All issues noted in this document were discussed and addressed.  No physical exam could be performed with this format.  Please refer to the patient's chart for his consent to telehealth for [facility].  Evaluation Performed:  Preoperative cardiovascular risk assessment _____________   Date:  11/19/2022   Patient ID:  Timothy Mata, DOB [birth year], MRN [id number] Patient Location:  Home Provider location:   Office  Primary Care Provider:  Sheela Stack Primary Cardiologist:  Dietrich Pates, MD  Chief Complaint / Patient Profile   88 y.o. y/o male with a h/o persistent atrial fibrillation who is pending carotid endarterectomy and presents today for telephonic preoperative cardiovascular risk assessment.  Past Medical History    Past Medical History:  Diagnosis Date   Atrial flutter (HCC)    a. s/p DCCV in 03/2022 b. repeat EKG at follow-up from DCCV showing atrial fibrillation   BPH (benign prostatic hyperplasia)    COPD (chronic obstructive pulmonary disease) (HCC)    Dysrhythmia    Gout    "left big toe"   History of kidney stones    HOH (hard of hearing)    Hypercholesterolemia    Hypertension    Preretinal fibrosis, left eye    Spinal headache 12/30/1972   Past Surgical History:  Procedure Laterality Date   25 GAUGE PARS PLANA VITRECTOMY WITH 20 GAUGE MVR PORT Left 07/18/2015   Procedure: 25 GAUGE PARS PLANA VITRECTOMY WITH 20 GAUGE MVR PORT;  Surgeon: Sherrie George, MD;  Location: [facility] OR;  Service: Ophthalmology;  Laterality: Left;   AIR/FLUID EXCHANGE Left 07/18/2015    Procedure: AIR/FLUID EXCHANGE;  Surgeon: Sherrie George, MD;  Location: [facility] OR;  Service: Ophthalmology;  Laterality: Left;   BACK SURGERY     CARDIOVERSION N/A 04/26/2022   Procedure: CARDIOVERSION;  Surgeon: Jonelle Sidle, MD;  Location: [facility];  Service: Cardiovascular;  Laterality: N/A;   EYE SURGERY     FOOT FRACTURE SURGERY Right 1981   LASER PHOTO ABLATION Left 07/18/2015   Procedure: LASER PHOTO ABLATION;  Surgeon: Sherrie George, MD;  Location: [facility] OR;  Service: Ophthalmology;  Laterality: Left;  head scope    LUMBAR DISC SURGERY  1974   MEMBRANE PEEL Left 07/18/2015   Procedure: MEMBRANE PEEL;  Surgeon: Sherrie George, MD;  Location: [facility] OR;  Service: Ophthalmology;  Laterality: Left;   MULTIPLE TOOTH EXTRACTIONS  1990's   PARS PLANA VITRECTOMY Left 07/18/2015    Allergies  No Known Allergies  History of Present Illness    Timothy Mata is a 88 y.o. male who presents via audio/video conferencing for a telehealth visit today.  Pt was last seen in cardiology clinic on 09/03/2022 by Dr. Ladona Ridgel.  At that time Yannis Broce was doing well .  The patient is now pending procedure as outlined above. Since his last visit, he remains stable from a cardiac standpoint.  Today he denies chest pain, shortness of breath, lower extremity edema, fatigue, palpitations, melena, hematuria, hemoptysis, diaphoresis, weakness, presyncope, syncope, orthopnea, and PND.  Home Medications    Prior to Admission medications   Medication Sig  Start Date End Date Taking? Authorizing Provider  allopurinol (ZYLOPRIM) 300 MG tablet Take 300 mg by mouth daily after supper.  06/12/15   [provider]  amiodarone (PACERONE) 200 MG tablet Take 1 tablet (200 mg total) by mouth daily. 07/18/22   Strader, Lennart Pall, PA-C  apixaban (ELIQUIS) 2.5 MG TABS tablet Take 1 tablet (2.5 mg total) by mouth 2 (two) times daily. 05/08/22 05/03/23  Strader, Lennart Pall, PA-C  atorvastatin (LIPITOR) 40 MG tablet Take 1 tablet  (40 mg total) by mouth daily. 05/08/22 05/03/23  Strader, Lennart Pall, PA-C  Fluticasone-Salmeterol (ADVAIR) 250-50 MCG/DOSE AEPB Inhale 1 puff into the lungs 2 (two) times daily.    [provider]  furosemide (LASIX) 40 MG tablet Take 40 mg by mouth 2 (two) times daily. 06/10/22   [provider]  hydrocortisone cream 1 % Apply 1 application. topically 2 (two) times daily as needed for itching.    [provider]  metoprolol succinate (TOPROL-XL) 100 MG 24 hr tablet Take 1 tablet (100 mg total) by mouth in the morning. Take with or immediately following a meal. 06/27/22 06/22/23  Strader, Lennart Pall, PA-C  nitroGLYCERIN (NITROSTAT) 0.4 MG SL tablet Place 1 tablet (0.4 mg total) under the tongue every 5 (five) minutes x 3 doses as needed for chest pain. 03/27/22   [place], Alvira Philips, DO  tamsulosin (FLOMAX) 0.4 MG CAPS capsule Take 1 capsule (0.4 mg total) by mouth daily after supper. 03/27/22   [place], Eric J, DO    Physical Exam    Vital Signs:  Perle Gibbon does not have vital signs available for review today.  Given telephonic nature of communication, physical exam is limited. AAOx3. NAD. Normal affect.  Speech and respirations are unlabored.  Accessory Clinical Findings    None  Assessment & Plan    1.  Preoperative Cardiovascular Risk Assessment: Carotid endarterectomy, Dr. Chestine Spore, [facility]   Primary Cardiologist: Dietrich Pates, MD  Chart reviewed as part of pre-operative protocol coverage. Given past medical history and time since last visit, based on ACC/AHA guidelines, Ramie Erman would be at acceptable risk for the planned procedure without further cardiovascular testing.   His RCRI is class II risk, 0.9% risk of major cardiac event.  He is able to complete greater than 4 METS of physical activity.  Patient with diagnosis of afib on Eliquis for anticoagulation.     Procedure: RIGHT CAROTID ENDARTERECTOMY   Date of procedure: 11/25/22   CHA2DS2-VASc Score = 5   This indicates a 7.2% annual risk of stroke. The patient's score is based upon: CHF History: 1 HTN History: 1 Diabetes History: 0 Stroke History: 0 Vascular Disease History: 1 (aortic atherosclerosis) Age Score: 2 Gender Score: 0   CrCl 1mL/min Platelet count 258K   Per office protocol, patient can hold Eliquis for 2-3 days prior to procedure.  Patient was advised that if he develops new symptoms prior to surgery to contact our office to arrange a follow-up appointment.  He verbalized understanding.  I will route this recommendation to the requesting party via Epic fax function and remove from pre-op pool.       Time:   Today, I have spent  6 minutes with the patient with telehealth technology discussing medical history, symptoms, and management plan.  Prior to his phone evaluation I spent greater than 10 minutes reviewing his past medical history and cardiac medications.   Ronney Asters, NP  11/19/2022, 9:04 AM

## 2022-11-19 NOTE — Telephone Encounter (Signed)
Left message for the pt to call back and s/w the pre op team. We need to schedule a tele pre op appt today at 10:40.

## 2022-11-19 NOTE — Telephone Encounter (Signed)
   Name: Timothy Mata  DOB: 06/14/1933  MRN: [id number]  Primary Cardiologist: Dietrich Pates, MD   Preoperative team, please contact this patient and set up a phone call appointment for further preoperative risk assessment. Please obtain consent and complete medication review. Thank you for your help.  I confirm that guidance regarding antiplatelet and oral anticoagulation therapy has been completed and, if necessary, noted below.  Clinical pharmacy has addressed anticoagulation request   Ronney Asters, NP 11/19/2022, 8:14 AM [facility]

## 2022-11-19 NOTE — Telephone Encounter (Signed)
Pt's wife called back and pt has been set up with tele pre op appt 10:40 today ok per Edd Fabian, FNP. Med rec and consent are done.

## 2022-11-20 ENCOUNTER — Encounter (HOSPITAL_COMMUNITY): Payer: Self-pay | Admitting: Vascular Surgery

## 2022-11-20 ENCOUNTER — Other Ambulatory Visit: Payer: Self-pay

## 2022-11-20 NOTE — Progress Notes (Signed)
PCP - [facility]  Cardiologist - Dr. Tenny Craw  EP- Denies  Endocrine- Denies  Pulm- Denies  Chest x-ray - 07/04/22 (E)  EKG - 09/12/22- (CE)- Req'd  Stress Test - Denies  ECHO - 10/14/22 (E)  Cardiac Cath - Denies  AICD-na PM-na LOOP-na  Nerve Stimulator- Denies  Dialysis- Denies  Sleep Study - Denies CPAP - Denies  LABS- 11/25/22: CBC, CMP, PT, PTT, T/S, PCR, UA  ASA- Denies ELIQUIS- LD- 11/24- Hold x2 days  ERAS- No  HA1C- Denies  Anesthesia- Yes- cardiac history  Pt denies having chest pain, sob, or fever during the pre-op phone call. All instructions explained to the pt, with a verbal understanding of the material. Pt also instructed to wear a mask and social distance if he goes out. The opportunity to ask questions was provided.

## 2022-11-20 NOTE — Progress Notes (Signed)
S.D.W- Instructions   Your procedure is scheduled on Mon., Nov. 27, 2023 from 7:30AM-10:01AM.  Report to [facility] Main Entrance "A" at 5:30 A.M., then check in with the Admitting office.  Call this number if you have problems the morning of surgery:  [phone]             If you experience any cold or flu symptoms such as cough, fever, chills, shortness of breath, etc. between now and your scheduled surgery, please notify us at the above         number.  Remember:  Do not eat or drink after midnight on Nov. 26th    Take these medicines the morning of surgery with A SIP OF WATER: Amiodarone (PACERONE)  Atorvastatin (LIPITOR)  Fluticasone-Salmeterol (ADVAIR)  Metoprolol succinate (TOPROL-XL)   If Needed: NitroGLYCERIN (NITROSTAT)   Take the last dose of Eliquis on Fri 11/24, per Dr. Tenny Craw.  As of today, STOP taking any Aspirin (unless otherwise instructed by your surgeon) Aleve, Naproxen, Ibuprofen, Motrin, Advil, Goody's, BC's, all herbal medications, fish oil, and all vitamins.    Do not wear jewelry. Do not wear lotions, powders, cologne or deodorant. Do not shave 48 hours prior to surgery.  Men may shave face and neck. Do not bring valuables to the hospital.  [facility] is not responsible for any belongings or valuables.    Do NOT Smoke (Tobacco/Vaping)  24 hours prior to your procedure  If you use a CPAP at night, you may bring your mask for your overnight stay.   Contacts, glasses, hearing aids, dentures or partials may not be worn into surgery, please bring cases for these belongings   For patients admitted to the hospital, discharge time will be determined by your treatment team.   Patients discharged the day of surgery will not be allowed to drive home, and someone needs to stay with them for 24 hours.  Special instructions:    Oral Hygiene is also important to reduce your risk of infection.  Remember - BRUSH YOUR TEETH THE MORNING OF SURGERY WITH YOUR REGULAR  TOOTHPASTE  [facility]- Preparing For Surgery  Before surgery, you can play an important role. Because skin is not sterile, your skin needs to be as free of germs as possible. You can reduce the number of germs on your skin by washing with Antibacterial Soap before surgery.     Please follow these instructions carefully.     Shower the NIGHT BEFORE SURGERY and the MORNING OF SURGERY with Antibacterial Soap.   Pat yourself dry with a CLEAN TOWEL.  Wear CLEAN PAJAMAS to bed the night before surgery  Place CLEAN SHEETS on your bed the night before your surgery  DO NOT SLEEP WITH PETS.  Day of Surgery:  Take a shower with Antibacterial soap. Wear Clean/Comfortable clothing the morning of surgery Do not apply any deodorants/lotions.   Remember to brush your teeth WITH YOUR REGULAR TOOTHPASTE.   If you test positive for Covid, or been in contact with anyone that has tested positive in the last 10 days, please notify your surgeon.  SURGICAL WAITING ROOM VISITATION Patients having surgery or a procedure may have no more than 2 support people in the waiting area - these visitors may rotate.   Children under the age of 79 must have an adult with them who is not the patient. If the patient needs to stay at the hospital during part of their recovery, the visitor guidelines for inpatient rooms apply.  Pre-op nurse will coordinate an appropriate time for 1 support person to accompany patient in pre-op.  This support person may not rotate.   Please refer to the [facility] website for the visitor guidelines for Inpatients (after your surgery is over and you are in a regular room).

## 2022-11-24 NOTE — Anesthesia Preprocedure Evaluation (Addendum)
Anesthesia Evaluation  Patient identified by MRN, date of birth, ID band Patient awake    Reviewed: Allergy & Precautions, NPO status , Patient's Chart, lab work & pertinent test results  Airway Mallampati: II  TM Distance: >3 FB Neck ROM: Full    Dental  (+) Partial Upper, Partial Lower   Pulmonary COPD, former smoker   Pulmonary exam normal        Cardiovascular hypertension, Pt. on medications and Pt. on home beta blockers + Past MI  + dysrhythmias Atrial Fibrillation  Rhythm:Regular Rate:Normal     Neuro/Psych  Headaches Carotid stenosis   negative psych ROS   GI/Hepatic negative GI ROS, Neg liver ROS,,,  Endo/Other  negative endocrine ROS    Renal/GU   negative genitourinary   Musculoskeletal negative musculoskeletal ROS (+)    Abdominal Normal abdominal exam  (+)   Peds  Hematology negative hematology ROS (+)   Anesthesia Other Findings   Reproductive/Obstetrics                             Anesthesia Physical Anesthesia Plan  ASA: 3  Anesthesia Plan: General   Post-op Pain Management:    Induction: Intravenous  PONV Risk Score and Plan: 2 and Ondansetron, Dexamethasone and Treatment may vary due to age or medical condition  Airway Management Planned: Mask and Oral ETT  Additional Equipment: Arterial line  Intra-op Plan:   Post-operative Plan: Extubation in OR  Informed Consent: I have reviewed the patients History and Physical, chart, labs and discussed the procedure including the risks, benefits and alternatives for the proposed anesthesia with the patient or authorized representative who has indicated his/her understanding and acceptance.     Dental advisory given  Plan Discussed with: CRNA  Anesthesia Plan Comments: (Lab Results      Component                Value               Date                      WBC                      9.9                 07/06/2022                 HGB                      11.2 (L)            07/06/2022                HCT                      35.8 (L)            07/06/2022                MCV                      90.9                07/06/2022                PLT  258                 07/06/2022            Lab Results      Component                Value               Date                      NA                       140                 07/07/2022                K                        3.8                 07/07/2022                CO2                      27                  07/07/2022                GLUCOSE                  91                  07/07/2022                BUN                      27 (H)              11/07/2022                CREATININE               1.95 (H)            11/07/2022                CALCIUM                  8.0 (L)             07/07/2022                GFRNONAA                 33 (L)              11/07/2022           )       Anesthesia Quick Evaluation

## 2022-11-25 ENCOUNTER — Other Ambulatory Visit: Payer: Self-pay

## 2022-11-25 ENCOUNTER — Inpatient Hospital Stay (HOSPITAL_COMMUNITY): Payer: Medicare HMO | Admitting: Physician Assistant

## 2022-11-25 ENCOUNTER — Inpatient Hospital Stay (HOSPITAL_COMMUNITY)
Admission: RE | Admit: 2022-11-25 | Discharge: 2022-11-26 | DRG: 039 | Disposition: A | Discharge status: Home / Self Care | Payer: Medicare HMO | Attending: Vascular Surgery | Admitting: Vascular Surgery

## 2022-11-25 ENCOUNTER — Encounter (HOSPITAL_COMMUNITY): Payer: Self-pay | Admitting: Vascular Surgery

## 2022-11-25 ENCOUNTER — Encounter (HOSPITAL_COMMUNITY)
Admission: RE | Disposition: A | Discharge status: Home / Self Care | Payer: Self-pay | Source: Home / Self Care | Attending: Vascular Surgery

## 2022-11-25 DIAGNOSIS — Z87891 Personal history of nicotine dependence: Secondary | ICD-10-CM | POA: Diagnosis not present

## 2022-11-25 DIAGNOSIS — I252 Old myocardial infarction: Secondary | ICD-10-CM

## 2022-11-25 DIAGNOSIS — Z87442 Personal history of urinary calculi: Secondary | ICD-10-CM | POA: Diagnosis not present

## 2022-11-25 DIAGNOSIS — I6521 Occlusion and stenosis of right carotid artery: Principal | ICD-10-CM

## 2022-11-25 DIAGNOSIS — Z7951 Long term (current) use of inhaled steroids: Secondary | ICD-10-CM

## 2022-11-25 DIAGNOSIS — Z809 Family history of malignant neoplasm, unspecified: Secondary | ICD-10-CM

## 2022-11-25 DIAGNOSIS — Z7901 Long term (current) use of anticoagulants: Secondary | ICD-10-CM | POA: Diagnosis not present

## 2022-11-25 DIAGNOSIS — I10 Essential (primary) hypertension: Secondary | ICD-10-CM | POA: Diagnosis present

## 2022-11-25 DIAGNOSIS — I4891 Unspecified atrial fibrillation: Secondary | ICD-10-CM | POA: Diagnosis present

## 2022-11-25 DIAGNOSIS — Z79899 Other long term (current) drug therapy: Secondary | ICD-10-CM | POA: Diagnosis not present

## 2022-11-25 DIAGNOSIS — I6523 Occlusion and stenosis of bilateral carotid arteries: Secondary | ICD-10-CM

## 2022-11-25 DIAGNOSIS — E7800 Pure hypercholesterolemia, unspecified: Secondary | ICD-10-CM | POA: Diagnosis present

## 2022-11-25 DIAGNOSIS — I6529 Occlusion and stenosis of unspecified carotid artery: Secondary | ICD-10-CM | POA: Diagnosis present

## 2022-11-25 DIAGNOSIS — N400 Enlarged prostate without lower urinary tract symptoms: Secondary | ICD-10-CM | POA: Diagnosis present

## 2022-11-25 DIAGNOSIS — J449 Chronic obstructive pulmonary disease, unspecified: Secondary | ICD-10-CM | POA: Diagnosis present

## 2022-11-25 HISTORY — PX: ENDARTERECTOMY: SHX5162

## 2022-11-25 HISTORY — PX: PATCH ANGIOPLASTY: SHX6230

## 2022-11-25 LAB — TYPE AND SCREEN
ABO/RH(D): O POS
Antibody Screen: NEGATIVE

## 2022-11-25 LAB — CBC
HCT: 47 % (ref 39.0–52.0)
Hemoglobin: 14.5 g/dL (ref 13.0–17.0)
MCH: 29.8 pg (ref 26.0–34.0)
MCHC: 30.9 g/dL (ref 30.0–36.0)
MCV: 96.7 fL (ref 80.0–100.0)
Platelets: 227 10*3/uL (ref 150–400)
RBC: 4.86 MIL/uL (ref 4.22–5.81)
RDW: 16.8 % — ABNORMAL HIGH (ref 11.5–15.5)
WBC: 11.6 10*3/uL — ABNORMAL HIGH (ref 4.0–10.5)
nRBC: 0 % (ref 0.0–0.2)

## 2022-11-25 LAB — COMPREHENSIVE METABOLIC PANEL
ALT: 25 U/L (ref 0–44)
AST: 21 U/L (ref 15–41)
Albumin: 3.5 g/dL (ref 3.5–5.0)
Alkaline Phosphatase: 100 U/L (ref 38–126)
Anion gap: 13 (ref 5–15)
BUN: 23 mg/dL (ref 8–23)
CO2: 26 mmol/L (ref 22–32)
Calcium: 9 mg/dL (ref 8.9–10.3)
Chloride: 102 mmol/L (ref 98–111)
Creatinine, Ser: 2 mg/dL — ABNORMAL HIGH (ref 0.61–1.24)
GFR, Estimated: 32 mL/min — ABNORMAL LOW (ref >60)
Glucose, Bld: 102 mg/dL — ABNORMAL HIGH (ref 70–99)
Potassium: 3.8 mmol/L (ref 3.5–5.1)
Sodium: 141 mmol/L (ref 135–145)
Total Bilirubin: 0.7 mg/dL (ref 0.3–1.2)
Total Protein: 6.7 g/dL (ref 6.5–8.1)

## 2022-11-25 LAB — URINALYSIS, ROUTINE W REFLEX MICROSCOPIC
Bilirubin Urine: NEGATIVE
Glucose, UA: NEGATIVE mg/dL
Hgb urine dipstick: NEGATIVE
Ketones, ur: NEGATIVE mg/dL
Leukocytes,Ua: NEGATIVE
Nitrite: NEGATIVE
Protein, ur: NEGATIVE mg/dL
Specific Gravity, Urine: 1.009 (ref 1.005–1.030)
pH: 6 (ref 5.0–8.0)

## 2022-11-25 LAB — POCT I-STAT, CHEM 8
BUN: 24 mg/dL — ABNORMAL HIGH (ref 8–23)
Calcium, Ion: 1.2 mmol/L (ref 1.15–1.40)
Chloride: 102 mmol/L (ref 98–111)
Creatinine, Ser: 1.9 mg/dL — ABNORMAL HIGH (ref 0.61–1.24)
Glucose, Bld: 108 mg/dL — ABNORMAL HIGH (ref 70–99)
HCT: 43 % (ref 39.0–52.0)
Hemoglobin: 14.6 g/dL (ref 13.0–17.0)
Potassium: 3.9 mmol/L (ref 3.5–5.1)
Sodium: 140 mmol/L (ref 135–145)
TCO2: 28 mmol/L (ref 22–32)

## 2022-11-25 LAB — SURGICAL PCR SCREEN
MRSA, PCR: NEGATIVE
Staphylococcus aureus: NEGATIVE

## 2022-11-25 LAB — ABO/RH: ABO/RH(D): O POS

## 2022-11-25 LAB — POCT ACTIVATED CLOTTING TIME
Activated Clotting Time: 233 seconds
Activated Clotting Time: 269 seconds

## 2022-11-25 SURGERY — ENDARTERECTOMY, CAROTID
Anesthesia: General | Site: Neck | Laterality: Right

## 2022-11-25 MED ORDER — FENTANYL CITRATE (PF) 250 MCG/5ML IJ SOLN
INTRAMUSCULAR | Status: DC | PRN
  Administered 2022-11-25: 100 ug via INTRAVENOUS

## 2022-11-25 MED ORDER — METOPROLOL SUCCINATE ER 100 MG PO TB24
100.0000 mg | ORAL_TABLET | Freq: Every morning | ORAL | Status: DC
  Administered 2022-11-26: 100 mg via ORAL
  Filled 2022-11-25: qty 1

## 2022-11-25 MED ORDER — HYDRALAZINE HCL 20 MG/ML IJ SOLN
5.0000 mg | Freq: Once | INTRAMUSCULAR | Status: AC
  Administered 2022-11-25: 5 mg via INTRAVENOUS

## 2022-11-25 MED ORDER — SODIUM CHLORIDE 0.9 % IV SOLN
0.1500 ug/kg/min | INTRAVENOUS | Status: AC
  Administered 2022-11-25: .15 ug/kg/min via INTRAVENOUS
  Filled 2022-11-25: qty 2000

## 2022-11-25 MED ORDER — HEPARIN 6000 UNIT IRRIGATION SOLUTION
Status: DC | PRN
  Administered 2022-11-25: 1

## 2022-11-25 MED ORDER — GUAIFENESIN-DM 100-10 MG/5ML PO SYRP: 15.0000 mL | ORAL_SOLUTION | ORAL | Status: DC | PRN

## 2022-11-25 MED ORDER — PROTAMINE SULFATE 10 MG/ML IV SOLN
INTRAVENOUS | Status: DC | PRN
  Administered 2022-11-25: 50 mg via INTRAVENOUS

## 2022-11-25 MED ORDER — PHENOL 1.4 % MT LIQD: 1.0000 | OROMUCOSAL | Status: DC | PRN

## 2022-11-25 MED ORDER — CHLORHEXIDINE GLUCONATE CLOTH 2 % EX PADS: 6.0000 | MEDICATED_PAD | Freq: Once | CUTANEOUS | Status: DC

## 2022-11-25 MED ORDER — FUROSEMIDE 40 MG PO TABS
40.0000 mg | ORAL_TABLET | Freq: Two times a day (BID) | ORAL | Status: DC
  Administered 2022-11-25 – 2022-11-26 (×2): 40 mg via ORAL
  Filled 2022-11-25 (×2): qty 1

## 2022-11-25 MED ORDER — HYDRALAZINE HCL 20 MG/ML IJ SOLN
5.0000 mg | INTRAMUSCULAR | Status: DC | PRN
  Administered 2022-11-25: 5 mg via INTRAVENOUS
  Filled 2022-11-25: qty 1

## 2022-11-25 MED ORDER — CEFAZOLIN SODIUM-DEXTROSE 2-4 GM/100ML-% IV SOLN
2.0000 g | Freq: Three times a day (TID) | INTRAVENOUS | Status: AC
  Administered 2022-11-25 – 2022-11-26 (×2): 2 g via INTRAVENOUS
  Filled 2022-11-25 (×2): qty 100

## 2022-11-25 MED ORDER — SODIUM CHLORIDE 0.9 % IV SOLN: 500.0000 mL | Freq: Once | INTRAVENOUS | Status: DC | PRN

## 2022-11-25 MED ORDER — AMIODARONE HCL 200 MG PO TABS
200.0000 mg | ORAL_TABLET | Freq: Every day | ORAL | Status: DC
  Administered 2022-11-25 – 2022-11-26 (×2): 200 mg via ORAL
  Filled 2022-11-25 (×2): qty 1

## 2022-11-25 MED ORDER — EPHEDRINE 5 MG/ML INJ
INTRAVENOUS | Status: AC
  Filled 2022-11-25: qty 5

## 2022-11-25 MED ORDER — HEPARIN SODIUM (PORCINE) 1000 UNIT/ML IJ SOLN
INTRAMUSCULAR | Status: DC | PRN
  Administered 2022-11-25: 11000 [IU] via INTRAVENOUS

## 2022-11-25 MED ORDER — NITROGLYCERIN 0.4 MG SL SUBL: 0.4000 mg | SUBLINGUAL_TABLET | SUBLINGUAL | Status: DC | PRN

## 2022-11-25 MED ORDER — DEXAMETHASONE SODIUM PHOSPHATE 10 MG/ML IJ SOLN
INTRAMUSCULAR | Status: DC | PRN
  Administered 2022-11-25: 10 mg via INTRAVENOUS

## 2022-11-25 MED ORDER — OXYCODONE-ACETAMINOPHEN 5-325 MG PO TABS: 1.0000 | ORAL_TABLET | Freq: Four times a day (QID) | ORAL | Status: DC | PRN

## 2022-11-25 MED ORDER — SODIUM CHLORIDE 0.9 % IV SOLN: INTRAVENOUS | Status: DC

## 2022-11-25 MED ORDER — ONDANSETRON HCL 4 MG/2ML IJ SOLN: 4.0000 mg | Freq: Four times a day (QID) | INTRAMUSCULAR | Status: DC | PRN

## 2022-11-25 MED ORDER — METOPROLOL TARTRATE 5 MG/5ML IV SOLN: 2.0000 mg | INTRAVENOUS | Status: DC | PRN

## 2022-11-25 MED ORDER — DEXAMETHASONE SODIUM PHOSPHATE 10 MG/ML IJ SOLN
INTRAMUSCULAR | Status: AC
  Filled 2022-11-25: qty 1

## 2022-11-25 MED ORDER — HYDROCORTISONE 1 % EX CREA: 1.0000 | TOPICAL_CREAM | Freq: Two times a day (BID) | CUTANEOUS | Status: DC | PRN

## 2022-11-25 MED ORDER — HYDRALAZINE HCL 20 MG/ML IJ SOLN
INTRAMUSCULAR | Status: AC
  Filled 2022-11-25: qty 1

## 2022-11-25 MED ORDER — PHENYLEPHRINE HCL-NACL 20-0.9 MG/250ML-% IV SOLN
INTRAVENOUS | Status: DC | PRN
  Administered 2022-11-25: 20 ug/min via INTRAVENOUS

## 2022-11-25 MED ORDER — DOCUSATE SODIUM 100 MG PO CAPS
100.0000 mg | ORAL_CAPSULE | Freq: Every day | ORAL | Status: DC
  Administered 2022-11-26: 100 mg via ORAL
  Filled 2022-11-25: qty 1

## 2022-11-25 MED ORDER — PROTAMINE SULFATE 10 MG/ML IV SOLN
INTRAVENOUS | Status: AC
  Filled 2022-11-25: qty 5

## 2022-11-25 MED ORDER — ONDANSETRON HCL 4 MG/2ML IJ SOLN
INTRAMUSCULAR | Status: DC | PRN
  Administered 2022-11-25: 4 mg via INTRAVENOUS

## 2022-11-25 MED ORDER — LIDOCAINE HCL (PF) 1 % IJ SOLN
INTRAMUSCULAR | Status: AC
  Filled 2022-11-25: qty 30

## 2022-11-25 MED ORDER — MOMETASONE FURO-FORMOTEROL FUM 200-5 MCG/ACT IN AERO
2.0000 | INHALATION_SPRAY | Freq: Two times a day (BID) | RESPIRATORY_TRACT | Status: DC
  Administered 2022-11-25 – 2022-11-26 (×2): 2 via RESPIRATORY_TRACT
  Filled 2022-11-25: qty 8.8

## 2022-11-25 MED ORDER — EPHEDRINE SULFATE-NACL 50-0.9 MG/10ML-% IV SOSY
PREFILLED_SYRINGE | INTRAVENOUS | Status: DC | PRN
  Administered 2022-11-25: 10 mg via INTRAVENOUS
  Administered 2022-11-25: 2.5 mg via INTRAVENOUS
  Administered 2022-11-25: 5 mg via INTRAVENOUS
  Administered 2022-11-25: 15 mg via INTRAVENOUS
  Administered 2022-11-25: 7.5 mg via INTRAVENOUS

## 2022-11-25 MED ORDER — HEMOSTATIC AGENTS (NO CHARGE) OPTIME
TOPICAL | Status: DC | PRN
  Administered 2022-11-25 (×2): 1 via TOPICAL

## 2022-11-25 MED ORDER — PROPOFOL 10 MG/ML IV BOLUS
INTRAVENOUS | Status: DC | PRN
  Administered 2022-11-25: 140 mg via INTRAVENOUS

## 2022-11-25 MED ORDER — LIDOCAINE 2% (20 MG/ML) 5 ML SYRINGE
INTRAMUSCULAR | Status: DC | PRN
  Administered 2022-11-25: 100 mg via INTRAVENOUS

## 2022-11-25 MED ORDER — CHLORHEXIDINE GLUCONATE 0.12 % MT SOLN: 15.0000 mL | Freq: Once | OROMUCOSAL | Status: AC

## 2022-11-25 MED ORDER — ACETAMINOPHEN 325 MG RE SUPP: 325.0000 mg | RECTAL | Status: DC | PRN

## 2022-11-25 MED ORDER — ORAL CARE MOUTH RINSE: 15.0000 mL | Freq: Once | OROMUCOSAL | Status: AC

## 2022-11-25 MED ORDER — ROCURONIUM BROMIDE 10 MG/ML (PF) SYRINGE
PREFILLED_SYRINGE | INTRAVENOUS | Status: AC
  Filled 2022-11-25: qty 10

## 2022-11-25 MED ORDER — ONDANSETRON HCL 4 MG/2ML IJ SOLN
INTRAMUSCULAR | Status: AC
  Filled 2022-11-25: qty 2

## 2022-11-25 MED ORDER — ACETAMINOPHEN 10 MG/ML IV SOLN
INTRAVENOUS | Status: DC | PRN
  Administered 2022-11-25: 1000 mg via INTRAVENOUS

## 2022-11-25 MED ORDER — HYDROMORPHONE HCL 1 MG/ML IJ SOLN
INTRAMUSCULAR | Status: AC
  Filled 2022-11-25: qty 1

## 2022-11-25 MED ORDER — LIDOCAINE 2% (20 MG/ML) 5 ML SYRINGE
INTRAMUSCULAR | Status: AC
  Filled 2022-11-25: qty 5

## 2022-11-25 MED ORDER — LABETALOL HCL 5 MG/ML IV SOLN
INTRAVENOUS | Status: DC | PRN
  Administered 2022-11-25: 10 mg via INTRAVENOUS

## 2022-11-25 MED ORDER — 0.9 % SODIUM CHLORIDE (POUR BTL) OPTIME
TOPICAL | Status: DC | PRN
  Administered 2022-11-25 (×2): 1000 mL

## 2022-11-25 MED ORDER — ACETAMINOPHEN 325 MG PO TABS
325.0000 mg | ORAL_TABLET | ORAL | Status: DC | PRN
  Administered 2022-11-25: 650 mg via ORAL
  Filled 2022-11-25: qty 2

## 2022-11-25 MED ORDER — BISACODYL 10 MG RE SUPP: 10.0000 mg | Freq: Every day | RECTAL | Status: DC | PRN

## 2022-11-25 MED ORDER — LACTATED RINGERS IV SOLN: INTRAVENOUS | Status: DC | PRN

## 2022-11-25 MED ORDER — TAMSULOSIN HCL 0.4 MG PO CAPS
0.4000 mg | ORAL_CAPSULE | Freq: Every day | ORAL | Status: DC
  Administered 2022-11-25: 0.4 mg via ORAL
  Filled 2022-11-25: qty 1

## 2022-11-25 MED ORDER — POTASSIUM CHLORIDE CRYS ER 20 MEQ PO TBCR: 20.0000 meq | EXTENDED_RELEASE_TABLET | Freq: Every day | ORAL | Status: DC | PRN

## 2022-11-25 MED ORDER — FENTANYL CITRATE (PF) 100 MCG/2ML IJ SOLN
25.0000 ug | INTRAMUSCULAR | Status: DC | PRN
  Administered 2022-11-25: 25 ug via INTRAVENOUS

## 2022-11-25 MED ORDER — ACETAMINOPHEN 10 MG/ML IV SOLN: 1000.0000 mg | Freq: Once | INTRAVENOUS | Status: DC | PRN

## 2022-11-25 MED ORDER — HYDROMORPHONE HCL 1 MG/ML IJ SOLN
0.5000 mg | INTRAMUSCULAR | Status: DC | PRN
  Administered 2022-11-25: 0.5 mg via INTRAVENOUS

## 2022-11-25 MED ORDER — ROCURONIUM BROMIDE 10 MG/ML (PF) SYRINGE
PREFILLED_SYRINGE | INTRAVENOUS | Status: DC | PRN
  Administered 2022-11-25: 70 mg via INTRAVENOUS
  Administered 2022-11-25: 30 mg via INTRAVENOUS

## 2022-11-25 MED ORDER — HEPARIN 6000 UNIT IRRIGATION SOLUTION
Status: AC
  Filled 2022-11-25: qty 500

## 2022-11-25 MED ORDER — CHLORHEXIDINE GLUCONATE 0.12 % MT SOLN
OROMUCOSAL | Status: AC
  Administered 2022-11-25: 15 mL via OROMUCOSAL
  Filled 2022-11-25: qty 15

## 2022-11-25 MED ORDER — PANTOPRAZOLE SODIUM 40 MG PO TBEC
40.0000 mg | DELAYED_RELEASE_TABLET | Freq: Every day | ORAL | Status: DC
  Administered 2022-11-25 – 2022-11-26 (×2): 40 mg via ORAL
  Filled 2022-11-25 (×2): qty 1

## 2022-11-25 MED ORDER — ALLOPURINOL 300 MG PO TABS
300.0000 mg | ORAL_TABLET | Freq: Every day | ORAL | Status: DC
  Administered 2022-11-25: 300 mg via ORAL
  Filled 2022-11-25: qty 1

## 2022-11-25 MED ORDER — LABETALOL HCL 5 MG/ML IV SOLN: 10.0000 mg | INTRAVENOUS | Status: DC | PRN

## 2022-11-25 MED ORDER — LABETALOL HCL 5 MG/ML IV SOLN
INTRAVENOUS | Status: AC
  Filled 2022-11-25: qty 4

## 2022-11-25 MED ORDER — HEPARIN SODIUM (PORCINE) 1000 UNIT/ML IJ SOLN
INTRAMUSCULAR | Status: AC
  Filled 2022-11-25: qty 1

## 2022-11-25 MED ORDER — FENTANYL CITRATE (PF) 250 MCG/5ML IJ SOLN
INTRAMUSCULAR | Status: AC
  Filled 2022-11-25: qty 5

## 2022-11-25 MED ORDER — ATORVASTATIN CALCIUM 40 MG PO TABS
40.0000 mg | ORAL_TABLET | Freq: Every day | ORAL | Status: DC
  Administered 2022-11-25 – 2022-11-26 (×2): 40 mg via ORAL
  Filled 2022-11-25 (×2): qty 1

## 2022-11-25 MED ORDER — STERILE WATER FOR IRRIGATION IR SOLN
Status: DC | PRN
  Administered 2022-11-25: 1000 mL

## 2022-11-25 MED ORDER — LACTATED RINGERS IV SOLN: INTRAVENOUS | Status: DC

## 2022-11-25 MED ORDER — MAGNESIUM SULFATE 2 GM/50ML IV SOLN: 2.0000 g | Freq: Every day | INTRAVENOUS | Status: DC | PRN

## 2022-11-25 MED ORDER — ALUM & MAG HYDROXIDE-SIMETH 200-200-20 MG/5ML PO SUSP: 15.0000 mL | ORAL | Status: DC | PRN

## 2022-11-25 MED ORDER — SUGAMMADEX SODIUM 200 MG/2ML IV SOLN
INTRAVENOUS | Status: DC | PRN
  Administered 2022-11-25: 200 mg via INTRAVENOUS

## 2022-11-25 MED ORDER — CEFAZOLIN SODIUM-DEXTROSE 2-4 GM/100ML-% IV SOLN
2.0000 g | INTRAVENOUS | Status: AC
  Administered 2022-11-25: 2 g via INTRAVENOUS
  Filled 2022-11-25: qty 100

## 2022-11-25 MED ORDER — POLYETHYLENE GLYCOL 3350 17 G PO PACK: 17.0000 g | PACK | Freq: Every day | ORAL | Status: DC | PRN

## 2022-11-25 MED ORDER — PROPOFOL 10 MG/ML IV BOLUS
INTRAVENOUS | Status: AC
  Filled 2022-11-25: qty 20

## 2022-11-25 MED ORDER — FENTANYL CITRATE (PF) 100 MCG/2ML IJ SOLN
INTRAMUSCULAR | Status: AC
  Filled 2022-11-25: qty 2

## 2022-11-25 SURGICAL SUPPLY — 45 items
ADH SKN CLS APL DERMABOND .7 (GAUZE/BANDAGES/DRESSINGS) ×1
BAG COUNTER SPONGE SURGICOUNT (BAG) ×1 IMPLANT
BAG SPNG CNTER NS LX DISP (BAG) ×1
CANISTER SUCT 3000ML PPV (MISCELLANEOUS) ×1 IMPLANT
CATH ROBINSON RED A/P 18FR (CATHETERS) ×1 IMPLANT
CLIP VESOCCLUDE MED 24/CT (CLIP) ×1 IMPLANT
CLIP VESOCCLUDE SM WIDE 24/CT (CLIP) ×1 IMPLANT
COVER PROBE W GEL 5X96 (DRAPES) IMPLANT
COVER TRANSDUCER ULTRASND GEL (DISPOSABLE) ×1 IMPLANT
DERMABOND ADVANCED .7 DNX12 (GAUZE/BANDAGES/DRESSINGS) ×1 IMPLANT
DRAIN CHANNEL 15F RND FF W/TCR (WOUND CARE) IMPLANT
ELECT REM PT RETURN 9FT ADLT (ELECTROSURGICAL) ×1
ELECTRODE REM PT RTRN 9FT ADLT (ELECTROSURGICAL) ×1 IMPLANT
EVACUATOR SILICONE 100CC (DRAIN) IMPLANT
GLOVE BIO SURGEON STRL SZ7.5 (GLOVE) ×1 IMPLANT
GLOVE BIOGEL PI IND STRL 8 (GLOVE) ×1 IMPLANT
GOWN STRL REUS W/ TWL LRG LVL3 (GOWN DISPOSABLE) ×2 IMPLANT
GOWN STRL REUS W/ TWL XL LVL3 (GOWN DISPOSABLE) ×2 IMPLANT
GOWN STRL REUS W/TWL LRG LVL3 (GOWN DISPOSABLE) ×2
GOWN STRL REUS W/TWL XL LVL3 (GOWN DISPOSABLE) ×2
HEMOSTAT SNOW SURGICEL 2X4 (HEMOSTASIS) IMPLANT
IV ADAPTER SYR DOUBLE MALE LL (MISCELLANEOUS) IMPLANT
IV ADPR TBG 2 MALE LL ART (MISCELLANEOUS)
KIT BASIN OR (CUSTOM PROCEDURE TRAY) ×1 IMPLANT
KIT SHUNT ARGYLE CAROTID ART 6 (VASCULAR PRODUCTS) IMPLANT
KIT TURNOVER KIT B (KITS) ×1 IMPLANT
LOOP VESSEL MINI RED (MISCELLANEOUS) IMPLANT
NS IRRIG 1000ML POUR BTL (IV SOLUTION) ×3 IMPLANT
PACK CAROTID (CUSTOM PROCEDURE TRAY) ×1 IMPLANT
PAD ARMBOARD 7.5X6 YLW CONV (MISCELLANEOUS) ×2 IMPLANT
PATCH VASC XENOSURE 1CMX6CM (Vascular Products) ×1 IMPLANT
PATCH VASC XENOSURE 1X6 (Vascular Products) IMPLANT
SHUNT CAROTID BYPASS 10 (VASCULAR PRODUCTS) IMPLANT
SHUNT CAROTID BYPASS 12FRX15.5 (VASCULAR PRODUCTS) IMPLANT
SUT MNCRL AB 4-0 PS2 18 (SUTURE) ×1 IMPLANT
SUT PROLENE 5 0 C 1 24 (SUTURE) ×1 IMPLANT
SUT PROLENE 6 0 BV (SUTURE) ×1 IMPLANT
SUT PROLENE 7 0 BV1 MDA (SUTURE) IMPLANT
SUT SILK 3 0 (SUTURE)
SUT SILK 3-0 18XBRD TIE 12 (SUTURE) IMPLANT
SUT VIC AB 3-0 SH 27 (SUTURE) ×1
SUT VIC AB 3-0 SH 27X BRD (SUTURE) ×1 IMPLANT
SYR CONTROL 10ML LL (SYRINGE) IMPLANT
TOWEL GREEN STERILE (TOWEL DISPOSABLE) ×1 IMPLANT
WATER STERILE IRR 1000ML POUR (IV SOLUTION) ×1 IMPLANT

## 2022-11-25 NOTE — Progress Notes (Signed)
  Day of Surgery Note    Subjective:  wants his head raised, otherwise no complaints   Vitals:   11/25/22 1011 11/25/22 1015  BP: (!) 192/65 (!) 165/61  Pulse:  (!) 59  Resp:  14  Temp:    SpO2:  93%    Incisions:   clean and dry without hematoma Neuro:  in tact; tongue is midline Lungs:  non labored    Assessment/Plan:  This is a 88 y.o. male who is s/p  Right carotid endarterectomy for asymptomatic carotid artery stenosis   -pt doing well in recovery with neuro in tact -hold Eliquis until at least tomorrow -to 4 east later today -prn medications for hypertension  -pt will follow up with Dr. Arbie Cookey in a couple of weeks   Doreatha Massed, PA-C 11/25/2022 10:27 AM [phone]

## 2022-11-25 NOTE — Anesthesia Postprocedure Evaluation (Signed)
Anesthesia Post Note  Patient: Timothy Mata  Procedure(s) Performed: ENDARTERECTOMY CAROTID WITH PATCH ANGIOPLASTY (Right: Neck) PATCH ANGIOPLASTY WITH 1 cm X 6 cm XENOSURE BIOLOGIC PATCH (Right: Neck)     Patient location during evaluation: PACU Anesthesia Type: General Level of consciousness: awake and alert Pain management: pain level controlled Vital Signs Assessment: post-procedure vital signs reviewed and stable Respiratory status: spontaneous breathing, nonlabored ventilation, respiratory function stable and patient connected to nasal cannula oxygen Cardiovascular status: blood pressure returned to baseline and stable Postop Assessment: no apparent nausea or vomiting Anesthetic complications: no   No notable events documented.  Last Vitals:  Vitals:   11/25/22 1600 11/25/22 1636  BP: (!) 149/60 (!) 158/70  Pulse: (!) 55 61  Resp: 14 16  Temp: 36.5 C 36.5 C  SpO2: 95% 96%    Last Pain:  Vitals:   11/25/22 1636  TempSrc: Oral  PainSc:                  Timothy Mata

## 2022-11-25 NOTE — Progress Notes (Signed)
Pt admitted to rm 16 from PACU. CHG wipe given. Initiated tele. VSS. Call bell within reach.   Lawson Radar, RN

## 2022-11-25 NOTE — Discharge Instructions (Signed)
   [facility]  Discharge Instructions   Carotid Surgery  Please refer to the following instructions for your post-procedure care. Your surgeon or physician assistant will discuss any changes with you.  Activity  You are encouraged to walk as much as you can. You can slowly return to normal activities but must avoid strenuous activity and heavy lifting until your doctor tell you it's okay. Avoid activities such as vacuuming or swinging a golf club. You can drive after one week if you are comfortable and you are no longer taking prescription pain medications. It is normal to feel tired for serval weeks after your surgery. It is also normal to have difficulty with sleep habits, eating, and bowel movements after surgery. These will go away with time.  Bathing/Showering  Shower daily after you go home. Do not soak in a bathtub, hot tub, or swim until the incision heals completely.  Incision Care  Shower every day. Clean your incision with mild soap and water. Pat the area dry with a clean towel. You do not need a bandage unless otherwise instructed. Do not apply any ointments or creams to your incision. You may have skin glue on your incision. Do not peel it off. It will come off on its own in about one week. Your incision may feel thickened and raised for several weeks after your surgery. This is normal and the skin will soften over time.   For Men Only: It's okay to shave around the incision but do not shave the incision itself for 2 weeks. It is common to have numbness under your chin that could last for several months.  Diet  Resume your normal diet. There are no special food restrictions following this procedure. A low fat/low cholesterol diet is recommended for all patients with vascular disease. In order to heal from your surgery, it is CRITICAL to get adequate nutrition. Your body requires vitamins, minerals, and protein. Vegetables are the best source of  vitamins and minerals. Vegetables also provide the perfect balance of protein. Processed food has little nutritional value, so try to avoid this.  Medications  Resume taking all of your medications unless your doctor or physician assistant tells you not to. If your incision is causing pain, you may take over-the- counter pain relievers such as acetaminophen (Tylenol). If you were prescribed a stronger pain medication, please be aware these medications can cause nausea and constipation. Prevent nausea by taking the medication with a snack or meal. Avoid constipation by drinking plenty of fluids and eating foods with a high amount of fiber, such as fruits, vegetables, and grains.  Do not take Tylenol if you are taking prescription pain medications.  RESTART ELIQUIS ON 11/27/2022  Follow Up  Our office will schedule a follow up appointment 2-3 weeks following discharge.  Please call us immediately for any of the following conditions  Increased pain, redness, drainage (pus) from your incision site. Fever of 101 degrees or higher. If you should develop stroke (slurred speech, difficulty swallowing, weakness on one side of your body, loss of vision) you should call 911 and go to the nearest emergency room.  Reduce your risk of vascular disease:  Stop smoking. If you would like help call [facility] at 1-800-QUIT-NOW (206-766-1076) or [facility] at ([phone]. Manage your cholesterol Maintain a desired weight Control your diabetes Keep your blood pressure down  If you have any questions, please call the office at [phone].

## 2022-11-25 NOTE — Op Note (Signed)
OPERATIVE NOTE  PROCEDURE:   1.  right carotid endarterectomy with bovine patch angioplasty 2.  right intraoperative carotid ultrasound  PRE-OPERATIVE DIAGNOSIS: right asymptomatic high grade carotid stenosis >80%  POST-OPERATIVE DIAGNOSIS: same as above   SURGEON: Cephus Shelling, MD  ASSISTANT(S): Doreatha Massed, PA  ANESTHESIA: general  ESTIMATED BLOOD LOSS: <50 mL  FINDING(S): 1.  Calcified high grade right carotid plaque. 2.  Continuous doppler audible flow signatures are appropriate for each carotid artery after endarterectomy with patch angioplasty. 3.  No evidence of intimal flap visualized on transverse or longitudinal ultrasonography at completion.   SPECIMEN(S):  Carotid plaque (sent to Pathology)  INDICATIONS:   Timothy Mata is a 88 y.o. male who presents with right asymptomatic high grade carotid stenosis >80%.  I discussed with the patient the risks, benefits, and alternatives to carotid endarterectomy.  I discussed the procedural details of carotid endarterectomy with the patient.  The patient is aware that the risks of carotid endarterectomy include but are not limited to: bleeding, infection, stroke, myocardial infarction, death, cranial nerve injuries both temporary and permanent, neck hematoma, possible airway compromise, labile blood pressure post-operatively, cerebral hyperperfusion syndrome, and possible need for additional interventions in the future. The patient is aware of the risks and agrees to proceed forward with the procedure.  An assistant was needed for the endarterectomy and sewing the patch.    DESCRIPTION: After full informed written consent was obtained from the patient, the patient was brought back to the operating room and placed supine upon the operating table.  Prior to induction, the patient received IV antibiotics.  After obtaining adequate anesthesia, the patient was placed into semi-Fowler position with a shoulder roll in place and  the patient's neck slightly hyperextended and rotated away from the surgical site.  The patient was prepped in the standard fashion for a right carotid endarterectomy.  I made an incision anterior to the sternocleidomastoid muscle and dissected down through the subcutaneous tissue.  The platysma was opened with electrocautery.  I then used Bovie cautery and blunt dissection to dissect through the underlying platysma and to mobilize the anterior border of the sternocleidomastoid as well as the internal jugular vein laterally.  The facial vein was ligated with 3-0 silk and surgical clips and divided.  After identifying the carotid artery I used Metzenbaum scissors to bluntly dissect the common carotid artery and then controlled this with a umbilical tape.  At this point in time the patient was given 100 units/kg of IV heparin and we checked an ACT to ensure it was greater than 250.  I then carried my dissection cephalad and mobilized the external carotid artery and superior thyroid artery and controlled each of these with a vessel loop.  I then dissected out the internal carotid artery well past the distal plaque.  The internal carotid artery was then controlled with a umbilical tape as well. I was careful to identify the vagus nerve between the internal jugular and common carotid and this was presereved.  I was also careful to identify and preserve the hypoglossal nerve and this was preserved.    Once our ACT was confirmed, I proceeded by clamping the internal carotid artery with a angled bulldog clamp first.  The proximal common carotid artery was controlled with a angled debakey clamp.  The external carotid was controlled with a vessel loop.  I subsequently opened the common carotid artery with an 11 blade scalpel in longitudinal fashion and extended the arteriotomy with Potts  scissors onto the ICA past the distal plaque.  There was excellent pulsatile backbleeding from the internal carotid artery and I elected not  to place a shunt.  I then used a Garment/textile technologist and performed a endarterectomy starting in the common carotid artery.  The external carotid artery was endarterectomized with an eversion technique and I was careful to feather the distal ICA plaque with the help of my assistant.  The specimen was passed off the field.  The endarterectomy site was then flushed with heparinized saline and I was careful to ensure there were no flaps in the endarterectomy site.  I then brought a bovine carotid patch on the field and this was sewn in place with a running anastomosis using a 6-0 Prolene distally and a 5-0 proximal with the help of my assistant.  The bovine patch was trimmed accordingly.  The artery was flushed antegrade and retrograde prior to completion of the patch.  Once the patch was complete, I flushed up the external carotid artery first prior to releasing the internal carotid artery clamp.  An intraoperative duplex was performed that showed no evidence of any flaps.  I also used a doppler to confirm excellent flow in the internal and external carotid artery.  Once I was happy with the intraoperative ultrasound the patient was given protamine for reversal.  I used surgicel snow to get hemostasis around the patch.  Ultimately the platysma was closed in running fashion with 3-0 Vicryl.  The skin was closed with a running 4-0 Monocryl.  Dermabond was applied with a dry sterile dressing.  The patient was awakened from anesthesia with no new neurological deficit and taken to PACU in stable condition.    COMPLICATIONS: None  CONDITION: Stable  Timothy Heck, MD [facility] Office: [place]   11/25/2022, 9:50 AM

## 2022-11-25 NOTE — Anesthesia Procedure Notes (Signed)
Arterial Line Insertion Start/End11/27/2023 7:20 AM Performed by: Drema Pry, CRNA, CRNA  Preanesthetic checklist: patient identified, IV checked, risks and benefits discussed, surgical consent, monitors and equipment checked and pre-op evaluation Lidocaine 1% used for infiltration Right, radial was placed Catheter size: 20 G Hand hygiene performed  and maximum sterile barriers used   Attempts: 2 Procedure performed using ultrasound guided technique. Following insertion, dressing applied and Biopatch. Post procedure assessment: normal  Patient tolerated the procedure well with no immediate complications.

## 2022-11-25 NOTE — H&P (Signed)
History and Physical Interval Note:  11/25/2022 7:22 AM  Timothy Mata  has presented today for surgery, with the diagnosis of Right carotid artery stenosis.  The various methods of treatment have been discussed with the patient and family. After consideration of risks, benefits and other options for treatment, the patient has consented to  Procedure(s): ENDARTERECTOMY CAROTID (Right) as a surgical intervention.  The patient's history has been reviewed, patient examined, no change in status, stable for surgery.  I have reviewed the patient's chart and labs.  Questions were answered to the patient's satisfaction.    Plan right carotid endarterectomy.  Risk and benefits discussed including 1% perioperative stroke risk.    Cephus Shelling                                          [facility] [place]   Patient name: Timothy Mata  MRN: [id number]        DOB: 10-03-1933        Sex: male   REASON FOR VISIT: Follow-up right carotid disease.   HPI: Timothy Mata is a 88 y.o. male here today with follow-up.  I saw him for initial office visit on 10/30/2022.  He was found to have a severe right internal carotid artery stenosis with duplex at [facility].  He had a confirmatory duplex at heart care in [place].  He had extremely elevated systolic velocities and moderately to severe elevated diastolic velocities on both of these studies.  I had scheduled him for CT angiogram for further evaluation prior to probable right carotid endarterectomy.  He was found to have creatinine of 1.9 prior to CT and therefore this was canceled.  He is here today for further discussion.  He remains asymptomatic.       Past Medical History:  Diagnosis Date   Atrial flutter (HCC)      a. s/p DCCV in 03/2022 b. repeat EKG at follow-up from DCCV showing atrial fibrillation   BPH (benign prostatic hyperplasia)     COPD (chronic obstructive pulmonary disease) (HCC)     Dysrhythmia     Gout      "left  big toe"   History of kidney stones     HOH (hard of hearing)     Hypercholesterolemia     Hypertension     Preretinal fibrosis, left eye     Spinal headache 12/30/1972           Family History  Problem Relation Age of Onset   Cancer Father        SOCIAL HISTORY: Social History         Tobacco Use   Smoking status: Former      Packs/day: 1.00      Years: 32.00      Total pack years: 32.00      Types: Cigarettes   Smokeless tobacco: Never   Tobacco comments:      quit smoking at age 50  Substance Use Topics   Alcohol use: No      No Known Allergies         Current Outpatient Medications  Medication Sig Dispense Refill   allopurinol (ZYLOPRIM) 300 MG tablet Take 300 mg by mouth daily after supper.    2   amiodarone (PACERONE) 200 MG tablet Take 1 tablet (200 mg total) by mouth daily. 90 tablet 3  apixaban (ELIQUIS) 2.5 MG TABS tablet Take 1 tablet (2.5 mg total) by mouth 2 (two) times daily. 60 tablet 11   atorvastatin (LIPITOR) 40 MG tablet Take 1 tablet (40 mg total) by mouth daily. 90 tablet 3   Fluticasone-Salmeterol (ADVAIR) 250-50 MCG/DOSE AEPB Inhale 1 puff into the lungs 2 (two) times daily.       furosemide (LASIX) 40 MG tablet Take 40 mg by mouth 2 (two) times daily.       gabapentin (NEURONTIN) 100 MG capsule Take 100 mg by mouth 3 (three) times daily.       hydrocortisone cream 1 % Apply 1 application. topically 2 (two) times daily as needed for itching.       metoprolol succinate (TOPROL-XL) 100 MG 24 hr tablet Take 1 tablet (100 mg total) by mouth in the morning. Take with or immediately following a meal. 90 tablet 3   nitroGLYCERIN (NITROSTAT) 0.4 MG SL tablet Place 1 tablet (0.4 mg total) under the tongue every 5 (five) minutes x 3 doses as needed for chest pain. 10 tablet 12   tamsulosin (FLOMAX) 0.4 MG CAPS capsule Take 1 capsule (0.4 mg total) by mouth daily after supper. 30 capsule 3    No current facility-administered medications for this visit.       REVIEW OF SYSTEMS:  [X]  denotes positive finding, [ ]  denotes negative finding Cardiac   Comments:  Chest pain or chest pressure:      Shortness of breath upon exertion:      Short of breath when lying flat:      Irregular heart rhythm:             Vascular      Pain in calf, thigh, or hip brought on by ambulation:      Pain in feet at night that wakes you up from your sleep:       Blood clot in your veins:      Leg swelling:                  PHYSICAL EXAM:     Vitals:    11/13/22 1325 11/13/22 1329  BP: (!) 174/67 (!) 166/73  Pulse: 61    Temp: 98.4 F (36.9 C)    SpO2: 97%    Weight: 242 lb 3.2 oz (109.9 kg)    Height: 5\' 10"  (1.778 m)        GENERAL: The patient is a well-nourished male, in no acute distress. The vital signs are documented above.   DATA:  We imaged his right carotid artery today in our office.  He does have normal internal carotid artery above his bifurcation.  Bifurcation is in proximal to mid neck.  He does have high-grade stenosis over a focal length with calcification around this.  End-diastolic velocities are 112 cm/s.   MEDICAL ISSUES: Great asymptomatic carotid stenosis.  I again discussed options with the patient.  I feel he is best served with right carotid endarterectomy for reduction of stroke risk.  I did discuss complications to include 1 to 1-1/2% risk of stroke with surgery and very unlikely possibility of wound issues.  He understands and wishes to proceed with surgery as soon as possible.  He understands this would require probable 1 night hospitalization.  We will schedule this with Dr. Chestine Spore at his earliest convenience       Larina Earthly, MD [facility] Tel [phone]

## 2022-11-25 NOTE — Transfer of Care (Signed)
Immediate Anesthesia Transfer of Care Note  Patient: Timothy Mata  Procedure(s) Performed: ENDARTERECTOMY CAROTID WITH PATCH ANGIOPLASTY (Right: Neck) PATCH ANGIOPLASTY WITH 1 cm X 6 cm XENOSURE BIOLOGIC PATCH (Right: Neck)  Patient Location: PACU  Anesthesia Type:General  Level of Consciousness: drowsy, patient cooperative, and responds to stimulation  Airway & Oxygen Therapy: Patient Spontanous Breathing  Post-op Assessment: Report given to RN and Post -op Vital signs reviewed and stable  Post vital signs: Reviewed and stable  Last Vitals:  Vitals Value Taken Time  BP 192/65 11/25/22 1011  Temp    Pulse 60 11/25/22 1011  Resp 16 11/25/22 1011  SpO2 94 % 11/25/22 1011  Vitals shown include unvalidated device data.  Last Pain:  Vitals:   11/25/22 0726  TempSrc:   PainSc: 0-No pain      Patients Stated Pain Goal: 2 (11/25/22 0726)  Complications: No notable events documented.

## 2022-11-26 ENCOUNTER — Telehealth: Payer: Self-pay | Admitting: Physician Assistant

## 2022-11-26 ENCOUNTER — Encounter (HOSPITAL_COMMUNITY): Payer: Self-pay | Admitting: Vascular Surgery

## 2022-11-26 LAB — BASIC METABOLIC PANEL
Anion gap: 10 (ref 5–15)
BUN: 25 mg/dL — ABNORMAL HIGH (ref 8–23)
CO2: 24 mmol/L (ref 22–32)
Calcium: 8.3 mg/dL — ABNORMAL LOW (ref 8.9–10.3)
Chloride: 102 mmol/L (ref 98–111)
Creatinine, Ser: 1.76 mg/dL — ABNORMAL HIGH (ref 0.61–1.24)
GFR, Estimated: 37 mL/min — ABNORMAL LOW (ref >60)
Glucose, Bld: 128 mg/dL — ABNORMAL HIGH (ref 70–99)
Potassium: 4.1 mmol/L (ref 3.5–5.1)
Sodium: 136 mmol/L (ref 135–145)

## 2022-11-26 LAB — CBC
HCT: 38.9 % — ABNORMAL LOW (ref 39.0–52.0)
Hemoglobin: 12.4 g/dL — ABNORMAL LOW (ref 13.0–17.0)
MCH: 29.5 pg (ref 26.0–34.0)
MCHC: 31.9 g/dL (ref 30.0–36.0)
MCV: 92.4 fL (ref 80.0–100.0)
Platelets: 231 10*3/uL (ref 150–400)
RBC: 4.21 MIL/uL — ABNORMAL LOW (ref 4.22–5.81)
RDW: 16.5 % — ABNORMAL HIGH (ref 11.5–15.5)
WBC: 19.7 10*3/uL — ABNORMAL HIGH (ref 4.0–10.5)
nRBC: 0 % (ref 0.0–0.2)

## 2022-11-26 LAB — LIPID PANEL
Cholesterol: 148 mg/dL (ref 0–200)
HDL: 47 mg/dL (ref >40)
LDL Cholesterol: 90 mg/dL (ref 0–99)
Total CHOL/HDL Ratio: 3.1 RATIO
Triglycerides: 55 mg/dL (ref <150)
VLDL: 11 mg/dL (ref 0–40)

## 2022-11-26 MED ORDER — ASPIRIN 81 MG PO CHEW
81.0000 mg | CHEWABLE_TABLET | Freq: Every day | ORAL | Status: DC
  Administered 2022-11-26: 81 mg via ORAL
  Filled 2022-11-26: qty 1

## 2022-11-26 MED ORDER — ASPIRIN 81 MG PO CHEW: 81.0000 mg | CHEWABLE_TABLET | Freq: Every day | ORAL | Status: DC

## 2022-11-26 MED ORDER — OXYCODONE-ACETAMINOPHEN 5-325 MG PO TABS: 1.0000 | ORAL_TABLET | Freq: Three times a day (TID) | ORAL | 0 refills | Status: DC | PRN

## 2022-11-26 MED ORDER — ATORVASTATIN CALCIUM 40 MG PO TABS: 80.0000 mg | ORAL_TABLET | Freq: Every day | ORAL | 3 refills | Status: DC

## 2022-11-26 NOTE — Discharge Summary (Signed)
Discharge Summary     Timothy Mata January 19, 1933 88 y.o. male  [id number]  Admission Date: 11/25/2022  Discharge Date: 11/26/2022  Physician: Cephus Shelling, MD  Admission Diagnosis: Carotid artery stenosis [I65.29] Asymptomatic carotid artery stenosis without infarction, right [I65.21]   HPI:   This is a 88 y.o. male ***  Hospital Course:  The patient was admitted to the hospital and taken to the operating room on 11/25/2022 and underwent ***    Findings: ***  The pt tolerated the procedure well and was transported to the PACU in {DESC; GOOD/FAIR/POOR:18685} condition.   By POD 1, the pt neuro status ***   Recent Labs    11/25/22 0723 11/25/22 0732 11/26/22 0426  NA 141 140 136  K 3.8 3.9 4.1  CL 102 102 102  CO2 26  --  24  GLUCOSE 102* 108* 128*  BUN 23 24* 25*  CALCIUM 9.0  --  8.3*   Recent Labs    11/25/22 0723 11/25/22 0732 11/26/22 0426  WBC 11.6*  --  19.7*  HGB 14.5 14.6 12.4*  HCT 47.0 43.0 38.9*  PLT 227  --  231   No results for input(s): "INR" in the last 72 hours.   Discharge Instructions     Discharge patient   Complete by: As directed    Dc home once pt has walked and voided.  Thanks.   Discharge disposition: 01-Home or Self Care   Discharge patient date: 11/26/2022       Discharge Diagnosis:  Carotid artery stenosis [I65.29] Asymptomatic carotid artery stenosis without infarction, right [I65.21]  Secondary Diagnosis: Patient Active Problem List   Diagnosis Date Noted   Carotid artery stenosis 11/25/2022   Asymptomatic carotid artery stenosis without infarction, right 11/25/2022   Benign prostatic hyperplasia 07/05/2022   Hyperlipidemia 07/05/2022   Atrial fibrillation with RVR (HCC) 07/05/2022   HFrEF (heart failure with reduced ejection fraction) (HCC) 07/05/2022   COPD (chronic obstructive pulmonary disease) (HCC)    Stage 3a chronic kidney disease (CKD) (HCC)    Abnormal urinalysis    Hypokalemia     Essential hypertension 03/26/2022   NSTEMI (non-ST elevated myocardial infarction) (HCC) 03/24/2022   Preretinal fibrosis, left eye 07/18/2015   Past Medical History:  Diagnosis Date   Atrial flutter (HCC)    a. s/p DCCV in 03/2022 b. repeat EKG at follow-up from DCCV showing atrial fibrillation   BPH (benign prostatic hyperplasia)    COPD (chronic obstructive pulmonary disease) (HCC)    Dysrhythmia    Gout    "left big toe"   History of kidney stones    HOH (hard of hearing)    Hypercholesterolemia    Hypertension    Preretinal fibrosis, left eye    Spinal headache 12/30/1972    Allergies as of 11/26/2022   No Known Allergies      Medication List     TAKE these medications    allopurinol 300 MG tablet Commonly known as: ZYLOPRIM Take 300 mg by mouth daily after supper.   amiodarone 200 MG tablet Commonly known as: PACERONE Take 1 tablet (200 mg total) by mouth daily.   apixaban 2.5 MG Tabs tablet Commonly known as: ELIQUIS Take 1 tablet (2.5 mg total) by mouth 2 (two) times daily.   aspirin 81 MG chewable tablet Chew 1 tablet (81 mg total) by mouth daily.   atorvastatin 40 MG tablet Commonly known as: LIPITOR Take 1 tablet (40 mg total) by mouth daily.   Fluticasone-Salmeterol  250-50 MCG/DOSE Aepb Commonly known as: ADVAIR Inhale 1 puff into the lungs 2 (two) times daily.   furosemide 40 MG tablet Commonly known as: LASIX Take 40 mg by mouth 2 (two) times daily.   hydrocortisone cream 1 % Apply 1 application. topically 2 (two) times daily as needed for itching.   metoprolol succinate 100 MG 24 hr tablet Commonly known as: TOPROL-XL Take 1 tablet (100 mg total) by mouth in the morning. Take with or immediately following a meal.   nitroGLYCERIN 0.4 MG SL tablet Commonly known as: NITROSTAT Place 1 tablet (0.4 mg total) under the tongue every 5 (five) minutes x 3 doses as needed for chest pain.   oxyCODONE-acetaminophen 5-325 MG tablet Commonly  known as: Percocet Take 1 tablet by mouth every 8 (eight) hours as needed for severe pain.   tamsulosin 0.4 MG Caps capsule Commonly known as: FLOMAX Take 1 capsule (0.4 mg total) by mouth daily after supper.         [facility] Discharge Instructions Carotid Endarterectomy (CEA)  Please refer to the following instructions for your post-procedure care. Your surgeon or physician assistant will discuss any changes with you.  Activity  You are encouraged to walk as much as you can. You can slowly return to normal activities but must avoid strenuous activity and heavy lifting until your doctor tell you it's OK. Avoid activities such as vacuuming or swinging a golf club. You can drive after one week if you are comfortable and you are no longer taking prescription pain medications. It is normal to feel tired for serval weeks after your surgery. It is also normal to have difficulty with sleep habits, eating, and bowel movements after surgery. These will go away with time.  Bathing/Showering  You may shower after you come home. Do not soak in a bathtub, hot tub, or swim until the incision heals completely.  Incision Care  Shower every day. Clean your incision with mild soap and water. Pat the area dry with a clean towel. You do not need a bandage unless otherwise instructed. Do not apply any ointments or creams to your incision. You may have skin glue on your incision. Do not peel it off. It will come off on its own in about one week. Your incision may feel thickened and raised for several weeks after your surgery. This is normal and the skin will soften over time. For Men Only: It's OK to shave around the incision but do not shave the incision itself for 2 weeks. It is common to have numbness under your chin that could last for several months.  Diet  Resume your normal diet. There are no special food restrictions following this procedure. A low fat/low  cholesterol diet is recommended for all patients with vascular disease. In order to heal from your surgery, it is CRITICAL to get adequate nutrition. Your body requires vitamins, minerals, and protein. Vegetables are the best source of vitamins and minerals. Vegetables also provide the perfect balance of protein. Processed food has little nutritional value, so try to avoid this.  Medications  Resume taking all of your medications unless your doctor or physician assistant tells you not to.  If your incision is causing pain, you may take over-the- counter pain relievers such as acetaminophen (Tylenol). If you were prescribed a stronger pain medication, please be aware these medications can cause nausea and constipation.  Prevent nausea by taking the medication with a snack or meal. Avoid constipation  by drinking plenty of fluids and eating foods with a high amount of fiber, such as fruits, vegetables, and grains.  Do not take Tylenol if you are taking prescription pain medications.  Restart Eliquis on 11/27/2022  Follow Up  Our office will schedule a follow up appointment 2-3 weeks following discharge.  Please call us immediately for any of the following conditions  Increased pain, redness, drainage (pus) from your incision site. Fever of 101 degrees or higher. If you should develop stroke (slurred speech, difficulty swallowing, weakness on one side of your body, loss of vision) you should call 911 and go to the nearest emergency room.  Reduce your risk of vascular disease:  Stop smoking. If you would like help call [facility] at 1-800-QUIT-NOW (646-445-7735) or [place] at (812) 325-1438. Manage your cholesterol Maintain a desired weight Control your diabetes Keep your blood pressure down  If you have any questions, please call the office at [phone].  Prescriptions given: 1.   Roxicet #6 No Refill 2.   Aspirin 81mg  daily OTC  Disposition: home  Patient's condition: is  Good  Follow up: 1. Dr. in 2-3 weeks    Arbie Cookey, PA-C [facility] [phone]   --- For [facility] use ---   Modified Rankin score at D/C (0-6): 0  IV medication needed for:  1. Hypertension: No 2. Hypotension: No  Post-op Complications: No  1. Post-op CVA or TIA: No  If yes: Event classification (right eye, left eye, right cortical, left cortical, verterobasilar, other): n/a  If yes: Timing of event (intra-op, <6 hrs post-op, >=6 hrs post-op, unknown): n/a  2. CN injury: No  If yes: CN n/a injuried   3. Myocardial infarction: No  If yes: Dx by (EKG or clinical, Troponin): n/a  4.  CHF: No  5.  Dysrhythmia (new): No  6. Wound infection: No  7. Reperfusion symptoms: No  8. Return to OR: No  If yes: return to OR for (bleeding, neurologic, other CEA incision, other): n/a  Discharge medications: Statin use:  Yes ASA use:  Yes   Beta blocker use:  Yes ACE-Inhibitor use:  No  ARB use:  No CCB use: No P2Y12 Antagonist use: No, [ ]  Plavix, [ ]  Plasugrel, [ ]  Ticlopinine, [ ]  Ticagrelor, [ ]  Other, [ ]  No for medical reason, [ ]  Non-compliant, [ ]  Not-indicated Anti-coagulant use:  Yes, [ ]  Warfarin, [ ]  Rivaroxaban, [ ]  Dabigatran,  Eliquis

## 2022-11-26 NOTE — Progress Notes (Signed)
[facility] [place]  Subjective  -no complaints.  Wants to go home.   Objective (!) 130/52 (!) 57 98.1 F (36.7 C) (Oral) 14 94%  Intake/Output Summary (Last 24 hours) at 11/26/2022 0644 Last data filed at 11/26/2022 0400 Gross per 24 hour  Intake 2300.07 ml  Output 550 ml  Net 1750.07 ml    Right neck incision clean dry and intact with no hematoma Grossly neurologically intact and cranial nerves II through XII intact  Laboratory Lab Results: Recent Labs    11/25/22 0723 11/25/22 0732 11/26/22 0426  WBC 11.6*  --  19.7*  HGB 14.5 14.6 12.4*  HCT 47.0 43.0 38.9*  PLT 227  --  231   BMET Recent Labs    11/25/22 0723 11/25/22 0732 11/26/22 0426  NA 141 140 136  K 3.8 3.9 4.1  CL 102 102 102  CO2 26  --  24  GLUCOSE 102* 108* 128*  BUN 23 24* 25*  CREATININE 2.00* 1.90* 1.76*  CALCIUM 9.0  --  8.3*    COAG Lab Results  Component Value Date   INR 1.3 (H) 04/24/2022   No results found for: "PTT"  Assessment/Planning:  Postop day 1 status post right carotid endarterectomy for an asymptomatic high-grade stenosis.  He has done very well and his neck incision looks good and is grossly neurologically intact.  Plan discharge home today.  He wants to follow-up in [place] so we will arrange follow-up in 2 to 3 weeks with Dr. Arbie Cookey for incision check.  Discussed he start an 81 mg aspirin for risk reduction.  He can resume his Eliquis tomorrow.  Discussed he call with questions or concerns.  Timothy Mata 11/26/2022 6:44 AM --

## 2022-11-26 NOTE — Progress Notes (Signed)
Mobility Specialist Progress Note:   11/26/22 0948  Mobility  Activity Ambulated with assistance in hallway  Level of Assistance Standby assist, set-up cues, supervision of patient - no hands on  Assistive Device None  Distance Ambulated (ft) 280 ft  Activity Response Tolerated well  $Mobility charge 1 Mobility   Pt received in bed willing to participate in mobility. No complaints of pain. Left in bed with call bell in reach and all needs met.   Gareth Eagle Benz [place] Mobility Specialist Please contact via [employer] or  Rehab Office at [phone]

## 2022-11-26 NOTE — Progress Notes (Signed)
D/c tele and IV. Went over AVS with pt and all questions were addressed.   Floris Neuhaus S Zaiah Eckerson, RN  

## 2022-11-26 NOTE — Telephone Encounter (Signed)
-----   Message from Dara Lords, [place] sent at 11/25/2022 10:30 AM EST ----- S/p right CEA 11/27. F/u with Dr. Arbie Cookey in [place] in 2-3 weeks for incision check.   Thanks

## 2022-11-26 NOTE — Progress Notes (Signed)
PHARMACIST LIPID MONITORING   Timothy Mata is a 88 y.o. male admitted on 11/25/2022 s/p CEA.  Pharmacy has been consulted to optimize lipid-lowering therapy with the indication of secondary prevention for clinical ASCVD.  Recent Labs:  Lipid Panel (last 6 months):   Lab Results  Component Value Date   CHOL 148 11/26/2022   TRIG 55 11/26/2022   HDL 47 11/26/2022   CHOLHDL 3.1 11/26/2022   VLDL 11 11/26/2022   LDLCALC 90 11/26/2022    Hepatic function panel (last 6 months):   Lab Results  Component Value Date   AST 21 11/25/2022   ALT 25 11/25/2022   ALKPHOS 100 11/25/2022   BILITOT 0.7 11/25/2022    SCr (since admission):   Serum creatinine: 1.76 mg/dL (H) 79/89/21 1941 Estimated creatinine clearance: 37.2 mL/min (A)  Current therapy and lipid therapy tolerance Current lipid-lowering therapy: atorvastatin 40mg /day Documented or reported allergies or intolerances to lipid-lowering therapies (if applicable): none   Plan:    1.Statin intensity (high intensity recommended for all patients regardless of the LDL):  Increase atorvastatin to 80mg  po daily  2.Add ezetimibe (if any one of the following):   Not indicated at this time.  3.Refer to lipid clinic:   No  4.Follow-up with:  Cardiology provider - , MD  5.Follow-up labs after discharge:  Changes in lipid therapy were made. Check a lipid panel in 8-12 weeks then annually.     Dietrich Pates, PharmD Clinical Pharmacist **Pharmacist phone directory can now be found on [email or web address] ([email or web address]).  Listed under [facility].

## 2022-11-27 ENCOUNTER — Ambulatory Visit (HOSPITAL_COMMUNITY): Payer: Medicare HMO

## 2022-12-03 ENCOUNTER — Telehealth: Payer: Self-pay | Admitting: *Deleted

## 2022-12-03 MED ORDER — METOPROLOL SUCCINATE ER 100 MG PO TB24: 100.0000 mg | ORAL_TABLET | Freq: Every morning | ORAL | 1 refills | Status: DC

## 2022-12-03 NOTE — Telephone Encounter (Signed)
Rx refill sent to pharmacy. 

## 2022-12-05 ENCOUNTER — Other Ambulatory Visit: Payer: Self-pay

## 2022-12-05 MED ORDER — METOPROLOL SUCCINATE ER 100 MG PO TB24: 100.0000 mg | ORAL_TABLET | Freq: Every morning | ORAL | 1 refills | Status: DC

## 2022-12-09 ENCOUNTER — Other Ambulatory Visit: Payer: Self-pay

## 2022-12-09 ENCOUNTER — Ambulatory Visit (HOSPITAL_COMMUNITY): Admission: RE | Admit: 2022-12-09 | Payer: Medicare HMO | Source: Ambulatory Visit

## 2022-12-09 MED ORDER — METOPROLOL SUCCINATE ER 100 MG PO TB24: 100.0000 mg | ORAL_TABLET | Freq: Every morning | ORAL | 1 refills | Status: DC

## 2022-12-09 NOTE — Telephone Encounter (Signed)
Refilled toprol xl 100 mg mg,#90,RF:1 to [facility] [place] [place]

## 2022-12-11 ENCOUNTER — Other Ambulatory Visit: Payer: Self-pay

## 2022-12-11 ENCOUNTER — Encounter: Payer: Self-pay | Admitting: Vascular Surgery

## 2022-12-11 ENCOUNTER — Ambulatory Visit (INDEPENDENT_AMBULATORY_CARE_PROVIDER_SITE_OTHER): Payer: Medicare HMO | Admitting: Vascular Surgery

## 2022-12-11 VITALS — BP 143/54 | HR 60 | Temp 97.3°F | Ht 70.0 in | Wt 244.6 lb

## 2022-12-11 DIAGNOSIS — I6521 Occlusion and stenosis of right carotid artery: Secondary | ICD-10-CM

## 2022-12-11 DIAGNOSIS — I6529 Occlusion and stenosis of unspecified carotid artery: Secondary | ICD-10-CM

## 2022-12-11 NOTE — Progress Notes (Signed)
   [facility] [place]  Patient name: Timothy Mata MRN: [id number] DOB: [birth year] Sex: male  REASON FOR VISIT: Follow-up status post right carotid endarterectomy with Dr. Chestine Spore on 11/26/19 23  HPI: Timothy Mata is a 88 y.o. male here today for follow-up.  He underwent uneventful carotid endarterectomy for severe asymptomatic disease.  He was discharged home on postoperative day 1 and has had no postoperative difficulties.  He has returned to his baseline.  He has his usual peri-incisional numbness.  Current Outpatient Medications  Medication Sig Dispense Refill   allopurinol (ZYLOPRIM) 300 MG tablet Take 300 mg by mouth daily after supper.   2   amiodarone (PACERONE) 200 MG tablet Take 1 tablet (200 mg total) by mouth daily. 90 tablet 3   apixaban (ELIQUIS) 2.5 MG TABS tablet Take 1 tablet (2.5 mg total) by mouth 2 (two) times daily. 60 tablet 11   aspirin 81 MG chewable tablet Chew 1 tablet (81 mg total) by mouth daily.     atorvastatin (LIPITOR) 40 MG tablet Take 2 tablets (80 mg total) by mouth daily. 90 tablet 3   Fluticasone-Salmeterol (ADVAIR) 250-50 MCG/DOSE AEPB Inhale 1 puff into the lungs 2 (two) times daily.     furosemide (LASIX) 40 MG tablet Take 40 mg by mouth 2 (two) times daily.     hydrocortisone cream 1 % Apply 1 application. topically 2 (two) times daily as needed for itching.     metoprolol succinate (TOPROL-XL) 100 MG 24 hr tablet Take 1 tablet (100 mg total) by mouth in the morning. Take with or immediately following a meal. 90 tablet 1   nitroGLYCERIN (NITROSTAT) 0.4 MG SL tablet Place 1 tablet (0.4 mg total) under the tongue every 5 (five) minutes x 3 doses as needed for chest pain. 10 tablet 12   oxyCODONE-acetaminophen (PERCOCET) 5-325 MG tablet Take 1 tablet by mouth every 8 (eight) hours as needed for severe pain. 6 tablet 0   tamsulosin (FLOMAX) 0.4 MG CAPS capsule Take 1 capsule (0.4 mg total) by mouth daily  after supper. 30 capsule 3   No current facility-administered medications for this visit.     PHYSICAL EXAM: Vitals:   12/11/22 0947 12/11/22 0949  BP: (!) 137/58 (!) 143/54  Pulse: 60   Temp: (!) 97.3 F (36.3 C)   SpO2: 95%   Weight: 244 lb 9.6 oz (110.9 kg)   Height: 5\' 10"  (1.778 m)     GENERAL: The patient is a well-nourished male, in no acute distress. The vital signs are documented above. Right neck incision is well-healed.  No carotid bruits bilaterally.  Grossly intact neurologically  MEDICAL ISSUES: Stable status post right carotid endarterectomy.  He will resume full activities.  We will see him again in 9 months with repeat carotid duplex   , MD FACS [facility] [phone]  Note: Portions of this report may have been transcribed using voice recognition software.  Every effort has been made to ensure accuracy; however, inadvertent computerized transcription errors may still be present.

## 2023-03-11 ENCOUNTER — Ambulatory Visit: Payer: Medicare HMO | Attending: Internal Medicine | Admitting: Internal Medicine

## 2023-03-11 ENCOUNTER — Encounter: Payer: Self-pay | Admitting: Internal Medicine

## 2023-03-11 VITALS — BP 140/60 | HR 62 | Ht 70.0 in | Wt 251.0 lb

## 2023-03-11 DIAGNOSIS — I4819 Other persistent atrial fibrillation: Secondary | ICD-10-CM

## 2023-03-11 MED ORDER — AMIODARONE HCL 200 MG PO TABS: ORAL_TABLET | ORAL | 3 refills | Status: DC

## 2023-03-11 NOTE — Progress Notes (Signed)
HPI Timothy Mata returns today for followup. He is a pleasant 89 yo man with a h/o COPD, dyslipidemia, and atrial flutter/fib who has undergone DCCV twice. He was placed on amiodarone and appears to have maintained NSR. He is s/p right CEA since his last visit. He had a hemothorax after falling off of his riding mower. He feels well on amio and in NSR. He denies palpitations. No syncope.  No Known Allergies   Current Outpatient Medications  Medication Sig Dispense Refill   allopurinol (ZYLOPRIM) 300 MG tablet Take 300 mg by mouth daily after supper.   2   amiodarone (PACERONE) 200 MG tablet Take 1 tablet (200 mg total) by mouth daily. 90 tablet 3   apixaban (ELIQUIS) 2.5 MG TABS tablet Take 1 tablet (2.5 mg total) by mouth 2 (two) times daily. 60 tablet 11   aspirin 81 MG chewable tablet Chew 1 tablet (81 mg total) by mouth daily.     atorvastatin (LIPITOR) 40 MG tablet Take 2 tablets (80 mg total) by mouth daily. 90 tablet 3   Fluticasone-Salmeterol (ADVAIR) 250-50 MCG/DOSE AEPB Inhale 1 puff into the lungs 2 (two) times daily.     furosemide (LASIX) 40 MG tablet Take 40 mg by mouth 2 (two) times daily.     hydrocortisone cream 1 % Apply 1 application. topically 2 (two) times daily as needed for itching.     metoprolol succinate (TOPROL-XL) 100 MG 24 hr tablet Take 1 tablet (100 mg total) by mouth in the morning. Take with or immediately following a meal. 90 tablet 1   nitroGLYCERIN (NITROSTAT) 0.4 MG SL tablet Place 1 tablet (0.4 mg total) under the tongue every 5 (five) minutes x 3 doses as needed for chest pain. 10 tablet 12   tamsulosin (FLOMAX) 0.4 MG CAPS capsule Take 1 capsule (0.4 mg total) by mouth daily after supper. 30 capsule 3   oxyCODONE-acetaminophen (PERCOCET) 5-325 MG tablet Take 1 tablet by mouth every 8 (eight) hours as needed for severe pain. (Patient not taking: Reported on 03/11/2023) 6 tablet 0   No current facility-administered medications for this visit.      Past Medical History:  Diagnosis Date   Atrial flutter ([place])    a. s/p DCCV in 03/2022 b. repeat EKG at follow-up from DCCV showing atrial fibrillation   BPH (benign prostatic hyperplasia)    COPD (chronic obstructive pulmonary disease) ([place])    Dysrhythmia    Gout    "left big toe"   History of kidney stones    HOH (hard of hearing)    Hypercholesterolemia    Hypertension    Preretinal fibrosis, left eye    Spinal headache 12/30/1972    ROS:   All systems reviewed and negative except as noted in the HPI.   Past Surgical History:  Procedure Laterality Date   25 GAUGE PARS PLANA VITRECTOMY WITH 20 GAUGE MVR PORT Left 07/18/2015   Procedure: 25 GAUGE PARS PLANA VITRECTOMY WITH 20 GAUGE MVR PORT;  Surgeon: Hayden Pedro, MD;  Location: [place];  Service: Ophthalmology;  Laterality: Left;   AIR/FLUID EXCHANGE Left 07/18/2015   Procedure: AIR/FLUID EXCHANGE;  Surgeon: Hayden Pedro, MD;  Location: [place];  Service: Ophthalmology;  Laterality: Left;   BACK SURGERY     CARDIOVERSION N/A 04/26/2022   Procedure: CARDIOVERSION;  Surgeon: Satira Sark, MD;  [facility]: [facility];  Service: Cardiovascular;  Laterality: N/A;   ENDARTERECTOMY Right 11/25/2022   Procedure: ENDARTERECTOMY  CAROTID WITH PATCH ANGIOPLASTY;  Surgeon: [name], MD;  Location: [place];  Service: Vascular;  Laterality: Right;   EYE SURGERY     FOOT FRACTURE SURGERY Right [place] Left 07/18/2015   Procedure: LASER PHOTO ABLATION;  Surgeon: Hayden Pedro, MD;  Location: [place];  Service: Ophthalmology;  Laterality: Left;  head scope    LUMBAR DISC SURGERY  1974   MEMBRANE PEEL Left 07/18/2015   Procedure: MEMBRANE PEEL;  Surgeon: Hayden Pedro, MD;  Location: [place];  Service: Ophthalmology;  Laterality: Left;   MULTIPLE TOOTH EXTRACTIONS  1990's   PARS PLANA VITRECTOMY Left 07/18/2015   PATCH ANGIOPLASTY Right 11/25/2022   Procedure: PATCH ANGIOPLASTY WITH 1 cm X 6 cm [place];  Surgeon: [name], MD;  Location: [place];  Service: Vascular;  Laterality: Right;     Family History  Problem Relation Age of Onset   Cancer Father      Social History   Socioeconomic History   Marital status: Married    Spouse name: Not on file   Number of children: Not on file   Years of education: Not on file   Highest education level: Not on file  Occupational History   Not on file  Tobacco Use   Smoking status: Former    Packs/day: 1.00    Years: 32.00    Total pack years: 32.00    Types: Cigarettes   Smokeless tobacco: Never   Tobacco comments:    quit smoking at age 14  Vaping Use   Vaping Use: Never used  Substance and Sexual Activity   Alcohol use: No   Drug use: No   Sexual activity: Not on file  Other Topics Concern   Not on file  Social History Narrative   Not on file   Social Determinants of Health   Financial Resource Strain: Not on file  Food Insecurity: No Food Insecurity (11/25/2022)   Hunger Vital Sign    Worried About Running Out of Food in the Last Year: Never true    Ran Out of Food in the Last Year: Never true  Transportation Needs: No Transportation Needs (11/25/2022)   PRAPARE - Hydrologist (Medical): No    Lack of Transportation (Non-Medical): No  Physical Activity: Not on file  Stress: Not on file  Social Connections: Not on file  Intimate Partner Violence: Not At Risk (11/25/2022)   Humiliation, Afraid, Rape, and Kick questionnaire    Fear of Current or Ex-Partner: No    Emotionally Abused: No    Physically Abused: No    Sexually Abused: No     BP (!) 140/60   Pulse 62   Ht '5\' 10"'$  (1.778 m)   Wt 251 lb (113.9 kg)   SpO2 96%   BMI 36.01 kg/m   Physical Exam:  Well appearing NAD HEENT: Unremarkable Neck:  No JVD, no thyromegally;right CEA scar Lymphatics:  No adenopathy Back:  No CVA tenderness Lungs:  Clear withj no wheezes HEART:  Regular rate rhythm, no  murmurs, no rubs, no clicks Abd:  soft, positive bowel sounds, no organomegally, no rebound, no guarding Ext:  2 plus pulses, no edema, no cyanosis, no clubbing Skin:  No rashes no nodules Neuro:  CN II through XII intact, motor grossly intact  EKG - nsr   Assess/Plan:  Atypical atrial flutter - He is maintaining NSR on amiodarone. I would continue  200 mg daily but none on sunday.  HTN - his bp is fairly well controlled. No change. Hemothorax - he is s/p surgery and doing well with no residual symptoms.  Tobacco abuse - in remission for almost 27 years.   Carleene Overlie Nettie Cromwell,MD  Atypical atrial flutter - He is maintaining NSR on amiodarone. I would continue 200 mg daily.  HTN - his bp is actually low today. Hemothorax - he is s/p surgery and doing well with no residual symptoms.    Carleene Overlie Tanijah Morais,MD

## 2023-03-11 NOTE — Patient Instructions (Signed)
Medication Instructions:   Take Amiodarone 200 mg daily but none on Sunday   *If you need a refill on your cardiac medications before your next appointment, please call your pharmacy*   Lab Work: NONE   If you have labs (blood work) drawn today and your tests are completely normal, you will receive your results only by: [place] (if you have MyChart) OR A paper copy in the mail If you have any lab test that is abnormal or we need to change your treatment, we will call you to review the results.   Testing/Procedures: NONE    Follow-Up: At [facility], you and your health needs are our priority.  As part of our continuing mission to provide you with exceptional heart care, we have created designated Provider Care Teams.  These Care Teams include your primary Cardiologist (physician) and Advanced Practice Providers (APPs -  Physician Assistants and Nurse Practitioners) who all work together to provide you with the care you need, when you need it.  We recommend signing up for the patient portal called "MyChart".  Sign up information is provided on this After Visit Summary.  MyChart is used to connect with patients for Virtual Visits (Telemedicine).  Patients are able to view lab/test results, encounter notes, upcoming appointments, etc.  Non-urgent messages can be sent to your provider as well.   To learn more about what you can do with MyChart, go to [email or web address].    Your next appointment:   1 year(s)  Provider:   Cristopher Peru, MD    Other Instructions Thank you for choosing Home!

## 2023-03-27 ENCOUNTER — Other Ambulatory Visit: Payer: Self-pay | Admitting: *Deleted

## 2023-03-27 MED ORDER — ATORVASTATIN CALCIUM 80 MG PO TABS: 80.0000 mg | ORAL_TABLET | Freq: Every day | ORAL | 0 refills | Status: DC

## 2023-05-20 ENCOUNTER — Other Ambulatory Visit: Payer: Self-pay | Admitting: Student

## 2023-05-21 NOTE — Telephone Encounter (Signed)
Prescription refill request for Eliquis received. Indication: AF Last office visit: 03/11/23  Rosette Reveal MD Scr: 1.76 on 11/26/22  Epic Age: 89 Weight: 113.9kg  Based on above findings Eliquis 2.5mg  twice daily is the appropriate dose.  Refill approved.

## 2023-05-29 ENCOUNTER — Other Ambulatory Visit: Payer: Self-pay | Admitting: Student

## 2023-06-12 ENCOUNTER — Other Ambulatory Visit: Payer: Self-pay | Admitting: Internal Medicine

## 2023-07-01 ENCOUNTER — Encounter: Payer: Self-pay | Admitting: Internal Medicine

## 2023-07-01 ENCOUNTER — Ambulatory Visit: Payer: Medicare HMO | Attending: Internal Medicine | Admitting: Internal Medicine

## 2023-07-01 VITALS — BP 160/60 | HR 60 | Ht 70.0 in | Wt 249.0 lb

## 2023-07-01 DIAGNOSIS — I4819 Other persistent atrial fibrillation: Secondary | ICD-10-CM

## 2023-07-01 NOTE — Progress Notes (Signed)
Cardiology Office Note   Date:  07/01/2023   ID:  Timothy Mata, DOB 01-Jul-1933, MRN [id number]  PCP:  Royann Shivers, PA-C  Cardiologist:   Dietrich Pates, MD   Pt returns for follow up       History of Present Illness: Timothy Mata is a 89 y.o. male with a history of CV dz (s/p R CEA) COPD, HL, atrial fib/flutter   He has had cardioversion x 2   On amildarone   He was last seen in clinic in March 2024 by Rosette Reveal   Since seen the pt denies CP  He denies palpitations  Breathing is OK   Denies dizziness    He does not take his BP at home           Current Meds  Medication Sig   allopurinol (ZYLOPRIM) 300 MG tablet Take 300 mg by mouth daily after supper.    amiodarone (PACERONE) 200 MG tablet Take 200 mg Daily and none on Sunday   apixaban (ELIQUIS) 2.5 MG TABS tablet TAKE 1 TABLET BY MOUTH TWICE A DAY   aspirin 81 MG chewable tablet Chew 1 tablet (81 mg total) by mouth daily.   atorvastatin (LIPITOR) 80 MG tablet Take 1 tablet (80 mg total) by mouth daily.   Fluticasone-Salmeterol (ADVAIR) 250-50 MCG/DOSE AEPB Inhale 1 puff into the lungs 2 (two) times daily.   furosemide (LASIX) 40 MG tablet Take 40 mg by mouth 2 (two) times daily.   hydrocortisone cream 1 % Apply 1 application. topically 2 (two) times daily as needed for itching.   metoprolol succinate (TOPROL-XL) 100 MG 24 hr tablet TAKE 1 TABLET (100 MG TOTAL) BY MOUTH IN THE MORNING. TAKE WITH OR IMMEDIATELY FOLLOWING A MEAL.   nitroGLYCERIN (NITROSTAT) 0.4 MG SL tablet Place 1 tablet (0.4 mg total) under the tongue every 5 (five) minutes x 3 doses as needed for chest pain.   sulfamethoxazole-trimethoprim (BACTRIM DS) 800-160 MG tablet Take 1 tablet by mouth 2 (two) times daily.   tamsulosin (FLOMAX) 0.4 MG CAPS capsule Take 1 capsule (0.4 mg total) by mouth daily after supper.     Allergies:   Patient has no known allergies.   Past Medical History:  Diagnosis Date   Atrial flutter ([facility])    a. s/p DCCV in  03/2022 b. repeat EKG at follow-up from DCCV showing atrial fibrillation   BPH (benign prostatic hyperplasia)    COPD (chronic obstructive pulmonary disease) ([facility])    Dysrhythmia    Gout    "left big toe"   History of kidney stones    HOH (hard of hearing)    Hypercholesterolemia    Hypertension    Preretinal fibrosis, left eye    Spinal headache 12/30/1972    Past Surgical History:  Procedure Laterality Date   25 GAUGE PARS PLANA VITRECTOMY WITH 20 GAUGE MVR PORT Left 07/18/2015   Procedure: 25 GAUGE PARS PLANA VITRECTOMY WITH 20 GAUGE MVR PORT;  Surgeon: Sherrie George, MD;  Location[facility];  Service: Ophthalmology;  Laterality: Left;   AIR/FLUID EXCHANGE Left 07/18/2015   Procedure: AIR/FLUID EXCHANGE;  Surgeon: Sherrie George, MD;  [facility];  Service: Ophthalmology;  Laterality: Left;   BACK SURGERY     CARDIOVERSION N/A 04/26/2022   Procedure: CARDIOVERSION;  Surgeon: Jonelle Sidle, MD;  [facility];  Service: Cardiovascular;  Laterality: N/A;   ENDARTERECTOMY Right 11/25/2022   Procedure: ENDARTERECTOMY CAROTID WITH PATCH ANGIOPLASTY;  Surgeon: Sherald Hess  J, MD;  [facility];  Service: Vascular;  Laterality: Right;   EYE SURGERY     FOOT FRACTURE SURGERY Right 1981   LASER PHOTO ABLATION Left 07/18/2015   Procedure: LASER PHOTO ABLATION;  Surgeon: Sherrie George, MD;  Location[facility] OR;  Service: Ophthalmology;  Laterality: Left;  head scope    LUMBAR DISC SURGERY  1974   MEMBRANE PEEL Left 07/18/2015   Procedure: MEMBRANE PEEL;  Surgeon: Sherrie George, MD;  Location: [facility] OR;  Service: Ophthalmology;  Laterality: Left;   MULTIPLE TOOTH EXTRACTIONS  1990's   PARS PLANA VITRECTOMY Left 07/18/2015   PATCH ANGIOPLASTY Right 11/25/2022   Procedure: PATCH ANGIOPLASTY WITH 1 cm X 6 cm XENOSURE BIOLOGIC PATCH;  Surgeon: Cephus Shelling, MD;  Location: [facility] OR;  Service: Vascular;  Laterality: Right;     Social History:  The patient  reports that he  has quit smoking. His smoking use included cigarettes. He has a 32.00 pack-year smoking history. He has never used smokeless tobacco. He reports that he does not drink alcohol and does not use drugs.   Family History:  The patient's family history includes Cancer in his father.    ROS:  Please see the history of present illness. All other systems are reviewed and  Negative to the above problem except as noted.    PHYSICAL EXAM: VS:  BP (!) 160/60   Pulse 60   Ht 5\' 10"  (1.778 m)   Wt 249 lb (112.9 kg)   SpO2 95%   BMI 35.73 kg/m   GEN: Well nourished, well developed, in no acute distress  HEENT: normal  Neck: no JVD, carotid bruits,  Cardiac: RRR; no murmur  No LE edema  Respiratory:  clear to auscultation bilaterally,  GI: soft, nontender, No hepatomegaly    EKG:  EKG is ordered today.  NSR   First degree AV block      Lipid Panel    Component Value Date/Time   CHOL 148 11/26/2022 0426   TRIG 55 11/26/2022 0426   HDL 47 11/26/2022 0426   CHOLHDL 3.1 11/26/2022 0426   VLDL 11 11/26/2022 0426   LDLCALC 90 11/26/2022 0426      Wt Readings from Last 3 Encounters:  07/01/23 249 lb (112.9 kg)  03/11/23 251 lb (113.9 kg)  12/11/22 244 lb 9.6 oz (110.9 kg)      ASSESSMENT AND PLAN:  1  Atrial flutter   s/p DCCV x 2  On amiodarone Remains in SR on current regimen   Follows intermitt with G Taylor   2  HTN  BP is high today  He says lower  I have asked him to check at home    BP was 140/ in March  Check electrolytes    Keep on current regimen  Follow   3 HLD  Currently on lipitor  Will repeat lipids   4 Neuro   Hx of CVA  Currently on ASA and PLavix   Will review   Check CBC    Current medicines are reviewed at length with the patient today.  The patient does not have concerns regarding medicines.  Signed, Dietrich Pates, MD  07/01/2023 11:40 AM    [facility] [address] [place], [place], [place]  [address] Phone: [phone]; Fax: [phone]

## 2023-07-01 NOTE — Patient Instructions (Signed)
Medication Instructions:   Your physician recommends that you continue on your current medications as directed. Please refer to the Current Medication list given to you today.   Labwork: CBC,BMET,Lipids  Testing/Procedures: None today  Follow-Up: November  Any Other Special Instructions Will Be Listed Below (If Applicable).  If you need a refill on your cardiac medications before your next appointment, please call your pharmacy.

## 2023-07-02 ENCOUNTER — Other Ambulatory Visit (HOSPITAL_COMMUNITY)
Admission: RE | Admit: 2023-07-02 | Discharge: 2023-07-02 | Disposition: A | Discharge status: Home / Self Care | Payer: Medicare HMO | Source: Ambulatory Visit | Attending: Internal Medicine | Admitting: Internal Medicine

## 2023-07-02 DIAGNOSIS — I2510 Atherosclerotic heart disease of native coronary artery without angina pectoris: Secondary | ICD-10-CM | POA: Insufficient documentation

## 2023-07-02 DIAGNOSIS — I4819 Other persistent atrial fibrillation: Secondary | ICD-10-CM | POA: Insufficient documentation

## 2023-07-02 LAB — BASIC METABOLIC PANEL
Anion gap: 9 (ref 5–15)
BUN: 28 mg/dL — ABNORMAL HIGH (ref 8–23)
CO2: 27 mmol/L (ref 22–32)
Calcium: 8.6 mg/dL — ABNORMAL LOW (ref 8.9–10.3)
Chloride: 103 mmol/L (ref 98–111)
Creatinine, Ser: 2.39 mg/dL — ABNORMAL HIGH (ref 0.61–1.24)
GFR, Estimated: 26 mL/min — ABNORMAL LOW (ref >60)
Glucose, Bld: 100 mg/dL — ABNORMAL HIGH (ref 70–99)
Potassium: 4.1 mmol/L (ref 3.5–5.1)
Sodium: 139 mmol/L (ref 135–145)

## 2023-07-02 LAB — LIPID PANEL
Cholesterol: 139 mg/dL (ref 0–200)
HDL: 47 mg/dL (ref >40)
LDL Cholesterol: 71 mg/dL (ref 0–99)
Total CHOL/HDL Ratio: 3 RATIO
Triglycerides: 107 mg/dL (ref <150)
VLDL: 21 mg/dL (ref 0–40)

## 2023-07-02 LAB — CBC
HCT: 40 % (ref 39.0–52.0)
Hemoglobin: 13 g/dL (ref 13.0–17.0)
MCH: 30.4 pg (ref 26.0–34.0)
MCHC: 32.5 g/dL (ref 30.0–36.0)
MCV: 93.5 fL (ref 80.0–100.0)
Platelets: 240 10*3/uL (ref 150–400)
RBC: 4.28 MIL/uL (ref 4.22–5.81)
RDW: 16.4 % — ABNORMAL HIGH (ref 11.5–15.5)
WBC: 10.9 10*3/uL — ABNORMAL HIGH (ref 4.0–10.5)
nRBC: 0 % (ref 0.0–0.2)

## 2023-07-07 ENCOUNTER — Telehealth: Payer: Self-pay

## 2023-07-07 DIAGNOSIS — Z79899 Other long term (current) drug therapy: Secondary | ICD-10-CM

## 2023-07-07 MED ORDER — FUROSEMIDE 40 MG PO TABS: 40.0000 mg | ORAL_TABLET | Freq: Every day | ORAL | 3 refills | Status: DC

## 2023-07-07 NOTE — Telephone Encounter (Signed)
Results discussed with wife.Patient will decrease lasix to daily and repeat bmet,bnp in 2 weeks at [facility]

## 2023-07-07 NOTE — Telephone Encounter (Signed)
-----   Message from Bertram Millard, RN sent at 07/07/2023  7:13 AM EDT -----  ----- Message ----- From: Pricilla Riffle, MD Sent: 07/02/2023   9:05 PM EDT To: Bertram Millard, RN  CBC is OK Lipid panel is very good  Cr is 2.39   Wats 1.76 in Nov 2023 I would cut lasix back to daily    Follow BMET and BNP in 2 wks

## 2023-07-21 ENCOUNTER — Other Ambulatory Visit (HOSPITAL_COMMUNITY)
Admission: RE | Admit: 2023-07-21 | Discharge: 2023-07-21 | Disposition: A | Discharge status: Home / Self Care | Payer: Medicare HMO | Source: Ambulatory Visit | Attending: Internal Medicine | Admitting: Internal Medicine

## 2023-07-21 DIAGNOSIS — Z79899 Other long term (current) drug therapy: Secondary | ICD-10-CM | POA: Insufficient documentation

## 2023-07-21 LAB — BASIC METABOLIC PANEL
Anion gap: 8 (ref 5–15)
BUN: 22 mg/dL (ref 8–23)
CO2: 26 mmol/L (ref 22–32)
Calcium: 8.2 mg/dL — ABNORMAL LOW (ref 8.9–10.3)
Chloride: 104 mmol/L (ref 98–111)
Creatinine, Ser: 1.92 mg/dL — ABNORMAL HIGH (ref 0.61–1.24)
GFR, Estimated: 33 mL/min — ABNORMAL LOW (ref >60)
Glucose, Bld: 92 mg/dL (ref 70–99)
Potassium: 4.1 mmol/L (ref 3.5–5.1)
Sodium: 138 mmol/L (ref 135–145)

## 2023-07-21 LAB — BRAIN NATRIURETIC PEPTIDE: B Natriuretic Peptide: 224 pg/mL — ABNORMAL HIGH (ref 0.0–100.0)

## 2023-08-27 ENCOUNTER — Ambulatory Visit: Payer: Medicare HMO | Admitting: Vascular Surgery

## 2023-08-27 ENCOUNTER — Encounter: Payer: Self-pay | Admitting: Vascular Surgery

## 2023-08-27 VITALS — BP 188/74 | HR 54 | Temp 97.8°F | Resp 20 | Ht 70.0 in | Wt 253.0 lb

## 2023-08-27 DIAGNOSIS — I70213 Atherosclerosis of native arteries of extremities with intermittent claudication, bilateral legs: Secondary | ICD-10-CM

## 2023-08-27 NOTE — Progress Notes (Signed)
Patient ID: Timothy Mata, male   DOB: 11-04-1933, 89 y.o.   MRN: [id number]  Reason for Consult: Follow-up   Referred by Royann Shivers, *  Subjective:     HPI:  Timothy Mata is a 89 y.o. male  history of right carotid endarterectomy for asymptomatic disease.  He states that about 6 months ago he developed short distance cramping in his calfs after walking about 25 to 50 yards.  He does not have any tissue loss or ulceration.  He has never had this problem before.  He quit smoking over 20 years ago and smoked 1 pack/day at the time.  Denies any history of vascular disease or interventions.  Past Medical History:  Diagnosis Date   Atrial flutter (HCC)    a. s/p DCCV in 03/2022 b. repeat EKG at follow-up from DCCV showing atrial fibrillation   BPH (benign prostatic hyperplasia)    COPD (chronic obstructive pulmonary disease) (HCC)    Dysrhythmia    Gout    "left big toe"   History of kidney stones    HOH (hard of hearing)    Hypercholesterolemia    Hypertension    Preretinal fibrosis, left eye    Spinal headache 12/30/1972   Family History  Problem Relation Age of Onset   Cancer Father    Past Surgical History:  Procedure Laterality Date   25 GAUGE PARS PLANA VITRECTOMY WITH 20 GAUGE MVR PORT Left 07/18/2015   Procedure: 25 GAUGE PARS PLANA VITRECTOMY WITH 20 GAUGE MVR PORT;  Surgeon: Sherrie George, MD;  Location: [facility] OR;  Service: Ophthalmology;  Laterality: Left;   AIR/FLUID EXCHANGE Left 07/18/2015   Procedure: AIR/FLUID EXCHANGE;  Surgeon: Sherrie George, MD;  Location: [facility] OR;  Service: Ophthalmology;  Laterality: Left;   BACK SURGERY     CARDIOVERSION N/A 04/26/2022   Procedure: CARDIOVERSION;  Surgeon: Jonelle Sidle, MD;  Location: [facility] ORS;  Service: Cardiovascular;  Laterality: N/A;   ENDARTERECTOMY Right 11/25/2022   Procedure: ENDARTERECTOMY CAROTID WITH PATCH ANGIOPLASTY;  Surgeon: Cephus Shelling, MD;  Location: [facility] OR;  Service: Vascular;   Laterality: Right;   EYE SURGERY     FOOT FRACTURE SURGERY Right 1981   LASER PHOTO ABLATION Left 07/18/2015   Procedure: LASER PHOTO ABLATION;  Surgeon: Sherrie George, MD;  Location: [facility] OR;  Service: Ophthalmology;  Laterality: Left;  head scope    LUMBAR DISC SURGERY  1974   MEMBRANE PEEL Left 07/18/2015   Procedure: MEMBRANE PEEL;  Surgeon: Sherrie George, MD;  Location: [facility] OR;  Service: Ophthalmology;  Laterality: Left;   MULTIPLE TOOTH EXTRACTIONS  1990's   PARS PLANA VITRECTOMY Left 07/18/2015   PATCH ANGIOPLASTY Right 11/25/2022   Procedure: PATCH ANGIOPLASTY WITH 1 cm X 6 cm XENOSURE BIOLOGIC PATCH;  Surgeon: Cephus Shelling, MD;  Location: [facility] OR;  Service: Vascular;  Laterality: Right;    Short Social History:  Social History   Tobacco Use   Smoking status: Former    Current packs/day: 1.00    Average packs/day: 1 pack/day for 32.0 years (32.0 ttl pk-yrs)    Types: Cigarettes   Smokeless tobacco: Never   Tobacco comments:    quit smoking at age 38  Substance Use Topics   Alcohol use: No    No Known Allergies  Current Outpatient Medications  Medication Sig Dispense Refill   allopurinol (ZYLOPRIM) 300 MG tablet Take 300 mg by mouth daily after supper.   2   amiodarone (  PACERONE) 200 MG tablet Take 200 mg Daily and none on Sunday 90 tablet 3   apixaban (ELIQUIS) 2.5 MG TABS tablet TAKE 1 TABLET BY MOUTH TWICE A DAY 60 tablet 5   aspirin 81 MG chewable tablet Chew 1 tablet (81 mg total) by mouth daily.     atorvastatin (LIPITOR) 80 MG tablet Take 1 tablet (80 mg total) by mouth daily. 30 tablet 0   Fluticasone-Salmeterol (ADVAIR) 250-50 MCG/DOSE AEPB Inhale 1 puff into the lungs 2 (two) times daily.     furosemide (LASIX) 40 MG tablet Take 1 tablet (40 mg total) by mouth daily. 90 tablet 3   levothyroxine (SYNTHROID) 75 MCG tablet Take 75 mcg by mouth daily before breakfast.     metoprolol succinate (TOPROL-XL) 100 MG 24 hr tablet TAKE 1 TABLET (100 MG TOTAL) BY  MOUTH IN THE MORNING. TAKE WITH OR IMMEDIATELY FOLLOWING A MEAL. 90 tablet 1   tamsulosin (FLOMAX) 0.4 MG CAPS capsule Take 1 capsule (0.4 mg total) by mouth daily after supper. 30 capsule 3   nitroGLYCERIN (NITROSTAT) 0.4 MG SL tablet Place 1 tablet (0.4 mg total) under the tongue every 5 (five) minutes x 3 doses as needed for chest pain. (Patient not taking: Reported on 08/27/2023) 10 tablet 12   No current facility-administered medications for this visit.    Review of Systems  Constitutional:  Constitutional negative. HENT: HENT negative.  Eyes: Eyes negative.  Respiratory: Respiratory negative.  Cardiovascular: Positive for claudication and leg swelling.  GI: Gastrointestinal negative.  Musculoskeletal: Musculoskeletal negative.  Skin: Skin negative.  Neurological: Neurological negative. Hematologic: Positive for bruises/bleeds easily.  Psychiatric: Psychiatric negative.        Objective:  Objective   Vitals:   08/27/23 1416  BP: (!) 188/74  Pulse: (!) 54  Resp: 20  Temp: 97.8 F (36.6 C)  SpO2: 95%  Weight: 253 lb (114.8 kg)  Height: 5\' 10"  (1.778 m)   Body mass index is 36.3 kg/m.  Physical Exam HENT:     Head: Normocephalic.     Nose: Nose normal.     Mouth/Throat:     Mouth: Mucous membranes are moist.  Eyes:     Pupils: Pupils are equal, round, and reactive to light.  Neck:     Vascular: No carotid bruit.  Cardiovascular:     Pulses:          Popliteal pulses are 0 on the right side and 0 on the left side.       Dorsalis pedis pulses are 0 on the right side and 0 on the left side.       Posterior tibial pulses are 0 on the left side.  Musculoskeletal:        General: Normal range of motion.     Comments: Missing right thumb  Skin:    Capillary Refill: Capillary refill takes more than 3 seconds.  Neurological:     General: No focal deficit present.     Mental Status: He is alert.  Psychiatric:        Mood and Affect: Mood normal.        Thought  Content: Thought content normal.        Judgment: Judgment normal.     Data: ABIs at [facility] right 0.53 and left 0.52     Assessment/Plan:    89 year old male with history of right carotid endarterectomy now with short distance claudication within 50 yards that began about 6 months ago.  His  ABIs are 0.5 bilaterally interpreted from [facility].  Likely has SFA disease bilaterally.  Plan will be to follow-up in 1 year with carotid duplex and also bilateral lower extremity arterial duplex and ABIs.  If his symptoms worsen in the interim we can certainly evaluate for angiography from the left common femoral approach but would need to hold Eliquis.  Sure that we will see him back in 6 months.    Maeola Harman MD [facility]

## 2023-09-12 ENCOUNTER — Other Ambulatory Visit: Payer: Self-pay

## 2023-09-12 DIAGNOSIS — I6529 Occlusion and stenosis of unspecified carotid artery: Secondary | ICD-10-CM

## 2023-09-12 DIAGNOSIS — I70213 Atherosclerosis of native arteries of extremities with intermittent claudication, bilateral legs: Secondary | ICD-10-CM

## 2023-09-20 ENCOUNTER — Emergency Department (HOSPITAL_COMMUNITY): Payer: Medicare HMO

## 2023-09-20 ENCOUNTER — Inpatient Hospital Stay (HOSPITAL_COMMUNITY)
Admission: EM | Admit: 2023-09-20 | Discharge: 2023-10-31 | DRG: 871 | Disposition: E | Payer: Medicare HMO | Attending: Internal Medicine | Admitting: Internal Medicine

## 2023-09-20 ENCOUNTER — Encounter (HOSPITAL_COMMUNITY): Payer: Self-pay | Admitting: Emergency Medicine

## 2023-09-20 ENCOUNTER — Other Ambulatory Visit: Payer: Self-pay

## 2023-09-20 DIAGNOSIS — N390 Urinary tract infection, site not specified: Secondary | ICD-10-CM | POA: Diagnosis not present

## 2023-09-20 DIAGNOSIS — I469 Cardiac arrest, cause unspecified: Secondary | ICD-10-CM | POA: Diagnosis not present

## 2023-09-20 DIAGNOSIS — N179 Acute kidney failure, unspecified: Secondary | ICD-10-CM | POA: Diagnosis not present

## 2023-09-20 DIAGNOSIS — J690 Pneumonitis due to inhalation of food and vomit: Secondary | ICD-10-CM | POA: Diagnosis not present

## 2023-09-20 DIAGNOSIS — E785 Hyperlipidemia, unspecified: Secondary | ICD-10-CM | POA: Diagnosis present

## 2023-09-20 DIAGNOSIS — R627 Adult failure to thrive: Secondary | ICD-10-CM | POA: Diagnosis present

## 2023-09-20 DIAGNOSIS — D631 Anemia in chronic kidney disease: Secondary | ICD-10-CM | POA: Diagnosis present

## 2023-09-20 DIAGNOSIS — I5022 Chronic systolic (congestive) heart failure: Secondary | ICD-10-CM | POA: Diagnosis present

## 2023-09-20 DIAGNOSIS — J449 Chronic obstructive pulmonary disease, unspecified: Secondary | ICD-10-CM | POA: Diagnosis present

## 2023-09-20 DIAGNOSIS — I480 Paroxysmal atrial fibrillation: Secondary | ICD-10-CM | POA: Diagnosis present

## 2023-09-20 DIAGNOSIS — H9193 Unspecified hearing loss, bilateral: Secondary | ICD-10-CM | POA: Diagnosis present

## 2023-09-20 DIAGNOSIS — N136 Pyonephrosis: Secondary | ICD-10-CM | POA: Diagnosis present

## 2023-09-20 DIAGNOSIS — R578 Other shock: Secondary | ICD-10-CM | POA: Diagnosis not present

## 2023-09-20 DIAGNOSIS — I2510 Atherosclerotic heart disease of native coronary artery without angina pectoris: Secondary | ICD-10-CM | POA: Diagnosis present

## 2023-09-20 DIAGNOSIS — E872 Acidosis, unspecified: Secondary | ICD-10-CM | POA: Diagnosis present

## 2023-09-20 DIAGNOSIS — R531 Weakness: Secondary | ICD-10-CM | POA: Diagnosis not present

## 2023-09-20 DIAGNOSIS — J9601 Acute respiratory failure with hypoxia: Secondary | ICD-10-CM

## 2023-09-20 DIAGNOSIS — I3139 Other pericardial effusion (noninflammatory): Secondary | ICD-10-CM | POA: Diagnosis present

## 2023-09-20 DIAGNOSIS — N1832 Chronic kidney disease, stage 3b: Secondary | ICD-10-CM | POA: Diagnosis present

## 2023-09-20 DIAGNOSIS — A419 Sepsis, unspecified organism: Principal | ICD-10-CM | POA: Diagnosis present

## 2023-09-20 DIAGNOSIS — N3000 Acute cystitis without hematuria: Secondary | ICD-10-CM | POA: Diagnosis present

## 2023-09-20 DIAGNOSIS — R001 Bradycardia, unspecified: Secondary | ICD-10-CM | POA: Diagnosis not present

## 2023-09-20 DIAGNOSIS — T8383XA Hemorrhage of genitourinary prosthetic devices, implants and grafts, initial encounter: Secondary | ICD-10-CM | POA: Diagnosis not present

## 2023-09-20 DIAGNOSIS — I4892 Unspecified atrial flutter: Secondary | ICD-10-CM | POA: Diagnosis present

## 2023-09-20 DIAGNOSIS — Z79899 Other long term (current) drug therapy: Secondary | ICD-10-CM

## 2023-09-20 DIAGNOSIS — Z9981 Dependence on supplemental oxygen: Secondary | ICD-10-CM

## 2023-09-20 DIAGNOSIS — I7102 Dissection of abdominal aorta: Secondary | ICD-10-CM | POA: Diagnosis not present

## 2023-09-20 DIAGNOSIS — Z7951 Long term (current) use of inhaled steroids: Secondary | ICD-10-CM

## 2023-09-20 DIAGNOSIS — Z66 Do not resuscitate: Secondary | ICD-10-CM | POA: Diagnosis not present

## 2023-09-20 DIAGNOSIS — N170 Acute kidney failure with tubular necrosis: Secondary | ICD-10-CM | POA: Diagnosis present

## 2023-09-20 DIAGNOSIS — D72829 Elevated white blood cell count, unspecified: Secondary | ICD-10-CM

## 2023-09-20 DIAGNOSIS — Z515 Encounter for palliative care: Secondary | ICD-10-CM

## 2023-09-20 DIAGNOSIS — Z992 Dependence on renal dialysis: Secondary | ICD-10-CM

## 2023-09-20 DIAGNOSIS — Z87891 Personal history of nicotine dependence: Secondary | ICD-10-CM

## 2023-09-20 DIAGNOSIS — J9600 Acute respiratory failure, unspecified whether with hypoxia or hypercapnia: Secondary | ICD-10-CM | POA: Diagnosis not present

## 2023-09-20 DIAGNOSIS — D638 Anemia in other chronic diseases classified elsewhere: Secondary | ICD-10-CM | POA: Diagnosis present

## 2023-09-20 DIAGNOSIS — Z79890 Hormone replacement therapy (postmenopausal): Secondary | ICD-10-CM

## 2023-09-20 DIAGNOSIS — I080 Rheumatic disorders of both mitral and aortic valves: Secondary | ICD-10-CM | POA: Diagnosis not present

## 2023-09-20 DIAGNOSIS — Z7982 Long term (current) use of aspirin: Secondary | ICD-10-CM

## 2023-09-20 DIAGNOSIS — R319 Hematuria, unspecified: Secondary | ICD-10-CM

## 2023-09-20 DIAGNOSIS — I468 Cardiac arrest due to other underlying condition: Secondary | ICD-10-CM | POA: Diagnosis not present

## 2023-09-20 DIAGNOSIS — E669 Obesity, unspecified: Secondary | ICD-10-CM | POA: Diagnosis present

## 2023-09-20 DIAGNOSIS — J90 Pleural effusion, not elsewhere classified: Secondary | ICD-10-CM | POA: Diagnosis not present

## 2023-09-20 DIAGNOSIS — I7411 Embolism and thrombosis of thoracic aorta: Secondary | ICD-10-CM | POA: Diagnosis present

## 2023-09-20 DIAGNOSIS — Y846 Urinary catheterization as the cause of abnormal reaction of the patient, or of later complication, without mention of misadventure at the time of the procedure: Secondary | ICD-10-CM | POA: Diagnosis not present

## 2023-09-20 DIAGNOSIS — I252 Old myocardial infarction: Secondary | ICD-10-CM

## 2023-09-20 DIAGNOSIS — E7800 Pure hypercholesterolemia, unspecified: Secondary | ICD-10-CM | POA: Diagnosis present

## 2023-09-20 DIAGNOSIS — I6521 Occlusion and stenosis of right carotid artery: Secondary | ICD-10-CM | POA: Diagnosis present

## 2023-09-20 DIAGNOSIS — J189 Pneumonia, unspecified organism: Secondary | ICD-10-CM | POA: Diagnosis not present

## 2023-09-20 DIAGNOSIS — D75839 Thrombocytosis, unspecified: Secondary | ICD-10-CM | POA: Diagnosis not present

## 2023-09-20 DIAGNOSIS — I132 Hypertensive heart and chronic kidney disease with heart failure and with stage 5 chronic kidney disease, or end stage renal disease: Secondary | ICD-10-CM | POA: Diagnosis present

## 2023-09-20 DIAGNOSIS — K7200 Acute and subacute hepatic failure without coma: Secondary | ICD-10-CM | POA: Diagnosis present

## 2023-09-20 DIAGNOSIS — R6521 Severe sepsis with septic shock: Secondary | ICD-10-CM | POA: Diagnosis present

## 2023-09-20 DIAGNOSIS — E876 Hypokalemia: Secondary | ICD-10-CM | POA: Diagnosis present

## 2023-09-20 DIAGNOSIS — N186 End stage renal disease: Secondary | ICD-10-CM | POA: Diagnosis present

## 2023-09-20 DIAGNOSIS — E611 Iron deficiency: Secondary | ICD-10-CM | POA: Diagnosis present

## 2023-09-20 DIAGNOSIS — I7101 Dissection of ascending aorta: Secondary | ICD-10-CM | POA: Diagnosis not present

## 2023-09-20 DIAGNOSIS — B0089 Other herpesviral infection: Secondary | ICD-10-CM | POA: Diagnosis present

## 2023-09-20 DIAGNOSIS — I739 Peripheral vascular disease, unspecified: Secondary | ICD-10-CM | POA: Diagnosis present

## 2023-09-20 DIAGNOSIS — M109 Gout, unspecified: Secondary | ICD-10-CM | POA: Diagnosis present

## 2023-09-20 DIAGNOSIS — N1831 Chronic kidney disease, stage 3a: Secondary | ICD-10-CM | POA: Diagnosis present

## 2023-09-20 DIAGNOSIS — Z6836 Body mass index (BMI) 36.0-36.9, adult: Secondary | ICD-10-CM

## 2023-09-20 DIAGNOSIS — I5020 Unspecified systolic (congestive) heart failure: Secondary | ICD-10-CM | POA: Diagnosis present

## 2023-09-20 DIAGNOSIS — R54 Age-related physical debility: Secondary | ICD-10-CM | POA: Diagnosis present

## 2023-09-20 DIAGNOSIS — D649 Anemia, unspecified: Secondary | ICD-10-CM | POA: Insufficient documentation

## 2023-09-20 DIAGNOSIS — Z7189 Other specified counseling: Secondary | ICD-10-CM | POA: Diagnosis not present

## 2023-09-20 DIAGNOSIS — N320 Bladder-neck obstruction: Principal | ICD-10-CM | POA: Diagnosis present

## 2023-09-20 DIAGNOSIS — R34 Anuria and oliguria: Secondary | ICD-10-CM | POA: Diagnosis present

## 2023-09-20 DIAGNOSIS — Z87442 Personal history of urinary calculi: Secondary | ICD-10-CM

## 2023-09-20 DIAGNOSIS — N400 Enlarged prostate without lower urinary tract symptoms: Secondary | ICD-10-CM | POA: Diagnosis present

## 2023-09-20 DIAGNOSIS — Z7901 Long term (current) use of anticoagulants: Secondary | ICD-10-CM

## 2023-09-20 DIAGNOSIS — I10 Essential (primary) hypertension: Secondary | ICD-10-CM | POA: Diagnosis present

## 2023-09-20 DIAGNOSIS — R579 Shock, unspecified: Secondary | ICD-10-CM | POA: Diagnosis not present

## 2023-09-20 DIAGNOSIS — R739 Hyperglycemia, unspecified: Secondary | ICD-10-CM | POA: Diagnosis present

## 2023-09-20 LAB — CBC
HCT: 41.7 % (ref 39.0–52.0)
Hemoglobin: 13 g/dL (ref 13.0–17.0)
MCH: 28.9 pg (ref 26.0–34.0)
MCHC: 31.2 g/dL (ref 30.0–36.0)
MCV: 92.7 fL (ref 80.0–100.0)
Platelets: 245 10*3/uL (ref 150–400)
RBC: 4.5 MIL/uL (ref 4.22–5.81)
RDW: 14.8 % (ref 11.5–15.5)
WBC: 28.3 10*3/uL — ABNORMAL HIGH (ref 4.0–10.5)
nRBC: 0 % (ref 0.0–0.2)

## 2023-09-20 LAB — COMPREHENSIVE METABOLIC PANEL
ALT: 16 U/L (ref 0–44)
AST: 19 U/L (ref 15–41)
Albumin: 2.7 g/dL — ABNORMAL LOW (ref 3.5–5.0)
Alkaline Phosphatase: 105 U/L (ref 38–126)
Anion gap: 10 (ref 5–15)
BUN: 39 mg/dL — ABNORMAL HIGH (ref 8–23)
CO2: 26 mmol/L (ref 22–32)
Calcium: 8.1 mg/dL — ABNORMAL LOW (ref 8.9–10.3)
Chloride: 101 mmol/L (ref 98–111)
Creatinine, Ser: 2.26 mg/dL — ABNORMAL HIGH (ref 0.61–1.24)
GFR, Estimated: 27 mL/min — ABNORMAL LOW (ref >60)
Glucose, Bld: 131 mg/dL — ABNORMAL HIGH (ref 70–99)
Potassium: 3.5 mmol/L (ref 3.5–5.1)
Sodium: 137 mmol/L (ref 135–145)
Total Bilirubin: 1.4 mg/dL — ABNORMAL HIGH (ref 0.3–1.2)
Total Protein: 6.1 g/dL — ABNORMAL LOW (ref 6.5–8.1)

## 2023-09-20 LAB — URINALYSIS, ROUTINE W REFLEX MICROSCOPIC
Bilirubin Urine: NEGATIVE
Glucose, UA: NEGATIVE mg/dL
Ketones, ur: NEGATIVE mg/dL
Nitrite: POSITIVE — AB
Protein, ur: 100 mg/dL — AB
RBC / HPF: >50 RBC/hpf (ref 0–5)
Specific Gravity, Urine: 1.014 (ref 1.005–1.030)
WBC, UA: >50 WBC/hpf (ref 0–5)
pH: 8 (ref 5.0–8.0)

## 2023-09-20 LAB — LIPASE, BLOOD: Lipase: 27 U/L (ref 11–51)

## 2023-09-20 LAB — LACTIC ACID, PLASMA: Lactic Acid, Venous: 1.8 mmol/L (ref 0.5–1.9)

## 2023-09-20 MED ORDER — SODIUM CHLORIDE 0.9 % IV BOLUS
1000.0000 mL | Freq: Once | INTRAVENOUS | Status: AC
  Administered 2023-09-20: 1000 mL via INTRAVENOUS

## 2023-09-20 MED ORDER — LEVOTHYROXINE SODIUM 75 MCG PO TABS
75.0000 ug | ORAL_TABLET | Freq: Every day | ORAL | Status: DC
  Administered 2023-09-21: 75 ug via ORAL
  Filled 2023-09-20: qty 1

## 2023-09-20 MED ORDER — TAMSULOSIN HCL 0.4 MG PO CAPS
0.4000 mg | ORAL_CAPSULE | Freq: Every day | ORAL | Status: DC
  Administered 2023-09-20 – 2023-10-03 (×13): 0.4 mg via ORAL
  Filled 2023-09-20 (×13): qty 1

## 2023-09-20 MED ORDER — ASPIRIN 81 MG PO CHEW
81.0000 mg | CHEWABLE_TABLET | Freq: Every day | ORAL | Status: DC
  Administered 2023-09-21: 81 mg via ORAL
  Filled 2023-09-20: qty 1

## 2023-09-20 MED ORDER — APIXABAN 2.5 MG PO TABS
2.5000 mg | ORAL_TABLET | Freq: Two times a day (BID) | ORAL | Status: DC
  Administered 2023-09-20 – 2023-09-21 (×2): 2.5 mg via ORAL
  Filled 2023-09-20 (×2): qty 1

## 2023-09-20 MED ORDER — FUROSEMIDE 40 MG PO TABS
40.0000 mg | ORAL_TABLET | Freq: Every day | ORAL | Status: DC
  Administered 2023-09-21: 40 mg via ORAL
  Filled 2023-09-20: qty 1

## 2023-09-20 MED ORDER — FLUTICASONE FUROATE-VILANTEROL 200-25 MCG/ACT IN AEPB
1.0000 | INHALATION_SPRAY | Freq: Every day | RESPIRATORY_TRACT | Status: DC
  Administered 2023-09-21 – 2023-10-02 (×10): 1 via RESPIRATORY_TRACT
  Filled 2023-09-20 (×4): qty 28

## 2023-09-20 MED ORDER — ONDANSETRON HCL 4 MG PO TABS: 4.0000 mg | ORAL_TABLET | Freq: Four times a day (QID) | ORAL | Status: DC | PRN

## 2023-09-20 MED ORDER — SODIUM CHLORIDE 0.9 % IV SOLN
1.0000 g | Freq: Once | INTRAVENOUS | Status: AC
  Administered 2023-09-20: 1 g via INTRAVENOUS
  Filled 2023-09-20: qty 10

## 2023-09-20 MED ORDER — ATORVASTATIN CALCIUM 40 MG PO TABS: 80.0000 mg | ORAL_TABLET | Freq: Every day | ORAL | Status: DC

## 2023-09-20 MED ORDER — ALLOPURINOL 300 MG PO TABS: 300.0000 mg | ORAL_TABLET | Freq: Every day | ORAL | Status: DC

## 2023-09-20 MED ORDER — SODIUM CHLORIDE 0.9 % IV SOLN: 1.0000 g | INTRAVENOUS | Status: DC

## 2023-09-20 MED ORDER — AMIODARONE HCL 200 MG PO TABS: 200.0000 mg | ORAL_TABLET | ORAL | Status: DC

## 2023-09-20 MED ORDER — METOPROLOL SUCCINATE ER 50 MG PO TB24
100.0000 mg | ORAL_TABLET | Freq: Every morning | ORAL | Status: DC
  Administered 2023-09-21: 100 mg via ORAL
  Filled 2023-09-20 (×2): qty 2

## 2023-09-20 MED ORDER — ONDANSETRON HCL 4 MG/2ML IJ SOLN
4.0000 mg | Freq: Four times a day (QID) | INTRAMUSCULAR | Status: DC | PRN
  Administered 2023-09-21 – 2023-10-02 (×2): 4 mg via INTRAVENOUS
  Filled 2023-09-20 (×3): qty 2

## 2023-09-20 MED ORDER — FUROSEMIDE 10 MG/ML IJ SOLN
20.0000 mg | Freq: Once | INTRAMUSCULAR | Status: AC
  Administered 2023-09-20: 20 mg via INTRAVENOUS
  Filled 2023-09-20: qty 2

## 2023-09-20 MED ORDER — SODIUM CHLORIDE 0.9 % IV SOLN: INTRAVENOUS | Status: DC

## 2023-09-20 NOTE — ED Provider Notes (Signed)
[place] EMERGENCY DEPARTMENT AT [facility] Provider Note   CSN: [id number] Arrival date & time: 09/20/23  1113     History  Chief Complaint  Patient presents with   Abdominal Pain    Timothy Mata is a 89 y.o. male, history of COPD, kidney stones, BPH, who presents to the ED secondary to generalized abdominal pain, weakness, for the last week.  He states his abdomen hurts all over, comes and goes, and reports increased urinary frequency as well.  States that is gotten so bad that he decided to come to the ER.  Denies any fevers, chills, intractable vomiting.  Does note nausea, and wife states he has reduced p.o. intake.  Denies any penile discharge.    Home Medications Prior to Admission medications   Medication Sig Start Date End Date Taking? Authorizing Provider  allopurinol (ZYLOPRIM) 300 MG tablet Take 300 mg by mouth daily after supper.  06/12/15   [provider]  amiodarone (PACERONE) 200 MG tablet Take 200 mg Daily and none on Sunday 03/11/23   Marinus Maw, MD  apixaban (ELIQUIS) 2.5 MG TABS tablet TAKE 1 TABLET BY MOUTH TWICE A DAY 05/21/23   Pricilla Riffle, MD  aspirin 81 MG chewable tablet Chew 1 tablet (81 mg total) by mouth daily. 11/26/22   Rhyne, Ames Coupe, PA-C  atorvastatin (LIPITOR) 80 MG tablet Take 1 tablet (80 mg total) by mouth daily. 03/27/23 03/21/24  Pricilla Riffle, MD  Fluticasone-Salmeterol (ADVAIR) 250-50 MCG/DOSE AEPB Inhale 1 puff into the lungs 2 (two) times daily.    [provider]  furosemide (LASIX) 40 MG tablet Take 1 tablet (40 mg total) by mouth daily. 07/07/23 10/05/23  Pricilla Riffle, MD  levothyroxine (SYNTHROID) 75 MCG tablet Take 75 mcg by mouth daily before breakfast. 08/04/23 10/26/24  [provider]  metoprolol succinate (TOPROL-XL) 100 MG 24 hr tablet TAKE 1 TABLET (100 MG TOTAL) BY MOUTH IN THE MORNING. TAKE WITH OR IMMEDIATELY FOLLOWING A MEAL. 06/12/23 06/06/24  Pricilla Riffle, MD  nitroGLYCERIN (NITROSTAT)  0.4 MG SL tablet Place 1 tablet (0.4 mg total) under the tongue every 5 (five) minutes x 3 doses as needed for chest pain. Patient not taking: Reported on 08/27/2023 03/27/22   [place], Alvira Philips, DO  tamsulosin (FLOMAX) 0.4 MG CAPS capsule Take 1 capsule (0.4 mg total) by mouth daily after supper. 03/27/22   [place], Eric J, DO      Allergies    Patient has no known allergies.    Review of Systems   Review of Systems  Gastrointestinal:  Positive for abdominal pain and nausea. Negative for diarrhea and vomiting.    Physical Exam Updated Vital Signs BP (!) 183/70   Pulse 63   Temp 98.5 F (36.9 C)   Resp 20   Ht 5\' 10"  (1.778 m)   Wt 111.1 kg   SpO2 93%   BMI 35.15 kg/m  Physical Exam Vitals and nursing note reviewed.  Constitutional:      General: He is not in acute distress.    Appearance: He is well-developed.  HENT:     Head: Normocephalic and atraumatic.  Eyes:     Conjunctiva/sclera: Conjunctivae normal.  Cardiovascular:     Rate and Rhythm: Normal rate and regular rhythm.     Heart sounds: No murmur heard. Pulmonary:     Effort: Pulmonary effort is normal. No respiratory distress.     Breath sounds: Normal breath sounds.  Abdominal:  Palpations: Abdomen is soft.     Tenderness: There is generalized abdominal tenderness. There is no guarding.  Musculoskeletal:        General: No swelling.     Cervical back: Neck supple.  Skin:    General: Skin is warm and dry.     Capillary Refill: Capillary refill takes less than 2 seconds.  Neurological:     Mental Status: He is alert.  Psychiatric:        Mood and Affect: Mood normal.     ED Results / Procedures / Treatments   Labs (all labs ordered are listed, but only abnormal results are displayed) Labs Reviewed  COMPREHENSIVE METABOLIC PANEL - Abnormal; Notable for the following components:      Result Value   Glucose, Bld 131 (*)    BUN 39 (*)    Creatinine, Ser 2.26 (*)    Calcium 8.1 (*)    Total Protein  6.1 (*)    Albumin 2.7 (*)    Total Bilirubin 1.4 (*)    GFR, Estimated 27 (*)    All other components within normal limits  CBC - Abnormal; Notable for the following components:   WBC 28.3 (*)    All other components within normal limits  URINALYSIS, ROUTINE W REFLEX MICROSCOPIC - Abnormal; Notable for the following components:   Color, Urine AMBER (*)    APPearance CLOUDY (*)    Hgb urine dipstick MODERATE (*)    Protein, ur 100 (*)    Nitrite POSITIVE (*)    Leukocytes,Ua LARGE (*)    Bacteria, UA MANY (*)    All other components within normal limits  CULTURE, BLOOD (ROUTINE X 2)  CULTURE, BLOOD (ROUTINE X 2)  LIPASE, BLOOD  LACTIC ACID, PLASMA    EKG None  Radiology DG Chest 2 View  Result Date: 09/20/2023 CLINICAL DATA:  weakness. EXAM: CHEST - 2 VIEW COMPARISON:  07/04/2022. FINDINGS: There are probable bilateral layering pleural effusions. Mild pulmonary vascular congestion. No acute consolidation or lung collapse. Moderately enlarged cardio-mediastinal silhouette. No acute osseous abnormalities. The soft tissues are within normal limits. IMPRESSION: *Cardiomegaly, mild pulmonary vascular congestion and probable bilateral layering pleural effusions, favoring congestive heart failure/pulmonary edema. Electronically Signed   By: Jules Schick M.D.   On: 09/20/2023 16:26   CT ABDOMEN PELVIS WO CONTRAST  Result Date: 09/20/2023 CLINICAL DATA:  Abdominal pain, acute, nonlocalized +UTI, WBC of 28k. EXAM: CT ABDOMEN AND PELVIS WITHOUT CONTRAST TECHNIQUE: Multidetector CT imaging of the abdomen and pelvis was performed following the standard protocol without IV contrast. RADIATION DOSE REDUCTION: This exam was performed according to the departmental dose-optimization program which includes automated exposure control, adjustment of the mA and/or kV according to patient size and/or use of iterative reconstruction technique. COMPARISON:  CT scan abdomen and pelvis report from  08/03/2013. Please note, images are not available for review. FINDINGS: Lower chest: The lung bases are clear. There are bilateral Kathey Simer pleural effusions. The heart is normal in size. No pericardial effusion. Hepatobiliary: The liver is normal in size. Non-cirrhotic configuration. No suspicious mass. No intrahepatic or extrahepatic bile duct dilation. No calcified gallstones. Normal gallbladder wall thickness. No pericholecystic inflammatory changes. Pancreas: Unremarkable. No pancreatic ductal dilatation or surrounding inflammatory changes. Spleen: Within normal limits. No focal lesion. Adrenals/Urinary Tract: Adrenal glands are unremarkable. No suspicious renal mass within the limitations of this unenhanced exam. Bilateral kidneys exhibit mild diffuse cortical atrophy. Bilateral extrarenal pelvis noted. There is mild left hydronephrosis and entire hydroureter  without discrete obstructing mass or ureterolithiasis. Findings are likely secondary to chronic vesicoureteric reflux. No right hydroureteronephrosis. Diffuse trabeculations and several wide necked bladder diverticula noted, suggesting sequela of chronic urinary outflow obstruction. Stomach/Bowel: No disproportionate dilation of the Chandlor Noecker or large bowel loops. No evidence of abnormal bowel wall thickening or inflammatory changes. The appendix is unremarkable. Vascular/Lymphatic: No ascites or pneumoperitoneum. No abdominal or pelvic lymphadenopathy, by size criteria. No aneurysmal dilation of the major abdominal arteries. There are moderate peripheral atherosclerotic vascular calcifications of the aorta and its major branches. Reproductive: Enlarged prostate. Symmetric seminal vesicles. Other: There are fat containing umbilical and bilateral inguinal hernias. The soft tissues and abdominal wall are otherwise unremarkable. Musculoskeletal: No suspicious osseous lesions. Multiple old healed posteromedial fractures of right lower ribs as well as multiple rib  ORIF noted. There are mild multilevel degenerative changes in the visualized spine. IMPRESSION: 1. No acute inflammatory process identified within the abdomen or pelvis. 2. Mild left hydroureteronephrosis without obstructing mass or ureterolithiasis. Findings are likely secondary to chronic vesicoureteric reflux. Enlarged prostate and bladder wall trabeculations/diverticula. 3. Bilateral Kyran Connaughton pleural effusions. 4. Multiple other nonacute observations, as described above. Electronically Signed   By: Jules Schick M.D.   On: 09/20/2023 16:24    Procedures Procedures    Medications Ordered in ED Medications  furosemide (LASIX) injection 20 mg (has no administration in time range)  cefTRIAXone (ROCEPHIN) 1 g in sodium chloride 0.9 % 100 mL IVPB (has no administration in time range)  cefTRIAXone (ROCEPHIN) 1 g in sodium chloride 0.9 % 100 mL IVPB (0 g Intravenous Stopped 09/20/23 1714)  sodium chloride 0.9 % bolus 1,000 mL (0 mLs Intravenous Stopped 09/20/23 1714)    ED Course/ Medical Decision Making/ A&P                                 Medical Decision Making Patient is an 89 year old male, here for diffuse abdominal pain that is been going on for about a week, as well as increased urinary frequency.  We will obtain a CT abdomen pelvis given the diffuse abdominal pain as well as urinalysis, and blood work.  Denies any chest pain, or shortness of breath  Amount and/or Complexity of Data Reviewed Labs: ordered.    Details: Leukocytosis of 28,000, many bacteria in urine Radiology: ordered.    Details: Mild left hydroureteronephrosis, without obstructing mass, findings concerning for  bladder outlet syndrome Discussion of management or test interpretation with external provider(s): Patient is an 89 year old male, found to have UTI, chest x-ray shows of volume overload this may be chronic in nature, as he has no respiratory complaints.  We stopped IV fluids, started Lasix, and anchored a Foley  given his bladder outlet syndrome.  Discussed with Dr. Adrian Blackwater, he accepts admission of patient.  Patient hospitalized given weakness, UTI, white count.  Need for urology recommendation  Risk Prescription drug management. Decision regarding hospitalization.    Final Clinical Impression(s) / ED Diagnoses Final diagnoses:  Bladder outlet obstruction  Acute cystitis without hematuria    Rx / DC Orders ED Discharge Orders     None         Odyn Turko, Harley Alto, PA 09/20/23 1719    Eber Hong, MD 09/22/23 [phone]

## 2023-09-20 NOTE — Progress Notes (Signed)
   09/20/23 1800  Vitals  Temp 98.2 F (36.8 C)  Temp Source Oral  BP (!) 176/65  MAP (mmHg) 95  BP Location Right Arm  BP Method Automatic  Patient Position (if appropriate) Lying  Pulse Rate 64  Pulse Rate Source Dinamap  Resp 19  MEWS COLOR  MEWS Score Color Green  Oxygen Therapy  SpO2 97 %  O2 Device Room Air  Height and Weight  Height 5\' 10"  (1.778 m)  Weight 110.7 kg  BSA (Calculated - sq m) 2.34 sq meters  BMI (Calculated) 35.02  Weight in (lb) to have BMI = 25 173.9  MEWS Score  MEWS Temp 0  MEWS Systolic 0  MEWS Pulse 0  MEWS RR 0  MEWS LOC 0  MEWS Score 0   New admission from ED, AAOx4 wife by the bedside. Oriented to room and call bell.Dinner ordered.

## 2023-09-20 NOTE — ED Triage Notes (Signed)
Pt presents with abdominal pain for over week with urinary frequency.

## 2023-09-20 NOTE — ED Notes (Signed)
Report called to primary nurse on med surg

## 2023-09-20 NOTE — H&P (Signed)
History and Physical    PatientMarland Kitchen Carrel Mata NWG:[id number] DOB: 13-Mar-1933 DOA: 09/29/2023 DOS: the patient was seen and examined on 09/04/2023 PCP: Royann Shivers, PA-C  Patient coming from: Home  Chief Complaint:  Chief Complaint  Patient presents with   Abdominal Pain   HPI: Timothy Mata is a 88 y.o. male with medical history significant of Aflutter on eliquis and amiodarone, BPH, COPD, HTN.  Patient seen for 1 week of abdominal pain that has been increasing since it started last week.  The pain became really intense today and he had to come to the hospital for evaluation.  He denies fevers, but has had rigors at home.  These have been intermittent.  Denies nausea, vomiting, diarrhea.  He denies back or flank pain. He does have frequency and has been urinating about every hour for the past week, even at night.   Review of Systems: As mentioned in the history of present illness. All other systems reviewed and are negative. Past Medical History:  Diagnosis Date   Atrial flutter (HCC)    a. s/p DCCV in 03/2022 b. repeat EKG at follow-up from DCCV showing atrial fibrillation   BPH (benign prostatic hyperplasia)    COPD (chronic obstructive pulmonary disease) (HCC)    Dysrhythmia    Gout    "left big toe"   History of kidney stones    HOH (hard of hearing)    Hypercholesterolemia    Hypertension    Preretinal fibrosis, left eye    Spinal headache 12/30/1972   Past Surgical History:  Procedure Laterality Date   25 GAUGE PARS PLANA VITRECTOMY WITH 20 GAUGE MVR PORT Left 07/18/2015   Procedure: 25 GAUGE PARS PLANA VITRECTOMY WITH 20 GAUGE MVR PORT;  Surgeon: Sherrie George, MD;  Location: [facility] OR;  Service: Ophthalmology;  Laterality: Left;   AIR/FLUID EXCHANGE Left 07/18/2015   Procedure: AIR/FLUID EXCHANGE;  Surgeon: Sherrie George, MD;  Location: [facility] OR;  Service: Ophthalmology;  Laterality: Left;   BACK SURGERY     CARDIOVERSION N/A 04/26/2022   Procedure: CARDIOVERSION;   Surgeon: Jonelle Sidle, MD;  Location: [facility] ORS;  Service: Cardiovascular;  Laterality: N/A;   ENDARTERECTOMY Right 11/25/2022   Procedure: ENDARTERECTOMY CAROTID WITH PATCH ANGIOPLASTY;  Surgeon: Cephus Shelling, MD;  Location: [facility] OR;  Service: Vascular;  Laterality: Right;   EYE SURGERY     FOOT FRACTURE SURGERY Right 1981   LASER PHOTO ABLATION Left 07/18/2015   Procedure: LASER PHOTO ABLATION;  Surgeon: Sherrie George, MD;  Location: [facility] OR;  Service: Ophthalmology;  Laterality: Left;  head scope    LUMBAR DISC SURGERY  1974   MEMBRANE PEEL Left 07/18/2015   Procedure: MEMBRANE PEEL;  Surgeon: Sherrie George, MD;  Location: [facility] OR;  Service: Ophthalmology;  Laterality: Left;   MULTIPLE TOOTH EXTRACTIONS  1990's   PARS PLANA VITRECTOMY Left 07/18/2015   PATCH ANGIOPLASTY Right 11/25/2022   Procedure: PATCH ANGIOPLASTY WITH 1 cm X 6 cm XENOSURE BIOLOGIC PATCH;  Surgeon: Cephus Shelling, MD;  Location: [facility] OR;  Service: Vascular;  Laterality: Right;   Social History:  reports that he has quit smoking. His smoking use included cigarettes. He has a 32 pack-year smoking history. He has never used smokeless tobacco. He reports that he does not drink alcohol and does not use drugs.  No Known Allergies  Family History  Problem Relation Age of Onset   Cancer Father     Prior to Admission medications  Medication Sig Start Date End Date Taking? Authorizing Provider  allopurinol (ZYLOPRIM) 300 MG tablet Take 300 mg by mouth daily after supper.  06/12/15   [provider]  amiodarone (PACERONE) 200 MG tablet Take 200 mg Daily and none on Sunday 03/11/23   Marinus Maw, MD  apixaban (ELIQUIS) 2.5 MG TABS tablet TAKE 1 TABLET BY MOUTH TWICE A DAY 05/21/23   Pricilla Riffle, MD  aspirin 81 MG chewable tablet Chew 1 tablet (81 mg total) by mouth daily. 11/26/22   Rhyne, Ames Coupe, PA-C  atorvastatin (LIPITOR) 80 MG tablet Take 1 tablet (80 mg total) by mouth daily. 03/27/23 03/21/24   Pricilla Riffle, MD  Fluticasone-Salmeterol (ADVAIR) 250-50 MCG/DOSE AEPB Inhale 1 puff into the lungs 2 (two) times daily.    [provider]  furosemide (LASIX) 40 MG tablet Take 1 tablet (40 mg total) by mouth daily. 07/07/23 10/05/23  Pricilla Riffle, MD  levothyroxine (SYNTHROID) 75 MCG tablet Take 75 mcg by mouth daily before breakfast. 08/04/23 10/26/24  [provider]  metoprolol succinate (TOPROL-XL) 100 MG 24 hr tablet TAKE 1 TABLET (100 MG TOTAL) BY MOUTH IN THE MORNING. TAKE WITH OR IMMEDIATELY FOLLOWING A MEAL. 06/12/23 06/06/24  Pricilla Riffle, MD  nitroGLYCERIN (NITROSTAT) 0.4 MG SL tablet Place 1 tablet (0.4 mg total) under the tongue every 5 (five) minutes x 3 doses as needed for chest pain. Patient not taking: Reported on 08/27/2023 03/27/22   [place], Alvira Philips, DO  tamsulosin (FLOMAX) 0.4 MG CAPS capsule Take 1 capsule (0.4 mg total) by mouth daily after supper. 03/27/22   [place], Eric J, DO    Physical Exam: Vitals:   2023-10-05 1243 Oct 05, 2023 1550 October 05, 2023 1600 10-05-23 1714  BP: (!) 156/62 (!) 148/61 (!) 157/60 (!) 183/70  Pulse: 69 65 65 63  Resp: (!) 24 (!) 22 20 20   Temp: 97.9 F (36.6 C)  98 F (36.7 C) 98.5 F (36.9 C)  SpO2: 92% 94% 94% 93%  Weight:      Height:       General: Elderly male. Awake and alert and oriented x3. No acute cardiopulmonary distress.  HEENT: Normocephalic atraumatic.  Right and left ears normal in appearance.  Pupils equal, round, reactive to light. Extraocular muscles are intact. Sclerae anicteric and noninjected.  Moist mucosal membranes. No mucosal lesions.  Neck: Neck supple without lymphadenopathy. No carotid bruits. No masses palpated.  Cardiovascular: Regular rate with normal S1-S2 sounds. No murmurs, rubs, gallops auscultated. No JVD.  Respiratory: Good respiratory effort with no wheezes, rales, rhonchi. Lungs clear to auscultation bilaterally.  No accessory muscle use. Abdomen: Soft, mild tenderness in the lower abdomen,  nondistended. Active bowel sounds. No masses or hepatosplenomegaly  Skin: No rashes, lesions, or ulcerations.  Dry, warm to touch. 2+ dorsalis pedis and radial pulses. Musculoskeletal: No calf or leg pain. All major joints not erythematous nontender.  No upper or lower joint deformation.  Good ROM.  No contractures  Psychiatric: Intact judgment and insight. Pleasant and cooperative. Neurologic: No focal neurological deficits. Strength is 5/5 and symmetric in upper and lower extremities.  Cranial nerves II through XII are grossly intact.  Data Reviewed: Results for orders placed or performed during the hospital encounter of 2023/10/05 (from the past 24 hour(s))  Urinalysis, Routine w reflex microscopic -Urine, Clean Catch     Status: Abnormal   Collection Time: 05-Oct-2023 12:43 PM  Result Value Ref Range   Color, Urine AMBER (A) YELLOW  APPearance CLOUDY (A) CLEAR   Specific Gravity, Urine 1.014 1.005 - 1.030   pH 8.0 5.0 - 8.0   Glucose, UA NEGATIVE NEGATIVE mg/dL   Hgb urine dipstick MODERATE (A) NEGATIVE   Bilirubin Urine NEGATIVE NEGATIVE   Ketones, ur NEGATIVE NEGATIVE mg/dL   Protein, ur 829 (A) NEGATIVE mg/dL   Nitrite POSITIVE (A) NEGATIVE   Leukocytes,Ua LARGE (A) NEGATIVE   RBC / HPF >50 0 - 5 RBC/hpf   WBC, UA >50 0 - 5 WBC/hpf   Bacteria, UA MANY (A) NONE SEEN   Squamous Epithelial / HPF 0-5 0 - 5 /HPF   WBC Clumps PRESENT   Lipase, blood     Status: None   Collection Time: 09/03/2023  1:19 PM  Result Value Ref Range   Lipase 27 11 - 51 U/L  Comprehensive metabolic panel     Status: Abnormal   Collection Time: 09/18/2023  1:19 PM  Result Value Ref Range   Sodium 137 135 - 145 mmol/L   Potassium 3.5 3.5 - 5.1 mmol/L   Chloride 101 98 - 111 mmol/L   CO2 26 22 - 32 mmol/L   Glucose, Bld 131 (H) 70 - 99 mg/dL   BUN 39 (H) 8 - 23 mg/dL   Creatinine, Ser 5.62 (H) 0.61 - 1.24 mg/dL   Calcium 8.1 (L) 8.9 - 10.3 mg/dL   Total Protein 6.1 (L) 6.5 - 8.1 g/dL   Albumin 2.7 (L) 3.5  - 5.0 g/dL   AST 19 15 - 41 U/L   ALT 16 0 - 44 U/L   Alkaline Phosphatase 105 38 - 126 U/L   Total Bilirubin 1.4 (H) 0.3 - 1.2 mg/dL   GFR, Estimated 27 (L) >60 mL/min   Anion gap 10 5 - 15  CBC     Status: Abnormal   Collection Time: 09/02/2023  1:19 PM  Result Value Ref Range   WBC 28.3 (H) 4.0 - 10.5 K/uL   RBC 4.50 4.22 - 5.81 MIL/uL   Hemoglobin 13.0 13.0 - 17.0 g/dL   HCT 13.0 86.5 - 78.4 %   MCV 92.7 80.0 - 100.0 fL   MCH 28.9 26.0 - 34.0 pg   MCHC 31.2 30.0 - 36.0 g/dL   RDW 69.6 29.5 - 28.4 %   Platelets 245 150 - 400 K/uL   nRBC 0.0 0.0 - 0.2 %  Blood culture (routine x 2)     Status: None (Preliminary result)   Collection Time: 09/06/2023  4:07 PM   Specimen: BLOOD LEFT HAND  Result Value Ref Range   Specimen Description      BLOOD LEFT HAND BOTTLES DRAWN AEROBIC AND ANAEROBIC   Special Requests      Blood Culture adequate volume Performed at [facility], [address], [place], [place] [address]    Culture PENDING    Report Status PENDING   Blood culture (routine x 2)     Status: None (Preliminary result)   Collection Time: 09/27/2023  4:07 PM   Specimen: Right Antecubital; Blood  Result Value Ref Range   Specimen Description      RIGHT ANTECUBITAL BOTTLES DRAWN AEROBIC AND ANAEROBIC   Special Requests      Blood Culture adequate volume Performed at [facility], [address], [place], [place] [address]    Culture PENDING    Report Status PENDING   Lactic acid, plasma     Status: None   Collection Time: 09/12/2023  4:07 PM  Result Value  Ref Range   Lactic Acid, Venous 1.8 0.5 - 1.9 mmol/L    DG Chest 2 View  Result Date: 2023-10-02 CLINICAL DATA:  weakness. EXAM: CHEST - 2 VIEW COMPARISON:  07/04/2022. FINDINGS: There are probable bilateral layering pleural effusions. Mild pulmonary vascular congestion. No acute consolidation or lung collapse. Moderately enlarged cardio-mediastinal silhouette. No acute osseous abnormalities. The soft tissues are within  normal limits. IMPRESSION: *Cardiomegaly, mild pulmonary vascular congestion and probable bilateral layering pleural effusions, favoring congestive heart failure/pulmonary edema. Electronically Signed   By: Jules Schick M.D.   On: 2023-10-02 16:26   CT ABDOMEN PELVIS WO CONTRAST  Result Date: 2023/10/02 CLINICAL DATA:  Abdominal pain, acute, nonlocalized +UTI, WBC of 28k. EXAM: CT ABDOMEN AND PELVIS WITHOUT CONTRAST TECHNIQUE: Multidetector CT imaging of the abdomen and pelvis was performed following the standard protocol without IV contrast. RADIATION DOSE REDUCTION: This exam was performed according to the departmental dose-optimization program which includes automated exposure control, adjustment of the mA and/or kV according to patient size and/or use of iterative reconstruction technique. COMPARISON:  CT scan abdomen and pelvis report from 08/03/2013. Please note, images are not available for review. FINDINGS: Lower chest: The lung bases are clear. There are bilateral small pleural effusions. The heart is normal in size. No pericardial effusion. Hepatobiliary: The liver is normal in size. Non-cirrhotic configuration. No suspicious mass. No intrahepatic or extrahepatic bile duct dilation. No calcified gallstones. Normal gallbladder wall thickness. No pericholecystic inflammatory changes. Pancreas: Unremarkable. No pancreatic ductal dilatation or surrounding inflammatory changes. Spleen: Within normal limits. No focal lesion. Adrenals/Urinary Tract: Adrenal glands are unremarkable. No suspicious renal mass within the limitations of this unenhanced exam. Bilateral kidneys exhibit mild diffuse cortical atrophy. Bilateral extrarenal pelvis noted. There is mild left hydronephrosis and entire hydroureter without discrete obstructing mass or ureterolithiasis. Findings are likely secondary to chronic vesicoureteric reflux. No right hydroureteronephrosis. Diffuse trabeculations and several wide necked bladder  diverticula noted, suggesting sequela of chronic urinary outflow obstruction. Stomach/Bowel: No disproportionate dilation of the small or large bowel loops. No evidence of abnormal bowel wall thickening or inflammatory changes. The appendix is unremarkable. Vascular/Lymphatic: No ascites or pneumoperitoneum. No abdominal or pelvic lymphadenopathy, by size criteria. No aneurysmal dilation of the major abdominal arteries. There are moderate peripheral atherosclerotic vascular calcifications of the aorta and its major branches. Reproductive: Enlarged prostate. Symmetric seminal vesicles. Other: There are fat containing umbilical and bilateral inguinal hernias. The soft tissues and abdominal wall are otherwise unremarkable. Musculoskeletal: No suspicious osseous lesions. Multiple old healed posteromedial fractures of right lower ribs as well as multiple rib ORIF noted. There are mild multilevel degenerative changes in the visualized spine. IMPRESSION: 1. No acute inflammatory process identified within the abdomen or pelvis. 2. Mild left hydroureteronephrosis without obstructing mass or ureterolithiasis. Findings are likely secondary to chronic vesicoureteric reflux. Enlarged prostate and bladder wall trabeculations/diverticula. 3. Bilateral small pleural effusions. 4. Multiple other nonacute observations, as described above. Electronically Signed   By: Jules Schick M.D.   On: 10-02-23 16:24     Assessment and Plan: No notes have been filed under this hospital service. Service: Hospitalist  Principal Problem:   Sepsis secondary to UTI [facility]) Active Problems:   Essential hypertension   Benign prostatic hyperplasia   HFrEF (heart failure with reduced ejection fraction) (HCC)   COPD (chronic obstructive pulmonary disease) (HCC)   Stage 3a chronic kidney disease (CKD) (HCC)  Sepsis secondary to UTI Urine culture, blood cultures Rocephin CBC tomorrow Afib/flutter Continue amiodarone and  anticoagulatoin HTN Continue antihypertensives BPH Continue Flomax HFrER Lasix daily COPD Stable Continue ICS/LABA CKD 3a Slight worsening of creatinine which is likely due to the obstructive nephropathy.  Will give gentle fluids, but be cautious to not fluid overload.   Advance Care Planning:   Code Status: Full Code  Consults: none  Family Communication: wife present, who gives additional history  Severity of Illness: The appropriate patient status for this patient is INPATIENT. Inpatient status is judged to be reasonable and necessary in order to provide the required intensity of service to ensure the patient's safety. The patient's presenting symptoms, physical exam findings, and initial radiographic and laboratory data in the context of their chronic comorbidities is felt to place them at high risk for further clinical deterioration. Furthermore, it is not anticipated that the patient will be medically stable for discharge from the hospital within 2 midnights of admission.   * I certify that at the point of admission it is my clinical judgment that the patient will require inpatient hospital care spanning beyond 2 midnights from the point of admission due to high intensity of service, high risk for further deterioration and high frequency of surveillance required.*  Author: Levie Heritage, DO Oct 02, 2023 5:40 PM  For on call review [email or web address].

## 2023-09-21 ENCOUNTER — Inpatient Hospital Stay (HOSPITAL_COMMUNITY): Payer: Medicare HMO

## 2023-09-21 ENCOUNTER — Other Ambulatory Visit: Payer: Self-pay

## 2023-09-21 DIAGNOSIS — A419 Sepsis, unspecified organism: Secondary | ICD-10-CM | POA: Diagnosis not present

## 2023-09-21 DIAGNOSIS — J9600 Acute respiratory failure, unspecified whether with hypoxia or hypercapnia: Secondary | ICD-10-CM | POA: Diagnosis not present

## 2023-09-21 DIAGNOSIS — N390 Urinary tract infection, site not specified: Secondary | ICD-10-CM

## 2023-09-21 DIAGNOSIS — R6521 Severe sepsis with septic shock: Secondary | ICD-10-CM | POA: Diagnosis not present

## 2023-09-21 DIAGNOSIS — I469 Cardiac arrest, cause unspecified: Secondary | ICD-10-CM | POA: Diagnosis not present

## 2023-09-21 DIAGNOSIS — J9601 Acute respiratory failure with hypoxia: Secondary | ICD-10-CM

## 2023-09-21 DIAGNOSIS — R579 Shock, unspecified: Secondary | ICD-10-CM | POA: Insufficient documentation

## 2023-09-21 LAB — CBC
HCT: 36 % — ABNORMAL LOW (ref 39.0–52.0)
HCT: 38.1 % — ABNORMAL LOW (ref 39.0–52.0)
HCT: 34.7 % — ABNORMAL LOW (ref 39.0–52.0)
Hemoglobin: 11.6 g/dL — ABNORMAL LOW (ref 13.0–17.0)
Hemoglobin: 12.3 g/dL — ABNORMAL LOW (ref 13.0–17.0)
Hemoglobin: 10.9 g/dL — ABNORMAL LOW (ref 13.0–17.0)
MCH: 29.4 pg (ref 26.0–34.0)
MCH: 29.7 pg (ref 26.0–34.0)
MCH: 29.6 pg (ref 26.0–34.0)
MCHC: 32.2 g/dL (ref 30.0–36.0)
MCHC: 32.3 g/dL (ref 30.0–36.0)
MCHC: 31.4 g/dL (ref 30.0–36.0)
MCV: 91.4 fL (ref 80.0–100.0)
MCV: 92 fL (ref 80.0–100.0)
MCV: 94.3 fL (ref 80.0–100.0)
Platelets: 246 10*3/uL (ref 150–400)
Platelets: 248 10*3/uL (ref 150–400)
Platelets: 250 10*3/uL (ref 150–400)
RBC: 3.94 MIL/uL — ABNORMAL LOW (ref 4.22–5.81)
RBC: 4.14 MIL/uL — ABNORMAL LOW (ref 4.22–5.81)
RBC: 3.68 MIL/uL — ABNORMAL LOW (ref 4.22–5.81)
RDW: 14.9 % (ref 11.5–15.5)
RDW: 15 % (ref 11.5–15.5)
RDW: 15 % (ref 11.5–15.5)
WBC: 22.2 10*3/uL — ABNORMAL HIGH (ref 4.0–10.5)
WBC: 32.2 10*3/uL — ABNORMAL HIGH (ref 4.0–10.5)
WBC: 37.6 10*3/uL — ABNORMAL HIGH (ref 4.0–10.5)
nRBC: 0 % (ref 0.0–0.2)
nRBC: 0 % (ref 0.0–0.2)
nRBC: 0.1 % (ref 0.0–0.2)

## 2023-09-21 LAB — BASIC METABOLIC PANEL
Anion gap: 11 (ref 5–15)
Anion gap: 11 (ref 5–15)
BUN: 35 mg/dL — ABNORMAL HIGH (ref 8–23)
BUN: 33 mg/dL — ABNORMAL HIGH (ref 8–23)
CO2: 23 mmol/L (ref 22–32)
CO2: 22 mmol/L (ref 22–32)
Calcium: 7.6 mg/dL — ABNORMAL LOW (ref 8.9–10.3)
Calcium: 7.8 mg/dL — ABNORMAL LOW (ref 8.9–10.3)
Chloride: 103 mmol/L (ref 98–111)
Chloride: 103 mmol/L (ref 98–111)
Creatinine, Ser: 1.95 mg/dL — ABNORMAL HIGH (ref 0.61–1.24)
Creatinine, Ser: 1.94 mg/dL — ABNORMAL HIGH (ref 0.61–1.24)
GFR, Estimated: 33 mL/min — ABNORMAL LOW (ref >60)
GFR, Estimated: 33 mL/min — ABNORMAL LOW (ref >60)
Glucose, Bld: 100 mg/dL — ABNORMAL HIGH (ref 70–99)
Glucose, Bld: 135 mg/dL — ABNORMAL HIGH (ref 70–99)
Potassium: 3.5 mmol/L (ref 3.5–5.1)
Potassium: 3.4 mmol/L — ABNORMAL LOW (ref 3.5–5.1)
Sodium: 137 mmol/L (ref 135–145)
Sodium: 136 mmol/L (ref 135–145)

## 2023-09-21 LAB — URINALYSIS, W/ REFLEX TO CULTURE (INFECTION SUSPECTED)
Bilirubin Urine: NEGATIVE
Glucose, UA: NEGATIVE mg/dL
Ketones, ur: NEGATIVE mg/dL
Nitrite: NEGATIVE
Protein, ur: 100 mg/dL — AB
RBC / HPF: >50 RBC/hpf (ref 0–5)
Specific Gravity, Urine: 1.019 (ref 1.005–1.030)
WBC, UA: >50 WBC/hpf (ref 0–5)
pH: 6 (ref 5.0–8.0)

## 2023-09-21 LAB — POCT I-STAT 7, (LYTES, BLD GAS, ICA,H+H)
Acid-base deficit: 4 mmol/L — ABNORMAL HIGH (ref 0.0–2.0)
Bicarbonate: 20.1 mmol/L (ref 20.0–28.0)
Calcium, Ion: 1.03 mmol/L — ABNORMAL LOW (ref 1.15–1.40)
HCT: 34 % — ABNORMAL LOW (ref 39.0–52.0)
Hemoglobin: 11.6 g/dL — ABNORMAL LOW (ref 13.0–17.0)
O2 Saturation: 100 %
Patient temperature: 99
Potassium: 4.4 mmol/L (ref 3.5–5.1)
Sodium: 134 mmol/L — ABNORMAL LOW (ref 135–145)
TCO2: 21 mmol/L — ABNORMAL LOW (ref 22–32)
pCO2 arterial: 34.6 mmHg (ref 32–48)
pH, Arterial: 7.373 (ref 7.35–7.45)
pO2, Arterial: 215 mmHg — ABNORMAL HIGH (ref 83–108)

## 2023-09-21 LAB — COMPREHENSIVE METABOLIC PANEL
ALT: 75 U/L — ABNORMAL HIGH (ref 0–44)
AST: 102 U/L — ABNORMAL HIGH (ref 15–41)
Albumin: 2.1 g/dL — ABNORMAL LOW (ref 3.5–5.0)
Alkaline Phosphatase: 96 U/L (ref 38–126)
Anion gap: 13 (ref 5–15)
BUN: 36 mg/dL — ABNORMAL HIGH (ref 8–23)
CO2: 20 mmol/L — ABNORMAL LOW (ref 22–32)
Calcium: 7.1 mg/dL — ABNORMAL LOW (ref 8.9–10.3)
Chloride: 102 mmol/L (ref 98–111)
Creatinine, Ser: 2.3 mg/dL — ABNORMAL HIGH (ref 0.61–1.24)
GFR, Estimated: 27 mL/min — ABNORMAL LOW (ref >60)
Glucose, Bld: 186 mg/dL — ABNORMAL HIGH (ref 70–99)
Potassium: 3.9 mmol/L (ref 3.5–5.1)
Sodium: 135 mmol/L (ref 135–145)
Total Bilirubin: 1.8 mg/dL — ABNORMAL HIGH (ref 0.3–1.2)
Total Protein: 5.1 g/dL — ABNORMAL LOW (ref 6.5–8.1)

## 2023-09-21 LAB — MRSA NEXT GEN BY PCR, NASAL
MRSA by PCR Next Gen: NOT DETECTED
MRSA by PCR Next Gen: NOT DETECTED

## 2023-09-21 LAB — GLUCOSE, CAPILLARY
Glucose-Capillary: 112 mg/dL — ABNORMAL HIGH (ref 70–99)
Glucose-Capillary: 167 mg/dL — ABNORMAL HIGH (ref 70–99)
Glucose-Capillary: 168 mg/dL — ABNORMAL HIGH (ref 70–99)
Glucose-Capillary: 160 mg/dL — ABNORMAL HIGH (ref 70–99)
Glucose-Capillary: 147 mg/dL — ABNORMAL HIGH (ref 70–99)
Glucose-Capillary: 107 mg/dL — ABNORMAL HIGH (ref 70–99)

## 2023-09-21 LAB — LACTIC ACID, PLASMA
Lactic Acid, Venous: 3 mmol/L (ref 0.5–1.9)
Lactic Acid, Venous: 4.3 mmol/L (ref 0.5–1.9)
Lactic Acid, Venous: 2.7 mmol/L (ref 0.5–1.9)
Lactic Acid, Venous: 1.4 mmol/L (ref 0.5–1.9)

## 2023-09-21 LAB — MAGNESIUM
Magnesium: 2.2 mg/dL (ref 1.7–2.4)
Magnesium: 2.1 mg/dL (ref 1.7–2.4)

## 2023-09-21 LAB — TROPONIN I (HIGH SENSITIVITY)
Troponin I (High Sensitivity): 42 ng/L — ABNORMAL HIGH (ref <18)
Troponin I (High Sensitivity): 63 ng/L — ABNORMAL HIGH (ref <18)
Troponin I (High Sensitivity): 159 ng/L (ref <18)

## 2023-09-21 LAB — DIC (DISSEMINATED INTRAVASCULAR COAGULATION)PANEL
D-Dimer, Quant: 7.95 ug/mL-FEU — ABNORMAL HIGH (ref 0.00–0.50)
Fibrinogen: 769 mg/dL — ABNORMAL HIGH (ref 210–475)
INR: 1.7 — ABNORMAL HIGH (ref 0.8–1.2)
Platelets: 247 10*3/uL (ref 150–400)
Prothrombin Time: 19.9 seconds — ABNORMAL HIGH (ref 11.4–15.2)
Smear Review: NONE SEEN
aPTT: 31 seconds (ref 24–36)

## 2023-09-21 MED ORDER — ACETAMINOPHEN 325 MG PO TABS: 650.0000 mg | ORAL_TABLET | ORAL | Status: DC | PRN

## 2023-09-21 MED ORDER — SODIUM CHLORIDE 0.9 % IV SOLN: 2.0000 g | INTRAVENOUS | Status: DC

## 2023-09-21 MED ORDER — FENTANYL CITRATE PF 50 MCG/ML IJ SOSY: 25.0000 ug | PREFILLED_SYRINGE | INTRAMUSCULAR | Status: DC | PRN

## 2023-09-21 MED ORDER — VANCOMYCIN HCL 2000 MG/400ML IV SOLN
2000.0000 mg | Freq: Once | INTRAVENOUS | Status: AC
  Administered 2023-09-21: 2000 mg via INTRAVENOUS
  Filled 2023-09-21 (×2): qty 400

## 2023-09-21 MED ORDER — NOREPINEPHRINE 4 MG/250ML-% IV SOLN
0.0000 ug/min | INTRAVENOUS | Status: DC
  Administered 2023-09-21: 8 ug/min via INTRAVENOUS
  Filled 2023-09-21: qty 250

## 2023-09-21 MED ORDER — DOCUSATE SODIUM 50 MG/5ML PO LIQD
100.0000 mg | Freq: Two times a day (BID) | ORAL | Status: DC
  Administered 2023-09-21 – 2023-09-22 (×2): 100 mg
  Filled 2023-09-21 (×2): qty 10

## 2023-09-21 MED ORDER — HEPARIN (PORCINE) 25000 UT/250ML-% IV SOLN
1550.0000 [IU]/h | INTRAVENOUS | Status: AC
  Administered 2023-09-21: 1450 [IU]/h via INTRAVENOUS
  Filled 2023-09-21: qty 250

## 2023-09-21 MED ORDER — ARFORMOTEROL TARTRATE 15 MCG/2ML IN NEBU
15.0000 ug | INHALATION_SOLUTION | Freq: Two times a day (BID) | RESPIRATORY_TRACT | Status: DC
  Administered 2023-09-22: 15 ug via RESPIRATORY_TRACT
  Filled 2023-09-21: qty 2

## 2023-09-21 MED ORDER — DEXMEDETOMIDINE HCL IN NACL 400 MCG/100ML IV SOLN
0.0000 ug/kg/h | INTRAVENOUS | Status: DC
  Administered 2023-09-21: 0.4 ug/kg/h via INTRAVENOUS

## 2023-09-21 MED ORDER — POTASSIUM CHLORIDE 10 MEQ/100ML IV SOLN
10.0000 meq | INTRAVENOUS | Status: AC
  Administered 2023-09-21 (×4): 10 meq via INTRAVENOUS
  Filled 2023-09-21 (×4): qty 100

## 2023-09-21 MED ORDER — BUDESONIDE 0.5 MG/2ML IN SUSP
0.5000 mg | Freq: Two times a day (BID) | RESPIRATORY_TRACT | Status: DC
  Administered 2023-09-22: 0.5 mg via RESPIRATORY_TRACT
  Filled 2023-09-21: qty 2

## 2023-09-21 MED ORDER — SODIUM CHLORIDE 0.9 % IV SOLN
250.0000 mL | INTRAVENOUS | Status: DC
  Administered 2023-10-03 – 2023-10-04 (×2): 250 mL via INTRAVENOUS

## 2023-09-21 MED ORDER — IPRATROPIUM-ALBUTEROL 0.5-2.5 (3) MG/3ML IN SOLN
3.0000 mL | RESPIRATORY_TRACT | Status: DC | PRN
  Administered 2023-09-22 – 2023-09-30 (×5): 3 mL via RESPIRATORY_TRACT
  Filled 2023-09-21 (×5): qty 3

## 2023-09-21 MED ORDER — NOREPINEPHRINE 4 MG/250ML-% IV SOLN: 2.0000 ug/min | INTRAVENOUS | Status: DC

## 2023-09-21 MED ORDER — ARFORMOTEROL TARTRATE 15 MCG/2ML IN NEBU: 15.0000 ug | INHALATION_SOLUTION | Freq: Two times a day (BID) | RESPIRATORY_TRACT | Status: DC

## 2023-09-21 MED ORDER — MAGNESIUM SULFATE 2 GM/50ML IV SOLN
2.0000 g | Freq: Once | INTRAVENOUS | Status: AC
  Administered 2023-09-21: 2 g via INTRAVENOUS
  Filled 2023-09-21: qty 50

## 2023-09-21 MED ORDER — DOCUSATE SODIUM 50 MG/5ML PO LIQD: 100.0000 mg | Freq: Two times a day (BID) | ORAL | Status: DC | PRN

## 2023-09-21 MED ORDER — PIPERACILLIN-TAZOBACTAM 3.375 G IVPB
3.3750 g | Freq: Three times a day (TID) | INTRAVENOUS | Status: DC
  Administered 2023-09-21 – 2023-09-22 (×2): 3.375 g via INTRAVENOUS
  Filled 2023-09-21: qty 50

## 2023-09-21 MED ORDER — SODIUM CHLORIDE 0.9 % IV BOLUS
1000.0000 mL | Freq: Once | INTRAVENOUS | Status: AC
  Administered 2023-09-21: 1000 mL via INTRAVENOUS

## 2023-09-21 MED ORDER — NOREPINEPHRINE 4 MG/250ML-% IV SOLN
INTRAVENOUS | Status: AC
  Administered 2023-09-21: 2 ug/min via INTRAVENOUS
  Filled 2023-09-21: qty 250

## 2023-09-21 MED ORDER — FUROSEMIDE 10 MG/ML IJ SOLN
40.0000 mg | Freq: Once | INTRAMUSCULAR | Status: AC
  Administered 2023-09-21: 40 mg via INTRAVENOUS
  Filled 2023-09-21: qty 4

## 2023-09-21 MED ORDER — FENTANYL CITRATE PF 50 MCG/ML IJ SOSY: 25.0000 ug | PREFILLED_SYRINGE | Freq: Once | INTRAMUSCULAR | Status: DC

## 2023-09-21 MED ORDER — ASPIRIN 81 MG PO CHEW
81.0000 mg | CHEWABLE_TABLET | Freq: Every day | ORAL | Status: DC
  Administered 2023-09-21 – 2023-09-22 (×2): 81 mg
  Filled 2023-09-21 (×2): qty 1

## 2023-09-21 MED ORDER — VANCOMYCIN HCL 1500 MG/300ML IV SOLN: 1500.0000 mg | INTRAVENOUS | Status: DC

## 2023-09-21 MED ORDER — POLYETHYLENE GLYCOL 3350 17 G PO PACK: 17.0000 g | PACK | Freq: Every day | ORAL | Status: DC | PRN

## 2023-09-21 MED ORDER — SODIUM CHLORIDE 0.9 % IV SOLN: INTRAVENOUS | Status: DC

## 2023-09-21 MED ORDER — FAMOTIDINE 20 MG PO TABS
20.0000 mg | ORAL_TABLET | Freq: Every day | ORAL | Status: DC
  Administered 2023-09-22: 20 mg
  Filled 2023-09-21: qty 1

## 2023-09-21 MED ORDER — POLYETHYLENE GLYCOL 3350 17 G PO PACK
17.0000 g | PACK | Freq: Every day | ORAL | Status: DC
  Administered 2023-09-21 – 2023-09-22 (×2): 17 g
  Filled 2023-09-21: qty 1

## 2023-09-21 MED ORDER — DOCUSATE SODIUM 50 MG/5ML PO LIQD: 100.0000 mg | Freq: Two times a day (BID) | ORAL | Status: DC

## 2023-09-21 MED ORDER — HEPARIN (PORCINE) 25000 UT/250ML-% IV SOLN: 1450.0000 [IU]/h | INTRAVENOUS | Status: DC

## 2023-09-21 MED ORDER — SODIUM CHLORIDE 0.9 % IV BOLUS: 1000.0000 mL | Freq: Once | INTRAVENOUS | Status: DC

## 2023-09-21 MED ORDER — CHLORHEXIDINE GLUCONATE CLOTH 2 % EX PADS
6.0000 | MEDICATED_PAD | Freq: Every day | CUTANEOUS | Status: DC
  Administered 2023-09-21 – 2023-09-30 (×11): 6 via TOPICAL

## 2023-09-21 MED ORDER — FENTANYL 2500MCG IN NS 250ML (10MCG/ML) PREMIX INFUSION
25.0000 ug/h | INTRAVENOUS | Status: DC
  Administered 2023-09-21: 25 ug/h via INTRAVENOUS
  Filled 2023-09-21: qty 250

## 2023-09-21 MED ORDER — FENTANYL BOLUS VIA INFUSION: 25.0000 ug | INTRAVENOUS | Status: DC | PRN

## 2023-09-21 MED ORDER — ATORVASTATIN CALCIUM 80 MG PO TABS: 80.0000 mg | ORAL_TABLET | Freq: Every day | ORAL | Status: DC

## 2023-09-21 MED ORDER — ORAL CARE MOUTH RINSE
15.0000 mL | OROMUCOSAL | Status: DC
  Administered 2023-09-21 – 2023-09-22 (×8): 15 mL via OROMUCOSAL

## 2023-09-21 MED ORDER — LACTATED RINGERS IV BOLUS
1000.0000 mL | Freq: Once | INTRAVENOUS | Status: AC
  Administered 2023-09-21: 1000 mL via INTRAVENOUS

## 2023-09-21 MED ORDER — INSULIN ASPART 100 UNIT/ML IJ SOLN
0.0000 [IU] | INTRAMUSCULAR | Status: DC
  Administered 2023-09-21: 2 [IU] via SUBCUTANEOUS

## 2023-09-21 MED ORDER — BUDESONIDE 0.5 MG/2ML IN SUSP: 0.5000 mg | Freq: Two times a day (BID) | RESPIRATORY_TRACT | Status: DC

## 2023-09-21 MED ORDER — ORAL CARE MOUTH RINSE: 15.0000 mL | OROMUCOSAL | Status: DC | PRN

## 2023-09-21 MED ORDER — SODIUM CHLORIDE 0.9% FLUSH
10.0000 mL | INTRAVENOUS | Status: DC | PRN
  Administered 2023-09-27: 10 mL
  Administered 2023-09-27: 30 mL
  Administered 2023-09-29 (×2): 40 mL

## 2023-09-21 MED ORDER — PIPERACILLIN-TAZOBACTAM 3.375 G IVPB
3.3750 g | Freq: Once | INTRAVENOUS | Status: AC
  Administered 2023-09-21: 3.375 g via INTRAVENOUS
  Filled 2023-09-21: qty 50

## 2023-09-21 MED ORDER — POLYETHYLENE GLYCOL 3350 17 G PO PACK: 17.0000 g | PACK | Freq: Every day | ORAL | Status: DC

## 2023-09-21 MED ORDER — SODIUM CHLORIDE 0.9% FLUSH
10.0000 mL | Freq: Two times a day (BID) | INTRAVENOUS | Status: DC
  Administered 2023-09-21: 10 mL

## 2023-09-21 MED ORDER — MIDAZOLAM HCL 2 MG/2ML IJ SOLN
1.0000 mg | INTRAMUSCULAR | Status: DC | PRN
  Administered 2023-09-21 (×2): 2 mg via INTRAVENOUS
  Filled 2023-09-21 (×2): qty 2

## 2023-09-21 MED ORDER — MIDAZOLAM HCL 2 MG/2ML IJ SOLN
1.0000 mg | INTRAMUSCULAR | Status: DC | PRN
  Administered 2023-09-21: 2 mg via INTRAVENOUS
  Filled 2023-09-21: qty 2

## 2023-09-21 NOTE — Progress Notes (Addendum)
Placed patient on Bipap at 1345. I received a call shortly after saying the patient requested the Bipap off. He couldn't tolerate it. I came directly back. MD and RN at the bedside.

## 2023-09-21 NOTE — Consult Note (Signed)
CCM service - inpatient transfe consultation from DR Maurilio Lovely    S: Timothy Mata at [facility]. Admitted 09/20/2023 wiuth sepsis/UTI. On rocephin. Cutlures pending. TRiad MD rounds this AM fine but later rapid response c/o abd pain.  During this time  A Fltuterr And later hypotensive . Moved to ICU and then ws getting fluids bolus. Labs showed increased wBC/Lactate -> family conversation declared ful code -> right at that time THen 09/21/2023 - PEA x 8-10 min. 3 x epi. ROSC with levophed . Has a PICC. On precedex gtt. Intuated post CPR  Immediate post CPR he was able to shake head and yes not  Functional guy at baseline - home eliquis and amio Lives at home Wife is DPOA  CT head fine CXDR - wet  Currently on heparin gtt  No overt bleeding    has a past medical history of Atrial flutter (HCC), BPH (benign prostatic hyperplasia), COPD (chronic obstructive pulmonary disease) (HCC), Dysrhythmia, Gout, History of kidney stones, HOH (hard of hearing), Hypercholesterolemia, Hypertension, Preretinal fibrosis, left eye, and Spinal headache (12/30/1972).   has a past surgical history that includes Back surgery; Multiple tooth extractions (1990's); Eye surgery; Pars plana vitrectomy (Left, 07/18/2015); Lumbar disc surgery (1974); Foot fracture surgery (Right, 1981); 25 gauge pars plana vitrectomy with 20 gauge mvr port (Left, 07/18/2015); Laser photo ablation (Left, 07/18/2015); Membrane peel (Left, 07/18/2015); Air/fluid exchange (Left, 07/18/2015); Cardioversion (N/A, 04/26/2022); Endarterectomy (Right, 11/25/2022); and Patch angioplasty (Right, 11/25/2022).  No Known Allergies    O Currently 100% fio2 Levopjhed 10 and needs more On precedex    LABS    PULMONARY No results for input(s): "PHART", "PCO2ART", "PO2ART", "HCO3", "TCO2", "O2SAT" in the last 168 hours.  Invalid input(s): "PCO2", "PO2"  CBC Recent Labs  Lab 09/20/23 1319 09/21/23 0449 09/21/23 1234  HGB 13.0  11.6* 12.3*  HCT 41.7 36.0* 38.1*  WBC 28.3* 22.2* 32.2*  PLT 245 246 248    COAGULATION No results for input(s): "INR" in the last 168 hours.  CARDIAC  No results for input(s): "TROPONINI" in the last 168 hours. No results for input(s): "PROBNP" in the last 168 hours.   CHEMISTRY Recent Labs  Lab 09/20/23 1319 09/21/23 0449 09/21/23 1234  NA 137 137 136  K 3.5 3.5 3.4*  CL 101 103 103  CO2 26 23 22   GLUCOSE 131* 100* 135*  BUN 39* 35* 33*  CREATININE 2.26* 1.95* 1.94*  CALCIUM 8.1* 7.6* 7.8*  MG  --   --  2.2   Estimated Creatinine Clearance: 33.5 mL/min (A) (by C-G formula based on SCr of 1.94 mg/dL (H)).   LIVER Recent Labs  Lab 09/20/23 1319  AST 19  ALT 16  ALKPHOS 105  BILITOT 1.4*  PROT 6.1*  ALBUMIN 2.7*     INFECTIOUS Recent Labs  Lab 09/20/23 1607 09/21/23 1234  LATICACIDVEN 1.8 3.0*     ENDOCRINE CBG (last 3)  Recent Labs    09/21/23 1224 09/21/23 1329  GLUCAP 112* 167*         IMAGING x48h  - image(s) personally visualized  -   highlighted in bold CT HEAD WO CONTRAST ( )  Result Date: 09/21/2023 CLINICAL DATA:  Neuro deficit, acute, stroke suspected. Patient was unresponsive. EXAM: CT HEAD WITHOUT CONTRAST TECHNIQUE: Contiguous axial images were obtained from the base of the skull through the vertex without intravenous contrast. RADIATION DOSE REDUCTION: This exam was performed according to the departmental dose-optimization program which includes automated  exposure control, adjustment of the mA and/or kV according to patient size and/or use of iterative reconstruction technique. COMPARISON:  CT head without contrast FINDINGS: Brain: Mild atrophy and white matter changes are present. No acute infarct, hemorrhage, or mass lesion is present. The ventricles are of normal size. No significant extraaxial fluid collection is present. The brainstem and cerebellum are within normal limits. Midline structures are within normal limits.  Vascular: Atherosclerotic calcifications are present within the cavernous internal carotid arteries bilaterally. No hyperdense vessel is present. Skull: Calvarium is intact. No focal lytic or blastic lesions are present. No significant extracranial soft tissue lesion is present. Sinuses/Orbits: Bilateral lens replacements are noted. Globes and orbits are otherwise unremarkable. Chronic bilateral maxillary sinus disease is present with chronic wall thickening. Minimal mucosal thickening is present bilaterally. No fluid levels are present. Mild mucosal thickening is present at the left sphenoid sinus. The paranasal sinuses are otherwise clear. The mastoid air cells are clear. IMPRESSION: 1. No acute intracranial abnormality. 2. Mild atrophy and white matter disease likely reflects the sequela of chronic microvascular ischemia. 3. Chronic bilateral maxillary sinus disease. Electronically Signed   By: Marin Roberts M.D.   On: 09/21/2023 15:00   [place] EKG SITE RITE  Result Date: 09/21/2023 If [facility] image not attached, placement could not be confirmed due to current cardiac rhythm.  DG CHEST PORT 1 VIEW  Result Date: 09/21/2023 CLINICAL DATA:  [id number] Acute hypoxemic respiratory failure (HCC) [id number] EXAM: PORTABLE CHEST 1 VIEW COMPARISON:  September 20, 2023 FINDINGS: The cardiomediastinal silhouette is unchanged in contour. Favored RIGHT-sided pleural thickening. No pneumothorax. Central vascular congestion with mild diffuse interstitial prominence likely reflecting a degree of mild underlying pulmonary edema. Severe atherosclerotic calcifications. Status post ORIF of RIGHT-sided ribs. IMPRESSION: Similar appearance of mild pulmonary edema. Electronically Signed   By: Meda Klinefelter M.D.   On: 09/21/2023 13:35   DG Chest 2 View  Result Date: 09/20/2023 CLINICAL DATA:  weakness. EXAM: CHEST - 2 VIEW COMPARISON:  07/04/2022. FINDINGS: There are probable bilateral layering pleural effusions. Mild  pulmonary vascular congestion. No acute consolidation or lung collapse. Moderately enlarged cardio-mediastinal silhouette. No acute osseous abnormalities. The soft tissues are within normal limits. IMPRESSION: *Cardiomegaly, mild pulmonary vascular congestion and probable bilateral layering pleural effusions, favoring congestive heart failure/pulmonary edema. Electronically Signed   By: Jules Schick M.D.   On: 09/20/2023 16:26   CT ABDOMEN PELVIS WO CONTRAST  Result Date: 09/20/2023 CLINICAL DATA:  Abdominal pain, acute, nonlocalized +UTI, WBC of 28k. EXAM: CT ABDOMEN AND PELVIS WITHOUT CONTRAST TECHNIQUE: Multidetector CT imaging of the abdomen and pelvis was performed following the standard protocol without IV contrast. RADIATION DOSE REDUCTION: This exam was performed according to the departmental dose-optimization program which includes automated exposure control, adjustment of the mA and/or kV according to patient size and/or use of iterative reconstruction technique. COMPARISON:  CT scan abdomen and pelvis report from 08/03/2013. Please note, images are not available for review. FINDINGS: Lower chest: The lung bases are clear. There are bilateral small pleural effusions. The heart is normal in size. No pericardial effusion. Hepatobiliary: The liver is normal in size. Non-cirrhotic configuration. No suspicious mass. No intrahepatic or extrahepatic bile duct dilation. No calcified gallstones. Normal gallbladder wall thickness. No pericholecystic inflammatory changes. Pancreas: Unremarkable. No pancreatic ductal dilatation or surrounding inflammatory changes. Spleen: Within normal limits. No focal lesion. Adrenals/Urinary Tract: Adrenal glands are unremarkable. No suspicious renal mass within the limitations of this unenhanced exam. Bilateral kidneys exhibit mild diffuse  cortical atrophy. Bilateral extrarenal pelvis noted. There is mild left hydronephrosis and entire hydroureter without discrete obstructing  mass or ureterolithiasis. Findings are likely secondary to chronic vesicoureteric reflux. No right hydroureteronephrosis. Diffuse trabeculations and several wide necked bladder diverticula noted, suggesting sequela of chronic urinary outflow obstruction. Stomach/Bowel: No disproportionate dilation of the small or large bowel loops. No evidence of abnormal bowel wall thickening or inflammatory changes. The appendix is unremarkable. Vascular/Lymphatic: No ascites or pneumoperitoneum. No abdominal or pelvic lymphadenopathy, by size criteria. No aneurysmal dilation of the major abdominal arteries. There are moderate peripheral atherosclerotic vascular calcifications of the aorta and its major branches. Reproductive: Enlarged prostate. Symmetric seminal vesicles. Other: There are fat containing umbilical and bilateral inguinal hernias. The soft tissues and abdominal wall are otherwise unremarkable. Musculoskeletal: No suspicious osseous lesions. Multiple old healed posteromedial fractures of right lower ribs as well as multiple rib ORIF noted. There are mild multilevel degenerative changes in the visualized spine. IMPRESSION: 1. No acute inflammatory process identified within the abdomen or pelvis. 2. Mild left hydroureteronephrosis without obstructing mass or ureterolithiasis. Findings are likely secondary to chronic vesicoureteric reflux. Enlarged prostate and bladder wall trabeculations/diverticula. 3. Bilateral small pleural effusions. 4. Multiple other nonacute observations, as described above. Electronically Signed   By: Jules Schick M.D.   On: 09/20/2023 16:24     A Post CPR Resp failure Shock UTI ABd pain - ? Cause AKI  Hx of  Aflutte ron DOAC  Plan  - sedatiion protocol start /stop precedex -insulin protocol - expand abx to vanc/zosyn  - fluid bolus  - continue levophjed - get stat [facility]Abd without contrast at [facility] prior to transfer  - stat labs incl ABG post CPR to be done at [facility]  prior to trnasfer - plae foley  - hold heparin (Sinus and immediate post CPR)   Accept to CCM services in [place] - at [facility] D/w Dr Ocie Doyne [facility]   SIGNATURE    Dr. Kalman Shan, M.D., F.C.C.P,  Pulmonary and Critical Care Medicine Staff Physician, [facility] Director - Interstitial Lung Disease  Program  Pulmonary Fibrosis [facility] at [facility] [place], [place], [address]   Pager: [phone], If no answer  -> Check AMION or Try [phone] Telephone (clinical office): [phone] Telephone (research): [phone]  4:08 PM 09/21/2023

## 2023-09-21 NOTE — Progress Notes (Signed)
Filemon Gimlin [id number] Admission Data: 09/21/2023 7:18 PM Attending Provider: Erick Blinks, DO  YQM:VHQIONGE, Helane Rima, PA-C Consults/ Treatment Team:   Kegan Guseman is a 89 y.o. male patient admitted from ED awake, alert  & orientated  X 3,  Full Code, VSS - Blood pressure (!) 142/65, pulse 70, temperature 98.9 F (37.2 C), temperature source Oral, resp. rate 16, height 5\' 10"  (1.778 m), weight 111.6 kg, SpO2 100%., O2 intubated . Tele # [id number] placed  Pt admitted to 3 [place] room 6.  Skin, clean-dry- intact without evidence of bruising, or skin tears.   No evidence of skin break down noted on exam.     Will cont to monitor and assist as needed.  Phineas Douglas, [place] 09/21/2023 7:18 PM

## 2023-09-21 NOTE — Plan of Care (Signed)

## 2023-09-21 NOTE — Progress Notes (Signed)
This nurse was in another room and heard the wife for this patient yelling his name. This nurse came down the hall and seen the wife and she said "something is wrong he isn't responding to me." This nurse when in the room and patient was laying on his left side and pursed lip breathing, and patient was purple and diaphoretic. This nurse called the pts name and he did not respond to this nurse. This nurse laid the patient on his back and did a sternal rub and he did not respond. This nurse called a Rapid at 1230. VS obtained his BP was 135/74, 80 Pulse O2 93% on RA R 33 98.5 Temp. CBG 112. Dr. Sherryll Burger came to assess pt. Pt was now responding to voice and answering questions appropriately. VS obtained again and BP was 75/46 pulse 96 on 1.5 L R 32 N.O for bolus, head CT, and chest CXR. N.O for step down. Bedside report given to Victorino Dike, [place]

## 2023-09-21 NOTE — Progress Notes (Signed)
0830 Patient eating and with RN, unavailable for DPI at this time. Treatment to be administered at a later time.

## 2023-09-21 NOTE — Progress Notes (Signed)
Peripherally Inserted Central Catheter Placement  The IV Nurse has discussed with the patient and/or persons authorized to consent for the patient, the purpose of this procedure and the potential benefits and risks involved with this procedure.  The benefits include less needle sticks, lab draws from the catheter, and the patient may be discharged home with the catheter. Risks include, but not limited to, infection, bleeding, blood clot (thrombus formation), and puncture of an artery; nerve damage and irregular heartbeat and possibility to perform a PICC exchange if needed/ordered by physician.  Alternatives to this procedure were also discussed.  Bard Power PICC patient education guide, fact sheet on infection prevention and patient information card has been provided to patient /or left at bedside.  Wife at bedside gave consent.  PICC Placement Documentation  PICC Triple Lumen 09/21/23 Right Basilic 44 cm 0 cm (Active)  Indication for Insertion or Continuance of Line Vasoactive infusions 09/21/23 1525  Exposed Catheter (cm) 0 cm 09/21/23 1525  Site Assessment Clean, Dry, Intact 09/21/23 1525  Lumen #1 Status Saline locked;Blood return noted 09/21/23 1525  Lumen #2 Status Saline locked;Blood return noted 09/21/23 1525  Lumen #3 Status Saline locked;Blood return noted 09/21/23 1525  Dressing Type Transparent;Securing device 09/21/23 1525  Dressing Status Antimicrobial disc in place;Clean, Dry, Intact 09/21/23 1525  Line Care Connections checked and tightened 09/21/23 1525  Line Adjustment (NICU/IV Team Only) No 09/21/23 1525  Dressing Intervention New dressing 09/21/23 1525  Dressing Change Due 09/28/23 09/21/23 1525       Elliot Dally 09/21/2023, 3:37 PM

## 2023-09-21 NOTE — ED Provider Notes (Signed)
Patient admitted yesterday for bladder obstruction.  Patient became acutely more sick consistent with sepsis.  Was just recently moved to the ICU.  Shortly after arrival went into cardiac arrest.  We were contacted to come up to intubate.  Hospitalist was running the arrest.  Intubation done without difficulty.  Actually after intubation and the arrest medications that patient was receiving he had spontaneous return of circulation.  Date/Time: 09/21/2023 2:19 PM  Performed by: Vanetta Mulders, MDPre-anesthesia Checklist: Patient identified, Emergency Drugs available and Suction available Oxygen Delivery Method: Ambu bag Preoxygenation: Pre-oxygenation with 100% oxygen Induction Type: Cricoid Pressure applied Laryngoscope Size: Glidescope and 3 Tube size: 7.0 mm Number of attempts: 2 Airway Equipment and Method: Rigid stylet Placement Confirmation: ETT inserted through vocal cords under direct vision, Breath sounds checked- equal and bilateral and CO2 detector    Patient intubated during cardiac arrest.  CPR was held.  Suction was required.  The patient was doing a little bit of breathing on his own so it did take second past 2 verify that we definitely got it in through the cords.  On the first look felt that that may have not been so we pulled the tube back and then reattempted and that was successful.     Vanetta Mulders, MD 09/21/23 [phone]

## 2023-09-21 NOTE — Progress Notes (Signed)
Pt arrived from [facility] via [facility]. 7.5 ETT at 23cm at the lip in place, bilateral breath sounds heard.

## 2023-09-21 NOTE — H&P (Signed)
NAME:  Timothy Mata, MRN:  [id number], DOB:  1933-06-11, LOS: 1 ADMISSION DATE:  10-18-2023, CONSULTATION DATE:  9/22 REFERRING MD:  Dr. Sherryll Burger, CHIEF COMPLAINT:  cardiac arrest   History of Present Illness:  Patient is a 89 yo M w/ pertinent PMH a flutter on Eliquis, COPD, HFrEF, HTN, HLD presents to [facility] ED on 18-Oct-2023 with abdominal pain.  Patient having 1 week of abdominal pain that progressively worsened.  Came to [facility] ED on 10-18-23.  Denies any fevers, N/V/D.  Has had increased urinary frequency.  CXR with mild pulm edema.  CT ABD/pelvis no acute abnormality; mild left hydroureteronephrosis; small bilateral pleural effusions.  WBC/LA elevated. UA with nitrite and large leukocytes. Cultures obtained and given IV fluids.  Patient started on Rocephin.    On 9/22 patient with increased hypotension.  Patient given IV fluids and transferred to ICU.  Patient had PEA arrest with ROSC in 10 minutes.  Patient intubated post CPR.  CT head with no acute abnormality.  CXR with pulmonary edema.  Transferring to [facility] ED.  PCCM consulted.  Pertinent  Medical History   Past Medical History:  Diagnosis Date   Atrial flutter (HCC)    a. s/p DCCV in 03/2022 b. repeat EKG at follow-up from DCCV showing atrial fibrillation   BPH (benign prostatic hyperplasia)    COPD (chronic obstructive pulmonary disease) (HCC)    Dysrhythmia    Gout    "left big toe"   History of kidney stones    HOH (hard of hearing)    Hypercholesterolemia    Hypertension    Preretinal fibrosis, left eye    Spinal headache 12/30/1972     Significant Hospital Events: Including procedures, antibiotic start and stop dates in addition to other pertinent events   10/18/2023 admitted to aph w/ sepsis and uti 9/22 pea arrest; transfer to [facility]  Interim History / Subjective:  Patient intubated on vent On minimal sedation; awake and following commands Levo 9 mcg  Objective   Blood pressure (!) 142/65, pulse 70, temperature 98.9 F (37.2 C),  temperature source Oral, resp. rate 16, height 5\' 10"  (1.778 m), weight 111.6 kg, SpO2 100%.    Vent Mode: PRVC FiO2 (%):  [100 %] 100 % Set Rate:  [15 bmp-18 bmp] 18 bmp Vt Set:  [580 mL] 580 mL PEEP:  [5 cmH20-8 cmH20] 8 cmH20 Plateau Pressure:  [12 cmH20] 12 cmH20   Intake/Output Summary (Last 24 hours) at 09/21/2023 1906 Last data filed at 09/21/2023 1724 Gross per 24 hour  Intake 1077.79 ml  Output 1100 ml  Net -22.21 ml   Filed Weights   October 18, 2023 1242 18-Oct-2023 1800 09/21/23 1321  Weight: 111.1 kg 110.7 kg 111.6 kg    Examination: General:   NAD; critically ill appearing on mech vent HEENT: MM pink/moist; ETT in place Neuro: Aox3; MAE; sedated; CV: s1s2, irregular rate 60s, no m/r/g PULM:  dim clear BS bilaterally; on mech vent PRVC GI: soft, bsx4 active  Extremities: cool/dry, ble edema  Skin: no rashes or lesions    Resolved Hospital Problem list     Assessment & Plan:   PEA arrest Septic shock UTI Plan: -will admit to ICU w/ continuous telemetry monitoring -continue pressors for MAP goal >65 -trend troponin and lactate -check ABG -K repleted; trend CMP and Mag and replete electrolytes as needed -echo -f/u bcx2, urine culture/UA, and trach culture -cont broad spectrum antibiotics -check procalcitonin -trend wbc/fever curve -patient awake following commands so will hold on  TTM normothermia protocol  Acute respiratory failure post arrest Pulmonary edema and small b/l pleural effusion Hx COPD Plan: -cxr on arrival -rest on vent overnight LTVV strategy with tidal volumes of 6-8 cc/kg ideal body weight -check ABG and adjust settings accordingly  -Wean PEEP/FiO2 for SpO2 >92% -VAP bundle in place -Daily SAT and SBT -PAD protocol in place -wean sedation for RASS goal 0 to -1 -brovana/pulmicort; duoneb prn -consider lasix x1  Afib/flutter on eliquis and amio Plan: -heparin gtt -tele monitoring -hold home amio while rate 60s -trend electrolytes and  replete as needed  Hypokalemia Elevated creat on ckd 3a Plan: -k repleted -check mag -Trend BMP / urinary output -Replace electrolytes as indicated -Avoid nephrotoxic agents, ensure adequate renal perfusion  Shock liver Plan: -trend cmp  Anemia Plan: -trend cbc  Mild Hyperglycemia Plan: -cbg monitoring -hold on ssi for now -check a1c  Htn Hld HFrEF Plan: -hold home anti-hypertensives -resume statin and asa -lasix x1 -daily weights; strict I/o's     Best Practice (right click and "Reselect all SmartList Selections" daily)   Diet/type: NPO w/ meds via tube DVT prophylaxis: systemic heparin GI prophylaxis: H2B Lines: N/A; PICC line Foley:  Yes, and it is still needed Code Status:  full code Last date of multidisciplinary goals of care discussion [9/22 updated wife over phone]  Labs   CBC: Recent Labs  Lab 09/14/2023 1319 09/21/23 0449 09/21/23 1234 09/21/23 1639 09/21/23 1640  WBC 28.3* 22.2* 32.2* 37.6*  --   HGB 13.0 11.6* 12.3* 10.9*  --   HCT 41.7 36.0* 38.1* 34.7*  --   MCV 92.7 91.4 92.0 94.3  --   PLT 245 246 248 250 247    Basic Metabolic Panel: Recent Labs  Lab 09/27/2023 1319 09/21/23 0449 09/21/23 1234 09/21/23 1640  NA 137 137 136 135  K 3.5 3.5 3.4* 3.9  CL 101 103 103 102  CO2 26 23 22  20*  GLUCOSE 131* 100* 135* 186*  BUN 39* 35* 33* 36*  CREATININE 2.26* 1.95* 1.94* 2.30*  CALCIUM 8.1* 7.6* 7.8* 7.1*  MG  --   --  2.2  --    GFR: Estimated Creatinine Clearance: 28.3 mL/min (A) (by C-G formula based on SCr of 2.3 mg/dL (H)). Recent Labs  Lab 09/05/2023 1319 09/29/2023 1607 09/21/23 0449 09/21/23 1234 09/21/23 1530 09/21/23 1639 09/21/23 1640  WBC 28.3*  --  22.2* 32.2*  --  37.6*  --   LATICACIDVEN  --  1.8  --  3.0* 4.3*  --  2.7*    Liver Function Tests: Recent Labs  Lab 09/11/2023 1319 09/21/23 1640  AST 19 102*  ALT 16 75*  ALKPHOS 105 96  BILITOT 1.4* 1.8*  PROT 6.1* 5.1*  ALBUMIN 2.7* 2.1*   Recent Labs   Lab 09/19/2023 1319  LIPASE 27   No results for input(s): "AMMONIA" in the last 168 hours.  ABG    Component Value Date/Time   TCO2 28 11/25/2022 0732     Coagulation Profile: Recent Labs  Lab 09/21/23 1640  INR 1.7*    Cardiac Enzymes: No results for input(s): "CKTOTAL", "CKMB", "CKMBINDEX", "TROPONINI" in the last 168 hours.  HbA1C: Hgb A1c MFr Bld  Date/Time Value Ref Range Status  03/24/2022 01:05 PM 5.9 (H) 4.8 - 5.6 % Final    Comment:    (NOTE) Pre diabetes:          5.7%-6.4%  Diabetes:              >  6.4%  Glycemic control for   <7.0% adults with diabetes     CBG: Recent Labs  Lab 09/21/23 1224 09/21/23 1329 09/21/23 1614 09/21/23 1843  GLUCAP 112* 167* 168* 160*    Review of Systems:   Patient is intubated; therefore, history has been obtained from chart review.    Past Medical History:  He,  has a past medical history of Atrial flutter (HCC), BPH (benign prostatic hyperplasia), COPD (chronic obstructive pulmonary disease) (HCC), Dysrhythmia, Gout, History of kidney stones, HOH (hard of hearing), Hypercholesterolemia, Hypertension, Preretinal fibrosis, left eye, and Spinal headache (12/30/1972).   Surgical History:   Past Surgical History:  Procedure Laterality Date   25 GAUGE PARS PLANA VITRECTOMY WITH 20 GAUGE MVR PORT Left 07/18/2015   Procedure: 25 GAUGE PARS PLANA VITRECTOMY WITH 20 GAUGE MVR PORT;  Surgeon: Sherrie George, MD;  Location: [facility] OR;  Service: Ophthalmology;  Laterality: Left;   AIR/FLUID EXCHANGE Left 07/18/2015   Procedure: AIR/FLUID EXCHANGE;  Surgeon: Sherrie George, MD;  Location: [facility] OR;  Service: Ophthalmology;  Laterality: Left;   BACK SURGERY     CARDIOVERSION N/A 04/26/2022   Procedure: CARDIOVERSION;  Surgeon: Jonelle Sidle, MD;  Location: [facility] ORS;  Service: Cardiovascular;  Laterality: N/A;   ENDARTERECTOMY Right 11/25/2022   Procedure: ENDARTERECTOMY CAROTID WITH PATCH ANGIOPLASTY;  Surgeon: Cephus Shelling, MD;  Location: [facility] OR;  Service: Vascular;  Laterality: Right;   EYE SURGERY     FOOT FRACTURE SURGERY Right 1981   LASER PHOTO ABLATION Left 07/18/2015   Procedure: LASER PHOTO ABLATION;  Surgeon: Sherrie George, MD;  Location: [facility] OR;  Service: Ophthalmology;  Laterality: Left;  head scope    LUMBAR DISC SURGERY  1974   MEMBRANE PEEL Left 07/18/2015   Procedure: MEMBRANE PEEL;  Surgeon: Sherrie George, MD;  Location: [facility] OR;  Service: Ophthalmology;  Laterality: Left;   MULTIPLE TOOTH EXTRACTIONS  1990's   PARS PLANA VITRECTOMY Left 07/18/2015   PATCH ANGIOPLASTY Right 11/25/2022   Procedure: PATCH ANGIOPLASTY WITH 1 cm X 6 cm XENOSURE BIOLOGIC PATCH;  Surgeon: Cephus Shelling, MD;  Location: [facility] OR;  Service: Vascular;  Laterality: Right;     Social History:   reports that he has quit smoking. His smoking use included cigarettes. He has a 32 pack-year smoking history. He has never used smokeless tobacco. He reports that he does not drink alcohol and does not use drugs.   Family History:  His family history includes Cancer in his father.   Allergies No Known Allergies   Home Medications  Prior to Admission medications   Medication Sig Start Date End Date Taking? Authorizing Provider  amiodarone (PACERONE) 200 MG tablet Take 200 mg Daily and none on Sunday 03/11/23  Yes Marinus Maw, MD  apixaban (ELIQUIS) 2.5 MG TABS tablet TAKE 1 TABLET BY MOUTH TWICE A DAY 05/21/23  Yes Pricilla Riffle, MD  aspirin 81 MG chewable tablet Chew 1 tablet (81 mg total) by mouth daily. 11/26/22  Yes Rhyne, Ames Coupe, PA-C  atorvastatin (LIPITOR) 80 MG tablet Take 1 tablet (80 mg total) by mouth daily. 03/27/23 03/21/24 Yes Pricilla Riffle, MD  Fluticasone-Salmeterol (ADVAIR) 250-50 MCG/DOSE AEPB Inhale 1 puff into the lungs 2 (two) times daily.   Yes [provider]  furosemide (LASIX) 40 MG tablet Take 1 tablet (40 mg total) by mouth daily. 07/07/23 10/05/23 Yes Pricilla Riffle, MD  levothyroxine  (SYNTHROID) 75 MCG tablet Take 75 mcg  by mouth daily before breakfast. 08/04/23 10/26/24 Yes [provider]  metoprolol succinate (TOPROL-XL) 100 MG 24 hr tablet TAKE 1 TABLET (100 MG TOTAL) BY MOUTH IN THE MORNING. TAKE WITH OR IMMEDIATELY FOLLOWING A MEAL. 06/12/23 06/06/24 Yes Pricilla Riffle, MD  nitroGLYCERIN (NITROSTAT) 0.4 MG SL tablet Place 1 tablet (0.4 mg total) under the tongue every 5 (five) minutes x 3 doses as needed for chest pain. 03/27/22  Yes [place], Alvira Philips, DO  tamsulosin (FLOMAX) 0.4 MG CAPS capsule Take 1 capsule (0.4 mg total) by mouth daily after supper. Patient not taking: Reported on 09/21/2023 03/27/22   [place], Eric J, DO     Critical care time: 45 minutes     JD Anselm Lis [facility] Pulmonary & Critical Care 09/21/2023, 7:06 PM  Please see [email or web address] for pager details.  From 7A-7P if no response, please call [phone]. After hours, please call ELink [phone].

## 2023-09-21 NOTE — Progress Notes (Signed)
Patient arrived from 300 unit at around 1327. Patient pale in color, alert and oriented x3 (time, date and location but not situation). Patient breathing noted to be tachypnea, belly breathing. O2 sat 97% on 2L via nasal cannula. Notified Dr Sherryll Burger, Bipap ordered and RT called. Wife at bedside updated. Patient was NOT able to tolerated Bipap and writer put patient back on 2L [place] O2 and RT called and on unit to assess. Dr Sherryll Burger in to assess and while in room discussing with patient wife about intubation of patient, writer assessing patient and noted that patient suddenly started making what sounded like a choking sound and then no breath sounds noted and patient stopped breathing at 1357. Code called, Patient was oxygenated using the bag-valve mask, Dr Sherryll Burger at bedside and compressions started at 1358. Epi given at 1358, 1401 & 1404. Patient successfully intubated at 1403. At 1405, patient noted spontaneous respirations and return of pulse.   Prior to sedation, patient noted to respond to verbal stimuli and nodded yes in response when asked, "Julia are you ok? Can you hear me?" Patient shook head No in response when asked, "are you in pain?" Dr Sherryll Burger made aware that patient did respond post CPR.

## 2023-09-21 NOTE — Progress Notes (Addendum)
ANTICOAGULATION CONSULT NOTE - Initial Consult  Pharmacy Consult for heparin gtt Indication: atrial fibrillation  No Known Allergies  Patient Measurements: Height: 5\' 10"  (177.8 cm) Weight: 111.6 kg (246 lb 0.5 oz) IBW/kg (Calculated) : 73 Heparin Dosing Weight: 97.4 kg  Vital Signs: Temp: 98.9 F (37.2 C) (09/22 1800) Temp Source: Oral (09/22 1800) BP: 130/71 (09/22 1930) Pulse Rate: 68 (09/22 1930)  Labs: Recent Labs    09/21/23 0449 09/21/23 1234 09/21/23 1530 09/21/23 1639 09/21/23 1640  HGB 11.6* 12.3*  --  10.9*  --   HCT 36.0* 38.1*  --  34.7*  --   PLT 246 248  --  250 247  APTT  --   --   --   --  31  LABPROT  --   --   --   --  19.9*  INR  --   --   --   --  1.7*  CREATININE 1.95* 1.94*  --   --  2.30*  TROPONINIHS  --  42* 63*  --   --     Estimated Creatinine Clearance: 28.3 mL/min (A) (by C-G formula based on SCr of 2.3 mg/dL (H)).   Medical History: Past Medical History:  Diagnosis Date   Atrial flutter (HCC)    a. s/p DCCV in 03/2022 b. repeat EKG at follow-up from DCCV showing atrial fibrillation   BPH (benign prostatic hyperplasia)    COPD (chronic obstructive pulmonary disease) (HCC)    Dysrhythmia    Gout    "left big toe"   History of kidney stones    HOH (hard of hearing)    Hypercholesterolemia    Hypertension    Preretinal fibrosis, left eye    Spinal headache 12/30/1972    Medications:  Medications Prior to Admission  Medication Sig Dispense Refill Last Dose   amiodarone (PACERONE) 200 MG tablet Take 200 mg Daily and none on Sunday 90 tablet 3 09/19/2023   apixaban (ELIQUIS) 2.5 MG TABS tablet TAKE 1 TABLET BY MOUTH TWICE A DAY 60 tablet 5 09/19/2023 at 2000   aspirin 81 MG chewable tablet Chew 1 tablet (81 mg total) by mouth daily.   09/19/2023 at 2000   atorvastatin (LIPITOR) 80 MG tablet Take 1 tablet (80 mg total) by mouth daily. 30 tablet 0 09/19/2023   Fluticasone-Salmeterol (ADVAIR) 250-50 MCG/DOSE AEPB Inhale 1 puff into  the lungs 2 (two) times daily.   09/20/2023   furosemide (LASIX) 40 MG tablet Take 1 tablet (40 mg total) by mouth daily. 90 tablet 3 09/19/2023   levothyroxine (SYNTHROID) 75 MCG tablet Take 75 mcg by mouth daily before breakfast.   09/19/2023   metoprolol succinate (TOPROL-XL) 100 MG 24 hr tablet TAKE 1 TABLET (100 MG TOTAL) BY MOUTH IN THE MORNING. TAKE WITH OR IMMEDIATELY FOLLOWING A MEAL. 90 tablet 1 09/19/2023   nitroGLYCERIN (NITROSTAT) 0.4 MG SL tablet Place 1 tablet (0.4 mg total) under the tongue every 5 (five) minutes x 3 doses as needed for chest pain. 10 tablet 12 unknown   tamsulosin (FLOMAX) 0.4 MG CAPS capsule Take 1 capsule (0.4 mg total) by mouth daily after supper. (Patient not taking: Reported on 09/20/2023) 30 capsule 3 Not Taking   Scheduled:   Chlorhexidine Gluconate Cloth  6 each Topical Daily   docusate  100 mg Per Tube BID   [START ON 09/22/2023] famotidine  20 mg Per Tube Daily   fentaNYL (SUBLIMAZE) injection  25 mcg Intravenous Once   fluticasone furoate-vilanterol  1 puff Inhalation Daily   insulin aspart  0-9 Units Subcutaneous Q4H   mouth rinse  15 mL Mouth Rinse Q2H   polyethylene glycol  17 g Per Tube Daily   sodium chloride flush  10-40 mL Intracatheter Q12H   tamsulosin  0.4 mg Oral QPC supper    Assessment: 32 YOM admitted with a cc of abdominal pain. He has a PMH positive for Atrial flutter maintained on Eliquis in the ambulatory setting. His last dose of Eliquis was 09/21/2023 at 08:24 Consult for heparin gtt received  Goal of Therapy:  Heparin level 0.3-0.7 units/ml aPTT 66-102 seconds Monitor platelets by anticoagulation protocol: Yes   Plan:  Start heparin infusion at 1450 units/hr at 20:30 Check heparin level and aPTT in 8 hours and daily while on heparin Once aPTT and HL correlate, may DC aPTT monitoring Continue to monitor H&H and platelets  Addelynn Batte BS, PharmD, BCPS Clinical Pharmacist 09/21/2023 7:59 PM  Contact: [phone] after 3  PM  "Be curious, not judgmental..." -Debbora Dus

## 2023-09-21 NOTE — Plan of Care (Signed)
progressing

## 2023-09-21 NOTE — Progress Notes (Signed)
RT attempted sputum sample, no secretions at this time. RT will attempt at next round.

## 2023-09-21 NOTE — Progress Notes (Signed)
Date and time results received: 09/21/23 1645 (use smartphrase ".now" to insert current time)  Test: Lactic Acid   Critical Value: 4.3  Name of Provider Notified: Dr Sherryll Burger   Orders Received? Or Actions Taken?:  No new orders currently

## 2023-09-21 NOTE — Progress Notes (Signed)
Lab called critical Lactic Acid at 3.0. Dr. Sherryll Burger made aware. Patient currently getting NS Bolus and started on antibiotics.

## 2023-09-21 NOTE — Progress Notes (Signed)
Aline attempted x3. ABG attempted x 3 as well by multiple RTs.

## 2023-09-21 NOTE — Progress Notes (Addendum)
ANTICOAGULATION CONSULT NOTE - Initial Consult  Pharmacy Consult for heparin Indication: atrial fibrillation  No Known Allergies  Patient Measurements: Height: 5\' 10"  (177.8 cm) Weight: 111.6 kg (246 lb 0.5 oz) IBW/kg (Calculated) : 73 Heparin Dosing Weight: 97 kg  Vital Signs: Temp: 97.7 F (36.5 C) (09/22 1327) Temp Source: Axillary (09/22 1327) BP: 86/48 (09/22 1430) Pulse Rate: 87 (09/22 1430)  Labs: Recent Labs    09/20/23 1319 09/21/23 0449 09/21/23 1234  HGB 13.0 11.6* 12.3*  HCT 41.7 36.0* 38.1*  PLT 245 246 248  CREATININE 2.26* 1.95* 1.94*  TROPONINIHS  --   --  42*    Estimated Creatinine Clearance: 33.5 mL/min (A) (by C-G formula based on SCr of 1.94 mg/dL (H)).   Medical History: Past Medical History:  Diagnosis Date   Atrial flutter (HCC)    a. s/p DCCV in 03/2022 b. repeat EKG at follow-up from DCCV showing atrial fibrillation   BPH (benign prostatic hyperplasia)    COPD (chronic obstructive pulmonary disease) (HCC)    Dysrhythmia    Gout    "left big toe"   History of kidney stones    HOH (hard of hearing)    Hypercholesterolemia    Hypertension    Preretinal fibrosis, left eye    Spinal headache 12/30/1972    Medications:  Medications Prior to Admission  Medication Sig Dispense Refill Last Dose   amiodarone (PACERONE) 200 MG tablet Take 200 mg Daily and none on Sunday 90 tablet 3 09/19/2023   apixaban (ELIQUIS) 2.5 MG TABS tablet TAKE 1 TABLET BY MOUTH TWICE A DAY 60 tablet 5 09/19/2023 at 2000   aspirin 81 MG chewable tablet Chew 1 tablet (81 mg total) by mouth daily.   09/19/2023 at 2000   atorvastatin (LIPITOR) 80 MG tablet Take 1 tablet (80 mg total) by mouth daily. 30 tablet 0 09/19/2023   Fluticasone-Salmeterol (ADVAIR) 250-50 MCG/DOSE AEPB Inhale 1 puff into the lungs 2 (two) times daily.   09/20/2023   furosemide (LASIX) 40 MG tablet Take 1 tablet (40 mg total) by mouth daily. 90 tablet 3 09/19/2023   levothyroxine (SYNTHROID) 75 MCG  tablet Take 75 mcg by mouth daily before breakfast.   09/19/2023   metoprolol succinate (TOPROL-XL) 100 MG 24 hr tablet TAKE 1 TABLET (100 MG TOTAL) BY MOUTH IN THE MORNING. TAKE WITH OR IMMEDIATELY FOLLOWING A MEAL. 90 tablet 1 09/19/2023   nitroGLYCERIN (NITROSTAT) 0.4 MG SL tablet Place 1 tablet (0.4 mg total) under the tongue every 5 (five) minutes x 3 doses as needed for chest pain. 10 tablet 12 unknown   tamsulosin (FLOMAX) 0.4 MG CAPS capsule Take 1 capsule (0.4 mg total) by mouth daily after supper. (Patient not taking: Reported on 09/20/2023) 30 capsule 3 Not Taking    Assessment: Pharmacy consulted to dose heparin in patient with atrial fibrillation.  Patient was on apixaban with last dose 9/22 0824. Will need to monitor based on aPTT until correlation with heparin levels.  CBC WNL  Goal of Therapy:  Heparin level 0.3-0.7 units/ml aPTT 66-102 seconds Monitor platelets by anticoagulation protocol: Yes   Plan:  Start heparin at 2030 due to last apixaban dose this AM. Start heparin infusion at 1450 units/hr Check anti-Xa level in 8 hours and daily while on heparin Continue to monitor H&H and platelets  Judeth Cornfield, PharmD Clinical Pharmacist 09/21/2023 2:36 PM

## 2023-09-21 NOTE — Progress Notes (Signed)
Rapid response called on patient after he was noted to be unresponsive by wife at bedside.  He quickly regained consciousness and was answering questions appropriately and moving all 4 limbs and tongue.  Vital signs were stable and blood glucose was above 100.  He was noted to be somewhat pale and diaphoretic and temperature was 98.5 Fahrenheit.  Stat labs obtained which are currently pending.  Few minutes later, vital signs were being reassessed and his blood pressures were in the 70s systolic.  He continues to remain responsive, but slightly disoriented.  Chest x-ray and CT head will be obtained and patient will be transferred to stepdown unit.  1 L fluid bolus ordered and blood pressures will be reassessed after bolus for consideration of repeat bolus and/or pressor use.  Antibiotics switched from Rocephin to Zosyn and vancomycin.  EKG reviewed and in sinus rhythm with no signs of ischemic changes.  Wife was reassured in waiting area.  Total critical care time: 40 minutes.

## 2023-09-21 NOTE — Progress Notes (Signed)
Pharmacy Antibiotic Note  Timothy Mata is a 89 y.o. male admitted on 09/20/2023 with sepsis.  Pharmacy has been consulted for vancomycin and zosyn dosing.  Plan: Vancomycin 2000 mg IV x 1 dose. Vancomycin 1500 mg IV every 48 hours Zosyn 3.375g IV q8h (4 hour infusion). Monitor labs, c/s, and vanco levels as indicated.  Height: 5\' 10"  (177.8 cm) Weight: 111.6 kg (246 lb 0.5 oz) IBW/kg (Calculated) : 73  Temp (24hrs), Avg:98.4 F (36.9 C), Min:97.7 F (36.5 C), Max:99.6 F (37.6 C)  Recent Labs  Lab 09/20/23 1319 09/20/23 1607 09/21/23 0449 09/21/23 1234  WBC 28.3*  --  22.2* 32.2*  CREATININE 2.26*  --  1.95* 1.94*  LATICACIDVEN  --  1.8  --  3.0*    Estimated Creatinine Clearance: 33.5 mL/min (A) (by C-G formula based on SCr of 1.94 mg/dL (H)).    No Known Allergies  Antimicrobials this admission: Vanco 9/22 >> Zosyn 9/22 >>  CTX 9/21 >> 9/22  Microbiology results: 9/21 BCx: gram + cocci in 1 bottle 9/22 Ucx: pending 9/22 MRSA PCR: pending   Thank you for allowing pharmacy to be a part of this patient's care.  Tad Moore 09/21/2023 2:23 PM

## 2023-09-21 NOTE — Progress Notes (Signed)
PROGRESS NOTE    Timothy Mata  ZOX:[id number] DOB: February 14, 1933 DOA: 09/22/23 PCP: Royann Shivers, PA-C   Brief Narrative:    Timothy Mata is a 89 y.o. male with medical history significant of Aflutter on eliquis and amiodarone, BPH, COPD, HTN.  Patient seen for 1 week of abdominal pain that has been increasing since it started last week.  He has been admitted with sepsis, POA secondary to UTI with cultures pending  Assessment & Plan:   Principal Problem:   Sepsis secondary to UTI [facility]) Active Problems:   Essential hypertension   Benign prostatic hyperplasia   HFrEF (heart failure with reduced ejection fraction) (HCC)   COPD (chronic obstructive pulmonary disease) (HCC)   Stage 3a chronic kidney disease (CKD) (HCC)  Assessment and Plan:   Sepsis, POA, secondary to UTI Urine culture ordered, unfortunately not obtained on admission, blood cultures pending Rocephin to continue CBC tomorrow Afib/flutter Continue amiodarone and anticoagulatoin HTN Continue antihypertensives BPH Continue Flomax HFrER Lasix daily COPD Stable Continue ICS/LABA CKD 3a Improvement in creatinine noted Hold further IV fluid 8.   Obesity  BMI 35.02    DVT prophylaxis:apixaban Code Status: Full Family Communication: Wife at bedside 9/22 Disposition Plan:  Status is: Inpatient Remains inpatient appropriate because: Need for IV medications.   Consultants:  None  Procedures:  None  Antimicrobials:  Anti-infectives (From admission, onward)    Start     Dose/Rate Route Frequency Ordered Stop   09/21/23 1500  cefTRIAXone (ROCEPHIN) 1 g in sodium chloride 0.9 % 100 mL IVPB        1 g 200 mL/hr over 30 Minutes Intravenous Every 24 hours 22-Sep-2023 1713     September 22, 2023 1530  cefTRIAXone (ROCEPHIN) 1 g in sodium chloride 0.9 % 100 mL IVPB        1 g 200 mL/hr over 30 Minutes Intravenous  Once September 22, 2023 1519 2023/09/22 1714       Subjective: Patient seen and evaluated today with no  new acute complaints or concerns. No acute concerns or events noted overnight.  He denies any further abdominal pain.  Objective: Vitals:   22-Sep-2023 1714 09/22/2023 1800 2023-09-22 2146 09/21/23 0538  BP: (!) 183/70 (!) 176/65 (!) 182/59 (!) 171/60  Pulse: 63 64 66 75  Resp: 20 19 20 20   Temp: 98.5 F (36.9 C) 98.2 F (36.8 C) 98.4 F (36.9 C) 99.6 F (37.6 C)  TempSrc:  Oral Oral   SpO2: 93% 97% 99% 93%  Weight:  110.7 kg    Height:  5\' 10"  (1.778 m)      Intake/Output Summary (Last 24 hours) at 09/21/2023 0740 Last data filed at 09/21/2023 0538 Gross per 24 hour  Intake 800 ml  Output 1500 ml  Net -700 ml   Filed Weights   22-Sep-2023 1242 22-Sep-2023 1800  Weight: 111.1 kg 110.7 kg    Examination:  General exam: Appears calm and comfortable  Respiratory system: Clear to auscultation. Respiratory effort normal. Cardiovascular system: S1 & S2 heard, RRR.  Gastrointestinal system: Abdomen is soft Central nervous system: Alert and awake Extremities: No edema Skin: No significant lesions noted Psychiatry: Flat affect.    Data Reviewed: I have personally reviewed following labs and imaging studies  CBC: Recent Labs  Lab Sep 22, 2023 1319 09/21/23 0449  WBC 28.3* 22.2*  HGB 13.0 11.6*  HCT 41.7 36.0*  MCV 92.7 91.4  PLT 245 246   Basic Metabolic Panel: Recent Labs  Lab 09-22-2023 1319 09/21/23 0449  NA 137  137  K 3.5 3.5  CL 101 103  CO2 26 23  GLUCOSE 131* 100*  BUN 39* 35*  CREATININE 2.26* 1.95*  CALCIUM 8.1* 7.6*   GFR: Estimated Creatinine Clearance: 33.3 mL/min (A) (by C-G formula based on SCr of 1.95 mg/dL (H)). Liver Function Tests: Recent Labs  Lab 09-30-23 1319  AST 19  ALT 16  ALKPHOS 105  BILITOT 1.4*  PROT 6.1*  ALBUMIN 2.7*   Recent Labs  Lab 09/30/23 1319  LIPASE 27   No results for input(s): "AMMONIA" in the last 168 hours. Coagulation Profile: No results for input(s): "INR", "PROTIME" in the last 168 hours. Cardiac Enzymes: No  results for input(s): "CKTOTAL", "CKMB", "CKMBINDEX", "TROPONINI" in the last 168 hours. BNP (last 3 results) No results for input(s): "PROBNP" in the last 8760 hours. HbA1C: No results for input(s): "HGBA1C" in the last 72 hours. CBG: No results for input(s): "GLUCAP" in the last 168 hours. Lipid Profile: No results for input(s): "CHOL", "HDL", "LDLCALC", "TRIG", "CHOLHDL", "LDLDIRECT" in the last 72 hours. Thyroid Function Tests: No results for input(s): "TSH", "T4TOTAL", "FREET4", "T3FREE", "THYROIDAB" in the last 72 hours. Anemia Panel: No results for input(s): "VITAMINB12", "FOLATE", "FERRITIN", "TIBC", "IRON", "RETICCTPCT" in the last 72 hours. Sepsis Labs: Recent Labs  Lab September 30, 2023 1607  LATICACIDVEN 1.8    Recent Results (from the past 240 hour(s))  Blood culture (routine x 2)     Status: None (Preliminary result)   Collection Time: 09-30-2023  4:07 PM   Specimen: BLOOD LEFT HAND  Result Value Ref Range Status   Specimen Description   Final    BLOOD LEFT HAND BOTTLES DRAWN AEROBIC AND ANAEROBIC   Special Requests   Final    Blood Culture adequate volume Performed at [facility], [address], [place], [place] [address]    Culture PENDING  Incomplete   Report Status PENDING  Incomplete  Blood culture (routine x 2)     Status: None (Preliminary result)   Collection Time: 30-Sep-2023  4:07 PM   Specimen: Right Antecubital; Blood  Result Value Ref Range Status   Specimen Description   Final    RIGHT ANTECUBITAL BOTTLES DRAWN AEROBIC AND ANAEROBIC   Special Requests   Final    Blood Culture adequate volume Performed at [facility], [address] [place], [place] [address]    Culture PENDING  Incomplete   Report Status PENDING  Incomplete         Radiology Studies: DG Chest 2 View  Result Date: 30-Sep-2023 CLINICAL DATA:  weakness. EXAM: CHEST - 2 VIEW COMPARISON:  07/04/2022. FINDINGS: There are probable bilateral layering pleural effusions. Mild pulmonary  vascular congestion. No acute consolidation or lung collapse. Moderately enlarged cardio-mediastinal silhouette. No acute osseous abnormalities. The soft tissues are within normal limits. IMPRESSION: *Cardiomegaly, mild pulmonary vascular congestion and probable bilateral layering pleural effusions, favoring congestive heart failure/pulmonary edema. Electronically Signed   By: Jules Schick M.D.   On: 09-30-2023 16:26   CT ABDOMEN PELVIS WO CONTRAST  Result Date: 09/30/2023 CLINICAL DATA:  Abdominal pain, acute, nonlocalized +UTI, WBC of 28k. EXAM: CT ABDOMEN AND PELVIS WITHOUT CONTRAST TECHNIQUE: Multidetector CT imaging of the abdomen and pelvis was performed following the standard protocol without IV contrast. RADIATION DOSE REDUCTION: This exam was performed according to the departmental dose-optimization program which includes automated exposure control, adjustment of the mA and/or kV according to patient size and/or use of iterative reconstruction technique. COMPARISON:  CT scan abdomen and pelvis report  from 08/03/2013. Please note, images are not available for review. FINDINGS: Lower chest: The lung bases are clear. There are bilateral small pleural effusions. The heart is normal in size. No pericardial effusion. Hepatobiliary: The liver is normal in size. Non-cirrhotic configuration. No suspicious mass. No intrahepatic or extrahepatic bile duct dilation. No calcified gallstones. Normal gallbladder wall thickness. No pericholecystic inflammatory changes. Pancreas: Unremarkable. No pancreatic ductal dilatation or surrounding inflammatory changes. Spleen: Within normal limits. No focal lesion. Adrenals/Urinary Tract: Adrenal glands are unremarkable. No suspicious renal mass within the limitations of this unenhanced exam. Bilateral kidneys exhibit mild diffuse cortical atrophy. Bilateral extrarenal pelvis noted. There is mild left hydronephrosis and entire hydroureter without discrete obstructing mass or  ureterolithiasis. Findings are likely secondary to chronic vesicoureteric reflux. No right hydroureteronephrosis. Diffuse trabeculations and several wide necked bladder diverticula noted, suggesting sequela of chronic urinary outflow obstruction. Stomach/Bowel: No disproportionate dilation of the small or large bowel loops. No evidence of abnormal bowel wall thickening or inflammatory changes. The appendix is unremarkable. Vascular/Lymphatic: No ascites or pneumoperitoneum. No abdominal or pelvic lymphadenopathy, by size criteria. No aneurysmal dilation of the major abdominal arteries. There are moderate peripheral atherosclerotic vascular calcifications of the aorta and its major branches. Reproductive: Enlarged prostate. Symmetric seminal vesicles. Other: There are fat containing umbilical and bilateral inguinal hernias. The soft tissues and abdominal wall are otherwise unremarkable. Musculoskeletal: No suspicious osseous lesions. Multiple old healed posteromedial fractures of right lower ribs as well as multiple rib ORIF noted. There are mild multilevel degenerative changes in the visualized spine. IMPRESSION: 1. No acute inflammatory process identified within the abdomen or pelvis. 2. Mild left hydroureteronephrosis without obstructing mass or ureterolithiasis. Findings are likely secondary to chronic vesicoureteric reflux. Enlarged prostate and bladder wall trabeculations/diverticula. 3. Bilateral small pleural effusions. 4. Multiple other nonacute observations, as described above. Electronically Signed   By: Jules Schick M.D.   On: 09/27/2023 16:24        Scheduled Meds:  [START ON 09/22/2023] amiodarone  200 mg Oral Once per day on Monday Tuesday Wednesday Thursday Friday Saturday   apixaban  2.5 mg Oral BID   aspirin  81 mg Oral Daily   atorvastatin  80 mg Oral q1800   fluticasone furoate-vilanterol  1 puff Inhalation Daily   furosemide  40 mg Oral Daily   levothyroxine  75 mcg Oral QAC  breakfast   metoprolol succinate  100 mg Oral q AM   tamsulosin  0.4 mg Oral QPC supper   Continuous Infusions:  sodium chloride 75 mL/hr at 09/21/2023 1828   cefTRIAXone (ROCEPHIN)  IV       LOS: 1 day    Time spent: 35 minutes    Addelyn Alleman Hoover Brunette, DO [facility]  If 7PM-7AM, please contact night-coverage [email or web address] 09/21/2023, 7:40 AM

## 2023-09-21 NOTE — Progress Notes (Addendum)
[facility] called this afternoon around 1330 and patient noted to have cardiac arrest.  I was in the room when patient became unresponsive and lost his pulse.  CPR was immediately started and patient was given 3 rounds of epinephrine with CPR.  ED came to bedside and intubated patient.  ROSC was achieved approximately 8-10 minutes after initiation of CPR.  Norepinephrine has been initiated due to persistent hypotension and patient now has a PICC line.  He is currently sedated on the ventilator with Precedex and arterial line is being attempted by respiratory.  Labs demonstrate worsening leukocytosis as well as lactic acidosis and he is noted to have some mild hypokalemia.  EKG with some atrial flutter that has now spontaneously converted to sinus rhythm.  Call will be placed to PCCM for transfer.  Discussed case with Dr. Marchelle Gearing who accepts pt in transfer and request CT abdomen and change in sedation orders.  Critical care time: 70 minutes.

## 2023-09-22 ENCOUNTER — Inpatient Hospital Stay (HOSPITAL_COMMUNITY): Payer: Medicare HMO

## 2023-09-22 ENCOUNTER — Other Ambulatory Visit (HOSPITAL_COMMUNITY): Payer: Medicare HMO

## 2023-09-22 DIAGNOSIS — I469 Cardiac arrest, cause unspecified: Secondary | ICD-10-CM

## 2023-09-22 DIAGNOSIS — N390 Urinary tract infection, site not specified: Secondary | ICD-10-CM | POA: Diagnosis not present

## 2023-09-22 DIAGNOSIS — A419 Sepsis, unspecified organism: Secondary | ICD-10-CM | POA: Diagnosis not present

## 2023-09-22 LAB — CBC
HCT: 32.8 % — ABNORMAL LOW (ref 39.0–52.0)
Hemoglobin: 10.2 g/dL — ABNORMAL LOW (ref 13.0–17.0)
MCH: 29 pg (ref 26.0–34.0)
MCHC: 31.1 g/dL (ref 30.0–36.0)
MCV: 93.2 fL (ref 80.0–100.0)
Platelets: 240 10*3/uL (ref 150–400)
RBC: 3.52 MIL/uL — ABNORMAL LOW (ref 4.22–5.81)
RDW: 15.2 % (ref 11.5–15.5)
WBC: 39.6 10*3/uL — ABNORMAL HIGH (ref 4.0–10.5)
nRBC: 0 % (ref 0.0–0.2)

## 2023-09-22 LAB — BLOOD CULTURE ID PANEL (REFLEXED) - BCID2

## 2023-09-22 LAB — ECHOCARDIOGRAM COMPLETE
Area-P 1/2: 3.53 cm2
Height: 70 in
S' Lateral: 2.8 cm
Weight: 3950.64 oz

## 2023-09-22 LAB — HEMOGLOBIN A1C
Hgb A1c MFr Bld: 5.8 % — ABNORMAL HIGH (ref 4.8–5.6)
Mean Plasma Glucose: 120 mg/dL

## 2023-09-22 LAB — BASIC METABOLIC PANEL
Anion gap: 11 (ref 5–15)
Anion gap: 12 (ref 5–15)
BUN: 41 mg/dL — ABNORMAL HIGH (ref 8–23)
BUN: 52 mg/dL — ABNORMAL HIGH (ref 8–23)
CO2: 18 mmol/L — ABNORMAL LOW (ref 22–32)
CO2: 18 mmol/L — ABNORMAL LOW (ref 22–32)
Calcium: 6.2 mg/dL — CL (ref 8.9–10.3)
Calcium: 7.3 mg/dL — ABNORMAL LOW (ref 8.9–10.3)
Chloride: 111 mmol/L (ref 98–111)
Chloride: 106 mmol/L (ref 98–111)
Creatinine, Ser: 3.16 mg/dL — ABNORMAL HIGH (ref 0.61–1.24)
Creatinine, Ser: 4.1 mg/dL — ABNORMAL HIGH (ref 0.61–1.24)
GFR, Estimated: 18 mL/min — ABNORMAL LOW (ref >60)
GFR, Estimated: 13 mL/min — ABNORMAL LOW (ref >60)
Glucose, Bld: 78 mg/dL (ref 70–99)
Glucose, Bld: 128 mg/dL — ABNORMAL HIGH (ref 70–99)
Potassium: 3.9 mmol/L (ref 3.5–5.1)
Potassium: 4.2 mmol/L (ref 3.5–5.1)
Sodium: 140 mmol/L (ref 135–145)
Sodium: 136 mmol/L (ref 135–145)

## 2023-09-22 LAB — URINE CULTURE: Culture: <10000 — AB

## 2023-09-22 LAB — COMPREHENSIVE METABOLIC PANEL
ALT: 88 U/L — ABNORMAL HIGH (ref 0–44)
AST: 108 U/L — ABNORMAL HIGH (ref 15–41)
Albumin: 1.6 g/dL — ABNORMAL LOW (ref 3.5–5.0)
Alkaline Phosphatase: 95 U/L (ref 38–126)
Anion gap: 9 (ref 5–15)
BUN: 42 mg/dL — ABNORMAL HIGH (ref 8–23)
CO2: 22 mmol/L (ref 22–32)
Calcium: 7.2 mg/dL — ABNORMAL LOW (ref 8.9–10.3)
Chloride: 104 mmol/L (ref 98–111)
Creatinine, Ser: 3.15 mg/dL — ABNORMAL HIGH (ref 0.61–1.24)
GFR, Estimated: 18 mL/min — ABNORMAL LOW (ref >60)
Glucose, Bld: 109 mg/dL — ABNORMAL HIGH (ref 70–99)
Potassium: 4.6 mmol/L (ref 3.5–5.1)
Sodium: 135 mmol/L (ref 135–145)
Total Bilirubin: 1.3 mg/dL — ABNORMAL HIGH (ref 0.3–1.2)
Total Protein: 4.5 g/dL — ABNORMAL LOW (ref 6.5–8.1)

## 2023-09-22 LAB — HEPARIN LEVEL (UNFRACTIONATED): Heparin Unfractionated: >1.1 IU/mL — ABNORMAL HIGH (ref 0.30–0.70)

## 2023-09-22 LAB — GLUCOSE, CAPILLARY
Glucose-Capillary: 95 mg/dL (ref 70–99)
Glucose-Capillary: 96 mg/dL (ref 70–99)
Glucose-Capillary: 83 mg/dL (ref 70–99)
Glucose-Capillary: 75 mg/dL (ref 70–99)
Glucose-Capillary: 75 mg/dL (ref 70–99)
Glucose-Capillary: 80 mg/dL (ref 70–99)

## 2023-09-22 LAB — TROPONIN I (HIGH SENSITIVITY)
Troponin I (High Sensitivity): 194 ng/L (ref <18)
Troponin I (High Sensitivity): 138 ng/L (ref <18)

## 2023-09-22 LAB — LACTIC ACID, PLASMA: Lactic Acid, Venous: 1.6 mmol/L (ref 0.5–1.9)

## 2023-09-22 LAB — APTT: aPTT: 64 seconds — ABNORMAL HIGH (ref 24–36)

## 2023-09-22 LAB — MAGNESIUM: Magnesium: 2.5 mg/dL — ABNORMAL HIGH (ref 1.7–2.4)

## 2023-09-22 LAB — PROCALCITONIN: Procalcitonin: 7.92 ng/mL

## 2023-09-22 MED ORDER — APIXABAN 2.5 MG PO TABS
2.5000 mg | ORAL_TABLET | Freq: Two times a day (BID) | ORAL | Status: DC
  Administered 2023-09-22 – 2023-09-26 (×9): 2.5 mg via ORAL
  Filled 2023-09-22 (×9): qty 1

## 2023-09-22 MED ORDER — ASPIRIN 81 MG PO CHEW
81.0000 mg | CHEWABLE_TABLET | Freq: Every day | ORAL | Status: DC
  Administered 2023-09-23 – 2023-09-26 (×4): 81 mg via ORAL
  Filled 2023-09-22 (×4): qty 1

## 2023-09-22 MED ORDER — LEVOTHYROXINE SODIUM 75 MCG PO TABS
75.0000 ug | ORAL_TABLET | Freq: Every day | ORAL | Status: DC
  Administered 2023-09-23 – 2023-10-04 (×12): 75 ug via ORAL
  Filled 2023-09-22 (×12): qty 1

## 2023-09-22 MED ORDER — ACETAMINOPHEN 325 MG PO TABS
650.0000 mg | ORAL_TABLET | ORAL | Status: DC | PRN
  Administered 2023-09-23 – 2023-09-24 (×2): 650 mg via ORAL
  Filled 2023-09-22 (×2): qty 2

## 2023-09-22 MED ORDER — DOCUSATE SODIUM 100 MG PO CAPS: 100.0000 mg | ORAL_CAPSULE | Freq: Two times a day (BID) | ORAL | Status: DC | PRN

## 2023-09-22 MED ORDER — SODIUM CHLORIDE 0.9 % IV SOLN
2.0000 g | INTRAVENOUS | Status: AC
  Administered 2023-09-22 – 2023-09-26 (×5): 2 g via INTRAVENOUS
  Filled 2023-09-22 (×5): qty 20

## 2023-09-22 MED ORDER — AMIODARONE HCL 200 MG PO TABS
200.0000 mg | ORAL_TABLET | Freq: Every day | ORAL | Status: DC
  Administered 2023-09-22 – 2023-09-28 (×7): 200 mg
  Filled 2023-09-22 (×7): qty 1

## 2023-09-22 MED ORDER — LEVOTHYROXINE SODIUM 75 MCG PO TABS
75.0000 ug | ORAL_TABLET | Freq: Every day | ORAL | Status: DC
  Administered 2023-09-22: 75 ug
  Filled 2023-09-22: qty 1

## 2023-09-22 MED ORDER — POLYETHYLENE GLYCOL 3350 17 G PO PACK
17.0000 g | PACK | Freq: Every day | ORAL | Status: DC
  Administered 2023-09-24 – 2023-10-04 (×7): 17 g via ORAL
  Filled 2023-09-22 (×11): qty 1

## 2023-09-22 MED ORDER — FUROSEMIDE 10 MG/ML IJ SOLN
100.0000 mg | Freq: Once | INTRAVENOUS | Status: AC
  Administered 2023-09-22: 100 mg via INTRAVENOUS
  Filled 2023-09-22: qty 10

## 2023-09-22 MED ORDER — FUROSEMIDE 10 MG/ML IJ SOLN
80.0000 mg | Freq: Once | INTRAMUSCULAR | Status: AC
  Administered 2023-09-22: 80 mg via INTRAVENOUS
  Filled 2023-09-22: qty 8

## 2023-09-22 MED ORDER — ORAL CARE MOUTH RINSE
15.0000 mL | OROMUCOSAL | Status: DC
  Administered 2023-09-22 – 2023-10-04 (×40): 15 mL via OROMUCOSAL

## 2023-09-22 MED ORDER — ORAL CARE MOUTH RINSE: 15.0000 mL | OROMUCOSAL | Status: DC | PRN

## 2023-09-22 MED ORDER — ATORVASTATIN CALCIUM 80 MG PO TABS
80.0000 mg | ORAL_TABLET | Freq: Every day | ORAL | Status: DC
  Administered 2023-09-22 – 2023-10-03 (×12): 80 mg via ORAL
  Filled 2023-09-22 (×12): qty 1

## 2023-09-22 MED ORDER — CALCIUM GLUCONATE-NACL 1-0.675 GM/50ML-% IV SOLN
1.0000 g | Freq: Once | INTRAVENOUS | Status: AC
  Administered 2023-09-22: 1000 mg via INTRAVENOUS
  Filled 2023-09-22: qty 50

## 2023-09-22 NOTE — Progress Notes (Signed)
Echocardiogram 2D Echocardiogram has been performed.  Warren Lacy Kruz Chiu RDCS 09/22/2023, 9:22 AM

## 2023-09-22 NOTE — Progress Notes (Addendum)
NAME:  Timothy Mata, MRN:  [id number], DOB:  01-21-1933, LOS: 2 ADMISSION DATE:  09/21/2023, CONSULTATION DATE:  9/22 REFERRING MD:  Dr. Sherryll Burger, CHIEF COMPLAINT:  cardiac arrest   History of Present Illness:  Patient is a 89 yo M w/ pertinent PMH a flutter on Eliquis, COPD, HFrEF, HTN, HLD presents to [facility] ED on 21-Sep-2023 with abdominal pain.  Patient having 1 week of abdominal pain that progressively worsened.  Came to [facility] ED on 09/21/23.  Denies any fevers, N/V/D.  Has had increased urinary frequency.  CXR with mild pulm edema.  CT ABD/pelvis no acute abnormality; mild left hydroureteronephrosis; small bilateral pleural effusions.  WBC/LA elevated. UA with nitrite and large leukocytes. Cultures obtained and given IV fluids.  Patient started on Rocephin.    On 9/22 patient with increased hypotension.  Patient given IV fluids and transferred to ICU.  Patient had PEA arrest with ROSC in 10 minutes.  Patient intubated post CPR.  CT head with no acute abnormality.  CXR with pulmonary edema.  Transferring to [facility] ED.  PCCM consulted.  Pertinent  Medical History   Past Medical History:  Diagnosis Date   Atrial flutter (HCC)    a. s/p DCCV in 03/2022 b. repeat EKG at follow-up from DCCV showing atrial fibrillation   BPH (benign prostatic hyperplasia)    COPD (chronic obstructive pulmonary disease) (HCC)    Dysrhythmia    Gout    "left big toe"   History of kidney stones    HOH (hard of hearing)    Hypercholesterolemia    Hypertension    Preretinal fibrosis, left eye    Spinal headache 12/30/1972     Significant Hospital Events: Including procedures, antibiotic start and stop dates in addition to other pertinent events   2023-09-21 admitted to aph w/ sepsis and uti 9/22 pea arrest; transfer to [facility]  Interim History / Subjective:  Patient intubated on vent On minimal sedation; awake and following commands Levo 9 mcg  Objective   Blood pressure (!) 108/58, pulse 61, temperature 98 F (36.7 C),  temperature source Oral, resp. rate 16, height 5\' 10"  (1.778 m), weight 112 kg, SpO2 100%.    Vent Mode: PRVC FiO2 (%):  [40 %-100 %] 40 % Set Rate:  [15 bmp-18 bmp] 18 bmp Vt Set:  [580 mL] 580 mL PEEP:  [5 cmH20-8 cmH20] 8 cmH20 Plateau Pressure:  [10 cmH20-12 cmH20] 10 cmH20   Intake/Output Summary (Last 24 hours) at 09/22/2023 4098 Last data filed at 09/22/2023 0700 Gross per 24 hour  Intake 3006.67 ml  Output 875 ml  Net 2131.67 ml   Filed Weights   Sep 21, 2023 1800 09/21/23 1321 09/22/23 0425  Weight: 110.7 kg 111.6 kg 112 kg    Examination: General:   NAD; critically ill appearing on mech vent HEENT: MM pink/moist; ETT in place Neuro: Aox3; MAE; sedated; CV: s1s2, irregular rate 60s, no m/r/g PULM:  dim clear BS bilaterally; on mech vent PRVC GI: soft, bsx4 active  Extremities: cool/dry, ble edema  Skin: no rashes or lesions    Resolved Hospital Problem list     Assessment & Plan:   PEA arrest Septic shock UTI Plan: -continue pressors for MAP goal >65 -trend troponin and lactate -check ABG -K repleted; trend CMP and Mag and replete electrolytes as needed -echo -f/u bcx2, urine culture/UA, and trach culture -cont broad spectrum antibiotics -trend wbc/fever curve  Acute respiratory failure post arrest Pulmonary edema and small b/l pleural effusion Hx COPD Plan: -  rest on vent overnight LTVV strategy with tidal volumes of 6-8 cc/kg ideal body weight -check ABG and adjust settings accordingly  -Wean PEEP/FiO2 for SpO2 >92% -VAP bundle in place -Daily SAT and SBT -PAD protocol in place -wean sedation for RASS goal 0 to -1 -brovana/pulmicort; duoneb prn -s/p Lasix 40 mg 9/22, monitor urine output   Afib/flutter on eliquis and amio Plan: -heparin gtt -tele monitoring -hold home amio while rate 60s -trend electrolytes and replete as needed  Hypokalemia Elevated creat on ckd 3a Plan: -k repleted -check mag -Trend BMP / urinary output -Replace  electrolytes as indicated -Avoid nephrotoxic agents, ensure adequate renal perfusion  Shock liver Plan: -trend cmp  Anemia Plan: -trend cbc  Mild Hyperglycemia Plan: -cbg monitoring -hold on ssi for now -check a1c  Htn Hld HFrEF Plan: -hold home anti-hypertensives -resume statin and asa -lasix x1 -daily weights; strict I/o's   Best Practice (right click and "Reselect all SmartList Selections" daily)   Diet/type: NPO w/ meds via tube DVT prophylaxis: systemic heparin GI prophylaxis: H2B Lines: N/A; PICC line Foley:  Yes, and it is still needed Code Status:  full code Last date of multidisciplinary goals of care discussion [9/22 updated wife over phone]     LABS    PULMONARY Recent Labs  Lab 09/21/23 1952  PHART 7.373  PCO2ART 34.6  PO2ART 215*  HCO3 20.1  TCO2 21*  O2SAT 100    CBC Recent Labs  Lab 09/21/23 0449 09/21/23 1234 09/21/23 1639 09/21/23 1640 09/21/23 1952  HGB 11.6* 12.3* 10.9*  --  11.6*  HCT 36.0* 38.1* 34.7*  --  34.0*  WBC 22.2* 32.2* 37.6*  --   --   PLT 246 248 250 247  --     COAGULATION Recent Labs  Lab 09/21/23 1640  INR 1.7*    CARDIAC  No results for input(s): "TROPONINI" in the last 168 hours. No results for input(s): "PROBNP" in the last 168 hours.   CHEMISTRY Recent Labs  Lab 09/28/2023 1319 09/21/23 0449 09/21/23 1234 09/21/23 1640 09/21/23 1952 09/21/23 2246 09/22/23 0517  NA 137 137 136 135 134*  --  135  K 3.5 3.5 3.4* 3.9 4.4  --  4.6  CL 101 103 103 102  --   --  104  CO2 26 23 22  20*  --   --  22  GLUCOSE 131* 100* 135* 186*  --   --  109*  BUN 39* 35* 33* 36*  --   --  42*  CREATININE 2.26* 1.95* 1.94* 2.30*  --   --  3.15*  CALCIUM 8.1* 7.6* 7.8* 7.1*  --   --  7.2*  MG  --   --  2.2  --   --  2.1  --    Estimated Creatinine Clearance: 20.7 mL/min (A) (by C-G formula based on SCr of 3.15 mg/dL (H)).   LIVER Recent Labs  Lab 09/25/2023 1319 09/21/23 1640 09/22/23 0517  AST 19  102* 108*  ALT 16 75* 88*  ALKPHOS 105 96 95  BILITOT 1.4* 1.8* 1.3*  PROT 6.1* 5.1* 4.5*  ALBUMIN 2.7* 2.1* 1.6*  INR  --  1.7*  --      INFECTIOUS Recent Labs  Lab 09/21/23 1640 09/21/23 2246 09/22/23 0808  LATICACIDVEN 2.7* 1.4 1.6  PROCALCITON  --  7.92  --      ENDOCRINE CBG (last 3)  Recent Labs    09/21/23 2329 09/22/23 0344 09/22/23 0725  GLUCAP 107*  95 96         IMAGING x48h  - image(s) personally visualized  -   highlighted in bold DG Chest Port 1 View  Result Date: 09/21/2023 CLINICAL DATA:  Respiratory failure EXAM: PORTABLE CHEST 1 VIEW COMPARISON:  2:22 p.m. FINDINGS: Endotracheal tube seen 4.6 cm above the carina. Nasogastric tube extends into the upper abdomen beyond the margin of the examination. Right upper extremity PICC line has been placed with its tip within the superior cavoatrial junction. Small right and small to moderate left pleural effusions are present, enlarged on the left. No pneumothorax. Stable cardiomegaly. Pulmonary vascularity is normal. Multiple right rib ORIF again noted. No acute bone abnormality. IMPRESSION: 1. Support apparatus in appropriate position. 2. Small right and small to moderate left pleural effusions, enlarged on the left. Electronically Signed   By: Helyn Numbers M.D.   On: 09/21/2023 20:16   DG Chest Port 1 View  Result Date: 09/21/2023 CLINICAL DATA:  Post intubation EXAM: PORTABLE CHEST 1 VIEW COMPARISON:  09/21/2023, October 06, 2023, 07/04/2022 FINDINGS: Endotracheal tube tip is about 4.7 cm superior to the carina. Esophageal tube tip below the diaphragm and projects over the proximal stomach. Side port hole difficult to visualize. Cardiomegaly with vascular congestion. Suspected left pleural effusion. Probable right pleural scarring. Postsurgical changes of right lower ribs. IMPRESSION: 1. Endotracheal tube tip about 4.7 cm superior to the carina. 2. Esophageal tube tip below the diaphragm and projects over the proximal  stomach. 3. Cardiomegaly with vascular congestion and suspected left pleural effusion. Electronically Signed   By: Jasmine Pang M.D.   On: 09/21/2023 16:14   CT HEAD WO CONTRAST ( )  Result Date: 09/21/2023 CLINICAL DATA:  Neuro deficit, acute, stroke suspected. Patient was unresponsive. EXAM: CT HEAD WITHOUT CONTRAST TECHNIQUE: Contiguous axial images were obtained from the base of the skull through the vertex without intravenous contrast. RADIATION DOSE REDUCTION: This exam was performed according to the departmental dose-optimization program which includes automated exposure control, adjustment of the mA and/or kV according to patient size and/or use of iterative reconstruction technique. COMPARISON:  CT head without contrast FINDINGS: Brain: Mild atrophy and white matter changes are present. No acute infarct, hemorrhage, or mass lesion is present. The ventricles are of normal size. No significant extraaxial fluid collection is present. The brainstem and cerebellum are within normal limits. Midline structures are within normal limits. Vascular: Atherosclerotic calcifications are present within the cavernous internal carotid arteries bilaterally. No hyperdense vessel is present. Skull: Calvarium is intact. No focal lytic or blastic lesions are present. No significant extracranial soft tissue lesion is present. Sinuses/Orbits: Bilateral lens replacements are noted. Globes and orbits are otherwise unremarkable. Chronic bilateral maxillary sinus disease is present with chronic wall thickening. Minimal mucosal thickening is present bilaterally. No fluid levels are present. Mild mucosal thickening is present at the left sphenoid sinus. The paranasal sinuses are otherwise clear. The mastoid air cells are clear. IMPRESSION: 1. No acute intracranial abnormality. 2. Mild atrophy and white matter disease likely reflects the sequela of chronic microvascular ischemia. 3. Chronic bilateral maxillary sinus disease.  Electronically Signed   By: Marin Roberts M.D.   On: 09/21/2023 15:00   [place] EKG SITE RITE  Result Date: 09/21/2023 If [facility] image not attached, placement could not be confirmed due to current cardiac rhythm.  DG CHEST PORT 1 VIEW  Result Date: 09/21/2023 CLINICAL DATA:  [id number] Acute hypoxemic respiratory failure (HCC) [id number] EXAM: PORTABLE CHEST 1 VIEW COMPARISON:  06-Oct-2023 FINDINGS:  The cardiomediastinal silhouette is unchanged in contour. Favored RIGHT-sided pleural thickening. No pneumothorax. Central vascular congestion with mild diffuse interstitial prominence likely reflecting a degree of mild underlying pulmonary edema. Severe atherosclerotic calcifications. Status post ORIF of RIGHT-sided ribs. IMPRESSION: Similar appearance of mild pulmonary edema. Electronically Signed   By: Meda Klinefelter M.D.   On: 09/21/2023 13:35   DG Chest 2 View  Result Date: 2023/10/12 CLINICAL DATA:  weakness. EXAM: CHEST - 2 VIEW COMPARISON:  07/04/2022. FINDINGS: There are probable bilateral layering pleural effusions. Mild pulmonary vascular congestion. No acute consolidation or lung collapse. Moderately enlarged cardio-mediastinal silhouette. No acute osseous abnormalities. The soft tissues are within normal limits. IMPRESSION: *Cardiomegaly, mild pulmonary vascular congestion and probable bilateral layering pleural effusions, favoring congestive heart failure/pulmonary edema. Electronically Signed   By: Jules Schick M.D.   On: 10-12-23 16:26   CT ABDOMEN PELVIS WO CONTRAST  Result Date: 10-12-2023 CLINICAL DATA:  Abdominal pain, acute, nonlocalized +UTI, WBC of 28k. EXAM: CT ABDOMEN AND PELVIS WITHOUT CONTRAST TECHNIQUE: Multidetector CT imaging of the abdomen and pelvis was performed following the standard protocol without IV contrast. RADIATION DOSE REDUCTION: This exam was performed according to the departmental dose-optimization program which includes automated exposure  control, adjustment of the mA and/or kV according to patient size and/or use of iterative reconstruction technique. COMPARISON:  CT scan abdomen and pelvis report from 08/03/2013. Please note, images are not available for review. FINDINGS: Lower chest: The lung bases are clear. There are bilateral small pleural effusions. The heart is normal in size. No pericardial effusion. Hepatobiliary: The liver is normal in size. Non-cirrhotic configuration. No suspicious mass. No intrahepatic or extrahepatic bile duct dilation. No calcified gallstones. Normal gallbladder wall thickness. No pericholecystic inflammatory changes. Pancreas: Unremarkable. No pancreatic ductal dilatation or surrounding inflammatory changes. Spleen: Within normal limits. No focal lesion. Adrenals/Urinary Tract: Adrenal glands are unremarkable. No suspicious renal mass within the limitations of this unenhanced exam. Bilateral kidneys exhibit mild diffuse cortical atrophy. Bilateral extrarenal pelvis noted. There is mild left hydronephrosis and entire hydroureter without discrete obstructing mass or ureterolithiasis. Findings are likely secondary to chronic vesicoureteric reflux. No right hydroureteronephrosis. Diffuse trabeculations and several wide necked bladder diverticula noted, suggesting sequela of chronic urinary outflow obstruction. Stomach/Bowel: No disproportionate dilation of the small or large bowel loops. No evidence of abnormal bowel wall thickening or inflammatory changes. The appendix is unremarkable. Vascular/Lymphatic: No ascites or pneumoperitoneum. No abdominal or pelvic lymphadenopathy, by size criteria. No aneurysmal dilation of the major abdominal arteries. There are moderate peripheral atherosclerotic vascular calcifications of the aorta and its major branches. Reproductive: Enlarged prostate. Symmetric seminal vesicles. Other: There are fat containing umbilical and bilateral inguinal hernias. The soft tissues and abdominal  wall are otherwise unremarkable. Musculoskeletal: No suspicious osseous lesions. Multiple old healed posteromedial fractures of right lower ribs as well as multiple rib ORIF noted. There are mild multilevel degenerative changes in the visualized spine. IMPRESSION: 1. No acute inflammatory process identified within the abdomen or pelvis. 2. Mild left hydroureteronephrosis without obstructing mass or ureterolithiasis. Findings are likely secondary to chronic vesicoureteric reflux. Enlarged prostate and bladder wall trabeculations/diverticula. 3. Bilateral small pleural effusions. 4. Multiple other nonacute observations, as described above. Electronically Signed   By: Jules Schick M.D.   On: 2023/10/12 16:24      Critical care time: 45 minutes     Glendale Chard, DO  [facility] 09/22/2023, 8:32 AM  Please see [email or web address] for pager details.  From 7A-7P  if no response, please call [phone]. After hours, please call ELink [phone].   Xxxxxxxxxxxxxxxxxxxxxxxxxxxxxxxxxxxxxxxxxxxxxxxxxxxxxxxxxxxxxxxxxxxxxx  ATTESTATION & SIGNATURE   STAFF NOTE: I, Dr Lavinia Sharps have personally reviewed patient's available data, including medical history, events of note, physical examination and test results as part of my evaluation. I have discussed with resident/NP and other care providers such as pharmacist, RN and RRT.  In addition,  I personally evaluated patient and elicited key findings of   S: Date of admit 2023/09/29 with LOS 2 for today 09/22/2023 : Timothy Mata is  -he is currently afebrile.  He is doing pressure support wean and is requesting to be extubated.  He and his wife feel he is ready for extubation.  Wife did admit that he was a former smoker and a history of COPD not otherwise specified but is not on home oxygen.  He is very functional at home.  There is no known history of sleep apnea  His creatinine is worse though and his urine output is dropping.  His chest x-ray  looks a little wet.  He is 1.5-2 L volume overloaded since admission.  On DOAC At home for A Fib. And amio  O:  Blood pressure (!) 108/58, pulse 61, temperature 98 F (36.7 C), temperature source Oral, resp. rate 16, height 5\' 10"  (1.778 m), weight 112 kg, SpO2 100%.   Obese male.  He seems to have undiagnosed sleep apnea based on his jaw anatomy.  He is doing good pressure support wean.  Clear to auscultation bilaterally normal heart sounds.  Abdomen is soft.  Good inspiratory tidal volume of 1 L upon request for inspiration.  Good muscle strength through all extremities and head and neck.  [facility]-ICU was negative for delirium.   LABS    PULMONARY Recent Labs  Lab 09/21/23 1952  PHART 7.373  PCO2ART 34.6  PO2ART 215*  HCO3 20.1  TCO2 21*  O2SAT 100    CBC Recent Labs  Lab 09/21/23 0449 09/21/23 1234 09/21/23 1639 09/21/23 1640 09/21/23 1952  HGB 11.6* 12.3* 10.9*  --  11.6*  HCT 36.0* 38.1* 34.7*  --  34.0*  WBC 22.2* 32.2* 37.6*  --   --   PLT 246 248 250 247  --     COAGULATION Recent Labs  Lab 09/21/23 1640  INR 1.7*    CARDIAC  No results for input(s): "TROPONINI" in the last 168 hours. No results for input(s): "PROBNP" in the last 168 hours.   CHEMISTRY Recent Labs  Lab September 29, 2023 1319 09/21/23 0449 09/21/23 1234 09/21/23 1640 09/21/23 1952 09/21/23 2246 09/22/23 0517  NA 137 137 136 135 134*  --  135  K 3.5 3.5 3.4* 3.9 4.4  --  4.6  CL 101 103 103 102  --   --  104  CO2 26 23 22  20*  --   --  22  GLUCOSE 131* 100* 135* 186*  --   --  109*  BUN 39* 35* 33* 36*  --   --  42*  CREATININE 2.26* 1.95* 1.94* 2.30*  --   --  3.15*  CALCIUM 8.1* 7.6* 7.8* 7.1*  --   --  7.2*  MG  --   --  2.2  --   --  2.1  --    Estimated Creatinine Clearance: 20.7 mL/min (A) (by C-G formula based on SCr of 3.15 mg/dL (H)).   LIVER Recent Labs  Lab 09-29-23 1319 09/21/23 1640 09/22/23 0517  AST 19 102* 108*  ALT 16 75* 88*  ALKPHOS 105 96 95  BILITOT  1.4* 1.8* 1.3*  PROT 6.1* 5.1* 4.5*  ALBUMIN 2.7* 2.1* 1.6*  INR  --  1.7*  --      INFECTIOUS Recent Labs  Lab 09/21/23 1640 09/21/23 2246 09/22/23 0808  LATICACIDVEN 2.7* 1.4 1.6  PROCALCITON  --  7.92  --      ENDOCRINE CBG (last 3)  Recent Labs    09/21/23 2329 09/22/23 0344 09/22/23 0725  GLUCAP 107* 95 96         IMAGING x24h  - image(s) personally visualized  -   highlighted in bold DG Chest Port 1 View  Result Date: 09/21/2023 CLINICAL DATA:  Respiratory failure EXAM: PORTABLE CHEST 1 VIEW COMPARISON:  2:22 p.m. FINDINGS: Endotracheal tube seen 4.6 cm above the carina. Nasogastric tube extends into the upper abdomen beyond the margin of the examination. Right upper extremity PICC line has been placed with its tip within the superior cavoatrial junction. Small right and small to moderate left pleural effusions are present, enlarged on the left. No pneumothorax. Stable cardiomegaly. Pulmonary vascularity is normal. Multiple right rib ORIF again noted. No acute bone abnormality. IMPRESSION: 1. Support apparatus in appropriate position. 2. Small right and small to moderate left pleural effusions, enlarged on the left. Electronically Signed   By: Helyn Numbers M.D.   On: 09/21/2023 20:16   DG Chest Port 1 View  Result Date: 09/21/2023 CLINICAL DATA:  Post intubation EXAM: PORTABLE CHEST 1 VIEW COMPARISON:  09/21/2023, 09/23/2023, 07/04/2022 FINDINGS: Endotracheal tube tip is about 4.7 cm superior to the carina. Esophageal tube tip below the diaphragm and projects over the proximal stomach. Side port hole difficult to visualize. Cardiomegaly with vascular congestion. Suspected left pleural effusion. Probable right pleural scarring. Postsurgical changes of right lower ribs. IMPRESSION: 1. Endotracheal tube tip about 4.7 cm superior to the carina. 2. Esophageal tube tip below the diaphragm and projects over the proximal stomach. 3. Cardiomegaly with vascular congestion and  suspected left pleural effusion. Electronically Signed   By: Jasmine Pang M.D.   On: 09/21/2023 16:14   CT HEAD WO CONTRAST ( )  Result Date: 09/21/2023 CLINICAL DATA:  Neuro deficit, acute, stroke suspected. Patient was unresponsive. EXAM: CT HEAD WITHOUT CONTRAST TECHNIQUE: Contiguous axial images were obtained from the base of the skull through the vertex without intravenous contrast. RADIATION DOSE REDUCTION: This exam was performed according to the departmental dose-optimization program which includes automated exposure control, adjustment of the mA and/or kV according to patient size and/or use of iterative reconstruction technique. COMPARISON:  CT head without contrast FINDINGS: Brain: Mild atrophy and white matter changes are present. No acute infarct, hemorrhage, or mass lesion is present. The ventricles are of normal size. No significant extraaxial fluid collection is present. The brainstem and cerebellum are within normal limits. Midline structures are within normal limits. Vascular: Atherosclerotic calcifications are present within the cavernous internal carotid arteries bilaterally. No hyperdense vessel is present. Skull: Calvarium is intact. No focal lytic or blastic lesions are present. No significant extracranial soft tissue lesion is present. Sinuses/Orbits: Bilateral lens replacements are noted. Globes and orbits are otherwise unremarkable. Chronic bilateral maxillary sinus disease is present with chronic wall thickening. Minimal mucosal thickening is present bilaterally. No fluid levels are present. Mild mucosal thickening is present at the left sphenoid sinus. The paranasal sinuses are otherwise clear. The mastoid air cells are clear. IMPRESSION: 1. No acute intracranial abnormality. 2. Mild atrophy and white matter disease  likely reflects the sequela of chronic microvascular ischemia. 3. Chronic bilateral maxillary sinus disease. Electronically Signed   By: Marin Roberts M.D.   On:  09/21/2023 15:00   [place] EKG SITE RITE  Result Date: 09/21/2023 If [facility] image not attached, placement could not be confirmed due to current cardiac rhythm.  DG CHEST PORT 1 VIEW  Result Date: 09/21/2023 CLINICAL DATA:  [id number] Acute hypoxemic respiratory failure (HCC) [id number] EXAM: PORTABLE CHEST 1 VIEW COMPARISON:  09/27/23 FINDINGS: The cardiomediastinal silhouette is unchanged in contour. Favored RIGHT-sided pleural thickening. No pneumothorax. Central vascular congestion with mild diffuse interstitial prominence likely reflecting a degree of mild underlying pulmonary edema. Severe atherosclerotic calcifications. Status post ORIF of RIGHT-sided ribs. IMPRESSION: Similar appearance of mild pulmonary edema. Electronically Signed   By: Meda Klinefelter M.D.   On: 09/21/2023 13:35      A:  Acute respiratory failure following sepsis and cardiac arrest -doing well on SBT but risk factors for reintubation include volume overload and kidney injury and age Cardiac arrest secondary to sepsis Septic shock secondary to UTI  -  UTI community-acquired [Staph epi in one of the blood culture bottles probably contaminant but cultures pending] Acute kidney injury secondary to sepsis and baseline chronic kidney disease [does have associated ureteric stone but no hydro at admission yesterday]  -Kidney injury is worse and could reflect low flow state as opposed to hydro onset  -Vancomycin risk factor for worsening kidney injury  A Fib/Flutter at hme   P: -  Lasix 80 mg x 1 IV and then extubate to BiPAP given previous history of COPD -Continue bronchodilators -Get renal ultrasound to look for any new onset of hide on the last 24 hours -Discontinue vancomycin [low pretest probably for staph epi being the cause of his UTI] -De-escalate Zosyn to ceftriaxone -Await cultures -Continue monitoring in the intensive care unit because of significant risk for reintubation   - A fib: Continue IV  heparin.  And slowly reintroduce home amiodarone.  Hold off on reintroducing beta-blocker  Anti-infectives (From admission, onward)    Start     Dose/Rate Route Frequency Ordered Stop   09/23/23 1300  vancomycin (VANCOREADY) IVPB 1500 mg/300 mL        1,500 mg 150 mL/hr over 120 Minutes Intravenous Every 48 hours 09/21/23 1339     09/21/23 2200  piperacillin-tazobactam (ZOSYN) IVPB 3.375 g        3.375 g 12.5 mL/hr over 240 Minutes Intravenous Every 8 hours 09/21/23 1319     09/21/23 1500  cefTRIAXone (ROCEPHIN) 1 g in sodium chloride 0.9 % 100 mL IVPB  Status:  Discontinued        1 g 200 mL/hr over 30 Minutes Intravenous Every 24 hours 09-27-23 1713 09/21/23 1245   09/21/23 1500  cefTRIAXone (ROCEPHIN) 2 g in sodium chloride 0.9 % 100 mL IVPB  Status:  Discontinued        2 g 200 mL/hr over 30 Minutes Intravenous Every 24 hours 09/21/23 1245 09/21/23 1251   09/21/23 1400  vancomycin (VANCOREADY) IVPB 2000 mg/400 mL        2,000 mg 200 mL/hr over 120 Minutes Intravenous  Once 09/21/23 1252 09/21/23 1639   09/21/23 1345  piperacillin-tazobactam (ZOSYN) IVPB 3.375 g        3.375 g 100 mL/hr over 30 Minutes Intravenous  Once 09/21/23 1252 09/21/23 1354   Sep 27, 2023 1530  cefTRIAXone (ROCEPHIN) 1 g in sodium chloride 0.9 % 100 mL IVPB  1 g 200 mL/hr over 30 Minutes Intravenous  Once 09/05/2023 1519 09/22/2023 1714        Rest per NP/medical resident whose note is outlined above and that I agree with  The patient is critically ill with multiple organ systems failure and requires high complexity decision making for assessment and support, frequent evaluation and titration of therapies, application of advanced monitoring technologies and extensive interpretation of multiple databases.   Critical Care Time devoted to patient care services described in this note is  30  Minutes. This time reflects time of care of this signee Dr Kalman Shan. This critical care time does not reflect  procedure time, or teaching time or supervisory time of PA/NP/Med student/Med Resident etc but could involve care discussion time     Dr. Kalman Shan, M.D., [facility].C.P Pulmonary and Critical Care Medicine Staff Physician [facility] Pulmonary and Critical Care Pager: [phone], If no answer or between  15:00h - 7:00h: call [phone]  09/22/2023 9:26 AM

## 2023-09-22 NOTE — Plan of Care (Signed)
  Problem: Education: Goal: Knowledge of General Education information will improve Description: Including pain rating scale, medication(s)/side effects and non-pharmacologic comfort measures Outcome: Progressing   Problem: Health Behavior/Discharge Planning: Goal: Ability to manage health-related needs will improve Outcome: Progressing   Problem: Clinical Measurements: Goal: Ability to maintain clinical measurements within normal limits will improve Outcome: Progressing Goal: Will remain free from infection Outcome: Progressing Goal: Respiratory complications will improve Outcome: Progressing Goal: Cardiovascular complication will be avoided Outcome: Progressing   Problem: Coping: Goal: Level of anxiety will decrease Outcome: Progressing   Problem: Pain Managment: Goal: General experience of comfort will improve Outcome: Progressing   Problem: Safety: Goal: Ability to remain free from injury will improve Outcome: Progressing   Problem: Skin Integrity: Goal: Risk for impaired skin integrity will decrease Outcome: Progressing   Problem: Safety: Goal: Non-violent Restraint(s) Outcome: Progressing   Problem: Coping: Goal: Ability to adjust to condition or change in health will improve Outcome: Progressing   Problem: Metabolic: Goal: Ability to maintain appropriate glucose levels will improve Outcome: Progressing   Problem: Skin Integrity: Goal: Risk for impaired skin integrity will decrease Outcome: Progressing   Problem: Tissue Perfusion: Goal: Adequacy of tissue perfusion will improve Outcome: Progressing

## 2023-09-22 NOTE — Procedures (Signed)
Extubation Procedure Note  Patient Details:   Name: Timothy Mata DOB: [birth year] MRN: [id number]   Airway Documentation:    Vent end date: 09/22/23 Vent end time: 0955   Evaluation  O2 sats: stable throughout Complications: No apparent complications Patient did tolerate procedure well. Bilateral Breath Sounds: Clear, Diminished   Yes  Pt extubated to bipap per physician order. Pt suctioned via ETT and orally prior, positive cuff leak. Upon extubation pt able to speak name, give a good cough and no stridor was heard. Pt was then placed on bipap 8/5 40% without complication.   Derinda Late 09/22/2023, 9:59 AM

## 2023-09-22 NOTE — Progress Notes (Signed)
220 mL Fentanyl wasted in stericycle with Tedra Coupe, RN.

## 2023-09-22 NOTE — Progress Notes (Addendum)
PHARMACY - PHYSICIAN COMMUNICATION CRITICAL VALUE ALERT - BLOOD CULTURE IDENTIFICATION (BCID)  Timothy Mata is an 89 y.o. male who presented to [facility] on 09/20/2023 with a chief complaint of abdominal pain and PEA arrest  Assessment:   1/2 blood cultures drawn 9/21 at [facility] growing Staphylococcus epidermidis, likely contaminant  Name of physician (or Provider) Contacted:  Dr. Delia Chimes  Current antibiotics:  Vancomycin and Zosyn   Changes to prescribed antibiotics recommended:  No changes needed at this time  Results for orders placed or performed during the hospital encounter of 09/20/23  Blood Culture ID Panel (Reflexed) (Collected: 09/20/2023  4:07 PM)  Result Value Ref Range   Enterococcus faecalis NOT DETECTED NOT DETECTED   Enterococcus Faecium NOT DETECTED NOT DETECTED   Listeria monocytogenes NOT DETECTED NOT DETECTED   Staphylococcus species DETECTED (A) NOT DETECTED   Staphylococcus aureus (BCID) NOT DETECTED NOT DETECTED   Staphylococcus epidermidis DETECTED (A) NOT DETECTED   Staphylococcus lugdunensis NOT DETECTED NOT DETECTED   Streptococcus species NOT DETECTED NOT DETECTED   Streptococcus agalactiae NOT DETECTED NOT DETECTED   Streptococcus pneumoniae NOT DETECTED NOT DETECTED   Streptococcus pyogenes NOT DETECTED NOT DETECTED   A.calcoaceticus-baumannii NOT DETECTED NOT DETECTED   Bacteroides fragilis NOT DETECTED NOT DETECTED   Enterobacterales NOT DETECTED NOT DETECTED   Enterobacter cloacae complex NOT DETECTED NOT DETECTED   Escherichia coli NOT DETECTED NOT DETECTED   Klebsiella aerogenes NOT DETECTED NOT DETECTED   Klebsiella oxytoca NOT DETECTED NOT DETECTED   Klebsiella pneumoniae NOT DETECTED NOT DETECTED   Proteus species NOT DETECTED NOT DETECTED   Salmonella species NOT DETECTED NOT DETECTED   Serratia marcescens NOT DETECTED NOT DETECTED   Haemophilus influenzae NOT DETECTED NOT DETECTED   Neisseria meningitidis NOT DETECTED NOT DETECTED    Pseudomonas aeruginosa NOT DETECTED NOT DETECTED   Stenotrophomonas maltophilia NOT DETECTED NOT DETECTED   Candida albicans NOT DETECTED NOT DETECTED   Candida auris NOT DETECTED NOT DETECTED   Candida glabrata NOT DETECTED NOT DETECTED   Candida krusei NOT DETECTED NOT DETECTED   Candida parapsilosis NOT DETECTED NOT DETECTED   Candida tropicalis NOT DETECTED NOT DETECTED   Cryptococcus neoformans/gattii NOT DETECTED NOT DETECTED   Methicillin resistance mecA/C DETECTED (A) NOT DETECTED    Eddie Candle 09/22/2023  1:35 AM

## 2023-09-22 NOTE — TOC CM/SW Note (Addendum)
Transition of Care [facility]) - Inpatient Brief Assessment   Patient Details  Name: Timothy Mata MRN: [id number] Date of Birth: 12-24-1933  Transition of Care [facility]) CM/SW Contact:    Christophere-Johnson, Hershal Coria, RN Phone Number: 09/22/2023, 2:49 PM   Clinical Narrative:  Patient presented to the [facility] ED with worsening Abdominal Pain that progressively worsened. CXR shows mild Pulmonary Edema.  CT ABD/pelvis shows mild Left Hydroureteronephrosis and small Bilateral Pleural Effusions. Patient developed increased Hypotension on 09/21/23, went into PEA Arrest, intubated and transferred to [facility] for further eval and Management. Admitted with Sepsis 2/2 UTI.  Patient extubated today and placed on BIPAP. Continues on IV abx. On Eliquis for A-Flutter.    No TOC needs or recommendations noted at this time.  Patient not Medically ready for discharge.  CM will continue to follow as patient progresses with care towards discharge.             Transition of Care Asessment: Insurance and Status: Insurance coverage has been reviewed Patient has primary care physician: Yes Home environment has been reviewed: UTA, patient just extubated Prior level of function:: UTA, patient just extubated Prior/Current Home Services:  (UTA) Social Determinants of Health Reivew:  (UTA) Readmission risk has been reviewed: Yes Transition of care needs: transition of care needs identified, TOC will continue to follow

## 2023-09-22 NOTE — Progress Notes (Signed)
BEDSIDE Physician Progress Note and Electrolyte Replacement  Patient Name: Timothy Mata DOB: 04/27/1933 MRN: [id number]  Date of Service  09/22/2023   HPI/Events of Note   Recent Labs  Lab 09/21/23 0449 09/21/23 1234 09/21/23 1640 09/21/23 1952 09/21/23 2246 09/22/23 0517 09/22/23 1412  NA 137 136 135 134*  --  135 140  K 3.5 3.4* 3.9 4.4  --  4.6 3.9  CL 103 103 102  --   --  104 111  CO2 23 22 20*  --   --  22 18*  GLUCOSE 100* 135* 186*  --   --  109* 78  BUN 35* 33* 36*  --   --  42* 41*  CREATININE 1.95* 1.94* 2.30*  --   --  3.15* 3.16*  CALCIUM 7.6* 7.8* 7.1*  --   --  7.2* 6.2*  MG  --  2.2  --   --  2.1 2.5*  --     Estimated Creatinine Clearance: 20.6 mL/min (A) (by C-G formula based on SCr of 3.16 mg/dL (H)).  Intake/Output      09/22 0701 09/23 0700 09/23 0701 09/24 0700   P.O. 240    I.V. (mL/kg) 1660.7 (14.8) 928.7 (8.3)   NG/GT 90    IV Piggyback 1016 125.4   Total Intake(mL/kg) 3006.7 (26.8) 1054.1 (9.4)   Urine (mL/kg/hr) 775 (0.3) 25 (0)   Emesis/NG output 100    Total Output 875 25   Net +2131.7 +1029.1         - I/O DETAILED x 24h    Total I/O In: 1054.1 [I.V.:928.7; IV Piggyback:125.4] Out: 25 [Urine:25] - I/O THIS SHIFT    ASSESSMENT Corrected calcium 8.1 Creat stable but no response to lasix  eICURN Interventions  1gm calcium gluconate Check ionized calcium in AM Recheck bmet at 9pm   ASSESSMENT: MAJOR ELECTROLYTE      Dr. Kalman Shan, M.D., [facility].C.P Pulmonary and Critical Care Medicine Staff Physician [facility] [place] Pulmonary and Critical Care Pager: [phone], If no answer or between  15:00h - 7:00h: call [phone]  09/22/2023 4:26 PM

## 2023-09-22 NOTE — Progress Notes (Signed)
eLink Physician-Brief Progress Note Patient Name: Timothy Mata DOB: [birth year] MRN: [id number]   Date of Service  09/22/2023  HPI/Events of Note  Patient is a 89 yo M w/ pertinent PMH a flutter on Eliquis, COPD, HFrEF, HTN, HLD presents to [facility] ED on 9/21 with abdominal pain. On 9/22 patient with increased hypotension. Patient given IV fluids and transferred to ICU. PEA arrest with ROSC in 10 minutes .  Initially hypotensive but patient continues to have worsening GFR/creatinine.  Poor urine output.  eICU Interventions  Additional Lasix x 1, goal for euvolemia or net negative.  Discontinue IV fluids.     Intervention Category Minor Interventions: Clinical assessment - ordering diagnostic tests  Timothy Mata 09/22/2023, 9:45 PM

## 2023-09-22 NOTE — Progress Notes (Signed)
ANTICOAGULATION CONSULT NOTE - Initial Consult  Pharmacy Consult for heparin gtt Indication: atrial fibrillation  No Known Allergies  Patient Measurements: Height: 5\' 10"  (177.8 cm) Weight: 112 kg (246 lb 14.6 oz) IBW/kg (Calculated) : 73 Heparin Dosing Weight: 97.4 kg  Vital Signs: Temp: 97.8 F (36.6 C) (09/22 2331) Temp Source: Oral (09/22 2331) BP: 104/57 (09/23 0615) Pulse Rate: 56 (09/23 0615)  Labs: Recent Labs    09/21/23 1234 09/21/23 1530 09/21/23 1639 09/21/23 1640 09/21/23 1952 09/21/23 2246 09/22/23 0517  HGB 12.3*  --  10.9*  --  11.6*  --   --   HCT 38.1*  --  34.7*  --  34.0*  --   --   PLT 248  --  250 247  --   --   --   APTT  --   --   --  31  --   --   --   LABPROT  --   --   --  19.9*  --   --   --   INR  --   --   --  1.7*  --   --   --   HEPARINUNFRC  --   --   --   --   --   --  >1.10*  CREATININE 1.94*  --   --  2.30*  --   --  3.15*  TROPONINIHS 42* 63*  --   --   --  159*  --     Estimated Creatinine Clearance: 20.7 mL/min (A) (by C-G formula based on SCr of 3.15 mg/dL (H)).   Medical History: Past Medical History:  Diagnosis Date   Atrial flutter (HCC)    a. s/p DCCV in 03/2022 b. repeat EKG at follow-up from DCCV showing atrial fibrillation   BPH (benign prostatic hyperplasia)    COPD (chronic obstructive pulmonary disease) (HCC)    Dysrhythmia    Gout    "left big toe"   History of kidney stones    HOH (hard of hearing)    Hypercholesterolemia    Hypertension    Preretinal fibrosis, left eye    Spinal headache 12/30/1972    Assessment: 39 YOM admitted with a chief complaint of abdominal pain with worsening hypotension ultimately with PEA arrest with ROSC after . He has a PMH positive for Atrial flutter maintained on Eliquis in the ambulatory setting. His last dose of Eliquis was 09/21/2023 at 08:24. Consult for heparin gtt received   aPTT 64- sub therapeutic  --not viewable in Epic d/t Sun[employer] issue  HL >1.10 -  falsely elevated given recent Eliquis use  Goal of Therapy:  Heparin level 0.3-0.7 units/ml aPTT 66-102 seconds Monitor platelets by anticoagulation protocol: Yes   Plan:  Increase heparin to 1550 units/hr Check aPTT in 8 hours and daily while on heparin Once aPTT and HL correlate, may discontinue aPTT monitoring Continue to monitor CBC, s/sx bleeding F/u transition back to enteral anticoagulation as appropriate  Calton Dach, PharmD, BCCCP Clinical Pharmacist 09/22/2023 6:21 AM

## 2023-09-23 DIAGNOSIS — I469 Cardiac arrest, cause unspecified: Secondary | ICD-10-CM | POA: Diagnosis not present

## 2023-09-23 DIAGNOSIS — N390 Urinary tract infection, site not specified: Secondary | ICD-10-CM | POA: Diagnosis not present

## 2023-09-23 DIAGNOSIS — A419 Sepsis, unspecified organism: Secondary | ICD-10-CM | POA: Diagnosis not present

## 2023-09-23 LAB — BASIC METABOLIC PANEL
Anion gap: 15 (ref 5–15)
BUN: 60 mg/dL — ABNORMAL HIGH (ref 8–23)
CO2: 18 mmol/L — ABNORMAL LOW (ref 22–32)
Calcium: 7.3 mg/dL — ABNORMAL LOW (ref 8.9–10.3)
Chloride: 100 mmol/L (ref 98–111)
Creatinine, Ser: 4.63 mg/dL — ABNORMAL HIGH (ref 0.61–1.24)
GFR, Estimated: 12 mL/min — ABNORMAL LOW (ref >60)
Glucose, Bld: 101 mg/dL — ABNORMAL HIGH (ref 70–99)
Potassium: 4.2 mmol/L (ref 3.5–5.1)
Sodium: 133 mmol/L — ABNORMAL LOW (ref 135–145)

## 2023-09-23 LAB — CULTURE, BLOOD (ROUTINE X 2): Special Requests: ADEQUATE

## 2023-09-23 LAB — GLUCOSE, CAPILLARY
Glucose-Capillary: 85 mg/dL (ref 70–99)
Glucose-Capillary: 92 mg/dL (ref 70–99)
Glucose-Capillary: 90 mg/dL (ref 70–99)
Glucose-Capillary: 120 mg/dL — ABNORMAL HIGH (ref 70–99)

## 2023-09-23 LAB — BLOOD GAS, VENOUS
Acid-base deficit: 9.5 mmol/L — ABNORMAL HIGH (ref 0.0–2.0)
Bicarbonate: 16.4 mmol/L — ABNORMAL LOW (ref 20.0–28.0)
O2 Saturation: 58.9 %
Patient temperature: 37
pCO2, Ven: 35 mmHg — ABNORMAL LOW (ref 44–60)
pH, Ven: 7.28 (ref 7.25–7.43)
pO2, Ven: 39 mmHg (ref 32–45)

## 2023-09-23 LAB — CBC
HCT: 31.1 % — ABNORMAL LOW (ref 39.0–52.0)
Hemoglobin: 9.7 g/dL — ABNORMAL LOW (ref 13.0–17.0)
MCH: 29 pg (ref 26.0–34.0)
MCHC: 31.2 g/dL (ref 30.0–36.0)
MCV: 92.8 fL (ref 80.0–100.0)
Platelets: 285 10*3/uL (ref 150–400)
RBC: 3.35 MIL/uL — ABNORMAL LOW (ref 4.22–5.81)
RDW: 15.4 % (ref 11.5–15.5)
WBC: 39.6 10*3/uL — ABNORMAL HIGH (ref 4.0–10.5)
nRBC: 0 % (ref 0.0–0.2)

## 2023-09-23 LAB — MAGNESIUM: Magnesium: 2.5 mg/dL — ABNORMAL HIGH (ref 1.7–2.4)

## 2023-09-23 LAB — STREP PNEUMONIAE URINARY ANTIGEN: Strep Pneumo Urinary Antigen: NEGATIVE

## 2023-09-23 LAB — PHOSPHORUS: Phosphorus: 5.5 mg/dL — ABNORMAL HIGH (ref 2.5–4.6)

## 2023-09-23 MED ORDER — ACYCLOVIR 5 % EX OINT
TOPICAL_OINTMENT | CUTANEOUS | Status: AC
  Filled 2023-09-23 (×2): qty 15
  Filled 2023-09-23: qty 5

## 2023-09-23 MED ORDER — ORAL CARE MOUTH RINSE: 15.0000 mL | OROMUCOSAL | Status: DC

## 2023-09-23 MED ORDER — ORAL CARE MOUTH RINSE: 15.0000 mL | OROMUCOSAL | Status: DC | PRN

## 2023-09-23 NOTE — Evaluation (Signed)
Physical Therapy Evaluation Patient Details Name: Timothy Mata MRN: [id number] DOB: [birth year] Today's Date: 09/23/2023  History of Present Illness  89 y.o. male admitted at [facility] 9/21 with abdominal pain, UTI, sepsis.  9/22 cardiac arrest,  PEA 8-10 min with transfer to [facility]. Intubation 9/22-9/23. PMHx: Aflutter on Eliquis and amiodarone, BPH, COPD, HTN, gout  Clinical Impression  Pt pleasant and reports chest soreness from compressions. Pt maintaining 93-96% on RA with noted increased WOB despite cues for decreased speed and breathing technique with activity. Pt with decreased activity tolerance and balance who is normally independent and performing yard work at baseline. Pt will benefit from acute therapy to maximize mobility, safety and function.   HR 71        If plan is discharge home, recommend the following: Assistance with cooking/housework   Can travel by private vehicle        Equipment Recommendations None recommended by PT  Recommendations for Other Services  OT consult    Functional Status Assessment Patient has had a recent decline in their functional status and demonstrates the ability to make significant improvements in function in a reasonable and predictable amount of time.     Precautions / Restrictions Precautions Precautions: Fall      Mobility  Bed Mobility               General bed mobility comments: pt sitting EOB on arrival, chair end of session    Transfers Overall transfer level: Needs assistance   Transfers: Sit to/from Stand Sit to Stand: Contact guard assist           General transfer comment: cues for hand placement with RW present    Ambulation/Gait Ambulation/Gait assistance: Contact guard assist Gait Distance (Feet): 150 Feet Assistive device: Rolling walker (2 wheels) Gait Pattern/deviations: Step-through pattern, Decreased stride length, Trunk flexed   Gait velocity interpretation: >2.62 ft/sec, indicative of community  ambulatory   General Gait Details: cues for posture, proximity to RW and breathing technique, pt rushing to finsh gait despite cues for rest and pace  Stairs            Wheelchair Mobility     Tilt Bed    Modified Rankin (Stroke Patients Only)       Balance Overall balance assessment: Needs assistance   Sitting balance-Leahy Scale: Good     Standing balance support: Bilateral upper extremity supported, During functional activity, Reliant on assistive device for balance Standing balance-Leahy Scale: Fair Standing balance comment: RW for gait                             Pertinent Vitals/Pain Pain Assessment Pain Assessment: 0-10 Pain Score: 4  Pain Location: chest Pain Descriptors / Indicators: Sore Pain Intervention(s): Limited activity within patient's tolerance, Repositioned, Monitored during session    Home Living Family/patient expects to be discharged to:: Private residence Living Arrangements: Spouse/significant other Available Help at Discharge: Family;Available 24 hours/day Type of Home: House Home Access: Stairs to enter   [employer] of Steps: 5   Home Layout: Multi-level;Able to live on main level with bedroom/bathroom Home Equipment: Rolling Walker (2 wheels);BSC/3in1;Cane - single point;Tub bench;Shower seat      Prior Function Prior Level of Function : Independent/Modified Independent;Driving                     Extremity/Trunk Assessment   Upper Extremity Assessment Upper Extremity Assessment: Overall WFL for tasks  assessed    Lower Extremity Assessment Lower Extremity Assessment: Overall WFL for tasks assessed    Cervical / Trunk Assessment Cervical / Trunk Assessment: Normal  Communication   Communication Communication: No apparent difficulties  Cognition Arousal: Alert Behavior During Therapy: WFL for tasks assessed/performed Overall Cognitive Status: Within Functional Limits for tasks assessed                                           General Comments      Exercises     Assessment/Plan    PT Assessment Patient needs continued PT services  PT Problem List Decreased mobility;Decreased activity tolerance;Decreased balance;Decreased knowledge of use of DME;Cardiopulmonary status limiting activity       PT Treatment Interventions DME instruction;Gait training;Functional mobility training;Therapeutic activities;Patient/family education;Stair training;Balance training;Therapeutic exercise    PT Goals (Current goals can be found in the Care Plan section)  Acute Rehab PT Goals Patient Stated Goal: return home, use mower and chainsaw PT Goal Formulation: With patient/family Time For Goal Achievement: 10/07/23 Potential to Achieve Goals: Good    Frequency Min 1X/week     Co-evaluation               AM-PAC PT "6 Clicks" Mobility  Outcome Measure Help needed turning from your back to your side while in a flat bed without using bedrails?: None Help needed moving from lying on your back to sitting on the side of a flat bed without using bedrails?: A Little Help needed moving to and from a bed to a chair (including a wheelchair)?: A Little Help needed standing up from a chair using your arms (e.g., wheelchair or bedside chair)?: A Little Help needed to walk in hospital room?: A Little Help needed climbing 3-5 steps with a railing? : A Little 6 Click Score: 19    End of Session Equipment Utilized During Treatment: Gait belt Activity Tolerance: Patient tolerated treatment well Patient left: in chair;with call bell/phone within reach;with chair alarm set;with family/visitor present Nurse Communication: Mobility status PT Visit Diagnosis: Other abnormalities of gait and mobility (R26.89);History of falling (Z91.81)    Time: 6962-9528 PT Time Calculation (min) (ACUTE ONLY): 18 min   Charges:   PT Evaluation $PT Eval Moderate Complexity: 1 Mod   PT  General Charges $$ ACUTE PT VISIT: 1 Visit         Merryl Hacker, PT Acute Rehabilitation Services Office: [phone]   Enedina Finner Sandralee Tarkington 09/23/2023, 8:59 AM

## 2023-09-23 NOTE — Consult Note (Signed)
Reason for Consult: AKI/CKD stage IIIb Referring Physician:  Marchelle Gearing, MD  Timothy Mata is an 89 y.o. male with a PMH significant for COPD, Atrial flutter (on Eliquis), BPH, COPD, HTN, HLD, HFrEF, and CKD stage IIIb who presented to [facility] ED on 09-22-23 c/o abdominal pain and decreased urinary frequency.  CXR with mild pulmonary edema.  CT of abd/pelvis without acute abnormality but did show mild left hydroureteronephrosis, small bilateral pleural effusions.  He was also hypotensive and started on IVF's and antibiotics.  On 9/22/he had worsening hypotension and transferred to ICU.  He had a PEA arrest with ROSC in 10 minutes.  Pt intubated and transferred to [facility] ICU.  We have been consulted due to worsening AKI/CKD stage IIIb.  The trend in Scr is seen below.   Trend in Creatinine: Creatinine, Ser  Date/Time Value Ref Range Status  09/23/2023 04:10 AM 4.63 (H) 0.61 - 1.24 mg/dL Final  44/12/270 53:66 PM 4.10 (H) 0.61 - 1.24 mg/dL Final  44/02/4741 59:56 PM 3.16 (H) 0.61 - 1.24 mg/dL Final  38/75/6433 29:51 AM 3.15 (H) 0.61 - 1.24 mg/dL Final  88/41/6606 30:16 PM 2.30 (H) 0.61 - 1.24 mg/dL Final  01/07/3234 57:32 PM 1.94 (H) 0.61 - 1.24 mg/dL Final  20/25/4270 62:37 AM 1.95 (H) 0.61 - 1.24 mg/dL Final  62/83/1517 61:60 PM 2.26 (H) 0.61 - 1.24 mg/dL Final  73/71/0626 94:85 AM 1.92 (H) 0.61 - 1.24 mg/dL Final  46/27/0350 09:38 AM 2.39 (H) 0.61 - 1.24 mg/dL Final  18/29/9371 69:67 AM 1.76 (H) 0.61 - 1.24 mg/dL Final  89/38/1017 51:02 AM 1.90 (H) 0.61 - 1.24 mg/dL Final  58/52/7782 42:35 AM 2.00 (H) 0.61 - 1.24 mg/dL Final  36/14/4315 40:08 PM 1.95 (H) 0.61 - 1.24 mg/dL Final  67/61/9509 32:67 AM 1.30 (H) 0.61 - 1.24 mg/dL Final  12/45/8099 83:38 AM 1.18 0.61 - 1.24 mg/dL Final  25/04/3975 73:41 AM 1.34 (H) 0.61 - 1.24 mg/dL Final  93/79/0240 97:35 PM 1.49 (H) 0.61 - 1.24 mg/dL Final  32/99/2426 83:41 PM 1.58 (H) 0.61 - 1.24 mg/dL Final  96/22/2979 89:21 AM 1.73 (H) 0.61 - 1.24 mg/dL Final   19/41/7408 14:48 AM 1.92 (H) 0.61 - 1.24 mg/dL Final  18/56/3149 70:26 AM 1.94 (H) 0.61 - 1.24 mg/dL Final  37/85/8850 27:74 PM 1.86 (H) 0.61 - 1.24 mg/dL Final  12/87/8676 72:09 AM 1.57 (H) 0.61 - 1.24 mg/dL Final    PMH:   Past Medical History:  Diagnosis Date   Atrial flutter (HCC)    a. s/p DCCV in 03/2022 b. repeat EKG at follow-up from DCCV showing atrial fibrillation   BPH (benign prostatic hyperplasia)    COPD (chronic obstructive pulmonary disease) (HCC)    Dysrhythmia    Gout    "left big toe"   History of kidney stones    HOH (hard of hearing)    Hypercholesterolemia    Hypertension    Preretinal fibrosis, left eye    Spinal headache 12/30/1972    PSH:   Past Surgical History:  Procedure Laterality Date   25 GAUGE PARS PLANA VITRECTOMY WITH 20 GAUGE MVR PORT Left 07/18/2015   Procedure: 25 GAUGE PARS PLANA VITRECTOMY WITH 20 GAUGE MVR PORT;  Surgeon: Sherrie George, MD;  Location: [facility] OR;  Service: Ophthalmology;  Laterality: Left;   AIR/FLUID EXCHANGE Left 07/18/2015   Procedure: AIR/FLUID EXCHANGE;  Surgeon: Sherrie George, MD;  Location: [facility] OR;  Service: Ophthalmology;  Laterality: Left;   BACK SURGERY  CARDIOVERSION N/A 04/26/2022   Procedure: CARDIOVERSION;  Surgeon: Jonelle Sidle, MD;  Location: [facility] ORS;  Service: Cardiovascular;  Laterality: N/A;   ENDARTERECTOMY Right 11/25/2022   Procedure: ENDARTERECTOMY CAROTID WITH PATCH ANGIOPLASTY;  Surgeon: Cephus Shelling, MD;  Location: [facility] OR;  Service: Vascular;  Laterality: Right;   EYE SURGERY     FOOT FRACTURE SURGERY Right 1981   LASER PHOTO ABLATION Left 07/18/2015   Procedure: LASER PHOTO ABLATION;  Surgeon: Sherrie George, MD;  Location: [facility] OR;  Service: Ophthalmology;  Laterality: Left;  head scope    LUMBAR DISC SURGERY  1974   MEMBRANE PEEL Left 07/18/2015   Procedure: MEMBRANE PEEL;  Surgeon: Sherrie George, MD;  Location: [facility] OR;  Service: Ophthalmology;  Laterality: Left;   MULTIPLE  TOOTH EXTRACTIONS  1990's   PARS PLANA VITRECTOMY Left 07/18/2015   PATCH ANGIOPLASTY Right 11/25/2022   Procedure: PATCH ANGIOPLASTY WITH 1 cm X 6 cm XENOSURE BIOLOGIC PATCH;  Surgeon: Cephus Shelling, MD;  Location: [facility] OR;  Service: Vascular;  Laterality: Right;    Allergies: No Known Allergies  Medications:   Prior to Admission medications   Medication Sig Start Date End Date Taking? Authorizing Provider  amiodarone (PACERONE) 200 MG tablet Take 200 mg Daily and none on Sunday 03/11/23  Yes Marinus Maw, MD  apixaban (ELIQUIS) 2.5 MG TABS tablet TAKE 1 TABLET BY MOUTH TWICE A DAY 05/21/23  Yes Pricilla Riffle, MD  aspirin 81 MG chewable tablet Chew 1 tablet (81 mg total) by mouth daily. 11/26/22  Yes Rhyne, Ames Coupe, PA-C  atorvastatin (LIPITOR) 80 MG tablet Take 1 tablet (80 mg total) by mouth daily. 03/27/23 03/21/24 Yes Pricilla Riffle, MD  Fluticasone-Salmeterol (ADVAIR) 250-50 MCG/DOSE AEPB Inhale 1 puff into the lungs 2 (two) times daily.   Yes [provider]  furosemide (LASIX) 40 MG tablet Take 1 tablet (40 mg total) by mouth daily. 07/07/23 10/05/23 Yes Pricilla Riffle, MD  levothyroxine (SYNTHROID) 75 MCG tablet Take 75 mcg by mouth daily before breakfast. 08/04/23 10/26/24 Yes [provider]  metoprolol succinate (TOPROL-XL) 100 MG 24 hr tablet TAKE 1 TABLET (100 MG TOTAL) BY MOUTH IN THE MORNING. TAKE WITH OR IMMEDIATELY FOLLOWING A MEAL. 06/12/23 06/06/24 Yes Pricilla Riffle, MD  nitroGLYCERIN (NITROSTAT) 0.4 MG SL tablet Place 1 tablet (0.4 mg total) under the tongue every 5 (five) minutes x 3 doses as needed for chest pain. 03/27/22  Yes [place], Alvira Philips, DO  tamsulosin (FLOMAX) 0.4 MG CAPS capsule Take 1 capsule (0.4 mg total) by mouth daily after supper. Patient not taking: Reported on 18-Oct-2023 03/27/22   [place], Eric J, DO    Inpatient medications:  acyclovir cream   Topical Q3H   amiodarone  200 mg Per Tube Daily   apixaban  2.5 mg Oral BID   aspirin  81 mg  Oral Daily   atorvastatin  80 mg Oral q1800   Chlorhexidine Gluconate Cloth  6 each Topical Daily   fluticasone furoate-vilanterol  1 puff Inhalation Daily   levothyroxine  75 mcg Oral Q0600   mouth rinse  15 mL Mouth Rinse 4 times per day   mouth rinse  15 mL Mouth Rinse 4 times per day   polyethylene glycol  17 g Oral Daily   tamsulosin  0.4 mg Oral QPC supper    Discontinued Meds:   Medications Discontinued During This Encounter  Medication Reason   allopurinol (ZYLOPRIM) 300 MG tablet Patient  Preference   allopurinol (ZYLOPRIM) tablet 300 mg    0.9 %  sodium chloride infusion    cefTRIAXone (ROCEPHIN) 1 g in sodium chloride 0.9 % 100 mL IVPB    furosemide (LASIX) tablet 40 mg    metoprolol succinate (TOPROL-XL) 24 hr tablet 100 mg    cefTRIAXone (ROCEPHIN) 2 g in sodium chloride 0.9 % 100 mL IVPB    aspirin chewable tablet 81 mg    atorvastatin (LIPITOR) tablet 80 mg    levothyroxine (SYNTHROID) tablet 75 mcg    apixaban (ELIQUIS) tablet 2.5 mg    norepinephrine (LEVOPHED) 4mg  in (0.016 mg/mL) premix infusion    dexmedetomidine (PRECEDEX) 400 MCG/100ML (4 mcg/mL) infusion    heparin ADULT infusion 100 units/mL (25000 units/254mL)    docusate (COLACE) 50 MG/5ML liquid 100 mg Duplicate   polyethylene glycol (MIRALAX / GLYCOLAX) packet 17 g Duplicate   midazolam (VERSED) injection 1-2 mg Duplicate   sodium chloride 0.9 % bolus 1,000 mL    amiodarone (PACERONE) tablet 200 mg    insulin aspart (novoLOG) injection 0-9 Units    budesonide (PULMICORT) nebulizer solution 0.5 mg    arformoterol (BROVANA) nebulizer solution 15 mcg    norepinephrine (LEVOPHED) 4mg  in (0.016 mg/mL) premix infusion    fentaNYL (SUBLIMAZE) injection 25 mcg    fentaNYL (SUBLIMAZE) injection 25-100 mcg    fentaNYL (SUBLIMAZE) injection 25 mcg    fentaNYL in NS (62mcg/ml) infusion-PREMIX    fentaNYL (SUBLIMAZE) bolus via infusion 25-100 mcg    Oral care mouth rinse     polyethylene glycol (MIRALAX / GLYCOLAX) packet 17 g    sodium chloride flush (NS) 0.9 % injection 10-40 mL    vancomycin (VANCOREADY) IVPB 1500 mg/300 mL    piperacillin-tazobactam (ZOSYN) IVPB 3.375 g    docusate (COLACE) 50 MG/5ML liquid 100 mg    midazolam (VERSED) injection 1-2 mg    docusate (COLACE) 50 MG/5ML liquid 100 mg Change in therapy   famotidine (PEPCID) tablet 20 mg Change in therapy   arformoterol (BROVANA) nebulizer solution 15 mcg Change in therapy   budesonide (PULMICORT) nebulizer solution 0.5 mg Change in therapy   polyethylene glycol (MIRALAX / GLYCOLAX) packet 17 g    acetaminophen (TYLENOL) tablet 650 mg    aspirin chewable tablet 81 mg    atorvastatin (LIPITOR) tablet 80 mg    levothyroxine (SYNTHROID) tablet 75 mcg    0.9 %  sodium chloride infusion    Oral care mouth rinse     Social History:  reports that he has quit smoking. His smoking use included cigarettes. He has a 32 pack-year smoking history. He has never used smokeless tobacco. He reports that he does not drink alcohol and does not use drugs.  Family History:   Family History  Problem Relation Age of Onset   Cancer Father     Pertinent items are noted in HPI. Weight change: 5.3 kg  Intake/Output Summary (Last 24 hours) at 09/23/2023 0928 Last data filed at 09/23/2023 0600 Gross per 24 hour  Intake 1438 ml  Output 75 ml  Net 1363 ml   BP (!) 127/59   Pulse 68   Temp 97.9 F (36.6 C) (Oral)   Resp 20   Ht 5\' 10"  (1.778 m)   Wt 116.9 kg   SpO2 95%   BMI 36.98 kg/m  Vitals:   09/23/23 0800 09/23/23 0803 09/23/23 0852 09/23/23 0900  BP: (!) 143/54   (!) 127/59  Pulse: 68  71 68  Resp: (!) 26   20  Temp:  97.9 F (36.6 C)    TempSrc:  Oral    SpO2: 95%  95% 95%  Weight:      Height:         General appearance: alert, cooperative, and no distress Head: Normocephalic, without obvious abnormality, atraumatic Resp: clear to auscultation bilaterally Cardio: regular rate and  rhythm, S1, S2 normal, no murmur, click, rub or gallop GI: soft, non-tender; bowel sounds normal; no masses,  no organomegaly Extremities: edema trace pretibial edema bilaterally  Labs: Basic Metabolic Panel: Recent Labs  Lab 09/27/2023 1319 09/21/23 0449 09/21/23 1234 09/21/23 1640 09/21/23 1952 09/22/23 0517 09/22/23 1412 09/22/23 2021 09/23/23 0410  NA 137 137 136 135 134* 135 140 136 133*  K 3.5 3.5 3.4* 3.9 4.4 4.6 3.9 4.2 4.2  CL 101 103 103 102  --  104 111 106 100  CO2 26 23 22  20*  --  22 18* 18* 18*  GLUCOSE 131* 100* 135* 186*  --  109* 78 128* 101*  BUN 39* 35* 33* 36*  --  42* 41* 52* 60*  CREATININE 2.26* 1.95* 1.94* 2.30*  --  3.15* 3.16* 4.10* 4.63*  ALBUMIN 2.7*  --   --  2.1*  --  1.6*  --   --   --   CALCIUM 8.1* 7.6* 7.8* 7.1*  --  7.2* 6.2* 7.3* 7.3*  PHOS  --   --   --   --   --   --   --   --  5.5*   Liver Function Tests: Recent Labs  Lab 09/21/2023 1319 09/21/23 1640 09/22/23 0517  AST 19 102* 108*  ALT 16 75* 88*  ALKPHOS 105 96 95  BILITOT 1.4* 1.8* 1.3*  PROT 6.1* 5.1* 4.5*  ALBUMIN 2.7* 2.1* 1.6*   Recent Labs  Lab 09/03/2023 1319  LIPASE 27   No results for input(s): "AMMONIA" in the last 168 hours. CBC: Recent Labs  Lab 09/21/23 1234 09/21/23 1639 09/21/23 1640 09/21/23 1952 09/22/23 0517 09/23/23 0410  WBC 32.2* 37.6*  --   --  39.6* 39.6*  HGB 12.3* 10.9*  --  11.6* 10.2* 9.7*  HCT 38.1* 34.7*  --  34.0* 32.8* 31.1*  MCV 92.0 94.3  --   --  93.2 92.8  PLT 248 250 247  --  240 285   PT/INR: @LABRCNTIP (inr:5) Cardiac Enzymes: )No results for input(s): "CKTOTAL", "CKMB", "CKMBINDEX", "TROPONINI" in the last 168 hours. CBG: Recent Labs  Lab 09/22/23 1521 09/22/23 1933 09/22/23 2329 09/23/23 0345 09/23/23 0800  GLUCAP 75 75 80 85 92    Iron Studies: No results for input(s): "IRON", "TIBC", "TRANSFERRIN", "FERRITIN" in the last 168 hours.  Xrays/Other Studies: US RENAL  Result Date: 09/22/2023 CLINICAL DATA:  Low  urine output EXAM: RENAL / URINARY TRACT ULTRASOUND COMPLETE COMPARISON:  None available FINDINGS: Right Kidney: Renal measurements: 10.4 x 6.8 x 6.9 cm = volume: 252 mL. Mild diffuse cortical thinning. No hydronephrosis. Left Kidney: Renal measurements: 11.1 x 6.5 x 4.8 cm = volume: 180 mL. Mild diffuse cortical thinning. No hydronephrosis. Bladder: Collapsed around Foley catheter balloon.  Unable to evaluate. Other: None. IMPRESSION: Mild diffuse bilateral renal cortical thinning consistent with chronic medical renal disease. Electronically Signed   By: Acquanetta Belling M.D.   On: 09/22/2023 16:58   ECHOCARDIOGRAM COMPLETE  Result Date: 09/22/2023    ECHOCARDIOGRAM REPORT   Patient Name:   Timothy Mata Date  of Exam: 09/22/2023 Medical Rec #:  [id number]   Height:       70.0 in Accession #:    [id number]  Weight:       246.9 lb Date of Birth:  15-Feb-1933  BSA:          2.283 m Patient Age:    89 years    BP:           119/65 mmHg Patient Gender: M           HR:           63 bpm. Exam Location:  Inpatient Procedure: 2D Echo, Color Doppler and Cardiac Doppler Indications:    Cardiac Arrest i46.9  History:        Patient has prior history of Echocardiogram examinations, most                 recent 10/14/2022. CAD, COPD; Risk Factors:Hypertension and                 Dyslipidemia.  Sonographer:    Irving Burton Senior RDCS Referring Phys: [phone] [facility] Dowdy [facility]  Sonographer Comments: Suboptimal apical window due to patient body habitus, scanned upright on artificial respirator. IMPRESSIONS  1. Left ventricular ejection fraction, by estimation, is 60 to 65%. Left ventricular ejection fraction by PLAX is 60 %. The left ventricle has normal function. The left ventricle has no regional wall motion abnormalities. There is mild concentric left ventricular hypertrophy. Left ventricular diastolic parameters are indeterminate.  2. Right ventricular systolic function is low normal. The right ventricular size is normal. There is normal  pulmonary artery systolic pressure. The estimated right ventricular systolic pressure is 32.0 mmHg.  3. Right atrial size was moderately dilated.  4. A small pericardial effusion is present. The pericardial effusion is circumferential. There is no evidence of cardiac tamponade.  5. The mitral valve is degenerative. Trivial mitral valve regurgitation. Moderate mitral annular calcification.  6. The aortic valve has an indeterminant number of cusps. There is mild calcification of the aortic valve. There is mild thickening of the aortic valve. Aortic valve regurgitation is not visualized. Aortic valve sclerosis/calcification is present, without any evidence of aortic stenosis.  7. The inferior vena cava is dilated in size with <50% respiratory variability, suggesting right atrial pressure of 15 mmHg. Comparison(s): No significant change from prior study. Conclusion(s)/Recommendation(s): Otherwise normal echocardiogram, with minor abnormalities described in the report. FINDINGS  Left Ventricle: Left ventricular ejection fraction, by estimation, is 60 to 65%. Left ventricular ejection fraction by PLAX is 60 %. The left ventricle has normal function. The left ventricle has no regional wall motion abnormalities. The left ventricular internal cavity size was normal in size. There is mild concentric left ventricular hypertrophy. Left ventricular diastolic parameters are indeterminate. Right Ventricle: The right ventricular size is normal. No increase in right ventricular wall thickness. Right ventricular systolic function is low normal. There is normal pulmonary artery systolic pressure. The tricuspid regurgitant velocity is 2.06 m/s,  and with an assumed right atrial pressure of 15 mmHg, the estimated right ventricular systolic pressure is 32.0 mmHg. Left Atrium: Left atrial size was normal in size. Right Atrium: Right atrial size was moderately dilated. Pericardium: A small pericardial effusion is present. The pericardial  effusion is circumferential. There is no evidence of cardiac tamponade. Mitral Valve: The mitral valve is degenerative in appearance. Moderate mitral annular calcification. Trivial mitral valve regurgitation. Tricuspid Valve: The tricuspid valve is normal in structure. Tricuspid valve regurgitation is  trivial. Aortic Valve: The aortic valve has an indeterminant number of cusps. There is mild calcification of the aortic valve. There is mild thickening of the aortic valve. Aortic valve regurgitation is not visualized. Aortic valve sclerosis/calcification is present, without any evidence of aortic stenosis. Pulmonic Valve: The pulmonic valve was not well visualized. Pulmonic valve regurgitation is trivial. Aorta: The aortic root, ascending aorta, aortic arch and descending aorta are all structurally normal, with no evidence of dilitation or obstruction. Venous: The inferior vena cava is dilated in size with less than 50% respiratory variability, suggesting right atrial pressure of 15 mmHg. IAS/Shunts: The atrial septum is grossly normal.  LEFT VENTRICLE PLAX 2D LV EF:         Left            Diastology                ventricular     LV e' medial:    5.77 cm/s                ejection        LV E/e' medial:  15.5                fraction by     LV e' lateral:   5.98 cm/s                PLAX is 60      LV E/e' lateral: 15.0                %. LVIDd:         4.10 cm LVIDs:         2.80 cm LV PW:         1.30 cm LV IVS:        1.10 cm LVOT diam:     1.90 cm LV SV:         39 LV SV Index:   17 LVOT Area:     2.84 cm  RIGHT VENTRICLE RV S prime:     6.53 cm/s LEFT ATRIUM             Index        RIGHT ATRIUM           Index LA diam:        3.50 cm 1.53 cm/m   RA Area:     28.10 cm LA Vol (A2C):   54.2 ml 23.74 ml/m  RA Volume:   102.00 ml 44.68 ml/m LA Vol (A4C):   72.3 ml 31.67 ml/m LA Biplane Vol: 67.4 ml 29.52 ml/m  AORTIC VALVE LVOT Vmax:   65.05 cm/s LVOT Vmean:  45.950 cm/s LVOT VTI:    0.136 m  AORTA Ao Root diam:  3.10 cm Ao Asc diam:  2.60 cm MITRAL VALVE               TRICUSPID VALVE MV Area (PHT): 3.53 cm    TR Peak grad:   17.0 mmHg MV Decel Time: 215 msec    TR Vmax:        206.00 cm/s MV E velocity: 89.60 cm/s MV A velocity: 62.00 cm/s  SHUNTS MV E/A ratio:  1.45        Systemic VTI:  0.14 m                            Systemic Diam: 1.90 cm Jodelle Red MD Electronically signed by  Jodelle Red MD Signature Date/Time: 09/22/2023/11:39:54 AM    Final    DG Chest Port 1 View  Result Date: 09/21/2023 CLINICAL DATA:  Respiratory failure EXAM: PORTABLE CHEST 1 VIEW COMPARISON:  2:22 p.m. FINDINGS: Endotracheal tube seen 4.6 cm above the carina. Nasogastric tube extends into the upper abdomen beyond the margin of the examination. Right upper extremity PICC line has been placed with its tip within the superior cavoatrial junction. Small right and small to moderate left pleural effusions are present, enlarged on the left. No pneumothorax. Stable cardiomegaly. Pulmonary vascularity is normal. Multiple right rib ORIF again noted. No acute bone abnormality. IMPRESSION: 1. Support apparatus in appropriate position. 2. Small right and small to moderate left pleural effusions, enlarged on the left. Electronically Signed   By: Helyn Numbers M.D.   On: 09/21/2023 20:16   DG Chest Port 1 View  Result Date: 09/21/2023 CLINICAL DATA:  Post intubation EXAM: PORTABLE CHEST 1 VIEW COMPARISON:  09/21/2023, 09/13/2023, 07/04/2022 FINDINGS: Endotracheal tube tip is about 4.7 cm superior to the carina. Esophageal tube tip below the diaphragm and projects over the proximal stomach. Side port hole difficult to visualize. Cardiomegaly with vascular congestion. Suspected left pleural effusion. Probable right pleural scarring. Postsurgical changes of right lower ribs. IMPRESSION: 1. Endotracheal tube tip about 4.7 cm superior to the carina. 2. Esophageal tube tip below the diaphragm and projects over the proximal stomach. 3.  Cardiomegaly with vascular congestion and suspected left pleural effusion. Electronically Signed   By: Jasmine Pang M.D.   On: 09/21/2023 16:14   CT HEAD WO CONTRAST ( )  Result Date: 09/21/2023 CLINICAL DATA:  Neuro deficit, acute, stroke suspected. Patient was unresponsive. EXAM: CT HEAD WITHOUT CONTRAST TECHNIQUE: Contiguous axial images were obtained from the base of the skull through the vertex without intravenous contrast. RADIATION DOSE REDUCTION: This exam was performed according to the departmental dose-optimization program which includes automated exposure control, adjustment of the mA and/or kV according to patient size and/or use of iterative reconstruction technique. COMPARISON:  CT head without contrast FINDINGS: Brain: Mild atrophy and white matter changes are present. No acute infarct, hemorrhage, or mass lesion is present. The ventricles are of normal size. No significant extraaxial fluid collection is present. The brainstem and cerebellum are within normal limits. Midline structures are within normal limits. Vascular: Atherosclerotic calcifications are present within the cavernous internal carotid arteries bilaterally. No hyperdense vessel is present. Skull: Calvarium is intact. No focal lytic or blastic lesions are present. No significant extracranial soft tissue lesion is present. Sinuses/Orbits: Bilateral lens replacements are noted. Globes and orbits are otherwise unremarkable. Chronic bilateral maxillary sinus disease is present with chronic wall thickening. Minimal mucosal thickening is present bilaterally. No fluid levels are present. Mild mucosal thickening is present at the left sphenoid sinus. The paranasal sinuses are otherwise clear. The mastoid air cells are clear. IMPRESSION: 1. No acute intracranial abnormality. 2. Mild atrophy and white matter disease likely reflects the sequela of chronic microvascular ischemia. 3. Chronic bilateral maxillary sinus disease. Electronically  Signed   By: Marin Roberts M.D.   On: 09/21/2023 15:00   [place] EKG SITE RITE  Result Date: 09/21/2023 If [facility] image not attached, placement could not be confirmed due to current cardiac rhythm.  DG CHEST PORT 1 VIEW  Result Date: 09/21/2023 CLINICAL DATA:  [id number] Acute hypoxemic respiratory failure (HCC) [id number] EXAM: PORTABLE CHEST 1 VIEW COMPARISON:  September 20, 2023 FINDINGS: The cardiomediastinal silhouette is unchanged in contour. Favored RIGHT-sided pleural  thickening. No pneumothorax. Central vascular congestion with mild diffuse interstitial prominence likely reflecting a degree of mild underlying pulmonary edema. Severe atherosclerotic calcifications. Status post ORIF of RIGHT-sided ribs. IMPRESSION: Similar appearance of mild pulmonary edema. Electronically Signed   By: Meda Klinefelter M.D.   On: 09/21/2023 13:35     Assessment/Plan:  AKI/CKD Stage IIIb, oliguric - presumably ischemic ATN in setting of PEA arrest.  Blood pressures have improved.  No indication for dialysis at this time.  Hopefully will start to see and increase in UOP soon.   Avoid nephrotoxic medications including NSAIDs and iodinated intravenous contrast exposure unless the latter is absolutely indicated.   Preferred narcotic agents for pain control are hydromorphone, fentanyl, and methadone. Morphine should not be used.  Avoid Baclofen and avoid oral sodium phosphate and magnesium citrate based laxatives / bowel preps.  Continue strict Input and Output monitoring. Will monitor the patient closely with you and intervene or adjust therapy as indicated by changes in clinical status/labs  PEA arrest and septic shock - complaining of chest pain, likely from CPR.  Off pressors and bp improved.  Abx per PCCM Acute respiratory failure post arrest with underlying COPD - weaned to room air.  Per PCCM Afib/flutter - on eliquis and amiodarone. Normocytic anemia - likely combination of AKI/CKD and anemia of  critical illness.  Transfuse for Hgb <7. HFrEF - EF 60% and RV function low normal.  Hold diuretics for now and follow.    Julien Nordmann Nain Rudd 09/23/2023, 9:28 AM

## 2023-09-23 NOTE — Progress Notes (Signed)
Patient transferred to 2W26 on monitor via wheelchair. Report called into Destiny, RN prior to transfer. Patient's wife took personal belongings to next room. No other needs.

## 2023-09-23 NOTE — TOC Progression Note (Signed)
Transition of Care [facility]) - Progression Note    Patient Details  Name: Timothy Mata MRN: [id number] Date of Birth: May 14, 1933  Transition [facility] CM/SW Contact  Regino-Johnson, Hershal Coria, RN Phone Number: 09/23/2023, 1:45 PM  Clinical Narrative:     CM spoke with patient and wife, Timothy Mata at bedside about Home Health recommendation. Patient states he was active with [facility] before and would like to use their services again. CM sent in referral to [facility] with acceptance noted, info on AVS.   CM will continue to follow.           Expected Discharge Plan and Services                                               Social Determinants of Health (SDOH) Interventions SDOH Screenings   Food Insecurity: Patient Unable To Answer (09/21/2023)  Housing: Patient Unable To Answer (09/21/2023)  Transportation Needs: Patient Unable To Answer (09/21/2023)  Utilities: Patient Unable To Answer (09/21/2023)  Tobacco Use: Medium Risk (09/20/2023)    Readmission Risk Interventions     No data to display

## 2023-09-23 NOTE — Progress Notes (Addendum)
NAME:  Timothy Mata, MRN:  [id number], DOB:  1933-08-13, LOS: 3 ADMISSION DATE:  2023-09-22, CONSULTATION DATE:  9/22 REFERRING MD:  Dr. Sherryll Burger, CHIEF COMPLAINT:  cardiac arrest   History of Present Illness:  Patient is a 89 yo M w/ pertinent PMH a flutter on Eliquis, COPD, HFrEF, HTN, HLD presents to [facility] ED on 22-Sep-2023 with abdominal pain.  Patient having 1 week of abdominal pain that progressively worsened.  Came to [facility] ED on Sep 22, 2023.  Denies any fevers, N/V/D.  Has had increased urinary frequency.  CXR with mild pulm edema.  CT ABD/pelvis no acute abnormality; mild left hydroureteronephrosis; small bilateral pleural effusions.  WBC/LA elevated. UA with nitrite and large leukocytes. Cultures obtained and given IV fluids.  Patient started on Rocephin.    On 9/22 patient with increased hypotension.  Patient given IV fluids and transferred to ICU.  Patient had PEA arrest with ROSC in 10 minutes.  Patient intubated post CPR.  CT head with no acute abnormality.  CXR with pulmonary edema.  Transferring to [facility] ED.  PCCM consulted.  Pertinent  Medical History   Past Medical History:  Diagnosis Date   Atrial flutter (HCC)    a. s/p DCCV in 03/2022 b. repeat EKG at follow-up from DCCV showing atrial fibrillation   BPH (benign prostatic hyperplasia)    COPD (chronic obstructive pulmonary disease) (HCC)    Dysrhythmia    Gout    "left big toe"   History of kidney stones    HOH (hard of hearing)    Hypercholesterolemia    Hypertension    Preretinal fibrosis, left eye    Spinal headache 12/30/1972     Significant Hospital Events: Including procedures, antibiotic start and stop dates in addition to other pertinent events   09-22-2023 admitted to aph w/ sepsis and uti 9/22 pea arrest; transfer to [facility] 9/23 Extubated to BiPAP and weaned to 2L [place]   Interim History / Subjective:  Awake, alert. Breathing comfortably on 2L [place].   Objective   Blood pressure (!) 143/74, pulse 71, temperature 98.6 F (37 C),  temperature source Oral, resp. rate 18, height 5\' 10"  (1.778 m), weight 116.9 kg, SpO2 99%. CVP:  [21 mmHg] 21 mmHg  Vent Mode: PSV;BIPAP FiO2 (%):  [40 %] 40 % PEEP:  [5 cmH20] 5 cmH20 Pressure Support:  [8 cmH20] 8 cmH20   Intake/Output Summary (Last 24 hours) at 09/23/2023 5732 Last data filed at 09/23/2023 0600 Gross per 24 hour  Intake 1698.82 ml  Output 95 ml  Net 1603.82 ml   Filed Weights   09/21/23 1321 09/22/23 0425 09/23/23 0500  Weight: 111.6 kg 112 kg 116.9 kg    Examination: General:   NAD; ill appearing  HEENT: MM pink/moist; ETT in place Neuro: Aox3; follows commands CV: s1s2, irregular rate 60s, no m/r/g PULM:  dim clear BS bilaterally, no increased WOB GI: soft, bsx4 active  Extremities: cool/dry, ble edema  Skin: no rashes or lesions    Resolved Hospital Problem list     Assessment & Plan:   PEA arrest Septic shock UTI Endorses CP most likely 2/2 to chest compressions. BP has been stable. Urine cultures pending. Continue ABX until urine culture results.  -K repleted; trend CMP and Mag and replete electrolytes as needed -echo completed and WNL -f/u bcx2, urine culture/UA, and trach culture -Continue CTX -trend wbc/fever curve  Acute respiratory failure post arrest Pulmonary edema and small b/l pleural effusion Hx COPD Improving. Weaned to RA this  morning and worked with PT, able to walk around the unit. He is coughing up mucus and using his Incentive Spirometer.  -brovana/pulmicort; duoneb prn -s/p Lasix, monitor urine output   Afib/flutter on eliquis and amio Stable. Rate controlled.  -transitioned to Eliquis 2.5 mg BID yesterday  -tele monitoring -hold home amio while rate 60s -trend electrolytes and replete as needed  Hypokalemia Elevated creat on ckd 3a Worsening Cr with very little urine output.  -Renal U/S - diffuse cortical thickening consistent with chronic medical renal disease  -95 cc urine output yesterday  -Trend BMP /  urinary output -Replace electrolytes as indicated -Avoid nephrotoxic agents, ensure adequate renal perfusion  Anemia Plan: -trend cbc  Mild Hyperglycemia Plan: -cbg monitoring -hold on ssi for now -A1c 5.8 -DC SSI  Htn Hld HFrEF Plan: -hold home anti-hypertensives -resume statin and asa -lasix x1 -daily weights; strict I/o's   Best Practice (right click and "Reselect all SmartList Selections" daily)   Diet/type: NPO w/ meds via tube DVT prophylaxis: systemic heparin GI prophylaxis: H2B Lines: N/A; PICC line Foley:  Yes, and it is still needed Code Status:  full code Last date of multidisciplinary goals of care discussion [9/22 updated wife over phone]     LABS    PULMONARY Recent Labs  Lab 09/21/23 1952  PHART 7.373  PCO2ART 34.6  PO2ART 215*  HCO3 20.1  TCO2 21*  O2SAT 100    CBC Recent Labs  Lab 09/21/23 1639 09/21/23 1640 09/21/23 1952 09/22/23 0517 09/23/23 0410  HGB 10.9*  --  11.6* 10.2* 9.7*  HCT 34.7*  --  34.0* 32.8* 31.1*  WBC 37.6*  --   --  39.6* 39.6*  PLT 250 247  --  240 285    COAGULATION Recent Labs  Lab 09/21/23 1640  INR 1.7*    CARDIAC  No results for input(s): "TROPONINI" in the last 168 hours. No results for input(s): "PROBNP" in the last 168 hours.   CHEMISTRY Recent Labs  Lab 09/21/23 1234 09/21/23 1640 09/21/23 1952 09/21/23 2246 09/22/23 0517 09/22/23 1412 09/22/23 2021 09/23/23 0410  NA 136 135 134*  --  135 140 136  --   K 3.4* 3.9 4.4  --  4.6 3.9 4.2  --   CL 103 102  --   --  104 111 106  --   CO2 22 20*  --   --  22 18* 18*  --   GLUCOSE 135* 186*  --   --  109* 78 128*  --   BUN 33* 36*  --   --  42* 41* 52*  --   CREATININE 1.94* 2.30*  --   --  3.15* 3.16* 4.10*  --   CALCIUM 7.8* 7.1*  --   --  7.2* 6.2* 7.3*  --   MG 2.2  --   --  2.1 2.5*  --   --  2.5*  PHOS  --   --   --   --   --   --   --  5.5*   Estimated Creatinine Clearance: 16.3 mL/min (A) (by C-G formula based on SCr of  4.1 mg/dL (H)).   LIVER Recent Labs  Lab 09/03/2023 1319 09/21/23 1640 09/22/23 0517  AST 19 102* 108*  ALT 16 75* 88*  ALKPHOS 105 96 95  BILITOT 1.4* 1.8* 1.3*  PROT 6.1* 5.1* 4.5*  ALBUMIN 2.7* 2.1* 1.6*  INR  --  1.7*  --  INFECTIOUS Recent Labs  Lab 09/21/23 1640 09/21/23 2246 09/22/23 0808  LATICACIDVEN 2.7* 1.4 1.6  PROCALCITON  --  7.92  --      ENDOCRINE CBG (last 3)  Recent Labs    09/22/23 1933 09/22/23 2329 09/23/23 0345  GLUCAP 75 80 85         IMAGING x48h  - image(s) personally visualized  -   highlighted in bold US RENAL  Result Date: 09/22/2023 CLINICAL DATA:  Low urine output EXAM: RENAL / URINARY TRACT ULTRASOUND COMPLETE COMPARISON:  None available FINDINGS: Right Kidney: Renal measurements: 10.4 x 6.8 x 6.9 cm = volume: 252 mL. Mild diffuse cortical thinning. No hydronephrosis. Left Kidney: Renal measurements: 11.1 x 6.5 x 4.8 cm = volume: 180 mL. Mild diffuse cortical thinning. No hydronephrosis. Bladder: Collapsed around Foley catheter balloon.  Unable to evaluate. Other: None. IMPRESSION: Mild diffuse bilateral renal cortical thinning consistent with chronic medical renal disease. Electronically Signed   By: Acquanetta Belling M.D.   On: 09/22/2023 16:58   ECHOCARDIOGRAM COMPLETE  Result Date: 09/22/2023    ECHOCARDIOGRAM REPORT   Patient Name:   Nicoles Buck Date of Exam: 09/22/2023 Medical Rec #:  [id number]   Height:       70.0 in Accession #:    [id number]  Weight:       246.9 lb Date of Birth:  1933/11/11  BSA:          2.283 m Patient Age:    89 years    BP:           119/65 mmHg Patient Gender: M           HR:           63 bpm. Exam Location:  Inpatient Procedure: 2D Echo, Color Doppler and Cardiac Doppler Indications:    Cardiac Arrest i46.9  History:        Patient has prior history of Echocardiogram examinations, most                 recent 10/14/2022. CAD, COPD; Risk Factors:Hypertension and                 Dyslipidemia.  Sonographer:     Irving Burton Senior RDCS Referring Phys: [phone] Lamont Dowdy [facility]  Sonographer Comments: Suboptimal apical window due to patient body habitus, scanned upright on artificial respirator. IMPRESSIONS  1. Left ventricular ejection fraction, by estimation, is 60 to 65%. Left ventricular ejection fraction by PLAX is 60 %. The left ventricle has normal function. The left ventricle has no regional wall motion abnormalities. There is mild concentric left ventricular hypertrophy. Left ventricular diastolic parameters are indeterminate.  2. Right ventricular systolic function is low normal. The right ventricular size is normal. There is normal pulmonary artery systolic pressure. The estimated right ventricular systolic pressure is 32.0 mmHg.  3. Right atrial size was moderately dilated.  4. A small pericardial effusion is present. The pericardial effusion is circumferential. There is no evidence of cardiac tamponade.  5. The mitral valve is degenerative. Trivial mitral valve regurgitation. Moderate mitral annular calcification.  6. The aortic valve has an indeterminant number of cusps. There is mild calcification of the aortic valve. There is mild thickening of the aortic valve. Aortic valve regurgitation is not visualized. Aortic valve sclerosis/calcification is present, without any evidence of aortic stenosis.  7. The inferior vena cava is dilated in size with <50% respiratory variability, suggesting right atrial pressure of 15 mmHg. Comparison(s): No significant change from  prior study. Conclusion(s)/Recommendation(s): Otherwise normal echocardiogram, with minor abnormalities described in the report. FINDINGS  Left Ventricle: Left ventricular ejection fraction, by estimation, is 60 to 65%. Left ventricular ejection fraction by PLAX is 60 %. The left ventricle has normal function. The left ventricle has no regional wall motion abnormalities. The left ventricular internal cavity size was normal in size. There is mild concentric left  ventricular hypertrophy. Left ventricular diastolic parameters are indeterminate. Right Ventricle: The right ventricular size is normal. No increase in right ventricular wall thickness. Right ventricular systolic function is low normal. There is normal pulmonary artery systolic pressure. The tricuspid regurgitant velocity is 2.06 m/s,  and with an assumed right atrial pressure of 15 mmHg, the estimated right ventricular systolic pressure is 32.0 mmHg. Left Atrium: Left atrial size was normal in size. Right Atrium: Right atrial size was moderately dilated. Pericardium: A small pericardial effusion is present. The pericardial effusion is circumferential. There is no evidence of cardiac tamponade. Mitral Valve: The mitral valve is degenerative in appearance. Moderate mitral annular calcification. Trivial mitral valve regurgitation. Tricuspid Valve: The tricuspid valve is normal in structure. Tricuspid valve regurgitation is trivial. Aortic Valve: The aortic valve has an indeterminant number of cusps. There is mild calcification of the aortic valve. There is mild thickening of the aortic valve. Aortic valve regurgitation is not visualized. Aortic valve sclerosis/calcification is present, without any evidence of aortic stenosis. Pulmonic Valve: The pulmonic valve was not well visualized. Pulmonic valve regurgitation is trivial. Aorta: The aortic root, ascending aorta, aortic arch and descending aorta are all structurally normal, with no evidence of dilitation or obstruction. Venous: The inferior vena cava is dilated in size with less than 50% respiratory variability, suggesting right atrial pressure of 15 mmHg. IAS/Shunts: The atrial septum is grossly normal.  LEFT VENTRICLE PLAX 2D LV EF:         Left            Diastology                ventricular     LV e' medial:    5.77 cm/s                ejection        LV E/e' medial:  15.5                fraction by     LV e' lateral:   5.98 cm/s                PLAX is 60       LV E/e' lateral: 15.0                %. LVIDd:         4.10 cm LVIDs:         2.80 cm LV PW:         1.30 cm LV IVS:        1.10 cm LVOT diam:     1.90 cm LV SV:         39 LV SV Index:   17 LVOT Area:     2.84 cm  RIGHT VENTRICLE RV S prime:     6.53 cm/s LEFT ATRIUM             Index        RIGHT ATRIUM           Index LA diam:        3.50 cm 1.53  cm/m   RA Area:     28.10 cm LA Vol (A2C):   54.2 ml 23.74 ml/m  RA Volume:   102.00 ml 44.68 ml/m LA Vol (A4C):   72.3 ml 31.67 ml/m LA Biplane Vol: 67.4 ml 29.52 ml/m  AORTIC VALVE LVOT Vmax:   65.05 cm/s LVOT Vmean:  45.950 cm/s LVOT VTI:    0.136 m  AORTA Ao Root diam: 3.10 cm Ao Asc diam:  2.60 cm MITRAL VALVE               TRICUSPID VALVE MV Area (PHT): 3.53 cm    TR Peak grad:   17.0 mmHg MV Decel Time: 215 msec    TR Vmax:        206.00 cm/s MV E velocity: 89.60 cm/s MV A velocity: 62.00 cm/s  SHUNTS MV E/A ratio:  1.45        Systemic VTI:  0.14 m                            Systemic Diam: 1.90 cm Jodelle Red MD Electronically signed by Jodelle Red MD Signature Date/Time: 09/22/2023/11:39:54 AM    Final    DG Chest Port 1 View  Result Date: 09/21/2023 CLINICAL DATA:  Respiratory failure EXAM: PORTABLE CHEST 1 VIEW COMPARISON:  2:22 p.m. FINDINGS: Endotracheal tube seen 4.6 cm above the carina. Nasogastric tube extends into the upper abdomen beyond the margin of the examination. Right upper extremity PICC line has been placed with its tip within the superior cavoatrial junction. Small right and small to moderate left pleural effusions are present, enlarged on the left. No pneumothorax. Stable cardiomegaly. Pulmonary vascularity is normal. Multiple right rib ORIF again noted. No acute bone abnormality. IMPRESSION: 1. Support apparatus in appropriate position. 2. Small right and small to moderate left pleural effusions, enlarged on the left. Electronically Signed   By: Helyn Numbers M.D.   On: 09/21/2023 20:16   DG Chest Port 1  View  Result Date: 09/21/2023 CLINICAL DATA:  Post intubation EXAM: PORTABLE CHEST 1 VIEW COMPARISON:  09/21/2023, 2023/10/16, 07/04/2022 FINDINGS: Endotracheal tube tip is about 4.7 cm superior to the carina. Esophageal tube tip below the diaphragm and projects over the proximal stomach. Side port hole difficult to visualize. Cardiomegaly with vascular congestion. Suspected left pleural effusion. Probable right pleural scarring. Postsurgical changes of right lower ribs. IMPRESSION: 1. Endotracheal tube tip about 4.7 cm superior to the carina. 2. Esophageal tube tip below the diaphragm and projects over the proximal stomach. 3. Cardiomegaly with vascular congestion and suspected left pleural effusion. Electronically Signed   By: Jasmine Pang M.D.   On: 09/21/2023 16:14   CT HEAD WO CONTRAST ( )  Result Date: 09/21/2023 CLINICAL DATA:  Neuro deficit, acute, stroke suspected. Patient was unresponsive. EXAM: CT HEAD WITHOUT CONTRAST TECHNIQUE: Contiguous axial images were obtained from the base of the skull through the vertex without intravenous contrast. RADIATION DOSE REDUCTION: This exam was performed according to the departmental dose-optimization program which includes automated exposure control, adjustment of the mA and/or kV according to patient size and/or use of iterative reconstruction technique. COMPARISON:  CT head without contrast FINDINGS: Brain: Mild atrophy and white matter changes are present. No acute infarct, hemorrhage, or mass lesion is present. The ventricles are of normal size. No significant extraaxial fluid collection is present. The brainstem and cerebellum are within normal limits. Midline structures are within normal limits. Vascular: Atherosclerotic calcifications are present  within the cavernous internal carotid arteries bilaterally. No hyperdense vessel is present. Skull: Calvarium is intact. No focal lytic or blastic lesions are present. No significant extracranial soft tissue  lesion is present. Sinuses/Orbits: Bilateral lens replacements are noted. Globes and orbits are otherwise unremarkable. Chronic bilateral maxillary sinus disease is present with chronic wall thickening. Minimal mucosal thickening is present bilaterally. No fluid levels are present. Mild mucosal thickening is present at the left sphenoid sinus. The paranasal sinuses are otherwise clear. The mastoid air cells are clear. IMPRESSION: 1. No acute intracranial abnormality. 2. Mild atrophy and white matter disease likely reflects the sequela of chronic microvascular ischemia. 3. Chronic bilateral maxillary sinus disease. Electronically Signed   By: Marin Roberts M.D.   On: 09/21/2023 15:00   [place] EKG SITE RITE  Result Date: 09/21/2023 If [facility] image not attached, placement could not be confirmed due to current cardiac rhythm.  DG CHEST PORT 1 VIEW  Result Date: 09/21/2023 CLINICAL DATA:  [id number] Acute hypoxemic respiratory failure (HCC) [id number] EXAM: PORTABLE CHEST 1 VIEW COMPARISON:  September 20, 2023 FINDINGS: The cardiomediastinal silhouette is unchanged in contour. Favored RIGHT-sided pleural thickening. No pneumothorax. Central vascular congestion with mild diffuse interstitial prominence likely reflecting a degree of mild underlying pulmonary edema. Severe atherosclerotic calcifications. Status post ORIF of RIGHT-sided ribs. IMPRESSION: Similar appearance of mild pulmonary edema. Electronically Signed   By: Meda Klinefelter M.D.   On: 09/21/2023 13:35      Critical care time: 45 minutes     Glendale Chard, DO  [facility] 09/23/2023, 7:02 AM  Please see [email or web address] for pager details.  From 7A-7P if no response, please call [phone]. After hours, please call ELink [phone].

## 2023-09-23 NOTE — Evaluation (Signed)
Occupational Therapy Evaluation Patient Details Name: Timothy Mata MRN: [id number] DOB: Dec 20, 1933 Today's Date: 09/23/2023   History of Present Illness 89 y.o. male admitted at [facility] 09/29/2023 with abdominal pain, UTI, sepsis.  9/22 cardiac arrest,  PEA 8-10 min with transfer to [facility]. Intubation 9/22-9/23. PMHx: Aflutter on Eliquis and amiodarone, BPH, COPD, HTN, gout   Clinical Impression   Timothy Mata was evaluated s/p the above admission list. He is indep and lives with his wife at baseline. Upon evaluation the pt was limited by general malaise, weakness and decreased activity tolerance. Overall he needed up to CGA for transfers and progressed to superivsion A for mobility with RW. Due to the deficits listed below the pt also needs up to CGA with cues for safety during ADLs. Pt will benefit from continued acute OT services and HHOT.        If plan is discharge home, recommend the following: A little help with walking and/or transfers;A little help with bathing/dressing/bathroom;Assistance with cooking/housework;Direct supervision/assist for medications management;Direct supervision/assist for financial management    Functional Status Assessment  Patient has had a recent decline in their functional status and demonstrates the ability to make significant improvements in function in a reasonable and predictable amount of time.        Precautions / Restrictions Precautions Precautions: Fall Restrictions Weight Bearing Restrictions: No      Mobility Bed Mobility Overal bed mobility: Needs Assistance Bed Mobility: Supine to Sit, Sit to Supine     Supine to sit: Supervision Sit to supine: Supervision        Transfers Overall transfer level: Needs assistance Equipment used: Rolling walker (2 wheels) Transfers: Sit to/from Stand Sit to Stand: Contact guard assist           General transfer comment: progessed to supervision A during ambulation with RW      Balance Overall balance  assessment: Needs assistance   Sitting balance-Leahy Scale: Good     Standing balance support: Bilateral upper extremity supported, During functional activity, Reliant on assistive device for balance Standing balance-Leahy Scale: Fair                             ADL either performed or assessed with clinical judgement   ADL Overall ADL's : Needs assistance/impaired Eating/Feeding: Independent   Grooming: Supervision/safety;Standing Grooming Details (indicate cue type and reason): at the sink Upper Body Bathing: Set up   Lower Body Bathing: Contact guard assist;Sit to/from stand   Upper Body Dressing : Set up;Sitting   Lower Body Dressing: Contact guard assist;Sit to/from stand   Toilet Transfer: Contact guard assist;Ambulation;Rolling walker (2 wheels)   Toileting- Clothing Manipulation and Hygiene: Supervision/safety;Sitting/lateral lean       Functional mobility during ADLs: Contact guard assist;Rolling walker (2 wheels) General ADL Comments: no physical assist required, minimal cues for safety. Pt benefits from RW for balance     Vision Baseline Vision/History: 1 Wears glasses Vision Assessment?: No apparent visual deficits     Perception Perception: Not tested       Praxis Praxis: Not tested       Pertinent Vitals/Pain Pain Assessment Pain Assessment: Faces Faces Pain Scale: Hurts a little bit Pain Location: chest Pain Descriptors / Indicators: Sore Pain Intervention(s): Limited activity within patient's tolerance     Extremity/Trunk Assessment Upper Extremity Assessment Upper Extremity Assessment: Overall WFL for tasks assessed   Lower Extremity Assessment Lower Extremity Assessment: Defer to PT evaluation  Cervical / Trunk Assessment Cervical / Trunk Assessment: Normal   Communication Communication Communication: No apparent difficulties   Cognition Arousal: Alert Behavior During Therapy: WFL for tasks assessed/performed Overall  Cognitive Status: Within Functional Limits for tasks assessed           General Comments  VSS on RA     Home Living Family/patient expects to be discharged to:: Private residence Living Arrangements: Spouse/significant other Available Help at Discharge: Family;Available 24 hours/day Type of Home: House Home Access: Stairs to enter [employer] of Steps: 5 Entrance Stairs-Rails: Can reach both Home Layout: Multi-level;Able to live on main level with bedroom/bathroom     Bathroom Shower/Tub: Tub/shower unit;Walk-in shower   Bathroom Toilet: Standard     Home Equipment: Agricultural consultant (2 wheels);BSC/3in1;Cane - single point;Tub bench;Shower seat          Prior Functioning/Environment Prior Level of Function : Independent/Modified Independent;Driving             Mobility Comments: indep, no AD ADLs Comments: indep, active, does yard work        OT Problem List: Decreased strength;Decreased range of motion;Decreased activity tolerance;Impaired balance (sitting and/or standing);Decreased safety awareness;Decreased knowledge of use of DME or AE;Decreased knowledge of precautions      OT Treatment/Interventions: Self-care/ADL training;Therapeutic exercise;DME and/or AE instruction;Energy conservation;Therapeutic activities;Patient/family education;Balance training    OT Goals(Current goals can be found in the care plan section) Acute Rehab OT Goals Patient Stated Goal: to feel better OT Goal Formulation: With patient Time For Goal Achievement: 10/07/23 Potential to Achieve Goals: Good ADL Goals Additional ADL Goal #1: pt will complete BADLs with mod I Additional ADL Goal #2: Pt will tolerate at least 10 minutes of OOB activity to demonstrate increased endurance for ADLs at home  OT Frequency: Min 1X/week       AM-PAC OT "6 Clicks" Daily Activity     Outcome Measure Help from another person eating meals?: None Help from another person taking care of  personal grooming?: A Little Help from another person toileting, which includes using toliet, bedpan, or urinal?: A Little Help from another person bathing (including washing, rinsing, drying)?: A Little Help from another person to put on and taking off regular upper body clothing?: A Little Help from another person to put on and taking off regular lower body clothing?: A Little 6 Click Score: 19   End of Session Equipment Utilized During Treatment: Rolling walker (2 wheels) Nurse Communication: Mobility status  Activity Tolerance: Patient tolerated treatment well Patient left: in bed;with call bell/phone within reach  OT Visit Diagnosis: Unsteadiness on feet (R26.81);Other abnormalities of gait and mobility (R26.89);Muscle weakness (generalized) (M62.81)                Time: 1610-9604 OT Time Calculation (min): 28 min Charges:  OT General Charges $OT Visit: 1 Visit OT Evaluation $OT Eval Moderate Complexity: 1 Mod OT Treatments $Self Care/Home Management : 8-22 mins  Derenda Mis, OTR/L Acute Rehabilitation Services Office [phone] Secure Chat Communication Preferred   Donia Pounds 09/23/2023, 3:47 PM

## 2023-09-24 ENCOUNTER — Inpatient Hospital Stay (HOSPITAL_COMMUNITY): Payer: Medicare HMO

## 2023-09-24 DIAGNOSIS — I469 Cardiac arrest, cause unspecified: Secondary | ICD-10-CM | POA: Diagnosis not present

## 2023-09-24 DIAGNOSIS — N179 Acute kidney failure, unspecified: Secondary | ICD-10-CM | POA: Diagnosis not present

## 2023-09-24 DIAGNOSIS — D649 Anemia, unspecified: Secondary | ICD-10-CM | POA: Insufficient documentation

## 2023-09-24 DIAGNOSIS — N1832 Chronic kidney disease, stage 3b: Secondary | ICD-10-CM

## 2023-09-24 DIAGNOSIS — A419 Sepsis, unspecified organism: Secondary | ICD-10-CM | POA: Diagnosis not present

## 2023-09-24 DIAGNOSIS — R6521 Severe sepsis with septic shock: Secondary | ICD-10-CM

## 2023-09-24 DIAGNOSIS — J9601 Acute respiratory failure with hypoxia: Secondary | ICD-10-CM

## 2023-09-24 DIAGNOSIS — N170 Acute kidney failure with tubular necrosis: Secondary | ICD-10-CM

## 2023-09-24 LAB — CBC
HCT: 30.3 % — ABNORMAL LOW (ref 39.0–52.0)
Hemoglobin: 9.6 g/dL — ABNORMAL LOW (ref 13.0–17.0)
MCH: 29.9 pg (ref 26.0–34.0)
MCHC: 31.7 g/dL (ref 30.0–36.0)
MCV: 94.4 fL (ref 80.0–100.0)
Platelets: 300 10*3/uL (ref 150–400)
RBC: 3.21 MIL/uL — ABNORMAL LOW (ref 4.22–5.81)
RDW: 15.5 % (ref 11.5–15.5)
WBC: 33 10*3/uL — ABNORMAL HIGH (ref 4.0–10.5)
nRBC: 0 % (ref 0.0–0.2)

## 2023-09-24 LAB — COMPREHENSIVE METABOLIC PANEL
ALT: 55 U/L — ABNORMAL HIGH (ref 0–44)
AST: 37 U/L (ref 15–41)
Albumin: 1.6 g/dL — ABNORMAL LOW (ref 3.5–5.0)
Alkaline Phosphatase: 93 U/L (ref 38–126)
Anion gap: 13 (ref 5–15)
BUN: 72 mg/dL — ABNORMAL HIGH (ref 8–23)
CO2: 18 mmol/L — ABNORMAL LOW (ref 22–32)
Calcium: 7.7 mg/dL — ABNORMAL LOW (ref 8.9–10.3)
Chloride: 104 mmol/L (ref 98–111)
Creatinine, Ser: 6.05 mg/dL — ABNORMAL HIGH (ref 0.61–1.24)
GFR, Estimated: 8 mL/min — ABNORMAL LOW (ref >60)
Glucose, Bld: 108 mg/dL — ABNORMAL HIGH (ref 70–99)
Potassium: 4 mmol/L (ref 3.5–5.1)
Sodium: 135 mmol/L (ref 135–145)
Total Bilirubin: 0.6 mg/dL (ref 0.3–1.2)
Total Protein: 5.4 g/dL — ABNORMAL LOW (ref 6.5–8.1)

## 2023-09-24 LAB — GLUCOSE, CAPILLARY
Glucose-Capillary: 100 mg/dL — ABNORMAL HIGH (ref 70–99)
Glucose-Capillary: 127 mg/dL — ABNORMAL HIGH (ref 70–99)
Glucose-Capillary: 98 mg/dL (ref 70–99)
Glucose-Capillary: 98 mg/dL (ref 70–99)

## 2023-09-24 LAB — MAGNESIUM: Magnesium: 2.6 mg/dL — ABNORMAL HIGH (ref 1.7–2.4)

## 2023-09-24 LAB — CALCIUM, IONIZED: Calcium, Ionized, Serum: 4.3 mg/dL — ABNORMAL LOW (ref 4.5–5.6)

## 2023-09-24 LAB — PHOSPHORUS: Phosphorus: 6.4 mg/dL — ABNORMAL HIGH (ref 2.5–4.6)

## 2023-09-24 MED ORDER — HYDRALAZINE HCL 25 MG PO TABS
25.0000 mg | ORAL_TABLET | ORAL | Status: DC | PRN
  Filled 2023-09-24: qty 1

## 2023-09-24 MED ORDER — LABETALOL HCL 5 MG/ML IV SOLN: 10.0000 mg | INTRAVENOUS | Status: DC | PRN

## 2023-09-24 NOTE — Assessment & Plan Note (Signed)
-   no s/s exac

## 2023-09-24 NOTE — Assessment & Plan Note (Signed)
-   Continue Flomax 

## 2023-09-24 NOTE — Assessment & Plan Note (Addendum)
-   Prior baseline appears to be around 11 to 12 g/dL -Some downtrend expected in setting of volume resuscitation and also worsened renal function plus presumed hemothorax

## 2023-09-24 NOTE — Assessment & Plan Note (Signed)
-   decompensated in setting of septic shock from UTI; found in PEA arrest with 10 minutes to ROSC - echo obtained 9/23: EF 60-65%, no RWMA, indeterminate diastology; small circumferential pericardial effusion

## 2023-09-24 NOTE — Care Management Important Message (Signed)
Important Message  Patient Details  Name: Timothy Mata MRN: [id number] Date of Birth: 03-27-1933   Important Message Given:  Yes - Medicare IM     Dorena Bodo 09/24/2023, 3:23 PM

## 2023-09-24 NOTE — Assessment & Plan Note (Addendum)
-   s/p right endarterectomy with vascular surgery on 11/25/22 -On aspirin and Eliquis at home (see hematuria) -Continue Lipitor

## 2023-09-24 NOTE — Assessment & Plan Note (Addendum)
-   Holding Eliquis and aspirin until hematuria resolves -Continue amiodarone

## 2023-09-24 NOTE — Plan of Care (Signed)
  Problem: Clinical Measurements: Goal: Ability to maintain clinical measurements within normal limits will improve Outcome: Not Progressing   Problem: Elimination: Goal: Will not experience complications related to urinary retention Outcome: Not Progressing Note: Patient Urine output at end of shift was 200 mL. Bladder scan volume 0 mL. Attending provider notified.    Problem: Education: Goal: Knowledge of General Education information will improve Description: Including pain rating scale, medication(s)/side effects and non-pharmacologic comfort measures Outcome: Progressing   Problem: Clinical Measurements: Goal: Respiratory complications will improve Outcome: Progressing Goal: Cardiovascular complication will be avoided Outcome: Progressing   Problem: Nutrition: Goal: Adequate nutrition will be maintained Outcome: Progressing   Problem: Elimination: Goal: Will not experience complications related to bowel motility Outcome: Progressing

## 2023-09-24 NOTE — Progress Notes (Addendum)
Progress Note    Timothy Mata   YNW:[id number]  DOB: 1933/03/22  DOA: 2023-10-13     4 PCP: Royann Shivers, PA-C  Initial CC: initially abd pain  Hospital Course: Timothy Mata is an 89 yo male with PMH aflutter on Eliquis, COPD, HFrEF, HTN, HLD who initially presented to [facility] on 10/13/23.  Has had increased urinary frequency. CXR with mild pulm edema. CT ABD/pelvis no acute abnormality; mild left hydroureteronephrosis; small bilateral pleural effusions. WBC/LA elevated. UA with nitrite and large leukocytes. Cultures obtained and given IV fluids. Patient started on Rocephin.  Due to worsening hypotension, he was transferred to the ICU.  He suffered PEA arrest with ROSC after 10 minutes.  He was then transferred to [facility]. He was able to be extubated on 09/22/2023.  He developed decreased UOP and ATN after his cardiac event.   Interval History:  No events overnight. Wife present this morning. Still having decreased UOP but feeling ok/improved. Has less chest wall pain from compressions. Is noted to have LE edema.   Assessment and Plan: * Septic shock (HCC)-resolved as of 09/24/2023 - due to UTI on admission - Ucx indeterminate; completing empiric course   Acute respiratory failure (HCC) - in setting of cardiac arrest; extubated 9/23 - on 2L; continue weaning off - repeat CXR given ongoing O2 demand and concern for volume overload  Cardiac arrest (HCC)-resolved as of 09/24/2023 - decompensated in setting of septic shock from UTI; found in PEA arrest with 10 minutes to ROSC - echo obtained 9/23: EF 60-65%, no RWMA, indeterminate diastology; small circumferential pericardial effusion  Acute renal failure superimposed on stage 3b chronic kidney disease (HCC) - patient has history of CKD3b. Baseline creat ~ 1.9 - 2, eGFR~ 33-40 - at baseline on admission but then s/p cardiac arrest likely has developed ATN from hypoperfusion - continue trending labs; currently creat  still rising and bicarb plateau at 18; no indication for HD at this time - nephrology continuing to follow - continue foley and monitor output    Normocytic anemia - Prior baseline appears to be around 11 to 12 g/dL -Some downtrend expected in setting of volume resuscitation and also worsened renal function   Asymptomatic carotid artery stenosis without infarction, right - s/p right endarterectomy with vascular surgery on 11/25/22 -On aspirin and Eliquis -Continue Lipitor  COPD (chronic obstructive pulmonary disease) (HCC) - no s/s exac  HFrEF (heart failure with reduced ejection fraction) (HCC) - EF 60-65% no RWMA; indeterminate diastology - prior hx of EF 45% in June 2023 per notes   PAF (paroxysmal atrial fibrillation) (HCC) - Continue Eliquis and aspirin -Continue amiodarone  Hyperlipidemia - Continue Lipitor  Benign prostatic hyperplasia - Continue Flomax  Essential hypertension - Avoiding hypotension but will start PRN's in case of persistently elevated blood pressure   Old records reviewed in assessment of this patient  Antimicrobials: Rocephin 10-13-2023 >> current  DVT prophylaxis:  apixaban (ELIQUIS) tablet 2.5 mg Start: 09/22/23 1500 apixaban (ELIQUIS) tablet 2.5 mg   Code Status:   Code Status: Full Code  Mobility Assessment (Last 72 Hours)     Mobility Assessment     Row Name 09/24/23 0735 09/23/23 2115 09/23/23 1400 09/23/23 1336 09/23/23 0858   Does patient have an order for bedrest or is patient medically unstable No - Continue assessment No - Continue assessment -- No - Continue assessment --   What is the highest level of mobility based on the progressive mobility assessment? Level 5 (Walks  with assist in room/hall) - Balance while stepping forward/back and can walk in room with assist - Complete Level 5 (Walks with assist in room/hall) - Balance while stepping forward/back and can walk in room with assist - Complete Level 5 (Walks with assist in  room/hall) - Balance while stepping forward/back and can walk in room with assist - Complete Level 5 (Walks with assist in room/hall) - Balance while stepping forward/back and can walk in room with assist - Complete Level 5 (Walks with assist in room/hall) - Balance while stepping forward/back and can walk in room with assist - Complete   Is the above level different from baseline mobility prior to current illness? Yes - Recommend PT order -- -- Yes - Recommend PT order --    Row Name 09/21/23 1930           Does patient have an order for bedrest or is patient medically unstable No - Continue assessment       What is the highest level of mobility based on the progressive mobility assessment? Level 1 (Bedfast) - Unable to balance while sitting on edge of bed       Is the above level different from baseline mobility prior to current illness? Yes - Recommend PT order                Barriers to discharge: none Disposition Plan:  Home Status is: Inpt  Objective: Blood pressure (!) 170/59, pulse 66, temperature 97.6 F (36.4 C), resp. rate 15, height 5\' 10"  (1.778 m), weight 37 kg, SpO2 98%.  Examination:  Physical Exam Constitutional:      General: He is not in acute distress.    Appearance: Normal appearance.  HENT:     Head: Normocephalic and atraumatic.     Mouth/Throat:     Mouth: Mucous membranes are moist.  Eyes:     Extraocular Movements: Extraocular movements intact.  Cardiovascular:     Rate and Rhythm: Normal rate and regular rhythm.  Pulmonary:     Effort: Pulmonary effort is normal. No respiratory distress.     Breath sounds: Normal breath sounds. No wheezing.  Abdominal:     General: Bowel sounds are normal. There is no distension.     Palpations: Abdomen is soft.     Tenderness: There is no abdominal tenderness.  Musculoskeletal:        General: Normal range of motion.     Cervical back: Normal range of motion and neck supple.     Right lower leg: Edema (2+  pitting edema) present.     Left lower leg: Edema (2+ pitting edema) present.  Skin:    General: Skin is warm and dry.  Neurological:     General: No focal deficit present.     Mental Status: He is alert.  Psychiatric:        Mood and Affect: Mood normal.        Behavior: Behavior normal.      Consultants:  Nephrology   Procedures:    Data Reviewed: Results for orders placed or performed during the hospital encounter of Sep 28, 2023 (from the past 24 hour(s))  Glucose, capillary     Status: Abnormal   Collection Time: 09/23/23  8:29 PM  Result Value Ref Range   Glucose-Capillary 120 (H) 70 - 99 mg/dL  Glucose, capillary     Status: Abnormal   Collection Time: 09/24/23  1:02 AM  Result Value Ref Range   Glucose-Capillary 100 (H) 70 -  99 mg/dL  CBC     Status: Abnormal   Collection Time: 09/24/23  4:06 AM  Result Value Ref Range   WBC 33.0 (H) 4.0 - 10.5 K/uL   RBC 3.21 (L) 4.22 - 5.81 MIL/uL   Hemoglobin 9.6 (L) 13.0 - 17.0 g/dL   HCT 13.0 (L) 86.5 - 78.4 %   MCV 94.4 80.0 - 100.0 fL   MCH 29.9 26.0 - 34.0 pg   MCHC 31.7 30.0 - 36.0 g/dL   RDW 69.6 29.5 - 28.4 %   Platelets 300 150 - 400 K/uL   nRBC 0.0 0.0 - 0.2 %  Comprehensive metabolic panel     Status: Abnormal   Collection Time: 09/24/23  4:06 AM  Result Value Ref Range   Sodium 135 135 - 145 mmol/L   Potassium 4.0 3.5 - 5.1 mmol/L   Chloride 104 98 - 111 mmol/L   CO2 18 (L) 22 - 32 mmol/L   Glucose, Bld 108 (H) 70 - 99 mg/dL   BUN 72 (H) 8 - 23 mg/dL   Creatinine, Ser 1.32 (H) 0.61 - 1.24 mg/dL   Calcium 7.7 (L) 8.9 - 10.3 mg/dL   Total Protein 5.4 (L) 6.5 - 8.1 g/dL   Albumin 1.6 (L) 3.5 - 5.0 g/dL   AST 37 15 - 41 U/L   ALT 55 (H) 0 - 44 U/L   Alkaline Phosphatase 93 38 - 126 U/L   Total Bilirubin 0.6 0.3 - 1.2 mg/dL   GFR, Estimated 8 (L) >60 mL/min   Anion gap 13 5 - 15  Magnesium     Status: Abnormal   Collection Time: 09/24/23  4:06 AM  Result Value Ref Range   Magnesium 2.6 (H) 1.7 - 2.4 mg/dL   Phosphorus     Status: Abnormal   Collection Time: 09/24/23  4:06 AM  Result Value Ref Range   Phosphorus 6.4 (H) 2.5 - 4.6 mg/dL  Glucose, capillary     Status: Abnormal   Collection Time: 09/24/23  7:43 AM  Result Value Ref Range   Glucose-Capillary 127 (H) 70 - 99 mg/dL    I have reviewed pertinent nursing notes, vitals, labs, and images as necessary. I have ordered labwork to follow up on as indicated.  I have reviewed the last notes from staff over past 24 hours. I have discussed patient's care plan and test results with nursing staff, CM/SW, and other staff as appropriate.  Time spent: Greater than 50% of the 55 minute visit was spent in counseling/coordination of care for the patient as laid out in the A&P.   LOS: 4 days   Lewie Chamber, MD [facility] 09/24/2023, 3:56 PM

## 2023-09-24 NOTE — Assessment & Plan Note (Deleted)
-   s/p right endarterectomy with vascular surgery on 11/25/22

## 2023-09-24 NOTE — Assessment & Plan Note (Addendum)
-   in setting of cardiac arrest; extubated 9/23 - O2 requirement uptrended; now improved s/p throacentesis on 9/26 - continue weaning as able

## 2023-09-24 NOTE — Assessment & Plan Note (Signed)
-  Continue Lipitor °

## 2023-09-24 NOTE — Assessment & Plan Note (Signed)
-   EF 60-65% no RWMA; indeterminate diastology - prior hx of EF 45% in June 2023 per notes

## 2023-09-24 NOTE — Hospital Course (Addendum)
Timothy Mata is an 89 yo male with PMH aflutter on Eliquis, COPD, HFrEF, HTN, HLD who initially presented to [facility] on 09/20/2023.  Has had increased urinary frequency. CXR with mild pulm edema. CT ABD/pelvis no acute abnormality; mild left hydroureteronephrosis; small bilateral pleural effusions. WBC/LA elevated. UA with nitrite and large leukocytes. Cultures obtained and given IV fluids. Patient started on Rocephin.  Due to worsening hypotension, he was transferred to the ICU.  He suffered PEA arrest with ROSC after 10 minutes.  He was then transferred to [facility]. He was able to be extubated on 09/22/2023.  He developed decreased UOP and ATN after his cardiac event.

## 2023-09-24 NOTE — Progress Notes (Signed)
Patient ID: Mcarther Malley, male   DOB: April 14, 1933, 89 y.o.   MRN: [id number] S: transferred out of ICU yesterday.  No events overnight.  No significant improvement with UOP O:BP (!) 170/59 (BP Location: Right Arm)   Pulse 66   Temp 97.6 F (36.4 C)   Resp 15   Ht 5\' 10"  (1.778 m)   Wt 37 kg   SpO2 98%   BMI 11.70 kg/m   Intake/Output Summary (Last 24 hours) at 09/24/2023 1158 Last data filed at 09/23/2023 1200 Gross per 24 hour  Intake --  Output 50 ml  Net -50 ml   Intake/Output: I/O last 3 completed shifts: In: 195.3 [I.V.:135.3; IV Piggyback:60] Out: 110 [Urine:110]  Intake/Output this shift:  No intake/output data recorded. Weight change: -79.9 kg Gen:NAD CVS: RRR Resp:CTA Abd: +BS< soft, NT/ND Ext: trace pretibial edema bilaterally  Recent Labs  Lab 10-05-23 1319 09/21/23 0449 09/21/23 1234 09/21/23 1640 09/21/23 1952 09/22/23 0517 09/22/23 1412 09/22/23 2021 09/23/23 0410 09/24/23 0406  NA 137   < > 136 135 134* 135 140 136 133* 135  K 3.5   < > 3.4* 3.9 4.4 4.6 3.9 4.2 4.2 4.0  CL 101   < > 103 102  --  104 111 106 100 104  CO2 26   < > 22 20*  --  22 18* 18* 18* 18*  GLUCOSE 131*   < > 135* 186*  --  109* 78 128* 101* 108*  BUN 39*   < > 33* 36*  --  42* 41* 52* 60* 72*  CREATININE 2.26*   < > 1.94* 2.30*  --  3.15* 3.16* 4.10* 4.63* 6.05*  ALBUMIN 2.7*  --   --  2.1*  --  1.6*  --   --   --  1.6*  CALCIUM 8.1*   < > 7.8* 7.1*  --  7.2* 6.2* 7.3* 7.3* 7.7*  PHOS  --   --   --   --   --   --   --   --  5.5* 6.4*  AST 19  --   --  102*  --  108*  --   --   --  37  ALT 16  --   --  75*  --  88*  --   --   --  55*   < > = values in this interval not displayed.   Liver Function Tests: Recent Labs  Lab 09/21/23 1640 09/22/23 0517 09/24/23 0406  AST 102* 108* 37  ALT 75* 88* 55*  ALKPHOS 96 95 93  BILITOT 1.8* 1.3* 0.6  PROT 5.1* 4.5* 5.4*  ALBUMIN 2.1* 1.6* 1.6*   Recent Labs  Lab 10-05-2023 1319  LIPASE 27   No results for input(s): "AMMONIA"  in the last 168 hours. CBC: Recent Labs  Lab 09/21/23 1234 09/21/23 1639 09/21/23 1640 09/22/23 0517 09/23/23 0410 09/24/23 0406  WBC 32.2* 37.6*  --  39.6* 39.6* 33.0*  HGB 12.3* 10.9*   < > 10.2* 9.7* 9.6*  HCT 38.1* 34.7*   < > 32.8* 31.1* 30.3*  MCV 92.0 94.3  --  93.2 92.8 94.4  PLT 248 250   < > 240 285 300   < > = values in this interval not displayed.   Cardiac Enzymes: No results for input(s): "CKTOTAL", "CKMB", "CKMBINDEX", "TROPONINI" in the last 168 hours. CBG: Recent Labs  Lab 09/23/23 0800 09/23/23 1139 09/23/23 2029 09/24/23 0102 09/24/23 0743  GLUCAP 92  90 120* 100* 127*    Iron Studies: No results for input(s): "IRON", "TIBC", "TRANSFERRIN", "FERRITIN" in the last 72 hours. Studies/Results: US RENAL  Result Date: 09/22/2023 CLINICAL DATA:  Low urine output EXAM: RENAL / URINARY TRACT ULTRASOUND COMPLETE COMPARISON:  None available FINDINGS: Right Kidney: Renal measurements: 10.4 x 6.8 x 6.9 cm = volume: 252 mL. Mild diffuse cortical thinning. No hydronephrosis. Left Kidney: Renal measurements: 11.1 x 6.5 x 4.8 cm = volume: 180 mL. Mild diffuse cortical thinning. No hydronephrosis. Bladder: Collapsed around Foley catheter balloon.  Unable to evaluate. Other: None. IMPRESSION: Mild diffuse bilateral renal cortical thinning consistent with chronic medical renal disease. Electronically Signed   By: Acquanetta Belling M.D.   On: 09/22/2023 16:58    acyclovir ointment   Topical Q3H   amiodarone  200 mg Per Tube Daily   apixaban  2.5 mg Oral BID   aspirin  81 mg Oral Daily   atorvastatin  80 mg Oral q1800   Chlorhexidine Gluconate Cloth  6 each Topical Daily   fluticasone furoate-vilanterol  1 puff Inhalation Daily   levothyroxine  75 mcg Oral Q0600   mouth rinse  15 mL Mouth Rinse 4 times per day   polyethylene glycol  17 g Oral Daily   tamsulosin  0.4 mg Oral QPC supper    BMET    Component Value Date/Time   NA 135 09/24/2023 0406   K 4.0 09/24/2023 0406    CL 104 09/24/2023 0406   CO2 18 (L) 09/24/2023 0406   GLUCOSE 108 (H) 09/24/2023 0406   BUN 72 (H) 09/24/2023 0406   CREATININE 6.05 (H) 09/24/2023 0406   CALCIUM 7.7 (L) 09/24/2023 0406   GFRNONAA 8 (L) 09/24/2023 0406   GFRAA 47 (L) 07/18/2015 0939   CBC    Component Value Date/Time   WBC 33.0 (H) 09/24/2023 0406   RBC 3.21 (L) 09/24/2023 0406   HGB 9.6 (L) 09/24/2023 0406   HCT 30.3 (L) 09/24/2023 0406   PLT 300 09/24/2023 0406   MCV 94.4 09/24/2023 0406   MCH 29.9 09/24/2023 0406   MCHC 31.7 09/24/2023 0406   RDW 15.5 09/24/2023 0406    Assessment/Plan:  AKI/CKD Stage IIIb, oliguric - presumably ischemic ATN in setting of PEA arrest.  Blood pressures have improved.  No indication for dialysis at this time.  Hopefully will start to see an increase in UOP soon.   Avoid nephrotoxic medications including NSAIDs and iodinated intravenous contrast exposure unless the latter is absolutely indicated.   Preferred narcotic agents for pain control are hydromorphone, fentanyl, and methadone. Morphine should not be used.  Avoid Baclofen and avoid oral sodium phosphate and magnesium citrate based laxatives / bowel preps.  Continue strict Input and Output monitoring. Will monitor the patient closely with you and intervene or adjust therapy as indicated by changes in clinical status/labs  PEA arrest and septic shock - complaining of chest pain, likely from CPR.  Off pressors and bp improved.  Abx per PCCM Acute respiratory failure post arrest with underlying COPD - weaned to room air.  Per PCCM Afib/flutter - on eliquis and amiodarone. Normocytic anemia - likely combination of AKI/CKD and anemia of critical illness.  Transfuse for Hgb <7. HFrEF - EF 60% and RV function low normal.  Hold diuretics for now and follow.   Irena Cords, MD [employer] [phone]

## 2023-09-24 NOTE — Assessment & Plan Note (Addendum)
-   patient has history of CKD3b. Baseline creat ~ 1.9 - 2, eGFR~ 33-40 - at baseline on admission but then s/p cardiac arrest likely has developed ATN from hypoperfusion; other concern was for possible BOO as output picked up after foley exchange but was associate with large dark urine, ?hematuria but Hgb didn't drop much - nephrology continuing to follow - has been recommended for initiation of HD per nephrology - RIJ placed by IR on 9/27 for HD access

## 2023-09-24 NOTE — Plan of Care (Signed)
  Problem: Education: Goal: Knowledge of General Education information will improve Description: Including pain rating scale, medication(s)/side effects and non-pharmacologic comfort measures Outcome: Progressing   Problem: Health Behavior/Discharge Planning: Goal: Ability to manage health-related needs will improve Outcome: Progressing   Problem: Clinical Measurements: Goal: Ability to maintain clinical measurements within normal limits will improve Outcome: Progressing   Problem: Activity: Goal: Risk for activity intolerance will decrease Outcome: Progressing   Problem: Nutrition: Goal: Adequate nutrition will be maintained Outcome: Progressing   Problem: Pain Managment: Goal: General experience of comfort will improve Outcome: Progressing   Problem: Safety: Goal: Ability to remain free from injury will improve Outcome: Progressing   Problem: Skin Integrity: Goal: Risk for impaired skin integrity will decrease Outcome: Progressing   Problem: Education: Goal: Ability to describe self-care measures that may prevent or decrease complications (Diabetes Survival Skills Education) will improve Outcome: Progressing   Problem: Coping: Goal: Ability to adjust to condition or change in health will improve Outcome: Progressing   Problem: Fluid Volume: Goal: Ability to maintain a balanced intake and output will improve Outcome: Progressing   Problem: Metabolic: Goal: Ability to maintain appropriate glucose levels will improve Outcome: Progressing   Problem: Nutritional: Goal: Maintenance of adequate nutrition will improve Outcome: Progressing   Problem: Skin Integrity: Goal: Risk for impaired skin integrity will decrease Outcome: Progressing   Problem: Tissue Perfusion: Goal: Adequacy of tissue perfusion will improve Outcome: Progressing

## 2023-09-24 NOTE — Assessment & Plan Note (Signed)
-   due to UTI on admission - Ucx indeterminate; completing empiric course

## 2023-09-24 NOTE — Assessment & Plan Note (Signed)
-   Avoiding hypotension but will start PRN's in case of persistently elevated blood pressure

## 2023-09-25 ENCOUNTER — Inpatient Hospital Stay (HOSPITAL_COMMUNITY): Payer: Medicare HMO

## 2023-09-25 ENCOUNTER — Other Ambulatory Visit: Payer: Self-pay | Admitting: Student

## 2023-09-25 DIAGNOSIS — N170 Acute kidney failure with tubular necrosis: Secondary | ICD-10-CM | POA: Diagnosis not present

## 2023-09-25 DIAGNOSIS — I469 Cardiac arrest, cause unspecified: Secondary | ICD-10-CM | POA: Diagnosis not present

## 2023-09-25 DIAGNOSIS — J90 Pleural effusion, not elsewhere classified: Secondary | ICD-10-CM

## 2023-09-25 DIAGNOSIS — A419 Sepsis, unspecified organism: Secondary | ICD-10-CM | POA: Diagnosis not present

## 2023-09-25 HISTORY — PX: IR THORACENTESIS ASP PLEURAL SPACE W/IMG GUIDE: IMG5380

## 2023-09-25 LAB — CULTURE, BLOOD (ROUTINE X 2)
Culture: NO GROWTH
Culture: NO GROWTH
Special Requests: ADEQUATE
Special Requests: ADEQUATE
Special Requests: ADEQUATE

## 2023-09-25 LAB — RENAL FUNCTION PANEL
Albumin: 1.8 g/dL — ABNORMAL LOW (ref 3.5–5.0)
Anion gap: 14 (ref 5–15)
BUN: 82 mg/dL — ABNORMAL HIGH (ref 8–23)
CO2: 16 mmol/L — ABNORMAL LOW (ref 22–32)
Calcium: 7.8 mg/dL — ABNORMAL LOW (ref 8.9–10.3)
Chloride: 106 mmol/L (ref 98–111)
Creatinine, Ser: 7.36 mg/dL — ABNORMAL HIGH (ref 0.61–1.24)
GFR, Estimated: 7 mL/min — ABNORMAL LOW (ref >60)
Glucose, Bld: 93 mg/dL (ref 70–99)
Phosphorus: 7.8 mg/dL — ABNORMAL HIGH (ref 2.5–4.6)
Potassium: 4.4 mmol/L (ref 3.5–5.1)
Sodium: 136 mmol/L (ref 135–145)

## 2023-09-25 LAB — CBC
HCT: 30.9 % — ABNORMAL LOW (ref 39.0–52.0)
Hemoglobin: 9.8 g/dL — ABNORMAL LOW (ref 13.0–17.0)
MCH: 29.7 pg (ref 26.0–34.0)
MCHC: 31.7 g/dL (ref 30.0–36.0)
MCV: 93.6 fL (ref 80.0–100.0)
Platelets: 343 10*3/uL (ref 150–400)
RBC: 3.3 MIL/uL — ABNORMAL LOW (ref 4.22–5.81)
RDW: 15.7 % — ABNORMAL HIGH (ref 11.5–15.5)
WBC: 26.9 10*3/uL — ABNORMAL HIGH (ref 4.0–10.5)
nRBC: 0 % (ref 0.0–0.2)

## 2023-09-25 LAB — LACTATE DEHYDROGENASE, PLEURAL OR PERITONEAL FLUID: LD, Fluid: 601 U/L — ABNORMAL HIGH (ref 3–23)

## 2023-09-25 LAB — BODY FLUID CELL COUNT WITH DIFFERENTIAL
Eos, Fluid: 0 %
Lymphs, Fluid: 18 %
Monocyte-Macrophage-Serous Fluid: 2 % — ABNORMAL LOW (ref 50–90)
Neutrophil Count, Fluid: 80 % — ABNORMAL HIGH (ref 0–25)
Total Nucleated Cell Count, Fluid: 3300 cu mm — ABNORMAL HIGH (ref 0–1000)

## 2023-09-25 LAB — GLUCOSE, CAPILLARY
Glucose-Capillary: 86 mg/dL (ref 70–99)
Glucose-Capillary: 94 mg/dL (ref 70–99)

## 2023-09-25 LAB — MAGNESIUM: Magnesium: 2.8 mg/dL — ABNORMAL HIGH (ref 1.7–2.4)

## 2023-09-25 LAB — PROTEIN, PLEURAL OR PERITONEAL FLUID: Total protein, fluid: <3 g/dL

## 2023-09-25 MED ORDER — LIDOCAINE HCL (PF) 1 % IJ SOLN
30.0000 mL | Freq: Once | INTRAMUSCULAR | Status: AC
  Administered 2023-09-25: 10 mL

## 2023-09-25 MED ORDER — FUROSEMIDE 10 MG/ML IJ SOLN
80.0000 mg | Freq: Every day | INTRAMUSCULAR | Status: DC
  Administered 2023-09-25 – 2023-09-26 (×2): 80 mg via INTRAVENOUS
  Filled 2023-09-25 (×2): qty 8

## 2023-09-25 MED ORDER — LIDOCAINE HCL 1 % IJ SOLN
INTRAMUSCULAR | Status: AC
  Filled 2023-09-25: qty 20

## 2023-09-25 MED FILL — Medication: Qty: 1 | Status: AC

## 2023-09-25 NOTE — Assessment & Plan Note (Addendum)
-   mod to large left effusion on CXR 9/25. In setting of ATN/volume overload would expect bilateral. With recent CPR does have risk for trauma/hemothorax notably with Hgb drop (which still could be multifactorial) - s/p thoracentesis on 9/26; noted to be bloody fluid and is exudative by criteria so likely this was hemothorax

## 2023-09-25 NOTE — Progress Notes (Signed)
Progress Note    Timothy Mata   WGN:[id number]  DOB: 06-08-1933  DOA: 09/14/2023     5 PCP: Royann Shivers, PA-C  Initial CC: initially abd pain  Hospital Course: Mr. Timothy Mata is an 89 yo male with PMH aflutter on Eliquis, COPD, HFrEF, HTN, HLD who initially presented to [facility] on 09/[facility].  Has had increased urinary frequency. CXR with mild pulm edema. CT ABD/pelvis no acute abnormality; mild left hydroureteronephrosis; small bilateral pleural effusions. WBC/LA elevated. UA with nitrite and large leukocytes. Cultures obtained and given IV fluids. Patient started on Rocephin.  Due to worsening hypotension, he was transferred to the ICU.  He suffered PEA arrest with ROSC after 10 minutes.  He was then transferred to [facility]. He was able to be extubated on 09/22/2023.  He developed decreased UOP and ATN after his cardiac event.   Interval History:  No events overnight.  Oxygen turned up slightly to 3 L.  Does have some shortness of breath with exertion but not at rest. He is amenable with thoracentesis if recommended.  Assessment and Plan: * Septic shock (HCC)-resolved as of 09/24/2023 - due to UTI on admission - Ucx indeterminate; completing empiric course   Pleural effusion on left - mod to large left effusion on CXR 9/25. In setting of ATN/volume overload would expect bilateral. With recent CPR does have risk for trauma/hemothorax notably with Hgb drop (which still could be multifactorial) - given unilateral in nature, will go ahead and pursue thoracentesis which may also help alleviate some of the SOB and hypoxia  Acute renal failure superimposed on stage 3b chronic kidney disease (HCC) - patient has history of CKD3b. Baseline creat ~ 1.9 - 2, eGFR~ 33-40 - at baseline on admission but then s/p cardiac arrest likely has developed ATN from hypoperfusion - continue trending labs; CO2 starting to drop further (16 today) and labs overall worse - nephrology continuing to  follow - continue foley and monitor output (some increase in output documented since yesterday actually, ~200 cc)   Cardiac arrest (HCC)-resolved as of 09/24/2023 - decompensated in setting of septic shock from UTI; found in PEA arrest with 10 minutes to ROSC - echo obtained 9/23: EF 60-65%, no RWMA, indeterminate diastology; small circumferential pericardial effusion  Acute respiratory failure with hypoxia (HCC) - in setting of cardiac arrest; extubated 9/23 - O2 requirement starting to uptrend slightly - now on 3L - CXR from 9/25 shows moderate/large left effusion - will be dependent on O2 until fluid overload resolves (see AKI)  Normocytic anemia - Prior baseline appears to be around 11 to 12 g/dL -Some downtrend expected in setting of volume resuscitation and also worsened renal function   Asymptomatic carotid artery stenosis without infarction, right - s/p right endarterectomy with vascular surgery on 11/25/22 -On aspirin and Eliquis -Continue Lipitor  COPD (chronic obstructive pulmonary disease) (HCC) - no s/s exac  HFrEF (heart failure with reduced ejection fraction) (HCC) - EF 60-65% no RWMA; indeterminate diastology - prior hx of EF 45% in June 2023 per notes   PAF (paroxysmal atrial fibrillation) (HCC) - Continue Eliquis and aspirin -Continue amiodarone  Hyperlipidemia - Continue Lipitor  Benign prostatic hyperplasia - Continue Flomax  Essential hypertension - Avoiding hypotension but will start PRN's in case of persistently elevated blood pressure   Old records reviewed in assessment of this patient  Antimicrobials: Rocephin 09/02/2023 >> current  DVT prophylaxis:  apixaban (ELIQUIS) tablet 2.5 mg Start: 09/22/23 1500 apixaban (ELIQUIS) tablet 2.5 mg  Code Status:   Code Status: Full Code  Mobility Assessment (Last 72 Hours)     Mobility Assessment     Row Name 09/25/23 1047 09/25/23 0000 09/24/23 0735 09/23/23 2115 09/23/23 1400   Does patient  have an order for bedrest or is patient medically unstable -- No - Continue assessment No - Continue assessment No - Continue assessment --   What is the highest level of mobility based on the progressive mobility assessment? Level 5 (Walks with assist in room/hall) - Balance while stepping forward/back and can walk in room with assist - Complete Level 5 (Walks with assist in room/hall) - Balance while stepping forward/back and can walk in room with assist - Complete Level 5 (Walks with assist in room/hall) - Balance while stepping forward/back and can walk in room with assist - Complete Level 5 (Walks with assist in room/hall) - Balance while stepping forward/back and can walk in room with assist - Complete Level 5 (Walks with assist in room/hall) - Balance while stepping forward/back and can walk in room with assist - Complete   Is the above level different from baseline mobility prior to current illness? -- Yes - Recommend PT order Yes - Recommend PT order -- --    Row Name 09/23/23 1336 09/23/23 0858         Does patient have an order for bedrest or is patient medically unstable No - Continue assessment --      What is the highest level of mobility based on the progressive mobility assessment? Level 5 (Walks with assist in room/hall) - Balance while stepping forward/back and can walk in room with assist - Complete Level 5 (Walks with assist in room/hall) - Balance while stepping forward/back and can walk in room with assist - Complete      Is the above level different from baseline mobility prior to current illness? Yes - Recommend PT order --               Barriers to discharge: none Disposition Plan:  Home Status is: Inpt  Objective: Blood pressure (!) 115/43, pulse 84, temperature (!) 97.2 F (36.2 C), temperature source Oral, resp. rate 16, height 5\' 10"  (1.778 m), weight 37 kg, SpO2 92%.  Examination:  Physical Exam Constitutional:      General: He is not in acute distress.     Appearance: Normal appearance.  HENT:     Head: Normocephalic and atraumatic.     Mouth/Throat:     Mouth: Mucous membranes are moist.  Eyes:     Extraocular Movements: Extraocular movements intact.  Cardiovascular:     Rate and Rhythm: Normal rate and regular rhythm.  Pulmonary:     Effort: Pulmonary effort is normal. No respiratory distress.     Breath sounds: Normal breath sounds. No wheezing.  Abdominal:     General: Bowel sounds are normal. There is no distension.     Palpations: Abdomen is soft.     Tenderness: There is no abdominal tenderness.  Musculoskeletal:        General: Normal range of motion.     Cervical back: Normal range of motion and neck supple.     Right lower leg: Edema (2+ pitting edema) present.     Left lower leg: Edema (2+ pitting edema) present.     Comments: Some swelling now being appreciated in arms  Skin:    General: Skin is warm and dry.  Neurological:     General: No focal deficit  present.     Mental Status: He is alert.  Psychiatric:        Mood and Affect: Mood normal.        Behavior: Behavior normal.      Consultants:  Nephrology   Procedures:    Data Reviewed: Results for orders placed or performed during the hospital encounter of October 05, 2023 (from the past 24 hour(s))  Glucose, capillary     Status: None   Collection Time: 09/24/23  4:20 PM  Result Value Ref Range   Glucose-Capillary 98 70 - 99 mg/dL   Comment 1 Document in Chart   Glucose, capillary     Status: None   Collection Time: 09/24/23  7:34 PM  Result Value Ref Range   Glucose-Capillary 98 70 - 99 mg/dL  CBC     Status: Abnormal   Collection Time: 09/25/23  3:36 AM  Result Value Ref Range   WBC 26.9 (H) 4.0 - 10.5 K/uL   RBC 3.30 (L) 4.22 - 5.81 MIL/uL   Hemoglobin 9.8 (L) 13.0 - 17.0 g/dL   HCT 16.1 (L) 09.6 - 04.5 %   MCV 93.6 80.0 - 100.0 fL   MCH 29.7 26.0 - 34.0 pg   MCHC 31.7 30.0 - 36.0 g/dL   RDW 40.9 (H) 81.1 - 91.4 %   Platelets 343 150 - 400 K/uL    nRBC 0.0 0.0 - 0.2 %  Renal function panel     Status: Abnormal   Collection Time: 09/25/23  3:36 AM  Result Value Ref Range   Sodium 136 135 - 145 mmol/L   Potassium 4.4 3.5 - 5.1 mmol/L   Chloride 106 98 - 111 mmol/L   CO2 16 (L) 22 - 32 mmol/L   Glucose, Bld 93 70 - 99 mg/dL   BUN 82 (H) 8 - 23 mg/dL   Creatinine, Ser 7.82 (H) 0.61 - 1.24 mg/dL   Calcium 7.8 (L) 8.9 - 10.3 mg/dL   Phosphorus 7.8 (H) 2.5 - 4.6 mg/dL   Albumin 1.8 (L) 3.5 - 5.0 g/dL   GFR, Estimated 7 (L) >60 mL/min   Anion gap 14 5 - 15  Magnesium     Status: Abnormal   Collection Time: 09/25/23  3:36 AM  Result Value Ref Range   Magnesium 2.8 (H) 1.7 - 2.4 mg/dL  Glucose, capillary     Status: None   Collection Time: 09/25/23  4:56 AM  Result Value Ref Range   Glucose-Capillary 86 70 - 99 mg/dL    I have reviewed pertinent nursing notes, vitals, labs, and images as necessary. I have ordered labwork to follow up on as indicated.  I have reviewed the last notes from staff over past 24 hours. I have discussed patient's care plan and test results with nursing staff, CM/SW, and other staff as appropriate.  Time spent: Greater than 50% of the 55 minute visit was spent in counseling/coordination of care for the patient as laid out in the A&P.   LOS: 5 days   Lewie Chamber, MD [facility] 09/25/2023, 1:23 PM

## 2023-09-25 NOTE — Progress Notes (Signed)
Patient ID: Timothy Mata, male   DOB: 1933/11/04, 89 y.o.   MRN: [id number] S: No new complaints.  Wife reports that he is not drinking much. O:BP (!) 115/43 (BP Location: Right Arm)   Pulse 84   Temp (!) 97.2 F (36.2 C) (Oral)   Resp 16   Ht 5\' 10"  (1.778 m)   Wt 37 kg   SpO2 95%   BMI 11.70 kg/m   Intake/Output Summary (Last 24 hours) at 09/25/2023 0950 Last data filed at 09/24/2023 1800 Gross per 24 hour  Intake --  Output 200 ml  Net -200 ml   Intake/Output: I/O last 3 completed shifts: In: -  Out: 200 [Urine:200]  Intake/Output this shift:  No intake/output data recorded. Weight change:  VHQ:[id number] ill-appearing CVS:RRR Resp:CTA Abd: obese, +BS, soft, NT/ND Ext: 1+ pretibial edema  Recent Labs  Lab 09/16/2023 1319 09/21/23 0449 09/21/23 1640 09/21/23 1952 09/22/23 0517 09/22/23 1412 09/22/23 2021 09/23/23 0410 09/24/23 0406 09/25/23 0336  NA 137   < > 135 134* 135 140 136 133* 135 136  K 3.5   < > 3.9 4.4 4.6 3.9 4.2 4.2 4.0 4.4  CL 101   < > 102  --  104 111 106 100 104 106  CO2 26   < > 20*  --  22 18* 18* 18* 18* 16*  GLUCOSE 131*   < > 186*  --  109* 78 128* 101* 108* 93  BUN 39*   < > 36*  --  42* 41* 52* 60* 72* 82*  CREATININE 2.26*   < > 2.30*  --  3.15* 3.16* 4.10* 4.63* 6.05* 7.36*  ALBUMIN 2.7*  --  2.1*  --  1.6*  --   --   --  1.6* 1.8*  CALCIUM 8.1*   < > 7.1*  --  7.2* 6.2* 7.3* 7.3* 7.7* 7.8*  PHOS  --   --   --   --   --   --   --  5.5* 6.4* 7.8*  AST 19  --  102*  --  108*  --   --   --  37  --   ALT 16  --  75*  --  88*  --   --   --  55*  --    < > = values in this interval not displayed.   Liver Function Tests: Recent Labs  Lab 09/21/23 1640 09/22/23 0517 09/24/23 0406 09/25/23 0336  AST 102* 108* 37  --   ALT 75* 88* 55*  --   ALKPHOS 96 95 93  --   BILITOT 1.8* 1.3* 0.6  --   PROT 5.1* 4.5* 5.4*  --   ALBUMIN 2.1* 1.6* 1.6* 1.8*   Recent Labs  Lab 09/12/2023 1319  LIPASE 27   No results for input(s): "AMMONIA" in  the last 168 hours. CBC: Recent Labs  Lab 09/21/23 1639 09/21/23 1640 09/22/23 0517 09/23/23 0410 09/24/23 0406 09/25/23 0336  WBC 37.6*  --  39.6* 39.6* 33.0* 26.9*  HGB 10.9*   < > 10.2* 9.7* 9.6* 9.8*  HCT 34.7*   < > 32.8* 31.1* 30.3* 30.9*  MCV 94.3  --  93.2 92.8 94.4 93.6  PLT 250   < > 240 285 300 343   < > = values in this interval not displayed.   Cardiac Enzymes: No results for input(s): "CKTOTAL", "CKMB", "CKMBINDEX", "TROPONINI" in the last 168 hours. CBG: Recent Labs  Lab 09/24/23 0102 09/24/23  4098 09/24/23 1620 09/24/23 1934 09/25/23 0456  GLUCAP 100* 127* 98 98 86    Iron Studies: No results for input(s): "IRON", "TIBC", "TRANSFERRIN", "FERRITIN" in the last 72 hours. Studies/Results: DG CHEST PORT 1 VIEW  Result Date: 09/24/2023 CLINICAL DATA:  Cough EXAM: PORTABLE CHEST 1 VIEW COMPARISON:  09/21/2023 FINDINGS: Endotracheal tube and nasogastric tube have been removed. Right arm PICC tip in the SVC above the right atrium. Enlarged cardiac silhouette as seen previously. Mild chronic pleural blunting on the right. Moderate to large left effusion with atelectasis of the left lower lung. IMPRESSION: 1. Endotracheal tube and nasogastric tube removed. Right arm PICC tip in the SVC above the right atrium. 2. Moderate to large left effusion with atelectasis of the left lower lung. Electronically Signed   By: Paulina Fusi M.D.   On: 09/24/2023 16:22    acyclovir ointment   Topical Q3H   amiodarone  200 mg Per Tube Daily   apixaban  2.5 mg Oral BID   aspirin  81 mg Oral Daily   atorvastatin  80 mg Oral q1800   Chlorhexidine Gluconate Cloth  6 each Topical Daily   fluticasone furoate-vilanterol  1 puff Inhalation Daily   levothyroxine  75 mcg Oral Q0600   mouth rinse  15 mL Mouth Rinse 4 times per day   polyethylene glycol  17 g Oral Daily   tamsulosin  0.4 mg Oral QPC supper    BMET    Component Value Date/Time   NA 136 09/25/2023 0336   K 4.4 09/25/2023  0336   CL 106 09/25/2023 0336   CO2 16 (L) 09/25/2023 0336   GLUCOSE 93 09/25/2023 0336   BUN 82 (H) 09/25/2023 0336   CREATININE 7.36 (H) 09/25/2023 0336   CALCIUM 7.8 (L) 09/25/2023 0336   GFRNONAA 7 (L) 09/25/2023 0336   GFRAA 47 (L) 07/18/2015 0939   CBC    Component Value Date/Time   WBC 26.9 (H) 09/25/2023 0336   RBC 3.30 (L) 09/25/2023 0336   HGB 9.8 (L) 09/25/2023 0336   HCT 30.9 (L) 09/25/2023 0336   PLT 343 09/25/2023 0336   MCV 93.6 09/25/2023 0336   MCH 29.7 09/25/2023 0336   MCHC 31.7 09/25/2023 0336   RDW 15.7 (H) 09/25/2023 0336    Assessment/Plan:  AKI/CKD Stage IIIb, oliguric - presumably ischemic ATN in setting of PEA arrest.  Blood pressures have improved.  No indication for dialysis at this time.  Unfortunately no UOP documented.  Wife reports that he is not drinking anything.  If BUN/Cr cont to climb, may need to initiate short term dialysis.   Avoid nephrotoxic medications including NSAIDs and iodinated intravenous contrast exposure unless the latter is absolutely indicated.   Preferred narcotic agents for pain control are hydromorphone, fentanyl, and methadone. Morphine should not be used.  Avoid Baclofen and avoid oral sodium phosphate and magnesium citrate based laxatives / bowel preps.  Continue strict Input and Output monitoring. Will monitor the patient closely with you and intervene or adjust therapy as indicated by changes in clinical status/labs  PEA arrest and septic shock - complaining of chest pain, likely from CPR.  Off pressors and bp improved.  Abx per PCCM Acute respiratory failure post arrest with underlying COPD - weaned to room air.  Per PCCM Afib/flutter - on eliquis and amiodarone. Normocytic anemia - likely combination of AKI/CKD and anemia of critical illness.  Transfuse for Hgb <7. HFrEF - EF 60% and RV function low normal.  Worsening  edema, will dose with IV lasix and follow response.    Irena Cords, MD [facility]

## 2023-09-25 NOTE — Progress Notes (Signed)
Physical Therapy Treatment Patient Details Name: Timothy Mata MRN: [id number] DOB: 08-22-1933 Today's Date: 09/25/2023   History of Present Illness 89 y.o. male admitted at [facility] 9/21 with abdominal pain, UTI, sepsis.  9/22 cardiac arrest,  PEA 8-10 min with transfer to [facility]. Intubation 9/22-9/23. PMHx: Aflutter on Eliquis and amiodarone, BPH, COPD, HTN, gout    PT Comments  Pt pleasant and demonstrates decreased activity tolerance this session able to walk 50' x 2 trials with chair follow and reliance on RW. Pt with SPO2 88-98% on RA with increased fatigue and desaturation end of session with return to 1L at 92%. Pt educated for tranfers, HEP and gait progression with pt limited by fatigue. Encouraged OOB, walking and continued activity progression. Will continue to follow with plan appropriate.    If plan is discharge home, recommend the following: Assistance with cooking/housework;A lot of help with bathing/dressing/bathroom;A little help with bathing/dressing/bathroom;Assist for transportation   Can travel by private vehicle        Equipment Recommendations  None recommended by PT    Recommendations for Other Services       Precautions / Restrictions Precautions Precautions: Fall;Other (comment) Precaution Comments: watch sats     Mobility  Bed Mobility               General bed mobility comments: pt sitting EOB on arrival, chair end of session    Transfers Overall transfer level: Needs assistance   Transfers: Sit to/from Stand Sit to Stand: Contact guard assist           General transfer comment: cues for hand placement and sequence with pt able to stand from EOB and from recliner x 6 trials with reliance on armrests    Ambulation/Gait Ambulation/Gait assistance: Contact guard assist Gait Distance (Feet): 50 Feet Assistive device: Rolling walker (2 wheels) Gait Pattern/deviations: Step-through pattern, Decreased stride length, Trunk flexed   Gait velocity  interpretation: 1.31 - 2.62 ft/sec, indicative of limited community ambulator   General Gait Details: cues for posture, proximity to RW and breathing technique, pt with slow gait and needing chair pulled to him to rest. Pt completing 50' x 2 trials with seated rest between and denied further ambulation with SPO2 90-95% on RA, HR 86-98. Controlled speed without rushing this session   Stairs             Wheelchair Mobility     Tilt Bed    Modified Rankin (Stroke Patients Only)       Balance Overall balance assessment: Needs assistance Sitting-balance support: No upper extremity supported, Feet supported Sitting balance-Leahy Scale: Good     Standing balance support: Bilateral upper extremity supported, During functional activity, Reliant on assistive device for balance Standing balance-Leahy Scale: Poor Standing balance comment: RW for gait                            Cognition Arousal: Alert Behavior During Therapy: Flat affect Overall Cognitive Status: Within Functional Limits for tasks assessed                                          Exercises General Exercises - Lower Extremity Long Arc Quad: AROM, Both, Seated, 15 reps Hip Flexion/Marching: AROM, Both, Seated, 10 reps    General Comments        Pertinent Vitals/Pain Pain Assessment Pain  Assessment: No/denies pain    Home Living                          Prior Function            PT Goals (current goals can now be found in the care plan section) Progress towards PT goals: Progressing toward goals    Frequency    Min 1X/week      PT Plan      Co-evaluation              AM-PAC PT "6 Clicks" Mobility   Outcome Measure  Help needed turning from your back to your side while in a flat bed without using bedrails?: A Little Help needed moving from lying on your back to sitting on the side of a flat bed without using bedrails?: A Little Help needed  moving to and from a bed to a chair (including a wheelchair)?: A Little Help needed standing up from a chair using your arms (e.g., wheelchair or bedside chair)?: A Little Help needed to walk in hospital room?: A Little Help needed climbing 3-5 steps with a railing? : A Lot 6 Click Score: 17    End of Session   Activity Tolerance: Patient tolerated treatment well Patient left: in chair;with call bell/phone within reach;with family/visitor present Nurse Communication: Mobility status PT Visit Diagnosis: Other abnormalities of gait and mobility (R26.89);History of falling (Z91.81)     Time: 2440-1027 PT Time Calculation (min) (ACUTE ONLY): 29 min  Charges:    $Gait Training: 8-22 mins $Therapeutic Exercise: 8-22 mins PT General Charges $$ ACUTE PT VISIT: 1 Visit                     Merryl Hacker, PT Acute Rehabilitation Services Office: [phone]    Enedina Finner Darian Cansler 09/25/2023, 10:48 AM

## 2023-09-26 ENCOUNTER — Inpatient Hospital Stay (HOSPITAL_COMMUNITY): Payer: Medicare HMO

## 2023-09-26 DIAGNOSIS — I469 Cardiac arrest, cause unspecified: Secondary | ICD-10-CM | POA: Diagnosis not present

## 2023-09-26 DIAGNOSIS — A419 Sepsis, unspecified organism: Secondary | ICD-10-CM | POA: Diagnosis not present

## 2023-09-26 DIAGNOSIS — J90 Pleural effusion, not elsewhere classified: Secondary | ICD-10-CM | POA: Diagnosis not present

## 2023-09-26 DIAGNOSIS — N170 Acute kidney failure with tubular necrosis: Secondary | ICD-10-CM | POA: Diagnosis not present

## 2023-09-26 HISTORY — PX: IR US GUIDE VASC ACCESS RIGHT: IMG2390

## 2023-09-26 HISTORY — PX: IR FLUORO GUIDE CV LINE RIGHT: IMG2283

## 2023-09-26 LAB — CBC
HCT: 30.5 % — ABNORMAL LOW (ref 39.0–52.0)
Hemoglobin: 9.6 g/dL — ABNORMAL LOW (ref 13.0–17.0)
MCH: 29.3 pg (ref 26.0–34.0)
MCHC: 31.5 g/dL (ref 30.0–36.0)
MCV: 93 fL (ref 80.0–100.0)
Platelets: 402 10*3/uL — ABNORMAL HIGH (ref 150–400)
RBC: 3.28 MIL/uL — ABNORMAL LOW (ref 4.22–5.81)
RDW: 15.7 % — ABNORMAL HIGH (ref 11.5–15.5)
WBC: 23.7 10*3/uL — ABNORMAL HIGH (ref 4.0–10.5)
nRBC: 0.1 % (ref 0.0–0.2)

## 2023-09-26 LAB — PATHOLOGIST SMEAR REVIEW

## 2023-09-26 LAB — HEPATITIS B SURFACE ANTIGEN: Hepatitis B Surface Ag: NONREACTIVE

## 2023-09-26 LAB — RENAL FUNCTION PANEL
Albumin: 1.6 g/dL — ABNORMAL LOW (ref 3.5–5.0)
Anion gap: 14 (ref 5–15)
BUN: 95 mg/dL — ABNORMAL HIGH (ref 8–23)
CO2: 16 mmol/L — ABNORMAL LOW (ref 22–32)
Calcium: 7.8 mg/dL — ABNORMAL LOW (ref 8.9–10.3)
Chloride: 105 mmol/L (ref 98–111)
Creatinine, Ser: 8.5 mg/dL — ABNORMAL HIGH (ref 0.61–1.24)
GFR, Estimated: 6 mL/min — ABNORMAL LOW (ref >60)
Glucose, Bld: 94 mg/dL (ref 70–99)
Phosphorus: 9.7 mg/dL — ABNORMAL HIGH (ref 2.5–4.6)
Potassium: 4.3 mmol/L (ref 3.5–5.1)
Sodium: 135 mmol/L (ref 135–145)

## 2023-09-26 LAB — LACTATE DEHYDROGENASE: LDH: 273 U/L — ABNORMAL HIGH (ref 98–192)

## 2023-09-26 LAB — MAGNESIUM: Magnesium: 3 mg/dL — ABNORMAL HIGH (ref 1.7–2.4)

## 2023-09-26 MED ORDER — LIDOCAINE-PRILOCAINE 2.5-2.5 % EX CREA: 1.0000 | TOPICAL_CREAM | CUTANEOUS | Status: DC | PRN

## 2023-09-26 MED ORDER — HEPARIN SODIUM (PORCINE) 1000 UNIT/ML IJ SOLN: 10.0000 mL | Freq: Once | INTRAMUSCULAR | Status: DC

## 2023-09-26 MED ORDER — LIDOCAINE HCL (PF) 1 % IJ SOLN: 5.0000 mL | INTRAMUSCULAR | Status: DC | PRN

## 2023-09-26 MED ORDER — LIDOCAINE HCL 1 % IJ SOLN
INTRAMUSCULAR | Status: AC
  Filled 2023-09-26: qty 20

## 2023-09-26 MED ORDER — HEPARIN SODIUM (PORCINE) 1000 UNIT/ML DIALYSIS: 1000.0000 [IU] | INTRAMUSCULAR | Status: DC | PRN

## 2023-09-26 MED ORDER — CHLORHEXIDINE GLUCONATE CLOTH 2 % EX PADS: 6.0000 | MEDICATED_PAD | Freq: Every day | CUTANEOUS | Status: DC

## 2023-09-26 MED ORDER — PENTAFLUOROPROP-TETRAFLUOROETH EX AERO: 1.0000 | INHALATION_SPRAY | CUTANEOUS | Status: DC | PRN

## 2023-09-26 MED ORDER — SEVELAMER CARBONATE 800 MG PO TABS
800.0000 mg | ORAL_TABLET | Freq: Three times a day (TID) | ORAL | Status: DC
  Administered 2023-09-26 – 2023-09-28 (×7): 800 mg via ORAL
  Filled 2023-09-26 (×8): qty 1

## 2023-09-26 MED ORDER — HEPARIN SODIUM (PORCINE) 1000 UNIT/ML IJ SOLN
INTRAMUSCULAR | Status: AC
  Filled 2023-09-26: qty 10

## 2023-09-26 MED ORDER — ALTEPLASE 2 MG IJ SOLR: 2.0000 mg | Freq: Once | INTRAMUSCULAR | Status: DC | PRN

## 2023-09-26 MED ORDER — LIDOCAINE HCL 1 % IJ SOLN
20.0000 mL | Freq: Once | INTRAMUSCULAR | Status: DC
  Filled 2023-09-26: qty 20

## 2023-09-26 NOTE — Progress Notes (Signed)
Pt was taken to HD. Nursing plan of care ongoing.

## 2023-09-26 NOTE — Progress Notes (Signed)
Physical Therapy Treatment Patient Details Name: Timothy Mata MRN: [id number] DOB: [birth year] Today's Date: 09/26/2023   History of Present Illness 89 y.o. male admitted at [facility] 09-Oct-2023 with abdominal pain, UTI, sepsis.  9/22 cardiac arrest,  PEA 8-10 min with transfer to [facility]. Intubation 9/22-9/23. PMHx: Aflutter on Eliquis and amiodarone, BPH, COPD, HTN, gout    PT Comments  Pt pleasant, reports lack of sleep and fatigue. PT on 2L on arrival with SPO2 98% and able to maintain 94% on 1L at rest end of session. Pt with continued decline in activity tolerance due to fatigue and SOb with activity. Pt able to walk short distance and toilet but could not tolerate further mobility at this time. Encouraged OOB daily with nursing staff and continued gait trials. Will continue to follow.      If plan is discharge home, recommend the following: Assistance with cooking/housework;A lot of help with bathing/dressing/bathroom;A little help with bathing/dressing/bathroom;Assist for transportation   Can travel by private vehicle        Equipment Recommendations  None recommended by PT    Recommendations for Other Services       Precautions / Restrictions Precautions Precautions: Fall;Other (comment) Precaution Comments: watch sats, incontinent stool     Mobility  Bed Mobility Overal bed mobility: Modified Independent             General bed mobility comments: pt able to transition from supine to sitting without physical assist, increased time    Transfers Overall transfer level: Needs assistance   Transfers: Sit to/from Stand Sit to Stand: Min assist           General transfer comment: min assist to rise from bed, recliner and toilet with cues for hand placement and sequence, assist to complete power up    Ambulation/Gait Ambulation/Gait assistance: Contact guard assist Gait Distance (Feet): 50 Feet Assistive device: Rolling walker (2 wheels) Gait Pattern/deviations:  Step-through pattern, Decreased stride length, Trunk flexed   Gait velocity interpretation: 1.31 - 2.62 ft/sec, indicative of limited community ambulator   General Gait Details: cues for posture, proximity to RW and breathing technique. pt with chair pulled to him and limited by fatigue. PT walked 50', 40',  10' with seated rest between each trial. 96% SPO2 on 2L   Stairs             Wheelchair Mobility     Tilt Bed    Modified Rankin (Stroke Patients Only)       Balance Overall balance assessment: Needs assistance Sitting-balance support: No upper extremity supported, Feet supported Sitting balance-Leahy Scale: Good     Standing balance support: Bilateral upper extremity supported, During functional activity, Reliant on assistive device for balance Standing balance-Leahy Scale: Poor Standing balance comment: RW for gait                            Cognition Arousal: Alert Behavior During Therapy: Flat affect Overall Cognitive Status: Within Functional Limits for tasks assessed                                          Exercises      General Comments        Pertinent Vitals/Pain Pain Assessment Pain Score: 4  Pain Location: sacrum Pain Descriptors / Indicators: Sore Pain Intervention(s): Limited activity within patient's tolerance, Repositioned, Monitored  during session    Home Living                          Prior Function            PT Goals (current goals can now be found in the care plan section) Progress towards PT goals: Progressing toward goals    Frequency    Min 1X/week      PT Plan      Co-evaluation              AM-PAC PT "6 Clicks" Mobility   Outcome Measure  Help needed turning from your back to your side while in a flat bed without using bedrails?: A Little Help needed moving from lying on your back to sitting on the side of a flat bed without using bedrails?: A Little Help  needed moving to and from a bed to a chair (including a wheelchair)?: A Little Help needed standing up from a chair using your arms (e.g., wheelchair or bedside chair)?: A Little Help needed to walk in hospital room?: A Little Help needed climbing 3-5 steps with a railing? : Total 6 Click Score: 16    End of Session Equipment Utilized During Treatment: Gait belt Activity Tolerance: Patient tolerated treatment well Patient left: in chair;with call bell/phone within reach;with chair alarm set Nurse Communication: Mobility status PT Visit Diagnosis: Other abnormalities of gait and mobility (R26.89);History of falling (Z91.81)     Time: 6144-3154 PT Time Calculation (min) (ACUTE ONLY): 25 min  Charges:    $Gait Training: 8-22 mins $Therapeutic Activity: 8-22 mins PT General Charges $$ ACUTE PT VISIT: 1 Visit                     Merryl Hacker, PT Acute Rehabilitation Services Office: [phone]    Chelci Wintermute B Mitsy Owen 09/26/2023, 10:00 AM

## 2023-09-26 NOTE — Progress Notes (Signed)
Patient is back on the floor after procedure at 1750 PM. Alert and oriented. Will continue to monitor

## 2023-09-26 NOTE — Consult Note (Signed)
Patient Status: [facility] - In-pt  Assessment and Plan: Patient in need of venous access for initiation of dialysis.  Patient admitted with sepsis s/p PEA arrest.  Now with ongoing AKI and slight increase in SCr this AM.  Tunneled dialysis catheter requested.  Patient still recovering from acute issues.  WBC remains elevated at 23, although afebrile.  Case reviewed by Dr. Loreta Ave.  Plan made to proceed with temporary dialysis catheter placement.  Patient in agreement.   Risks and benefits discussed with the patient including, but not limited to bleeding, infection, vascular injury, pneumothorax which may require chest tube placement, air embolism or even death  All of the patient's questions were answered, patient is agreeable to proceed. Consent signed and in chart.  ______________________________________________________________________   History of Present Illness: Timothy Mata is a 89 y.o. male admitted with sepsis, s/p PEA arrest with ROSC after 10 min intervention.  Now with ongoing AKI in the setting of likely Stage III renal failure.  Elevated SCr this morning.  Tunneled HD catheter requested.  Allergies and medications reviewed.   Review of Systems: A 12 point ROS discussed and pertinent positives are indicated in the HPI above.  All other systems are negative.  Review of Systems  Constitutional:  Negative for fatigue and fever.  Respiratory:  Negative for cough and shortness of breath.   Cardiovascular:  Negative for chest pain.  Gastrointestinal:  Negative for abdominal pain, nausea and vomiting.  Musculoskeletal:  Negative for back pain.  Psychiatric/Behavioral:  Negative for behavioral problems and confusion.     Vital Signs: BP (!) 118/52 (BP Location: Left Wrist)   Pulse 89   Temp 97.7 F (36.5 C) (Oral)   Resp 20   Ht 5\' 10"  (1.778 m)   Wt 251 lb 8.7 oz (114.1 kg)   SpO2 99%   BMI 36.09 kg/m   Physical Exam Vitals and nursing note reviewed.   Constitutional:      General: He is not in acute distress.    Appearance: He is well-developed. He is not ill-appearing.  HENT:     Nose:     Comments: Bleeding from mouth and nose.  Cardiovascular:     Rate and Rhythm: Normal rate and regular rhythm.  Pulmonary:     Effort: Pulmonary effort is normal.     Breath sounds: Normal breath sounds.  Genitourinary:    Comments: Foley catheter in place Skin:    General: Skin is warm and dry.  Neurological:     General: No focal deficit present.     Mental Status: He is alert and oriented to person, place, and time.  Psychiatric:        Mood and Affect: Mood normal.        Behavior: Behavior normal.      Imaging reviewed.   Labs:  COAGS: Recent Labs    09/21/23 1640 09/22/23 0517  INR 1.7*  --   APTT 31 64*    BMP: Recent Labs    09/23/23 0410 09/24/23 0406 09/25/23 0336 09/26/23 0418  NA 133* 135 136 135  K 4.2 4.0 4.4 4.3  CL 100 104 106 105  CO2 18* 18* 16* 16*  GLUCOSE 101* 108* 93 94  BUN 60* 72* 82* 95*  CALCIUM 7.3* 7.7* 7.8* 7.8*  CREATININE 4.63* 6.05* 7.36* 8.50*  GFRNONAA 12* 8* 7* 6*       Electronically Signed: Hoyt Koch, PA 09/26/2023, 4:11 PM   I spent a total of  15 minutes in face to face in clinical consultation, greater than 50% of which was counseling/coordinating care for venous access.

## 2023-09-26 NOTE — Progress Notes (Signed)
Progress Note    Timothy Mata   ZOX:[id number]  DOB: January 09, 1933  DOA: 08/31/2023     6 PCP: Royann Shivers, PA-C  Initial CC: initially abd pain  Hospital Course: Timothy Mata is an 89 yo male with PMH aflutter on Eliquis, COPD, HFrEF, HTN, HLD who initially presented to [facility] on 09/19/2023.  Has had increased urinary frequency. CXR with mild pulm edema. CT ABD/pelvis no acute abnormality; mild left hydroureteronephrosis; small bilateral pleural effusions. WBC/LA elevated. UA with nitrite and large leukocytes. Cultures obtained and given IV fluids. Patient started on Rocephin.  Due to worsening hypotension, he was transferred to the ICU.  He suffered PEA arrest with ROSC after 10 minutes.  He was then transferred to [facility]. He was able to be extubated on 09/22/2023.  He developed decreased UOP and ATN after his cardiac event.   Interval History:  No events overnight.  Still having minimal urine output and labs continue to worsen.  Dialysis now recommended by nephrology. IR has been consulted for access.  Assessment and Plan: * Septic shock (HCC)-resolved as of 09/24/2023 - due to UTI on admission - Ucx indeterminate; completing empiric course   Pleural effusion on left - mod to large left effusion on CXR 9/25. In setting of ATN/volume overload would expect bilateral. With recent CPR does have risk for trauma/hemothorax notably with Hgb drop (which still could be multifactorial) - s/p thoracentesis on 9/26; noted to be bloody fluid and is exudative by criteria so likely this was hemothorax  Acute renal failure superimposed on stage 3b chronic kidney disease (HCC) - patient has history of CKD3b. Baseline creat ~ 1.9 - 2, eGFR~ 33-40 - at baseline on admission but then s/p cardiac arrest likely has developed ATN from hypoperfusion - continue trending labs; CO2 starting to drop further and labs overall worse - nephrology continuing to follow - has been recommended for  initiation of HD per nephrology - will hold Eliquis for now until HD access obtained; IR has been consulted    Cardiac arrest (HCC)-resolved as of 09/24/2023 - decompensated in setting of septic shock from UTI; found in PEA arrest with 10 minutes to ROSC - echo obtained 9/23: EF 60-65%, no RWMA, indeterminate diastology; small circumferential pericardial effusion  Acute respiratory failure with hypoxia (HCC) - in setting of cardiac arrest; extubated 9/23 - O2 requirement uptrended; now improved s/p throacentesis on 9/26 - continue weaning as able   PAF (paroxysmal atrial fibrillation) (HCC) - Continue Eliquis (on hold til HD access in place) and aspirin -Continue amiodarone  Normocytic anemia - Prior baseline appears to be around 11 to 12 g/dL -Some downtrend expected in setting of volume resuscitation and also worsened renal function plus presumed hemothorax  Asymptomatic carotid artery stenosis without infarction, right - s/p right endarterectomy with vascular surgery on 11/25/22 -On aspirin and Eliquis -Continue Lipitor  COPD (chronic obstructive pulmonary disease) (HCC) - no s/s exac  HFrEF (heart failure with reduced ejection fraction) (HCC) - EF 60-65% no RWMA; indeterminate diastology - prior hx of EF 45% in June 2023 per notes   Hyperlipidemia - Continue Lipitor  Benign prostatic hyperplasia - Continue Flomax  Essential hypertension - Avoiding hypotension but will start PRN's in case of persistently elevated blood pressure   Old records reviewed in assessment of this patient  Antimicrobials: Rocephin 09/13/2023 >> 09/26/2023  DVT prophylaxis:  Eliquis on hold   Code Status:   Code Status: Full Code  Mobility Assessment (Last 72 Hours)  Mobility Assessment     Row Name 09/26/23 1312 09/26/23 0959 09/26/23 0830 09/25/23 1948 09/25/23 1047   Does patient have an order for bedrest or is patient medically unstable -- -- No - Continue assessment No -  Continue assessment --   What is the highest level of mobility based on the progressive mobility assessment? Level 3 (Stands with assist) - Balance while standing  and cannot march in place Level 4 (Walks with assist in room) - Balance while marching in place and cannot step forward and back - Complete Level 5 (Walks with assist in room/hall) - Balance while stepping forward/back and can walk in room with assist - Complete Level 5 (Walks with assist in room/hall) - Balance while stepping forward/back and can walk in room with assist - Complete Level 5 (Walks with assist in room/hall) - Balance while stepping forward/back and can walk in room with assist - Complete   Is the above level different from baseline mobility prior to current illness? -- -- Yes - Recommend PT order Yes - Recommend PT order --    Row Name 09/25/23 0921 09/25/23 0000 09/24/23 0735 09/23/23 2115     Does patient have an order for bedrest or is patient medically unstable No - Continue assessment No - Continue assessment No - Continue assessment No - Continue assessment    What is the highest level of mobility based on the progressive mobility assessment? Level 5 (Walks with assist in room/hall) - Balance while stepping forward/back and can walk in room with assist - Complete Level 5 (Walks with assist in room/hall) - Balance while stepping forward/back and can walk in room with assist - Complete Level 5 (Walks with assist in room/hall) - Balance while stepping forward/back and can walk in room with assist - Complete Level 5 (Walks with assist in room/hall) - Balance while stepping forward/back and can walk in room with assist - Complete    Is the above level different from baseline mobility prior to current illness? Yes - Recommend PT order Yes - Recommend PT order Yes - Recommend PT order --             Barriers to discharge: none Disposition Plan:  Home Status is: Inpt  Objective: Blood pressure 101/71, pulse 95,  temperature 97.7 F (36.5 C), temperature source Oral, resp. rate 17, height 5\' 10"  (1.778 m), weight 114.1 kg, SpO2 97%.  Examination:  Physical Exam Constitutional:      General: He is not in acute distress.    Appearance: Normal appearance.  HENT:     Head: Normocephalic and atraumatic.     Mouth/Throat:     Mouth: Mucous membranes are moist.  Eyes:     Extraocular Movements: Extraocular movements intact.  Cardiovascular:     Rate and Rhythm: Normal rate and regular rhythm.  Pulmonary:     Effort: Pulmonary effort is normal. No respiratory distress.     Breath sounds: Normal breath sounds. No wheezing.  Abdominal:     General: Bowel sounds are normal. There is no distension.     Palpations: Abdomen is soft.     Tenderness: There is no abdominal tenderness.  Musculoskeletal:        General: Normal range of motion.     Cervical back: Normal range of motion and neck supple.     Right lower leg: Edema (2+ pitting edema) present.     Left lower leg: Edema (2+ pitting edema) present.     Comments:  Some swelling now being appreciated in arms  Skin:    General: Skin is warm and dry.  Neurological:     General: No focal deficit present.     Mental Status: He is alert.  Psychiatric:        Mood and Affect: Mood normal.        Behavior: Behavior normal.      Consultants:  Nephrology   Procedures:    Data Reviewed: Results for orders placed or performed during the hospital encounter of 09/14/2023 (from the past 24 hour(s))  Lactate dehydrogenase (pleural or peritoneal fluid)     Status: Abnormal   Collection Time: 09/25/23  4:18 PM  Result Value Ref Range   LD, Fluid 601 (H) 3 - 23 U/L   Fluid Type-FLDH PLEU LEFT   Body fluid cell count with differential     Status: Abnormal   Collection Time: 09/25/23  4:18 PM  Result Value Ref Range   Fluid Type-FCT PLEU LEFT    Color, Fluid RED (A) YELLOW   Appearance, Fluid BLOODY (A) CLEAR   Total Nucleated Cell Count, Fluid 3,300  (H) 0 - 1,000 cu mm   Neutrophil Count, Fluid 80 (H) 0 - 25 %   Lymphs, Fluid 18 %   Monocyte-Macrophage-Serous Fluid 2 (L) 50 - 90 %   Eos, Fluid 0 %   Other Cells, Fluid OTHER CELLS IDENTIFIED AS MESOTHELIAL CELLS %  Protein, pleural or peritoneal fluid     Status: None   Collection Time: 09/25/23  4:18 PM  Result Value Ref Range   Total protein, fluid <3.0 g/dL   Fluid Type-FTP PLEU LEFT   Glucose, capillary     Status: None   Collection Time: 09/25/23  8:39 PM  Result Value Ref Range   Glucose-Capillary 94 70 - 99 mg/dL  CBC     Status: Abnormal   Collection Time: 09/26/23  4:18 AM  Result Value Ref Range   WBC 23.7 (H) 4.0 - 10.5 K/uL   RBC 3.28 (L) 4.22 - 5.81 MIL/uL   Hemoglobin 9.6 (L) 13.0 - 17.0 g/dL   HCT 65.7 (L) 84.6 - 96.2 %   MCV 93.0 80.0 - 100.0 fL   MCH 29.3 26.0 - 34.0 pg   MCHC 31.5 30.0 - 36.0 g/dL   RDW 95.2 (H) 84.1 - 32.4 %   Platelets 402 (H) 150 - 400 K/uL   nRBC 0.1 0.0 - 0.2 %  Renal function panel     Status: Abnormal   Collection Time: 09/26/23  4:18 AM  Result Value Ref Range   Sodium 135 135 - 145 mmol/L   Potassium 4.3 3.5 - 5.1 mmol/L   Chloride 105 98 - 111 mmol/L   CO2 16 (L) 22 - 32 mmol/L   Glucose, Bld 94 70 - 99 mg/dL   BUN 95 (H) 8 - 23 mg/dL   Creatinine, Ser 4.01 (H) 0.61 - 1.24 mg/dL   Calcium 7.8 (L) 8.9 - 10.3 mg/dL   Phosphorus 9.7 (H) 2.5 - 4.6 mg/dL   Albumin 1.6 (L) 3.5 - 5.0 g/dL   GFR, Estimated 6 (L) >60 mL/min   Anion gap 14 5 - 15  Magnesium     Status: Abnormal   Collection Time: 09/26/23  4:18 AM  Result Value Ref Range   Magnesium 3.0 (H) 1.7 - 2.4 mg/dL  Lactate dehydrogenase     Status: Abnormal   Collection Time: 09/26/23 11:27 AM  Result Value Ref Range  LDH 273 (H) 98 - 192 U/L    I have reviewed pertinent nursing notes, vitals, labs, and images as necessary. I have ordered labwork to follow up on as indicated.  I have reviewed the last notes from staff over past 24 hours. I have discussed patient's  care plan and test results with nursing staff, CM/SW, and other staff as appropriate.  Time spent: Greater than 50% of the 55 minute visit was spent in counseling/coordination of care for the patient as laid out in the A&P.   LOS: 6 days   Lewie Chamber, MD [facility] 09/26/2023, 3:07 PM

## 2023-09-26 NOTE — Progress Notes (Signed)
Patient was dribbling for urine. (Blood stained) No urine was draining from foley catheter. Bladder scan done and it was . Made the provider aware via secure chat and irrigated the foley cath. No resistance. Removed foley catheter. Inserted a new foley catheter. Dark red blood  draining started. Photo sent to the provider via secure chat. Did empty the foley bag and it was 650 ml. Will continue to monitor.

## 2023-09-26 NOTE — Progress Notes (Addendum)
Patient ID: Timothy Mata, male   DOB: October 26, 1933, 89 y.o.   MRN: [id number] S: No new complaints.  Some poor appetite. Low UOP. Crt rising. Discussed need for HD cath today and then dialysis O:BP (!) 106/49 (BP Location: Left Wrist)   Pulse 92   Temp 98.4 F (36.9 C) (Oral)   Resp 17   Ht 5\' 10"  (1.778 m)   Wt 114.1 kg   SpO2 95%   BMI 36.09 kg/m   Intake/Output Summary (Last 24 hours) at 09/26/2023 1007 Last data filed at 09/26/2023 0556 Gross per 24 hour  Intake 490.53 ml  Output 150 ml  Net 340.53 ml   Intake/Output: I/O last 3 completed shifts: In: 490.5 [P.O.:270; IV Piggyback:220.5] Out: 150 [Urine:150]  Intake/Output this shift:  No intake/output data recorded. Weight change:  EXB:[id number] ill-appearing, lying in bed, mouth sores present UVO:ZDGUYQ rate, no rub Resp:bilateral chest rise, mild iwob Abd: mild distention, soft Ext: 1+ pretibial edema  Recent Labs  Lab 16-Oct-2023 1319 09/21/23 0449 09/21/23 1640 09/21/23 1952 09/22/23 0517 09/22/23 1412 09/22/23 2021 09/23/23 0410 09/24/23 0406 09/25/23 0336 09/26/23 0418  NA 137   < > 135   < > 135 140 136 133* 135 136 135  K 3.5   < > 3.9   < > 4.6 3.9 4.2 4.2 4.0 4.4 4.3  CL 101   < > 102  --  104 111 106 100 104 106 105  CO2 26   < > 20*  --  22 18* 18* 18* 18* 16* 16*  GLUCOSE 131*   < > 186*  --  109* 78 128* 101* 108* 93 94  BUN 39*   < > 36*  --  42* 41* 52* 60* 72* 82* 95*  CREATININE 2.26*   < > 2.30*  --  3.15* 3.16* 4.10* 4.63* 6.05* 7.36* 8.50*  ALBUMIN 2.7*  --  2.1*  --  1.6*  --   --   --  1.6* 1.8* 1.6*  CALCIUM 8.1*   < > 7.1*  --  7.2* 6.2* 7.3* 7.3* 7.7* 7.8* 7.8*  PHOS  --   --   --   --   --   --   --  5.5* 6.4* 7.8* 9.7*  AST 19  --  102*  --  108*  --   --   --  37  --   --   ALT 16  --  75*  --  88*  --   --   --  55*  --   --    < > = values in this interval not displayed.   Liver Function Tests: Recent Labs  Lab 09/21/23 1640 09/22/23 0517 09/24/23 0406 09/25/23 0336  09/26/23 0418  AST 102* 108* 37  --   --   ALT 75* 88* 55*  --   --   ALKPHOS 96 95 93  --   --   BILITOT 1.8* 1.3* 0.6  --   --   PROT 5.1* 4.5* 5.4*  --   --   ALBUMIN 2.1* 1.6* 1.6* 1.8* 1.6*   Recent Labs  Lab 16-Oct-2023 1319  LIPASE 27   No results for input(s): "AMMONIA" in the last 168 hours. CBC: Recent Labs  Lab 09/22/23 0517 09/23/23 0410 09/24/23 0406 09/25/23 0336 09/26/23 0418  WBC 39.6* 39.6* 33.0* 26.9* 23.7*  HGB 10.2* 9.7* 9.6* 9.8* 9.6*  HCT 32.8* 31.1* 30.3* 30.9* 30.5*  MCV 93.2 92.8 94.4 93.6  93.0  PLT 240 285 300 343 402*   Cardiac Enzymes: No results for input(s): "CKTOTAL", "CKMB", "CKMBINDEX", "TROPONINI" in the last 168 hours. CBG: Recent Labs  Lab 09/24/23 0743 09/24/23 1620 09/24/23 1934 09/25/23 0456 09/25/23 2039  GLUCAP 127* 98 98 86 94    Iron Studies: No results for input(s): "IRON", "TIBC", "TRANSFERRIN", "FERRITIN" in the last 72 hours. Studies/Results: DG Chest 1 View  Result Date: 09/25/2023 CLINICAL DATA:  Post left-sided thoracentesis EXAM: CHEST  1 VIEW COMPARISON:  Chest radiograph-09/24/2023; 09/21/2023. FINDINGS: Unchanged enlarged cardiac silhouette and mediastinal contours with atherosclerotic plaque within thoracic aorta. Stable positioning of support apparatus. Interval reduction in persistent trace left-sided pleural effusion post thoracentesis. No pneumothorax. Improved aeration of the left lung base with persistent bibasilar heterogeneous/consolidative opacities, left-greater-than-right. Unchanged trace right-sided pleural effusion. Mild pulmonary venous congestion without frank evidence of edema. No acute osseous abnormalities. Post right-sided ribs sideplate fixation, incompletely evaluated. IMPRESSION: 1. Interval reduction in persistent trace left-sided pleural effusion post thoracentesis without pneumothorax. 2. Improved aeration of the left lung base with persistent bibasilar heterogeneous/consolidative opacities,  left-greater-than-right, atelectasis versus infiltrate. 3. Similar findings of cardiomegaly and pulmonary venous congestion. Electronically Signed   By: Simonne Come M.D.   On: 09/25/2023 17:19   IR THORACENTESIS ASP PLEURAL SPACE W/IMG GUIDE  Result Date: 09/25/2023 INDICATION: Symptomatic left sided pleural effusion EXAM: IR THORACENTESIS ASP PLEURAL SPACE W/IMG GUIDE COMPARISON:  Chest radiograph-09/24/2023 MEDICATIONS: None. COMPLICATIONS: None immediate. TECHNIQUE: Informed written consent was obtained from the patient after a discussion of the risks, benefits and alternatives to treatment. A timeout was performed prior to the initiation of the procedure. Initial ultrasound scanning demonstrates a small to moderate-sized minimally complex left-sided pleural effusion. The lower chest was prepped and draped in the usual sterile fashion. 1% lidocaine was used for local anesthesia. Under direct ultrasound guidance, an 8 Fr Safe-T-Centesis catheter was introduced. The thoracentesis was performed. The catheter was removed and a dressing was applied. The patient tolerated the procedure well without immediate post procedural complication. The patient was escorted to have an upright chest radiograph. FINDINGS: A total of approximately 950 cc of bloody fluid was removed. Requested samples were sent to the laboratory. IMPRESSION: Successful ultrasound-guided left sided thoracentesis yielding 950 cc of bloody fluid. Electronically Signed   By: Simonne Come M.D.   On: 09/25/2023 17:16   DG CHEST PORT 1 VIEW  Result Date: 09/24/2023 CLINICAL DATA:  Cough EXAM: PORTABLE CHEST 1 VIEW COMPARISON:  09/21/2023 FINDINGS: Endotracheal tube and nasogastric tube have been removed. Right arm PICC tip in the SVC above the right atrium. Enlarged cardiac silhouette as seen previously. Mild chronic pleural blunting on the right. Moderate to large left effusion with atelectasis of the left lower lung. IMPRESSION: 1. Endotracheal tube  and nasogastric tube removed. Right arm PICC tip in the SVC above the right atrium. 2. Moderate to large left effusion with atelectasis of the left lower lung. Electronically Signed   By: Paulina Fusi M.D.   On: 09/24/2023 16:22    acyclovir ointment   Topical Q3H   amiodarone  200 mg Per Tube Daily   apixaban  2.5 mg Oral BID   aspirin  81 mg Oral Daily   atorvastatin  80 mg Oral q1800   Chlorhexidine Gluconate Cloth  6 each Topical Daily   fluticasone furoate-vilanterol  1 puff Inhalation Daily   furosemide  80 mg Intravenous Daily   levothyroxine  75 mcg Oral Q0600   mouth rinse  15 mL Mouth Rinse 4 times per day   polyethylene glycol  17 g Oral Daily   sevelamer carbonate  800 mg Oral TID WC   tamsulosin  0.4 mg Oral QPC supper    BMET    Component Value Date/Time   NA 135 09/26/2023 0418   K 4.3 09/26/2023 0418   CL 105 09/26/2023 0418   CO2 16 (L) 09/26/2023 0418   GLUCOSE 94 09/26/2023 0418   BUN 95 (H) 09/26/2023 0418   CREATININE 8.50 (H) 09/26/2023 0418   CALCIUM 7.8 (L) 09/26/2023 0418   GFRNONAA 6 (L) 09/26/2023 0418   GFRAA 47 (L) 07/18/2015 0939   CBC    Component Value Date/Time   WBC 23.7 (H) 09/26/2023 0418   RBC 3.28 (L) 09/26/2023 0418   HGB 9.6 (L) 09/26/2023 0418   HCT 30.5 (L) 09/26/2023 0418   PLT 402 (H) 09/26/2023 0418   MCV 93.0 09/26/2023 0418   MCH 29.3 09/26/2023 0418   MCHC 31.5 09/26/2023 0418   RDW 15.7 (H) 09/26/2023 0418    Assessment/Plan:  AKI/CKD Stage IIIb, oliguric - presumably ischemic ATN in setting of PEA arrest.  Blood pressures have improved.  Persistently worsening kidney function with low urine output.  No signs of recovery at this point.  Worsening acidosis this.  I think it is best to proceed with starting dialysis.  Will consult IR for [facility] today given expected prolonged recovery; temporary HD catheter if unable to do [facility].  Plan for dialysis today and likely again tomorrow. May start AKI CLIP next week pending coarse  Avoid  nephrotoxic medications including NSAIDs and iodinated intravenous contrast exposure unless the latter is absolutely indicated.   Preferred narcotic agents for pain control are hydromorphone, fentanyl, and methadone. Morphine should not be used.  Avoid Baclofen and avoid oral sodium phosphate and magnesium citrate based laxatives / bowel preps.  Continue strict Input and Output monitoring. Will monitor the patient closely with you and intervene or adjust therapy as indicated by changes in clinical status/labs  PEA arrest and septic shock - off pressors, abx per primary Acute respiratory failure post arrest with underlying COPD - weaned to room air.  Per PCCM Afib/flutter - on eliquis and amiodarone. Normocytic anemia - likely combination of AKI/CKD and anemia of critical illness.  Transfuse for Hgb <7. HFrEF - EF 60% and RV function low normal.  Worsening edema, poor response to lasix. HD as above Hyperphosphatemia: Start sevelamer

## 2023-09-26 NOTE — Progress Notes (Signed)
Patient was taken for IR procedure via bed at 1635M. On 2L nasal canula.

## 2023-09-26 NOTE — Progress Notes (Signed)
Occupational Therapy Treatment Patient Details Name: Timothy Mata MRN: [id number] DOB: 03/14/1933 Today's Date: 09/26/2023   History of present illness 89 y.o. male admitted at [facility] 9/21 with abdominal pain, UTI, sepsis.  9/22 cardiac arrest,  PEA 8-10 min with transfer to [facility]. Intubation 9/22-9/23. PMHx: Aflutter on Eliquis and amiodarone, BPH, COPD, HTN, gout   OT comments  Patient received seated up in recliner with complaints of fatigue due to lack of sleep and recently completed PT. Patient agreeable to OT session but declined standing at sink for grooming due to fatigue. Patient performed grooming and UE HEP before asking to return to supine. Patient to continue to be followed by Acute OT to address self care and functional transfers.       If plan is discharge home, recommend the following:  A little help with walking and/or transfers;A little help with bathing/dressing/bathroom;Assistance with cooking/housework;Direct supervision/assist for medications management;Direct supervision/assist for financial management   Equipment Recommendations  None recommended by OT    Recommendations for Other Services      Precautions / Restrictions Precautions Precautions: Fall;Other (comment) Precaution Comments: watch sats, incontinent stool Restrictions Weight Bearing Restrictions: No       Mobility Bed Mobility Overal bed mobility: Modified Independent Bed Mobility: Sit to Supine       Sit to supine: Supervision   General bed mobility comments: supervision to return to supine due to fatigue    Transfers Overall transfer level: Needs assistance Equipment used: Rolling walker (2 wheels) Transfers: Sit to/from Stand, Bed to chair/wheelchair/BSC Sit to Stand: Contact guard assist           General transfer comment: CGA to stand from recliner and transfer to EOB     Balance Overall balance assessment: Needs assistance Sitting-balance support: No upper extremity supported,  Feet supported Sitting balance-Leahy Scale: Good     Standing balance support: Bilateral upper extremity supported, During functional activity, Reliant on assistive device for balance Standing balance-Leahy Scale: Poor Standing balance comment: RW for balance                           ADL either performed or assessed with clinical judgement   ADL Overall ADL's : Needs assistance/impaired     Grooming: Set up;Sitting                   Toilet Transfer: Contact guard assist;Ambulation;Rolling walker (2 wheels) Toilet Transfer Details (indicate cue type and reason): simulated                Extremity/Trunk Assessment              Vision       Perception     Praxis      Cognition Arousal: Alert Behavior During Therapy: Flat affect Overall Cognitive Status: Within Functional Limits for tasks assessed                                 General Comments: fatigued from lack of sleep and recently had PT        Exercises Exercises: General Upper Extremity General Exercises - Upper Extremity Shoulder Flexion: Strengthening, Both, 5 reps, Seated, Theraband Theraband Level (Shoulder Flexion): Level 2 (Red) Shoulder ABduction: Strengthening, Both, 10 reps, Seated, Theraband Theraband Level (Shoulder Abduction): Level 2 (Red) Elbow Flexion: Strengthening, Both, 10 reps, Seated, Theraband Theraband Level (Elbow Flexion): Level 2 (Red)  Shoulder Instructions       General Comments SpO2 93% on 1 liter    Pertinent Vitals/ Pain       Pain Assessment Pain Assessment: Faces Pain Score: 4  Pain Location: sacrum Pain Descriptors / Indicators: Sore Pain Intervention(s): Limited activity within patient's tolerance, Monitored during session, Repositioned  Home Living                                          Prior Functioning/Environment              Frequency  Min 1X/week        Progress Toward  Goals  OT Goals(current goals can now be found in the care plan section)  Progress towards OT goals: Progressing toward goals  Acute Rehab OT Goals Patient Stated Goal: get better OT Goal Formulation: With patient Time For Goal Achievement: 10/07/23 Potential to Achieve Goals: Good ADL Goals Additional ADL Goal #1: pt will complete BADLs with mod I Additional ADL Goal #2: Pt will tolerate at least 10 minutes of OOB activity to demonstrate increased endurance for ADLs at home  Plan      Co-evaluation                 AM-PAC OT "6 Clicks" Daily Activity     Outcome Measure   Help from another person eating meals?: None Help from another person taking care of personal grooming?: A Little Help from another person toileting, which includes using toliet, bedpan, or urinal?: A Little Help from another person bathing (including washing, rinsing, drying)?: A Little Help from another person to put on and taking off regular upper body clothing?: A Little Help from another person to put on and taking off regular lower body clothing?: A Little 6 Click Score: 19    End of Session Equipment Utilized During Treatment: Rolling walker (2 wheels);Oxygen  OT Visit Diagnosis: Unsteadiness on feet (R26.81);Other abnormalities of gait and mobility (R26.89);Muscle weakness (generalized) (M62.81)   Activity Tolerance Patient limited by fatigue   Patient Left in bed;with call bell/phone within reach;with bed alarm set   Nurse Communication Mobility status        Time: 5621-3086 OT Time Calculation (min): 17 min  Charges: OT General Charges $OT Visit: 1 Visit OT Treatments $Therapeutic Exercise: 8-22 mins  Alfonse Flavors, OTA Acute Rehabilitation Services  Office [phone]   Dewain Penning 09/26/2023, 1:29 PM

## 2023-09-27 DIAGNOSIS — R319 Hematuria, unspecified: Secondary | ICD-10-CM | POA: Diagnosis not present

## 2023-09-27 DIAGNOSIS — A419 Sepsis, unspecified organism: Secondary | ICD-10-CM | POA: Diagnosis not present

## 2023-09-27 DIAGNOSIS — R6521 Severe sepsis with septic shock: Secondary | ICD-10-CM | POA: Diagnosis not present

## 2023-09-27 DIAGNOSIS — N170 Acute kidney failure with tubular necrosis: Secondary | ICD-10-CM | POA: Diagnosis not present

## 2023-09-27 LAB — RENAL FUNCTION PANEL
Albumin: 1.6 g/dL — ABNORMAL LOW (ref 3.5–5.0)
Anion gap: 19 — ABNORMAL HIGH (ref 5–15)
BUN: 81 mg/dL — ABNORMAL HIGH (ref 8–23)
CO2: 17 mmol/L — ABNORMAL LOW (ref 22–32)
Calcium: 7.8 mg/dL — ABNORMAL LOW (ref 8.9–10.3)
Chloride: 101 mmol/L (ref 98–111)
Creatinine, Ser: 7.24 mg/dL — ABNORMAL HIGH (ref 0.61–1.24)
GFR, Estimated: 7 mL/min — ABNORMAL LOW (ref >60)
Glucose, Bld: 90 mg/dL (ref 70–99)
Phosphorus: 8.4 mg/dL — ABNORMAL HIGH (ref 2.5–4.6)
Potassium: 4.1 mmol/L (ref 3.5–5.1)
Sodium: 137 mmol/L (ref 135–145)

## 2023-09-27 LAB — CBC WITH DIFFERENTIAL/PLATELET
Abs Immature Granulocytes: 0.85 10*3/uL — ABNORMAL HIGH (ref 0.00–0.07)
Basophils Absolute: 0.1 10*3/uL (ref 0.0–0.1)
Basophils Relative: 0 %
Eosinophils Absolute: 0.2 10*3/uL (ref 0.0–0.5)
Eosinophils Relative: 1 %
HCT: 28.6 % — ABNORMAL LOW (ref 39.0–52.0)
Hemoglobin: 9.2 g/dL — ABNORMAL LOW (ref 13.0–17.0)
Immature Granulocytes: 4 %
Lymphocytes Relative: 4 %
Lymphs Abs: 0.7 10*3/uL (ref 0.7–4.0)
MCH: 29 pg (ref 26.0–34.0)
MCHC: 32.2 g/dL (ref 30.0–36.0)
MCV: 90.2 fL (ref 80.0–100.0)
Monocytes Absolute: 2.2 10*3/uL — ABNORMAL HIGH (ref 0.1–1.0)
Monocytes Relative: 11 %
Neutro Abs: 15.7 10*3/uL — ABNORMAL HIGH (ref 1.7–7.7)
Neutrophils Relative %: 80 %
Platelets: 468 10*3/uL — ABNORMAL HIGH (ref 150–400)
RBC: 3.17 MIL/uL — ABNORMAL LOW (ref 4.22–5.81)
RDW: 15.7 % — ABNORMAL HIGH (ref 11.5–15.5)
WBC: 19.7 10*3/uL — ABNORMAL HIGH (ref 4.0–10.5)
nRBC: 0.2 % (ref 0.0–0.2)

## 2023-09-27 LAB — GLUCOSE, CAPILLARY
Glucose-Capillary: 105 mg/dL — ABNORMAL HIGH (ref 70–99)
Glucose-Capillary: 86 mg/dL (ref 70–99)
Glucose-Capillary: 130 mg/dL — ABNORMAL HIGH (ref 70–99)
Glucose-Capillary: 150 mg/dL — ABNORMAL HIGH (ref 70–99)

## 2023-09-27 LAB — HEPATITIS B SURFACE ANTIBODY, QUANTITATIVE: Hep B S AB Quant (Post): <3.5 m[IU]/mL — ABNORMAL LOW

## 2023-09-27 LAB — MAGNESIUM: Magnesium: 2.6 mg/dL — ABNORMAL HIGH (ref 1.7–2.4)

## 2023-09-27 MED ORDER — ALBUMIN HUMAN 25 % IV SOLN
25.0000 g | Freq: Four times a day (QID) | INTRAVENOUS | Status: AC
  Administered 2023-09-27 (×2): 25 g via INTRAVENOUS
  Filled 2023-09-27 (×2): qty 100

## 2023-09-27 MED ORDER — SODIUM BICARBONATE 650 MG PO TABS
650.0000 mg | ORAL_TABLET | Freq: Two times a day (BID) | ORAL | Status: DC
  Administered 2023-09-27 – 2023-09-28 (×3): 650 mg via ORAL
  Filled 2023-09-27 (×3): qty 1

## 2023-09-27 NOTE — Progress Notes (Signed)
Patient ID: Timothy Mata, male   DOB: [birth year], 89 y.o.   MRN: [id number] S: Patient states he feels okay with no complaints today.  Bladder scan did reveal about 500 cc of urine in the bladder yesterday and Foley placed.  About 1 L of urine output which is overall improved.  Had 2 hours of dialysis yesterday.  Awaiting labs today.  Appreciate help from interventional radiology.  Placed temporary catheter. O:BP (!) 115/57 (BP Location: Left Arm)   Pulse 96   Temp (!) 97.5 F (36.4 C) (Oral)   Resp (!) 23   Ht 5\' 10"  (1.778 m)   Wt 112.2 kg   SpO2 97%   BMI 35.49 kg/m   Intake/Output Summary (Last 24 hours) at 09/27/2023 0939 Last data filed at 09/27/2023 0050 Gross per 24 hour  Intake 150 ml  Output 1050 ml  Net -900 ml   Intake/Output: I/O last 3 completed shifts: In: 530.5 [P.O.:310; IV Piggyback:220.5] Out: 1200 [Urine:800; Other:400]  Intake/Output this shift:  No intake/output data recorded. Weight change: 1.8 kg WUJ:[id number] ill-appearing, lying in bed, mouth sores present AOZ:HYQMVH rate, no rub Resp:bilateral chest rise, mild iwob Abd: mild distention, soft Ext: Trace edema in the lower extremities  Recent Labs  Lab 09/21/2023 1319 09/21/23 0449 09/21/23 1640 09/21/23 1952 09/22/23 0517 09/22/23 1412 09/22/23 2021 09/23/23 0410 09/24/23 0406 09/25/23 0336 09/26/23 0418  NA 137   < > 135   < > 135 140 136 133* 135 136 135  K 3.5   < > 3.9   < > 4.6 3.9 4.2 4.2 4.0 4.4 4.3  CL 101   < > 102  --  104 111 106 100 104 106 105  CO2 26   < > 20*  --  22 18* 18* 18* 18* 16* 16*  GLUCOSE 131*   < > 186*  --  109* 78 128* 101* 108* 93 94  BUN 39*   < > 36*  --  42* 41* 52* 60* 72* 82* 95*  CREATININE 2.26*   < > 2.30*  --  3.15* 3.16* 4.10* 4.63* 6.05* 7.36* 8.50*  ALBUMIN 2.7*  --  2.1*  --  1.6*  --   --   --  1.6* 1.8* 1.6*  CALCIUM 8.1*   < > 7.1*  --  7.2* 6.2* 7.3* 7.3* 7.7* 7.8* 7.8*  PHOS  --   --   --   --   --   --   --  5.5* 6.4* 7.8* 9.7*  AST 19  --   102*  --  108*  --   --   --  37  --   --   ALT 16  --  75*  --  88*  --   --   --  55*  --   --    < > = values in this interval not displayed.   Liver Function Tests: Recent Labs  Lab 09/21/23 1640 09/22/23 0517 09/24/23 0406 09/25/23 0336 09/26/23 0418  AST 102* 108* 37  --   --   ALT 75* 88* 55*  --   --   ALKPHOS 96 95 93  --   --   BILITOT 1.8* 1.3* 0.6  --   --   PROT 5.1* 4.5* 5.4*  --   --   ALBUMIN 2.1* 1.6* 1.6* 1.8* 1.6*   Recent Labs  Lab 2023/09/21 1319  LIPASE 27   No results for input(s): "AMMONIA" in the  last 168 hours. CBC: Recent Labs  Lab 09/23/23 0410 09/24/23 0406 09/25/23 0336 09/26/23 0418 09/27/23 0838  WBC 39.6* 33.0* 26.9* 23.7* 19.7*  NEUTROABS  --   --   --   --  15.7*  HGB 9.7* 9.6* 9.8* 9.6* 9.2*  HCT 31.1* 30.3* 30.9* 30.5* 28.6*  MCV 92.8 94.4 93.6 93.0 90.2  PLT 285 300 343 402* 468*   Cardiac Enzymes: No results for input(s): "CKTOTAL", "CKMB", "CKMBINDEX", "TROPONINI" in the last 168 hours. CBG: Recent Labs  Lab 09/24/23 1934 09/25/23 0456 09/25/23 2039 09/27/23 0438 09/27/23 0823  GLUCAP 98 86 94 105* 86    Iron Studies: No results for input(s): "IRON", "TIBC", "TRANSFERRIN", "FERRITIN" in the last 72 hours. Studies/Results: IR Fluoro Guide CV Line Right  Result Date: 09/26/2023 INDICATION: ESRD requiring HD initiation. EXAM: NON-TUNNELED CENTRAL VENOUS HEMODIALYSIS CATHETER PLACEMENT WITH ULTRASOUND AND FLUOROSCOPIC GUIDANCE COMPARISON:  Chest XR, 09/25/2023. MEDICATIONS: Local anesthetic was administered. FLUOROSCOPY TIME:  Fluoroscopic dose; 1 mGy COMPLICATIONS: None immediate. PROCEDURE: Informed written consent was obtained from the patient and/or patient's representative after a discussion of the risks, benefits, and alternatives to treatment. Questions regarding the procedure were encouraged and answered. The RIGHT neck and chest were prepped with chlorhexidine in a sterile fashion, and a sterile drape was applied  covering the operative field. Maximum barrier sterile technique with sterile gowns and gloves were used for the procedure. A timeout was performed prior to the initiation of the procedure. After the overlying soft tissues were anesthetized, a small venotomy incision was created and a micropuncture kit was utilized to access the internal jugular vein. Real-time ultrasound guidance was utilized for vascular access including the acquisition of a permanent ultrasound image documenting patency of the accessed vessel. The microwire was utilized to measure appropriate catheter length. A stiff glidewire was advanced to the level of the IVC. Under fluoroscopic guidance, the venotomy was serially dilated, ultimately allowing placement of a 20 cm temporary Trialysis catheter with tip ultimately terminating within the superior aspect of the right atrium. Final catheter positioning was confirmed and documented with a spot radiographic image. The catheter aspirates and flushes normally. The catheter was flushed with appropriate volume heparin dwells. The catheter exit site was secured with a 2-0 Ethilon retention suture. A dressing was placed. The patient tolerated the procedure well without immediate post procedural complication. IMPRESSION: Successful placement of a RIGHT internal jugular approach 20 cm temporary dialysis catheter The tip of the catheter is positioned within the proximal RIGHT atrium. The catheter is ready for immediate use. PLAN: This catheter may be converted to a tunneled dialysis catheter at a later date as indicated. Roanna Banning, MD [facility] Radiology Electronically Signed   By: Roanna Banning M.D.   On: 09/26/2023 17:45   IR US Guide Vasc Access Right  Result Date: 09/26/2023 INDICATION: ESRD requiring HD initiation. EXAM: NON-TUNNELED CENTRAL VENOUS HEMODIALYSIS CATHETER PLACEMENT WITH ULTRASOUND AND FLUOROSCOPIC GUIDANCE COMPARISON:  Chest XR,  09/25/2023. MEDICATIONS: Local anesthetic was administered. FLUOROSCOPY TIME:  Fluoroscopic dose; 1 mGy COMPLICATIONS: None immediate. PROCEDURE: Informed written consent was obtained from the patient and/or patient's representative after a discussion of the risks, benefits, and alternatives to treatment. Questions regarding the procedure were encouraged and answered. The RIGHT neck and chest were prepped with chlorhexidine in a sterile fashion, and a sterile drape was applied covering the operative field. Maximum barrier sterile technique with sterile gowns and gloves were used for the procedure. A timeout was performed  prior to the initiation of the procedure. After the overlying soft tissues were anesthetized, a small venotomy incision was created and a micropuncture kit was utilized to access the internal jugular vein. Real-time ultrasound guidance was utilized for vascular access including the acquisition of a permanent ultrasound image documenting patency of the accessed vessel. The microwire was utilized to measure appropriate catheter length. A stiff glidewire was advanced to the level of the IVC. Under fluoroscopic guidance, the venotomy was serially dilated, ultimately allowing placement of a 20 cm temporary Trialysis catheter with tip ultimately terminating within the superior aspect of the right atrium. Final catheter positioning was confirmed and documented with a spot radiographic image. The catheter aspirates and flushes normally. The catheter was flushed with appropriate volume heparin dwells. The catheter exit site was secured with a 2-0 Ethilon retention suture. A dressing was placed. The patient tolerated the procedure well without immediate post procedural complication. IMPRESSION: Successful placement of a RIGHT internal jugular approach 20 cm temporary dialysis catheter The tip of the catheter is positioned within the proximal RIGHT atrium. The catheter is ready for immediate use. PLAN: This  catheter may be converted to a tunneled dialysis catheter at a later date as indicated. Roanna Banning, MD [facility] Radiology Electronically Signed   By: Roanna Banning M.D.   On: 09/26/2023 17:45   DG Chest 1 View  Result Date: 09/25/2023 CLINICAL DATA:  Post left-sided thoracentesis EXAM: CHEST  1 VIEW COMPARISON:  Chest radiograph-09/24/2023; 09/21/2023. FINDINGS: Unchanged enlarged cardiac silhouette and mediastinal contours with atherosclerotic plaque within thoracic aorta. Stable positioning of support apparatus. Interval reduction in persistent trace left-sided pleural effusion post thoracentesis. No pneumothorax. Improved aeration of the left lung base with persistent bibasilar heterogeneous/consolidative opacities, left-greater-than-right. Unchanged trace right-sided pleural effusion. Mild pulmonary venous congestion without frank evidence of edema. No acute osseous abnormalities. Post right-sided ribs sideplate fixation, incompletely evaluated. IMPRESSION: 1. Interval reduction in persistent trace left-sided pleural effusion post thoracentesis without pneumothorax. 2. Improved aeration of the left lung base with persistent bibasilar heterogeneous/consolidative opacities, left-greater-than-right, atelectasis versus infiltrate. 3. Similar findings of cardiomegaly and pulmonary venous congestion. Electronically Signed   By: Simonne Come M.D.   On: 09/25/2023 17:19   IR THORACENTESIS ASP PLEURAL SPACE W/IMG GUIDE  Result Date: 09/25/2023 INDICATION: Symptomatic left sided pleural effusion EXAM: IR THORACENTESIS ASP PLEURAL SPACE W/IMG GUIDE COMPARISON:  Chest radiograph-09/24/2023 MEDICATIONS: None. COMPLICATIONS: None immediate. TECHNIQUE: Informed written consent was obtained from the patient after a discussion of the risks, benefits and alternatives to treatment. A timeout was performed prior to the initiation of the procedure. Initial ultrasound  scanning demonstrates a small to moderate-sized minimally complex left-sided pleural effusion. The lower chest was prepped and draped in the usual sterile fashion. 1% lidocaine was used for local anesthesia. Under direct ultrasound guidance, an 8 Fr Safe-T-Centesis catheter was introduced. The thoracentesis was performed. The catheter was removed and a dressing was applied. The patient tolerated the procedure well without immediate post procedural complication. The patient was escorted to have an upright chest radiograph. FINDINGS: A total of approximately 950 cc of bloody fluid was removed. Requested samples were sent to the laboratory. IMPRESSION: Successful ultrasound-guided left sided thoracentesis yielding 950 cc of bloody fluid. Electronically Signed   By: Simonne Come M.D.   On: 09/25/2023 17:16    acyclovir ointment   Topical Q3H   amiodarone  200 mg Per Tube Daily   atorvastatin  80 mg Oral q1800  Chlorhexidine Gluconate Cloth  6 each Topical Daily   fluticasone furoate-vilanterol  1 puff Inhalation Daily   levothyroxine  75 mcg Oral Q0600   lidocaine  20 mL Infiltration Once   mouth rinse  15 mL Mouth Rinse 4 times per day   polyethylene glycol  17 g Oral Daily   sevelamer carbonate  800 mg Oral TID WC   tamsulosin  0.4 mg Oral QPC supper    BMET    Component Value Date/Time   NA 135 09/26/2023 0418   K 4.3 09/26/2023 0418   CL 105 09/26/2023 0418   CO2 16 (L) 09/26/2023 0418   GLUCOSE 94 09/26/2023 0418   BUN 95 (H) 09/26/2023 0418   CREATININE 8.50 (H) 09/26/2023 0418   CALCIUM 7.8 (L) 09/26/2023 0418   GFRNONAA 6 (L) 09/26/2023 0418   GFRAA 47 (L) 07/18/2015 0939   CBC    Component Value Date/Time   WBC 19.7 (H) 09/27/2023 0838   RBC 3.17 (L) 09/27/2023 0838   HGB 9.2 (L) 09/27/2023 0838   HCT 28.6 (L) 09/27/2023 0838   PLT 468 (H) 09/27/2023 0838   MCV 90.2 09/27/2023 0838   MCH 29.0 09/27/2023 0838   MCHC 32.2 09/27/2023 0838   RDW 15.7 (H) 09/27/2023 0838    LYMPHSABS 0.7 09/27/2023 0838   MONOABS 2.2 (H) 09/27/2023 0838   EOSABS 0.2 09/27/2023 0838   BASOSABS 0.1 09/27/2023 0838    Assessment/Plan:  AKI/CKD Stage IIIb, oliguric - presumably ischemic ATN in setting of PEA arrest.  Blood pressures have improved.  Persistently worsening kidney function with low urine output.  Temp cath placed by IR on 9/28 (given concern for infection). Had first HD on 9/28. Possibly had some obstructive component so foley placed 9/28. Urine is very dark possibly some blood/atn. Will await labs but likely hold HD this weekend and assess UOP and Crt trend. Will give 2 doses of albumin 25g given low serum albumin in the setting of AKI Avoid nephrotoxic medications including NSAIDs and iodinated intravenous contrast exposure unless the latter is absolutely indicated.   Preferred narcotic agents for pain control are hydromorphone, fentanyl, and methadone. Morphine should not be used.  Avoid Baclofen and avoid oral sodium phosphate and magnesium citrate based laxatives / bowel preps.  Continue strict Input and Output monitoring. Will monitor the patient closely with you and intervene or adjust therapy as indicated by changes in clinical status/labs  PEA arrest and septic shock - off pressors, abx per primary Acute respiratory failure post arrest with underlying COPD - weaned to room air.  Per PCCM Afib/flutter - on eliquis and amiodarone. Normocytic anemia - likely combination of AKI/CKD and anemia of critical illness.  Transfuse for Hgb <7. May start esa next week pending progress HFrEF - EF 60% and RV function low normal.  Worsening edema, poor response to lasix. HD as above Hyperphosphatemia: Started sevelamer. CTM

## 2023-09-27 NOTE — Progress Notes (Deleted)
Tried to page [place] PA regarding Pt post op bleeding. "Call cannot be completed as dialed" Nursing plan of care ongoing.

## 2023-09-27 NOTE — Progress Notes (Signed)
Progress Note    Timothy Mata   UVO:[id number]  DOB: [birth year]  DOA: 2023/10/17     7 PCP: Royann Shivers, PA-C  Initial CC: initially abd pain  Hospital Course: Mr. Clemmons is an 89 yo male with PMH aflutter on Eliquis, COPD, HFrEF, HTN, HLD who initially presented to [facility] on October 17, 2023.  Has had increased urinary frequency. CXR with mild pulm edema. CT ABD/pelvis no acute abnormality; mild left hydroureteronephrosis; small bilateral pleural effusions. WBC/LA elevated. UA with nitrite and large leukocytes. Cultures obtained and given IV fluids. Patient started on Rocephin.  Due to worsening hypotension, he was transferred to the ICU.  He suffered PEA arrest with ROSC after 10 minutes.  He was then transferred to [facility]. He was able to be extubated on 09/22/2023.  He developed decreased UOP and ATN after his cardiac event.   Interval History:  No events overnight.  Still ongoing hematuria.  Tolerated dialysis yesterday.  No concerns today.  Assessment and Plan: * Septic shock (HCC)-resolved as of 09/24/2023 - due to UTI on admission - Ucx indeterminate; completing empiric course   Hematuria - Currently no clots.  Foley catheter exchanged on 09/26/2023 - suspect some trauma in setting of foley and anticoagulation - Continue catheter flushes scheduled -Continue holding Eliquis and aspirin  Pleural effusion on left - mod to large left effusion on CXR 9/25. In setting of ATN/volume overload would expect bilateral. With recent CPR does have risk for trauma/hemothorax notably with Hgb drop (which still could be multifactorial) - s/p thoracentesis on 9/26; noted to be bloody fluid and is exudative by criteria so likely this was hemothorax  Acute renal failure superimposed on stage 3b chronic kidney disease (HCC) - patient has history of CKD3b. Baseline creat ~ 1.9 - 2, eGFR~ 33-40 - at baseline on admission but then s/p cardiac arrest likely has developed ATN from  hypoperfusion - continue trending labs; CO2 starting to drop further and labs overall worse - nephrology continuing to follow - has been recommended for initiation of HD per nephrology - RIJ placed by IR on 9/27 for HD access   Cardiac arrest (HCC)-resolved as of 09/24/2023 - decompensated in setting of septic shock from UTI; found in PEA arrest with 10 minutes to ROSC - echo obtained 9/23: EF 60-65%, no RWMA, indeterminate diastology; small circumferential pericardial effusion  Acute respiratory failure with hypoxia (HCC) - in setting of cardiac arrest; extubated 9/23 - O2 requirement uptrended; now improved s/p throacentesis on 9/26 - continue weaning as able   PAF (paroxysmal atrial fibrillation) (HCC) - Holding Eliquis and aspirin until hematuria resolves -Continue amiodarone  Normocytic anemia - Prior baseline appears to be around 11 to 12 g/dL -Some downtrend expected in setting of volume resuscitation and also worsened renal function plus presumed hemothorax  Asymptomatic carotid artery stenosis without infarction, right - s/p right endarterectomy with vascular surgery on 11/25/22 -On aspirin and Eliquis at home (see hematuria) -Continue Lipitor  COPD (chronic obstructive pulmonary disease) (HCC) - no s/s exac  HFrEF (heart failure with reduced ejection fraction) (HCC) - EF 60-65% no RWMA; indeterminate diastology - prior hx of EF 45% in June 2023 per notes   Hyperlipidemia - Continue Lipitor  Benign prostatic hyperplasia - Continue Flomax  Essential hypertension - Avoiding hypotension but will start PRN's in case of persistently elevated blood pressure   Old records reviewed in assessment of this patient  Antimicrobials: Rocephin 10-17-23 >> 09/26/2023  DVT prophylaxis:  Eliquis on hold  Code Status:   Code Status: Full Code  Mobility Assessment (Last 72 Hours)     Mobility Assessment     Row Name 09/27/23 0845 09/26/23 2000 09/26/23 1312 09/26/23  0959 09/26/23 0830   Does patient have an order for bedrest or is patient medically unstable No - Continue assessment No - Continue assessment -- -- No - Continue assessment   What is the highest level of mobility based on the progressive mobility assessment? Level 3 (Stands with assist) - Balance while standing  and cannot march in place Level 5 (Walks with assist in room/hall) - Balance while stepping forward/back and can walk in room with assist - Complete Level 3 (Stands with assist) - Balance while standing  and cannot march in place Level 4 (Walks with assist in room) - Balance while marching in place and cannot step forward and back - Complete Level 5 (Walks with assist in room/hall) - Balance while stepping forward/back and can walk in room with assist - Complete   Is the above level different from baseline mobility prior to current illness? Yes - Recommend PT order Yes - Recommend PT order -- -- Yes - Recommend PT order    Row Name 09/25/23 1948 09/25/23 1047 09/25/23 0921 09/25/23 0000     Does patient have an order for bedrest or is patient medically unstable No - Continue assessment -- No - Continue assessment No - Continue assessment    What is the highest level of mobility based on the progressive mobility assessment? Level 5 (Walks with assist in room/hall) - Balance while stepping forward/back and can walk in room with assist - Complete Level 5 (Walks with assist in room/hall) - Balance while stepping forward/back and can walk in room with assist - Complete Level 5 (Walks with assist in room/hall) - Balance while stepping forward/back and can walk in room with assist - Complete Level 5 (Walks with assist in room/hall) - Balance while stepping forward/back and can walk in room with assist - Complete    Is the above level different from baseline mobility prior to current illness? Yes - Recommend PT order -- Yes - Recommend PT order Yes - Recommend PT order             Barriers to  discharge: none Disposition Plan:  Home Status is: Inpt  Objective: Blood pressure 107/65, pulse 99, temperature 97.8 F (36.6 C), temperature source Axillary, resp. rate (!) 21, height 5\' 10"  (1.778 m), weight 112.2 kg, SpO2 98%.  Examination:  Physical Exam Constitutional:      General: He is not in acute distress.    Appearance: Normal appearance.  HENT:     Head: Normocephalic and atraumatic.     Mouth/Throat:     Mouth: Mucous membranes are moist.  Eyes:     Extraocular Movements: Extraocular movements intact.  Cardiovascular:     Rate and Rhythm: Normal rate and regular rhythm.  Pulmonary:     Effort: Pulmonary effort is normal. No respiratory distress.     Breath sounds: Normal breath sounds. No wheezing.  Abdominal:     General: Bowel sounds are normal. There is no distension.     Palpations: Abdomen is soft.     Tenderness: There is no abdominal tenderness.  Genitourinary:    Comments: Hematuria noted in foley Musculoskeletal:        General: Normal range of motion.     Cervical back: Normal range of motion and neck supple.  Right lower leg: Edema (2+ pitting edema) present.     Left lower leg: Edema (2+ pitting edema) present.     Comments: Minimal UE edema  Skin:    General: Skin is warm and dry.  Neurological:     General: No focal deficit present.     Mental Status: He is alert.  Psychiatric:        Mood and Affect: Mood normal.        Behavior: Behavior normal.      Consultants:  Nephrology   Procedures:    Data Reviewed: Results for orders placed or performed during the hospital encounter of 10-20-2023 (from the past 24 hour(s))  Glucose, capillary     Status: Abnormal   Collection Time: 09/27/23  4:38 AM  Result Value Ref Range   Glucose-Capillary 105 (H) 70 - 99 mg/dL  Glucose, capillary     Status: None   Collection Time: 09/27/23  8:23 AM  Result Value Ref Range   Glucose-Capillary 86 70 - 99 mg/dL  Magnesium     Status: Abnormal    Collection Time: 09/27/23  8:38 AM  Result Value Ref Range   Magnesium 2.6 (H) 1.7 - 2.4 mg/dL  CBC with Differential/Platelet     Status: Abnormal   Collection Time: 09/27/23  8:38 AM  Result Value Ref Range   WBC 19.7 (H) 4.0 - 10.5 K/uL   RBC 3.17 (L) 4.22 - 5.81 MIL/uL   Hemoglobin 9.2 (L) 13.0 - 17.0 g/dL   HCT 16.1 (L) 09.6 - 04.5 %   MCV 90.2 80.0 - 100.0 fL   MCH 29.0 26.0 - 34.0 pg   MCHC 32.2 30.0 - 36.0 g/dL   RDW 40.9 (H) 81.1 - 91.4 %   Platelets 468 (H) 150 - 400 K/uL   nRBC 0.2 0.0 - 0.2 %   Neutrophils Relative % 80 %   Neutro Abs 15.7 (H) 1.7 - 7.7 K/uL   Lymphocytes Relative 4 %   Lymphs Abs 0.7 0.7 - 4.0 K/uL   Monocytes Relative 11 %   Monocytes Absolute 2.2 (H) 0.1 - 1.0 K/uL   Eosinophils Relative 1 %   Eosinophils Absolute 0.2 0.0 - 0.5 K/uL   Basophils Relative 0 %   Basophils Absolute 0.1 0.0 - 0.1 K/uL   Immature Granulocytes 4 %   Abs Immature Granulocytes 0.85 (H) 0.00 - 0.07 K/uL  Renal function panel     Status: Abnormal   Collection Time: 09/27/23  8:38 AM  Result Value Ref Range   Sodium 137 135 - 145 mmol/L   Potassium 4.1 3.5 - 5.1 mmol/L   Chloride 101 98 - 111 mmol/L   CO2 17 (L) 22 - 32 mmol/L   Glucose, Bld 90 70 - 99 mg/dL   BUN 81 (H) 8 - 23 mg/dL   Creatinine, Ser 7.82 (H) 0.61 - 1.24 mg/dL   Calcium 7.8 (L) 8.9 - 10.3 mg/dL   Phosphorus 8.4 (H) 2.5 - 4.6 mg/dL   Albumin 1.6 (L) 3.5 - 5.0 g/dL   GFR, Estimated 7 (L) >60 mL/min   Anion gap 19 (H) 5 - 15  Glucose, capillary     Status: Abnormal   Collection Time: 09/27/23  3:30 PM  Result Value Ref Range   Glucose-Capillary 130 (H) 70 - 99 mg/dL    I have reviewed pertinent nursing notes, vitals, labs, and images as necessary. I have ordered labwork to follow up on as indicated.  I have reviewed  the last notes from staff over past 24 hours. I have discussed patient's care plan and test results with nursing staff, CM/SW, and other staff as appropriate.  Time spent: Greater  than 50% of the 55 minute visit was spent in counseling/coordination of care for the patient as laid out in the A&P.   LOS: 7 days   Lewie Chamber, MD [facility] 09/27/2023, 4:08 PM

## 2023-09-27 NOTE — Assessment & Plan Note (Addendum)
-   Foley catheter exchanged on 09/26/2023 which then was followed with ~400 cc of what looked like real hematuria - suspect some trauma in setting of foley and anticoagulation - Continue catheter flushes scheduled (no clots reported so far) -Continue holding Eliquis and aspirin -Pelvic ultrasound performed 09/29/2023 showing decompressed bladder but still some concern for blood products although may be small amount after discussion with urology - Due to concern for some possible outlet obstruction which could be part of the cause of the ATN, asking for urology input to make sure the reduced output is related to ATN and not potential clots in bladder

## 2023-09-27 NOTE — Plan of Care (Signed)
  Problem: Education: Goal: Knowledge of General Education information will improve Description: Including pain rating scale, medication(s)/side effects and non-pharmacologic comfort measures Outcome: Progressing   Problem: Coping: Goal: Level of anxiety will decrease Outcome: Progressing   Problem: Elimination: Goal: Will not experience complications related to bowel motility Outcome: Progressing   Problem: Elimination: Goal: Will not experience complications related to urinary retention Outcome: Progressing   Problem: Pain Managment: Goal: General experience of comfort will improve Outcome: Progressing   Problem: Safety: Goal: Ability to remain free from injury will improve Outcome: Progressing   Problem: Skin Integrity: Goal: Risk for impaired skin integrity will decrease Outcome: Progressing

## 2023-09-28 DIAGNOSIS — N170 Acute kidney failure with tubular necrosis: Secondary | ICD-10-CM | POA: Diagnosis not present

## 2023-09-28 DIAGNOSIS — A419 Sepsis, unspecified organism: Secondary | ICD-10-CM | POA: Diagnosis not present

## 2023-09-28 DIAGNOSIS — R319 Hematuria, unspecified: Secondary | ICD-10-CM | POA: Diagnosis not present

## 2023-09-28 DIAGNOSIS — I469 Cardiac arrest, cause unspecified: Secondary | ICD-10-CM | POA: Diagnosis not present

## 2023-09-28 LAB — CBC WITH DIFFERENTIAL/PLATELET
Abs Immature Granulocytes: 0.78 10*3/uL — ABNORMAL HIGH (ref 0.00–0.07)
Basophils Absolute: 0.1 10*3/uL (ref 0.0–0.1)
Basophils Relative: 0 %
Eosinophils Absolute: 0.2 10*3/uL (ref 0.0–0.5)
Eosinophils Relative: 1 %
HCT: 26 % — ABNORMAL LOW (ref 39.0–52.0)
Hemoglobin: 8.3 g/dL — ABNORMAL LOW (ref 13.0–17.0)
Immature Granulocytes: 5 %
Lymphocytes Relative: 5 %
Lymphs Abs: 0.8 10*3/uL (ref 0.7–4.0)
MCH: 28.9 pg (ref 26.0–34.0)
MCHC: 31.9 g/dL (ref 30.0–36.0)
MCV: 90.6 fL (ref 80.0–100.0)
Monocytes Absolute: 2 10*3/uL — ABNORMAL HIGH (ref 0.1–1.0)
Monocytes Relative: 12 %
Neutro Abs: 12.9 10*3/uL — ABNORMAL HIGH (ref 1.7–7.7)
Neutrophils Relative %: 77 %
Platelets: 488 10*3/uL — ABNORMAL HIGH (ref 150–400)
RBC: 2.87 MIL/uL — ABNORMAL LOW (ref 4.22–5.81)
RDW: 15.9 % — ABNORMAL HIGH (ref 11.5–15.5)
WBC: 16.7 10*3/uL — ABNORMAL HIGH (ref 4.0–10.5)
nRBC: 0.1 % (ref 0.0–0.2)

## 2023-09-28 LAB — RENAL FUNCTION PANEL
Albumin: 2.1 g/dL — ABNORMAL LOW (ref 3.5–5.0)
Anion gap: 15 (ref 5–15)
BUN: 86 mg/dL — ABNORMAL HIGH (ref 8–23)
CO2: 21 mmol/L — ABNORMAL LOW (ref 22–32)
Calcium: 7.9 mg/dL — ABNORMAL LOW (ref 8.9–10.3)
Chloride: 101 mmol/L (ref 98–111)
Creatinine, Ser: 7.81 mg/dL — ABNORMAL HIGH (ref 0.61–1.24)
GFR, Estimated: 6 mL/min — ABNORMAL LOW (ref >60)
Glucose, Bld: 104 mg/dL — ABNORMAL HIGH (ref 70–99)
Phosphorus: 8.9 mg/dL — ABNORMAL HIGH (ref 2.5–4.6)
Potassium: 3.8 mmol/L (ref 3.5–5.1)
Sodium: 137 mmol/L (ref 135–145)

## 2023-09-28 LAB — GLUCOSE, CAPILLARY
Glucose-Capillary: 105 mg/dL — ABNORMAL HIGH (ref 70–99)
Glucose-Capillary: 118 mg/dL — ABNORMAL HIGH (ref 70–99)
Glucose-Capillary: 128 mg/dL — ABNORMAL HIGH (ref 70–99)
Glucose-Capillary: 150 mg/dL — ABNORMAL HIGH (ref 70–99)
Glucose-Capillary: 224 mg/dL — ABNORMAL HIGH (ref 70–99)

## 2023-09-28 LAB — MAGNESIUM: Magnesium: 2.7 mg/dL — ABNORMAL HIGH (ref 1.7–2.4)

## 2023-09-28 MED ORDER — AMIODARONE HCL 200 MG PO TABS
200.0000 mg | ORAL_TABLET | Freq: Every day | ORAL | Status: DC
  Administered 2023-09-29 – 2023-10-02 (×4): 200 mg via ORAL
  Filled 2023-09-28 (×4): qty 1

## 2023-09-28 NOTE — Plan of Care (Signed)
  Problem: Education: Goal: Knowledge of General Education information will improve Description: Including pain rating scale, medication(s)/side effects and non-pharmacologic comfort measures Outcome: Progressing   Problem: Health Behavior/Discharge Planning: Goal: Ability to manage health-related needs will improve Outcome: Progressing   Problem: Clinical Measurements: Goal: Ability to maintain clinical measurements within normal limits will improve Outcome: Progressing Goal: Will remain free from infection Outcome: Progressing Goal: Diagnostic test results will improve Outcome: Progressing Goal: Respiratory complications will improve Outcome: Progressing Goal: Cardiovascular complication will be avoided Outcome: Progressing   Problem: Coping: Goal: Level of anxiety will decrease Outcome: Progressing   Problem: Elimination: Goal: Will not experience complications related to bowel motility Outcome: Progressing Goal: Will not experience complications related to urinary retention Outcome: Progressing   Problem: Pain Managment: Goal: General experience of comfort will improve Outcome: Progressing   Problem: Safety: Goal: Ability to remain free from injury will improve Outcome: Progressing   Problem: Skin Integrity: Goal: Risk for impaired skin integrity will decrease Outcome: Progressing   Problem: Safety: Goal: Non-violent Restraint(s) Outcome: Progressing   Problem: Education: Goal: Ability to describe self-care measures that may prevent or decrease complications (Diabetes Survival Skills Education) will improve Outcome: Progressing Goal: Individualized Educational Video(s) Outcome: Progressing   Problem: Coping: Goal: Ability to adjust to condition or change in health will improve Outcome: Progressing   Problem: Fluid Volume: Goal: Ability to maintain a balanced intake and output will improve Outcome: Progressing   Problem: Health Behavior/Discharge  Planning: Goal: Ability to identify and utilize available resources and services will improve Outcome: Progressing Goal: Ability to manage health-related needs will improve Outcome: Progressing   Problem: Metabolic: Goal: Ability to maintain appropriate glucose levels will improve Outcome: Progressing   Problem: Nutritional: Goal: Maintenance of adequate nutrition will improve Outcome: Progressing Goal: Progress toward achieving an optimal weight will improve Outcome: Progressing   Problem: Skin Integrity: Goal: Risk for impaired skin integrity will decrease Outcome: Progressing   Problem: Tissue Perfusion: Goal: Adequacy of tissue perfusion will improve Outcome: Progressing

## 2023-09-28 NOTE — Progress Notes (Signed)
Progress Note    Facundo Lantis   ZOX:[id number]  DOB: 06-14-1933  DOA: Oct 14, 2023     8 PCP: Royann Shivers, PA-C  Initial CC: initially abd pain  Hospital Course: Mr. Benner is an 89 yo male with PMH aflutter on Eliquis, COPD, HFrEF, HTN, HLD who initially presented to [facility] on 14-Oct-2023.  Has had increased urinary frequency. CXR with mild pulm edema. CT ABD/pelvis no acute abnormality; mild left hydroureteronephrosis; small bilateral pleural effusions. WBC/LA elevated. UA with nitrite and large leukocytes. Cultures obtained and given IV fluids. Patient started on Rocephin.  Due to worsening hypotension, he was transferred to the ICU.  He suffered PEA arrest with ROSC after 10 minutes.  He was then transferred to [facility]. He was able to be extubated on 09/22/2023.  He developed decreased UOP and ATN after his cardiac event.   Interval History:  No events overnight.  Still ongoing hematuria noted in foley but no reports of clots. Blood thinners on hold.   Assessment and Plan: * Septic shock (HCC)-resolved as of 09/24/2023 - due to UTI on admission - Ucx indeterminate; completing empiric course   Hematuria - Currently no clots.  Foley catheter exchanged on 09/26/2023 - suspect some trauma in setting of foley and anticoagulation - Continue catheter flushes scheduled -Continue holding Eliquis and aspirin  Pleural effusion on left - mod to large left effusion on CXR 9/25. In setting of ATN/volume overload would expect bilateral. With recent CPR does have risk for trauma/hemothorax notably with Hgb drop (which still could be multifactorial) - s/p thoracentesis on 9/26; noted to be bloody fluid and is exudative by criteria so likely this was hemothorax  Acute renal failure superimposed on stage 3b chronic kidney disease (HCC) - patient has history of CKD3b. Baseline creat ~ 1.9 - 2, eGFR~ 33-40 - at baseline on admission but then s/p cardiac arrest likely has developed  ATN from hypoperfusion - continue trending labs; CO2 starting to drop further and labs overall worse - nephrology continuing to follow - has been recommended for initiation of HD per nephrology - RIJ placed by IR on 9/27 for HD access   Cardiac arrest (HCC)-resolved as of 09/24/2023 - decompensated in setting of septic shock from UTI; found in PEA arrest with 10 minutes to ROSC - echo obtained 9/23: EF 60-65%, no RWMA, indeterminate diastology; small circumferential pericardial effusion  Acute respiratory failure with hypoxia (HCC) - in setting of cardiac arrest; extubated 9/23 - O2 requirement uptrended; now improved s/p throacentesis on 9/26 - continue weaning as able   PAF (paroxysmal atrial fibrillation) (HCC) - Holding Eliquis and aspirin until hematuria resolves -Continue amiodarone  Normocytic anemia - Prior baseline appears to be around 11 to 12 g/dL -Some downtrend expected in setting of volume resuscitation and also worsened renal function plus presumed hemothorax  Asymptomatic carotid artery stenosis without infarction, right - s/p right endarterectomy with vascular surgery on 11/25/22 -On aspirin and Eliquis at home (see hematuria) -Continue Lipitor  COPD (chronic obstructive pulmonary disease) (HCC) - no s/s exac  HFrEF (heart failure with reduced ejection fraction) (HCC) - EF 60-65% no RWMA; indeterminate diastology - prior hx of EF 45% in June 2023 per notes   Hyperlipidemia - Continue Lipitor  Benign prostatic hyperplasia - Continue Flomax  Essential hypertension - Avoiding hypotension but will start PRN's in case of persistently elevated blood pressure   Old records reviewed in assessment of this patient  Antimicrobials: Rocephin 10-14-2023 >> 09/26/2023  DVT prophylaxis:  Eliquis on hold   Code Status:   Code Status: Full Code  Mobility Assessment (Last 72 Hours)     Mobility Assessment     Row Name 09/28/23 0745 09/27/23 2207 09/27/23 0845  09/26/23 2000 09/26/23 1312   Does patient have an order for bedrest or is patient medically unstable No - Continue assessment No - Continue assessment No - Continue assessment No - Continue assessment --   What is the highest level of mobility based on the progressive mobility assessment? Level 2 (Chairfast) - Balance while sitting on edge of bed and cannot stand Level 2 (Chairfast) - Balance while sitting on edge of bed and cannot stand Level 3 (Stands with assist) - Balance while standing  and cannot march in place Level 5 (Walks with assist in room/hall) - Balance while stepping forward/back and can walk in room with assist - Complete Level 3 (Stands with assist) - Balance while standing  and cannot march in place   Is the above level different from baseline mobility prior to current illness? Yes - Recommend PT order Yes - Recommend PT order Yes - Recommend PT order Yes - Recommend PT order --    Row Name 09/26/23 0959 09/26/23 0830 09/25/23 1948       Does patient have an order for bedrest or is patient medically unstable -- No - Continue assessment No - Continue assessment     What is the highest level of mobility based on the progressive mobility assessment? Level 4 (Walks with assist in room) - Balance while marching in place and cannot step forward and back - Complete Level 5 (Walks with assist in room/hall) - Balance while stepping forward/back and can walk in room with assist - Complete Level 5 (Walks with assist in room/hall) - Balance while stepping forward/back and can walk in room with assist - Complete     Is the above level different from baseline mobility prior to current illness? -- Yes - Recommend PT order Yes - Recommend PT order              Barriers to discharge: none Disposition Plan:  Home Status is: Inpt  Objective: Blood pressure 118/75, pulse 60, temperature 97.7 F (36.5 C), temperature source Oral, resp. rate 18, height 5\' 10"  (1.778 m), weight 112.8 kg, SpO2  97%.  Examination:  Physical Exam Constitutional:      General: He is not in acute distress.    Appearance: Normal appearance.  HENT:     Head: Normocephalic and atraumatic.     Mouth/Throat:     Mouth: Mucous membranes are moist.  Eyes:     Extraocular Movements: Extraocular movements intact.  Cardiovascular:     Rate and Rhythm: Normal rate and regular rhythm.  Pulmonary:     Effort: Pulmonary effort is normal. No respiratory distress.     Breath sounds: Normal breath sounds. No wheezing.  Abdominal:     General: Bowel sounds are normal. There is no distension.     Palpations: Abdomen is soft.     Tenderness: There is no abdominal tenderness.  Genitourinary:    Comments: Hematuria noted in foley Musculoskeletal:        General: Normal range of motion.     Cervical back: Normal range of motion and neck supple.     Right lower leg: Edema (2+ pitting edema) present.     Left lower leg: Edema (2+ pitting edema) present.     Comments: Minimal UE edema  Skin:    General: Skin is warm and dry.  Neurological:     General: No focal deficit present.     Mental Status: He is alert.  Psychiatric:        Mood and Affect: Mood normal.        Behavior: Behavior normal.      Consultants:  Nephrology   Procedures:    Data Reviewed: Results for orders placed or performed during the hospital encounter of 10-14-23 (from the past 24 hour(s))  Glucose, capillary     Status: Abnormal   Collection Time: 09/27/23  3:30 PM  Result Value Ref Range   Glucose-Capillary 130 (H) 70 - 99 mg/dL  Glucose, capillary     Status: Abnormal   Collection Time: 09/27/23  8:54 PM  Result Value Ref Range   Glucose-Capillary 150 (H) 70 - 99 mg/dL  Glucose, capillary     Status: Abnormal   Collection Time: 09/28/23 12:34 AM  Result Value Ref Range   Glucose-Capillary 105 (H) 70 - 99 mg/dL  Renal function panel     Status: Abnormal   Collection Time: 09/28/23  3:19 AM  Result Value Ref Range    Sodium 137 135 - 145 mmol/L   Potassium 3.8 3.5 - 5.1 mmol/L   Chloride 101 98 - 111 mmol/L   CO2 21 (L) 22 - 32 mmol/L   Glucose, Bld 104 (H) 70 - 99 mg/dL   BUN 86 (H) 8 - 23 mg/dL   Creatinine, Ser 8.29 (H) 0.61 - 1.24 mg/dL   Calcium 7.9 (L) 8.9 - 10.3 mg/dL   Phosphorus 8.9 (H) 2.5 - 4.6 mg/dL   Albumin 2.1 (L) 3.5 - 5.0 g/dL   GFR, Estimated 6 (L) >60 mL/min   Anion gap 15 5 - 15  Magnesium     Status: Abnormal   Collection Time: 09/28/23  3:19 AM  Result Value Ref Range   Magnesium 2.7 (H) 1.7 - 2.4 mg/dL  CBC with Differential/Platelet     Status: Abnormal   Collection Time: 09/28/23  3:19 AM  Result Value Ref Range   WBC 16.7 (H) 4.0 - 10.5 K/uL   RBC 2.87 (L) 4.22 - 5.81 MIL/uL   Hemoglobin 8.3 (L) 13.0 - 17.0 g/dL   HCT 56.2 (L) 13.0 - 86.5 %   MCV 90.6 80.0 - 100.0 fL   MCH 28.9 26.0 - 34.0 pg   MCHC 31.9 30.0 - 36.0 g/dL   RDW 78.4 (H) 69.6 - 29.5 %   Platelets 488 (H) 150 - 400 K/uL   nRBC 0.1 0.0 - 0.2 %   Neutrophils Relative % 77 %   Neutro Abs 12.9 (H) 1.7 - 7.7 K/uL   Lymphocytes Relative 5 %   Lymphs Abs 0.8 0.7 - 4.0 K/uL   Monocytes Relative 12 %   Monocytes Absolute 2.0 (H) 0.1 - 1.0 K/uL   Eosinophils Relative 1 %   Eosinophils Absolute 0.2 0.0 - 0.5 K/uL   Basophils Relative 0 %   Basophils Absolute 0.1 0.0 - 0.1 K/uL   Immature Granulocytes 5 %   Abs Immature Granulocytes 0.78 (H) 0.00 - 0.07 K/uL  Glucose, capillary     Status: Abnormal   Collection Time: 09/28/23  8:31 AM  Result Value Ref Range   Glucose-Capillary 128 (H) 70 - 99 mg/dL  Glucose, capillary     Status: Abnormal   Collection Time: 09/28/23 11:46 AM  Result Value Ref Range   Glucose-Capillary 150 (H) 70 -  99 mg/dL    I have reviewed pertinent nursing notes, vitals, labs, and images as necessary. I have ordered labwork to follow up on as indicated.  I have reviewed the last notes from staff over past 24 hours. I have discussed patient's care plan and test results with  nursing staff, CM/SW, and other staff as appropriate.  Time spent: Greater than 50% of the 55 minute visit was spent in counseling/coordination of care for the patient as laid out in the A&P.   LOS: 8 days   Lewie Chamber, MD [facility] 09/28/2023, 12:18 PM

## 2023-09-28 NOTE — Progress Notes (Signed)
Patient ID: Timothy Mata, male   DOB: Feb 07, 1933, 89 y.o.   MRN: [id number] S: Patient feels well with no complaints.  Minimal urine output over the past 24 hours with 300 made.  Creatinine increased slightly to 7.8. O:BP (!) 109/57 (BP Location: Left Arm)   Pulse 75   Temp (!) 97.4 F (36.3 C) (Oral)   Resp 18   Ht 5\' 10"  (1.778 m)   Wt 112.8 kg   SpO2 98%   BMI 35.68 kg/m   Intake/Output Summary (Last 24 hours) at 09/28/2023 1025 Last data filed at 09/27/2023 1800 Gross per 24 hour  Intake 100 ml  Output 100 ml  Net 0 ml   Intake/Output: I/O last 3 completed shifts: In: 225 [P.O.:225] Out: 600 [Urine:300; Other:300]  Intake/Output this shift:  No intake/output data recorded. Weight change: -1 kg NWG:NFAOZHYQMVH ill-appearing, lying in bed, mouth sores present QIO:NGEXBM rate, no rub Resp:bilateral chest rise, mild iwob Abd: mild distention, soft Ext: Trace edema in the lower extremities  Recent Labs  Lab 09/21/23 1640 09/21/23 1952 09/22/23 0517 09/22/23 1412 09/22/23 2021 09/23/23 0410 09/24/23 0406 09/25/23 0336 09/26/23 0418 09/27/23 0838 09/28/23 0319  NA 135   < > 135   < > 136 133* 135 136 135 137 137  K 3.9   < > 4.6   < > 4.2 4.2 4.0 4.4 4.3 4.1 3.8  CL 102  --  104   < > 106 100 104 106 105 101 101  CO2 20*  --  22   < > 18* 18* 18* 16* 16* 17* 21*  GLUCOSE 186*  --  109*   < > 128* 101* 108* 93 94 90 104*  BUN 36*  --  42*   < > 52* 60* 72* 82* 95* 81* 86*  CREATININE 2.30*  --  3.15*   < > 4.10* 4.63* 6.05* 7.36* 8.50* 7.24* 7.81*  ALBUMIN 2.1*  --  1.6*  --   --   --  1.6* 1.8* 1.6* 1.6* 2.1*  CALCIUM 7.1*  --  7.2*   < > 7.3* 7.3* 7.7* 7.8* 7.8* 7.8* 7.9*  PHOS  --   --   --   --   --  5.5* 6.4* 7.8* 9.7* 8.4* 8.9*  AST 102*  --  108*  --   --   --  37  --   --   --   --   ALT 75*  --  88*  --   --   --  55*  --   --   --   --    < > = values in this interval not displayed.   Liver Function Tests: Recent Labs  Lab 09/21/23 1640 09/22/23 0517  09/24/23 0406 09/25/23 0336 09/26/23 0418 09/27/23 0838 09/28/23 0319  AST 102* 108* 37  --   --   --   --   ALT 75* 88* 55*  --   --   --   --   ALKPHOS 96 95 93  --   --   --   --   BILITOT 1.8* 1.3* 0.6  --   --   --   --   PROT 5.1* 4.5* 5.4*  --   --   --   --   ALBUMIN 2.1* 1.6* 1.6*   < > 1.6* 1.6* 2.1*   < > = values in this interval not displayed.   No results for input(s): "LIPASE", "AMYLASE" in  the last 168 hours.  No results for input(s): "AMMONIA" in the last 168 hours. CBC: Recent Labs  Lab 09/24/23 0406 09/25/23 0336 09/26/23 0418 09/27/23 0838 09/28/23 0319  WBC 33.0* 26.9* 23.7* 19.7* 16.7*  NEUTROABS  --   --   --  15.7* 12.9*  HGB 9.6* 9.8* 9.6* 9.2* 8.3*  HCT 30.3* 30.9* 30.5* 28.6* 26.0*  MCV 94.4 93.6 93.0 90.2 90.6  PLT 300 343 402* 468* 488*   Cardiac Enzymes: No results for input(s): "CKTOTAL", "CKMB", "CKMBINDEX", "TROPONINI" in the last 168 hours. CBG: Recent Labs  Lab 09/27/23 0823 09/27/23 1530 09/27/23 2054 09/28/23 0034 09/28/23 0831  GLUCAP 86 130* 150* 105* 128*    Iron Studies: No results for input(s): "IRON", "TIBC", "TRANSFERRIN", "FERRITIN" in the last 72 hours. Studies/Results: IR Fluoro Guide CV Line Right  Result Date: 09/26/2023 INDICATION: ESRD requiring HD initiation. EXAM: NON-TUNNELED CENTRAL VENOUS HEMODIALYSIS CATHETER PLACEMENT WITH ULTRASOUND AND FLUOROSCOPIC GUIDANCE COMPARISON:  Chest XR, 09/25/2023. MEDICATIONS: Local anesthetic was administered. FLUOROSCOPY TIME:  Fluoroscopic dose; 1 mGy COMPLICATIONS: None immediate. PROCEDURE: Informed written consent was obtained from the patient and/or patient's representative after a discussion of the risks, benefits, and alternatives to treatment. Questions regarding the procedure were encouraged and answered. The RIGHT neck and chest were prepped with chlorhexidine in a sterile fashion, and a sterile drape was applied covering the operative field. Maximum barrier sterile  technique with sterile gowns and gloves were used for the procedure. A timeout was performed prior to the initiation of the procedure. After the overlying soft tissues were anesthetized, a small venotomy incision was created and a micropuncture kit was utilized to access the internal jugular vein. Real-time ultrasound guidance was utilized for vascular access including the acquisition of a permanent ultrasound image documenting patency of the accessed vessel. The microwire was utilized to measure appropriate catheter length. A stiff glidewire was advanced to the level of the IVC. Under fluoroscopic guidance, the venotomy was serially dilated, ultimately allowing placement of a 20 cm temporary Trialysis catheter with tip ultimately terminating within the superior aspect of the right atrium. Final catheter positioning was confirmed and documented with a spot radiographic image. The catheter aspirates and flushes normally. The catheter was flushed with appropriate volume heparin dwells. The catheter exit site was secured with a 2-0 Ethilon retention suture. A dressing was placed. The patient tolerated the procedure well without immediate post procedural complication. IMPRESSION: Successful placement of a RIGHT internal jugular approach 20 cm temporary dialysis catheter The tip of the catheter is positioned within the proximal RIGHT atrium. The catheter is ready for immediate use. PLAN: This catheter may be converted to a tunneled dialysis catheter at a later date as indicated. Roanna Banning, MD [facility] Radiology Electronically Signed   By: Roanna Banning M.D.   On: 09/26/2023 17:45   IR US Guide Vasc Access Right  Result Date: 09/26/2023 INDICATION: ESRD requiring HD initiation. EXAM: NON-TUNNELED CENTRAL VENOUS HEMODIALYSIS CATHETER PLACEMENT WITH ULTRASOUND AND FLUOROSCOPIC GUIDANCE COMPARISON:  Chest XR, 09/25/2023. MEDICATIONS: Local anesthetic was administered.  FLUOROSCOPY TIME:  Fluoroscopic dose; 1 mGy COMPLICATIONS: None immediate. PROCEDURE: Informed written consent was obtained from the patient and/or patient's representative after a discussion of the risks, benefits, and alternatives to treatment. Questions regarding the procedure were encouraged and answered. The RIGHT neck and chest were prepped with chlorhexidine in a sterile fashion, and a sterile drape was applied covering the operative field. Maximum barrier sterile technique with sterile gowns and  gloves were used for the procedure. A timeout was performed prior to the initiation of the procedure. After the overlying soft tissues were anesthetized, a small venotomy incision was created and a micropuncture kit was utilized to access the internal jugular vein. Real-time ultrasound guidance was utilized for vascular access including the acquisition of a permanent ultrasound image documenting patency of the accessed vessel. The microwire was utilized to measure appropriate catheter length. A stiff glidewire was advanced to the level of the IVC. Under fluoroscopic guidance, the venotomy was serially dilated, ultimately allowing placement of a 20 cm temporary Trialysis catheter with tip ultimately terminating within the superior aspect of the right atrium. Final catheter positioning was confirmed and documented with a spot radiographic image. The catheter aspirates and flushes normally. The catheter was flushed with appropriate volume heparin dwells. The catheter exit site was secured with a 2-0 Ethilon retention suture. A dressing was placed. The patient tolerated the procedure well without immediate post procedural complication. IMPRESSION: Successful placement of a RIGHT internal jugular approach 20 cm temporary dialysis catheter The tip of the catheter is positioned within the proximal RIGHT atrium. The catheter is ready for immediate use. PLAN: This catheter may be converted to a tunneled dialysis catheter at a  later date as indicated. Roanna Banning, MD [facility] Radiology Electronically Signed   By: Roanna Banning M.D.   On: 09/26/2023 17:45    [START ON 09/29/2023] amiodarone  200 mg Oral Daily   atorvastatin  80 mg Oral q1800   Chlorhexidine Gluconate Cloth  6 each Topical Daily   fluticasone furoate-vilanterol  1 puff Inhalation Daily   levothyroxine  75 mcg Oral Q0600   lidocaine  20 mL Infiltration Once   mouth rinse  15 mL Mouth Rinse 4 times per day   polyethylene glycol  17 g Oral Daily   sevelamer carbonate  800 mg Oral TID WC   sodium bicarbonate  650 mg Oral BID   tamsulosin  0.4 mg Oral QPC supper    BMET    Component Value Date/Time   NA 137 09/28/2023 0319   K 3.8 09/28/2023 0319   CL 101 09/28/2023 0319   CO2 21 (L) 09/28/2023 0319   GLUCOSE 104 (H) 09/28/2023 0319   BUN 86 (H) 09/28/2023 0319   CREATININE 7.81 (H) 09/28/2023 0319   CALCIUM 7.9 (L) 09/28/2023 0319   GFRNONAA 6 (L) 09/28/2023 0319   GFRAA 47 (L) 07/18/2015 0939   CBC    Component Value Date/Time   WBC 16.7 (H) 09/28/2023 0319   RBC 2.87 (L) 09/28/2023 0319   HGB 8.3 (L) 09/28/2023 0319   HCT 26.0 (L) 09/28/2023 0319   PLT 488 (H) 09/28/2023 0319   MCV 90.6 09/28/2023 0319   MCH 28.9 09/28/2023 0319   MCHC 31.9 09/28/2023 0319   RDW 15.9 (H) 09/28/2023 0319   LYMPHSABS 0.8 09/28/2023 0319   MONOABS 2.0 (H) 09/28/2023 0319   EOSABS 0.2 09/28/2023 0319   BASOSABS 0.1 09/28/2023 0319    Assessment/Plan:  AKI/CKD Stage IIIb, oliguric - presumably ischemic ATN in setting of PEA arrest.  Blood pressures have improved.  Persistently worsening kidney function with low urine output.  Temp cath placed by IR on 9/28 (given concern for infection). Had first HD on 9/28. Possibly had some obstructive component so foley placed 9/28. Urine is very dark possibly some blood/atn.  Held dialysis yesterday to monitor urine output but minimal urine made.  Plan for dialysis  tomorrow.  Given leukocytosis improving IR likely to place tunneled dialysis catheter early next week Avoid nephrotoxic medications including NSAIDs and iodinated intravenous contrast exposure unless the latter is absolutely indicated.   Preferred narcotic agents for pain control are hydromorphone, fentanyl, and methadone. Morphine should not be used.  Avoid Baclofen and avoid oral sodium phosphate and magnesium citrate based laxatives / bowel preps.  Continue strict Input and Output monitoring. Will monitor the patient closely with you and intervene or adjust therapy as indicated by changes in clinical status/labs  PEA arrest and septic shock - off pressors, abx per primary Acute respiratory failure post arrest with underlying COPD - weaned to room air.  Per PCCM Afib/flutter - on eliquis and amiodarone. Normocytic anemia - likely combination of AKI/CKD and anemia of critical illness.  Transfuse for Hgb <7. May start esa next week pending progress HFrEF - EF 60% and RV function low normal.  Worsening edema, poor response to lasix. HD as above Hyperphosphatemia: Started sevelamer. CTM

## 2023-09-28 NOTE — Plan of Care (Signed)
  Problem: Education: Goal: Knowledge of General Education information will improve Description: Including pain rating scale, medication(s)/side effects and non-pharmacologic comfort measures Outcome: Progressing   Problem: Health Behavior/Discharge Planning: Goal: Ability to manage health-related needs will improve Outcome: Progressing   Problem: Coping: Goal: Level of anxiety will decrease Outcome: Progressing   Problem: Elimination: Goal: Will not experience complications related to bowel motility Outcome: Progressing   Problem: Pain Managment: Goal: General experience of comfort will improve Outcome: Progressing   Problem: Skin Integrity: Goal: Risk for impaired skin integrity will decrease Outcome: Progressing   Problem: Skin Integrity: Goal: Risk for impaired skin integrity will decrease Outcome: Progressing

## 2023-09-29 ENCOUNTER — Inpatient Hospital Stay (HOSPITAL_COMMUNITY): Payer: Medicare HMO

## 2023-09-29 DIAGNOSIS — A419 Sepsis, unspecified organism: Secondary | ICD-10-CM | POA: Diagnosis not present

## 2023-09-29 DIAGNOSIS — N170 Acute kidney failure with tubular necrosis: Secondary | ICD-10-CM | POA: Diagnosis not present

## 2023-09-29 DIAGNOSIS — R319 Hematuria, unspecified: Secondary | ICD-10-CM | POA: Diagnosis not present

## 2023-09-29 DIAGNOSIS — R6521 Severe sepsis with septic shock: Secondary | ICD-10-CM | POA: Diagnosis not present

## 2023-09-29 LAB — RENAL FUNCTION PANEL
Albumin: 2 g/dL — ABNORMAL LOW (ref 3.5–5.0)
Anion gap: 15 (ref 5–15)
BUN: 97 mg/dL — ABNORMAL HIGH (ref 8–23)
CO2: 19 mmol/L — ABNORMAL LOW (ref 22–32)
Calcium: 8 mg/dL — ABNORMAL LOW (ref 8.9–10.3)
Chloride: 103 mmol/L (ref 98–111)
Creatinine, Ser: 8.2 mg/dL — ABNORMAL HIGH (ref 0.61–1.24)
GFR, Estimated: 6 mL/min — ABNORMAL LOW (ref >60)
Glucose, Bld: 103 mg/dL — ABNORMAL HIGH (ref 70–99)
Phosphorus: 9.1 mg/dL — ABNORMAL HIGH (ref 2.5–4.6)
Potassium: 3.8 mmol/L (ref 3.5–5.1)
Sodium: 137 mmol/L (ref 135–145)

## 2023-09-29 LAB — CBC WITH DIFFERENTIAL/PLATELET
Abs Immature Granulocytes: 0.92 10*3/uL — ABNORMAL HIGH (ref 0.00–0.07)
Basophils Absolute: 0.1 10*3/uL (ref 0.0–0.1)
Basophils Relative: 0 %
Eosinophils Absolute: 0.2 10*3/uL (ref 0.0–0.5)
Eosinophils Relative: 1 %
HCT: 27.8 % — ABNORMAL LOW (ref 39.0–52.0)
Hemoglobin: 8.8 g/dL — ABNORMAL LOW (ref 13.0–17.0)
Immature Granulocytes: 5 %
Lymphocytes Relative: 5 %
Lymphs Abs: 1 10*3/uL (ref 0.7–4.0)
MCH: 29.2 pg (ref 26.0–34.0)
MCHC: 31.7 g/dL (ref 30.0–36.0)
MCV: 92.4 fL (ref 80.0–100.0)
Monocytes Absolute: 1.8 10*3/uL — ABNORMAL HIGH (ref 0.1–1.0)
Monocytes Relative: 10 %
Neutro Abs: 13.6 10*3/uL — ABNORMAL HIGH (ref 1.7–7.7)
Neutrophils Relative %: 79 %
Platelets: 534 10*3/uL — ABNORMAL HIGH (ref 150–400)
RBC: 3.01 MIL/uL — ABNORMAL LOW (ref 4.22–5.81)
RDW: 15.9 % — ABNORMAL HIGH (ref 11.5–15.5)
WBC: 17.5 10*3/uL — ABNORMAL HIGH (ref 4.0–10.5)
nRBC: 0.1 % (ref 0.0–0.2)

## 2023-09-29 LAB — IRON AND TIBC
Iron: 20 ug/dL — ABNORMAL LOW (ref 45–182)
Saturation Ratios: 10 % — ABNORMAL LOW (ref 17.9–39.5)
TIBC: 206 ug/dL — ABNORMAL LOW (ref 250–450)
UIBC: 186 ug/dL

## 2023-09-29 LAB — GLUCOSE, CAPILLARY
Glucose-Capillary: 112 mg/dL — ABNORMAL HIGH (ref 70–99)
Glucose-Capillary: 126 mg/dL — ABNORMAL HIGH (ref 70–99)
Glucose-Capillary: 135 mg/dL — ABNORMAL HIGH (ref 70–99)

## 2023-09-29 LAB — MAGNESIUM: Magnesium: 2.8 mg/dL — ABNORMAL HIGH (ref 1.7–2.4)

## 2023-09-29 LAB — FERRITIN: Ferritin: 387 ng/mL — ABNORMAL HIGH (ref 24–336)

## 2023-09-29 MED ORDER — SEVELAMER CARBONATE 800 MG PO TABS
1600.0000 mg | ORAL_TABLET | Freq: Three times a day (TID) | ORAL | Status: DC
  Administered 2023-09-30 – 2023-10-03 (×7): 1600 mg via ORAL
  Filled 2023-09-29 (×9): qty 2

## 2023-09-29 MED ORDER — HEPARIN SODIUM (PORCINE) 1000 UNIT/ML IJ SOLN
INTRAMUSCULAR | Status: AC
  Filled 2023-09-29: qty 3

## 2023-09-29 MED ORDER — HEPARIN SODIUM (PORCINE) 1000 UNIT/ML IJ SOLN
2800.0000 [IU] | Freq: Once | INTRAMUSCULAR | Status: AC
  Administered 2023-09-29: 2800 [IU]

## 2023-09-29 NOTE — Progress Notes (Signed)
Patient ID: Timothy Mata, male   DOB: December 10, 1933, 89 y.o.   MRN: [id number]   Subjective:  He had 550 mL uop over 9/29.  Last HD on 9/27 with 300 mL UF.  He has been on 2 liters oxygen; not on oxygen at home.   Review of systems:  Denies shortness of breath or chest pain  Denies n/v     O:BP 99/81 (BP Location: Right Arm)   Pulse 67   Temp 98.8 F (37.1 C)   Resp 14   Ht 5\' 10"  (1.778 m)   Wt 112 kg   SpO2 98%   BMI 35.43 kg/m   Intake/Output Summary (Last 24 hours) at 09/29/2023 0759 Last data filed at 09/29/2023 0500 Gross per 24 hour  Intake 350 ml  Output 550 ml  Net -200 ml   Intake/Output: I/O last 3 completed shifts: In: 350 [P.O.:250; Other:100] Out: 550 [Urine:550]  Intake/Output this shift:  No intake/output data recorded. Weight change: -0.8 kg  General elderly male in bed in no acute distress HEENT normocephalic atraumatic extraocular movements intact sclera anicteric; multiple lesions of his lips   Neck supple trachea midline Lungs clear to auscultation bilaterally normal work of breathing at rest on 2 liters oxygen Heart S1S2 no rub Abdomen soft nontender mildly distended Extremities 2+ edema lower extremities Psych normal mood and affect Neuro - alert and oriented x3 provides basic hx and follows commands  GU - foley catheter in place, dark urine     Recent Labs  Lab 09/23/23 0410 09/24/23 0406 09/25/23 0336 09/26/23 0418 09/27/23 0838 09/28/23 0319 09/29/23 0413  NA 133* 135 136 135 137 137 137  K 4.2 4.0 4.4 4.3 4.1 3.8 3.8  CL 100 104 106 105 101 101 103  CO2 18* 18* 16* 16* 17* 21* 19*  GLUCOSE 101* 108* 93 94 90 104* 103*  BUN 60* 72* 82* 95* 81* 86* 97*  CREATININE 4.63* 6.05* 7.36* 8.50* 7.24* 7.81* 8.20*  ALBUMIN  --  1.6* 1.8* 1.6* 1.6* 2.1* 2.0*  CALCIUM 7.3* 7.7* 7.8* 7.8* 7.8* 7.9* 8.0*  PHOS 5.5* 6.4* 7.8* 9.7* 8.4* 8.9* 9.1*  AST  --  37  --   --   --   --   --   ALT  --  55*  --   --   --   --   --    Liver Function  Tests: Recent Labs  Lab 09/24/23 0406 09/25/23 0336 09/27/23 0838 09/28/23 0319 09/29/23 0413  AST 37  --   --   --   --   ALT 55*  --   --   --   --   ALKPHOS 93  --   --   --   --   BILITOT 0.6  --   --   --   --   PROT 5.4*  --   --   --   --   ALBUMIN 1.6*   < > 1.6* 2.1* 2.0*   < > = values in this interval not displayed.    No results for input(s): "AMMONIA" in the last 168 hours. CBC: Recent Labs  Lab 09/25/23 0336 09/26/23 0418 09/27/23 0838 09/28/23 0319 09/29/23 0413  WBC 26.9* 23.7* 19.7* 16.7* 17.5*  NEUTROABS  --   --  15.7* 12.9* 13.6*  HGB 9.8* 9.6* 9.2* 8.3* 8.8*  HCT 30.9* 30.5* 28.6* 26.0* 27.8*  MCV 93.6 93.0 90.2 90.6 92.4  PLT 343 402* 468* 488* 534*  Cardiac Enzymes: No results for input(s): "CKTOTAL", "CKMB", "CKMBINDEX", "TROPONINI" in the last 168 hours. CBG: Recent Labs  Lab 09/28/23 1146 09/28/23 1622 09/28/23 2047 09/29/23 0023 09/29/23 0757  GLUCAP 150* 118* 224* 126* 135*    Studies/Results: No results found.  amiodarone  200 mg Oral Daily   atorvastatin  80 mg Oral q1800   Chlorhexidine Gluconate Cloth  6 each Topical Daily   fluticasone furoate-vilanterol  1 puff Inhalation Daily   levothyroxine  75 mcg Oral Q0600   lidocaine  20 mL Infiltration Once   mouth rinse  15 mL Mouth Rinse 4 times per day   polyethylene glycol  17 g Oral Daily   sevelamer carbonate  800 mg Oral TID WC   tamsulosin  0.4 mg Oral QPC supper    BMET    Component Value Date/Time   NA 137 09/29/2023 0413   K 3.8 09/29/2023 0413   CL 103 09/29/2023 0413   CO2 19 (L) 09/29/2023 0413   GLUCOSE 103 (H) 09/29/2023 0413   BUN 97 (H) 09/29/2023 0413   CREATININE 8.20 (H) 09/29/2023 0413   CALCIUM 8.0 (L) 09/29/2023 0413   GFRNONAA 6 (L) 09/29/2023 0413   GFRAA 47 (L) 07/18/2015 0939   CBC    Component Value Date/Time   WBC 17.5 (H) 09/29/2023 0413   RBC 3.01 (L) 09/29/2023 0413   HGB 8.8 (L) 09/29/2023 0413   HCT 27.8 (L) 09/29/2023 0413    PLT 534 (H) 09/29/2023 0413   MCV 92.4 09/29/2023 0413   MCH 29.2 09/29/2023 0413   MCHC 31.7 09/29/2023 0413   RDW 15.9 (H) 09/29/2023 0413   LYMPHSABS 1.0 09/29/2023 0413   MONOABS 1.8 (H) 09/29/2023 0413   EOSABS 0.2 09/29/2023 0413   BASOSABS 0.1 09/29/2023 0413    Assessment/Plan:  AKI/CKD Stage IIIb, oliguric - presumably ischemic ATN in setting of PEA arrest.  Blood pressures have improved.  Persistently worsening kidney function with low urine output.  Temp cath placed by IR on 9/28 (given concern for infection). Had first HD on 9/28. Possibly had some obstructive component and mild left hydro so foley placed 9/28. Urine very dark possibly some blood/ATN.   Plan for HD today and on 10/2.  Assess dialysis needs daily.   Leukocytosis overall improving; IR likely to place tunneled dialysis catheter this week per charting - will need to check to see if on schedule  Continue strict ins/outs  PEA arrest and septic shock - shock resolved. Abx per primary  Acute respiratory failure post arrest with underlying COPD - weaned to room air.   Afib/flutter - on amiodarone per primary team  CKD stage 3b - baseline Cr 1.9 - 2.4 Normocytic anemia - likely combination of AKI/CKD and anemia of critical illness.  Transfuse for Hgb <7. Anticipate need to start ESA this week.  Add on iron panel  HFrEF - EF 60% and RV function low normal.  Per charting poor response to lasix. HD as above to optimize as needed Hyperphosphatemia: increased sevelamer dose.  For HD as above  Disposition - continue inpatient monitoring   Estanislado Emms, MD 8:18 AM 09/29/2023

## 2023-09-29 NOTE — Progress Notes (Signed)
Progress Note    Timothy Mata   ZOX:[id number]  DOB: 08/29/1933  DOA: 09/12/2023     9 PCP: Timothy Shivers, PA-C  Initial CC: initially abd pain  Hospital Course: Timothy Mata is an 89 yo male with PMH aflutter on Eliquis, COPD, HFrEF, HTN, HLD who initially presented to [facility] on 09/11/2023.  Has had increased urinary frequency. CXR with mild pulm edema. CT ABD/pelvis no acute abnormality; mild left hydroureteronephrosis; small bilateral pleural effusions. WBC/LA elevated. UA with nitrite and large leukocytes. Cultures obtained and given IV fluids. Patient started on Rocephin.  Due to worsening hypotension, he was transferred to the ICU.  He suffered PEA arrest with ROSC after 10 minutes.  He was then transferred to [facility]. He was able to be extubated on 09/22/2023.  He developed decreased UOP and ATN after his cardiac event.   Interval History:  No events overnight.  Still ongoing small hematuria noted in foley but no reports of clots. Blood thinners on hold.  Checking pelvic ultrasound today. He's getting HD today and Wednesday this week.   Assessment and Plan: * Septic shock (HCC)-resolved as of 09/24/2023 - due to UTI on admission - Ucx indeterminate; completing empiric course   Hematuria - Currently no clots.  Foley catheter exchanged on 09/26/2023 - suspect some trauma in setting of foley and anticoagulation - Continue catheter flushes scheduled -Continue holding Eliquis and aspirin - checking pelvic u/s to rule out BOO or clot/hemorrhage due to having had hemothorax s/p CPR also   Acute renal failure superimposed on stage 3b chronic kidney disease (HCC) - patient has history of CKD3b. Baseline creat ~ 1.9 - 2, eGFR~ 33-40 - at baseline on admission but then s/p cardiac arrest likely has developed ATN from hypoperfusion - continue trending labs; CO2 starting to drop further and labs overall worse - nephrology continuing to follow - has been recommended for  initiation of HD per nephrology - RIJ placed by IR on 9/27 for HD access   Pleural effusion on left-resolved as of 09/29/2023 - mod to large left effusion on CXR 9/25. In setting of ATN/volume overload would expect bilateral. With recent CPR does have risk for trauma/hemothorax notably with Hgb drop (which still could be multifactorial) - s/p thoracentesis on 9/26; noted to be bloody fluid and is exudative by criteria so likely this was hemothorax  Cardiac arrest (HCC)-resolved as of 09/24/2023 - decompensated in setting of septic shock from UTI; found in PEA arrest with 10 minutes to ROSC - echo obtained 9/23: EF 60-65%, no RWMA, indeterminate diastology; small circumferential pericardial effusion  Acute respiratory failure with hypoxia (HCC) - in setting of cardiac arrest; extubated 9/23 - O2 requirement uptrended; now improved s/p throacentesis on 9/26 - continue weaning as able   PAF (paroxysmal atrial fibrillation) (HCC) - Holding Eliquis and aspirin until hematuria resolves -Continue amiodarone  Normocytic anemia - Prior baseline appears to be around 11 to 12 g/dL -Some downtrend expected in setting of volume resuscitation and also worsened renal function plus presumed hemothorax  Asymptomatic carotid artery stenosis without infarction, right - s/p right endarterectomy with vascular surgery on 11/25/22 -On aspirin and Eliquis at home (see hematuria) -Continue Lipitor  COPD (chronic obstructive pulmonary disease) (HCC) - no s/s exac  HFrEF (heart failure with reduced ejection fraction) (HCC) - EF 60-65% no RWMA; indeterminate diastology - prior hx of EF 45% in June 2023 per notes   Hyperlipidemia - Continue Lipitor  Benign prostatic hyperplasia - Continue Flomax  Essential hypertension - Avoiding hypotension but will start PRN's in case of persistently elevated blood pressure   Old records reviewed in assessment of this patient  Antimicrobials: Rocephin 09/22/2023  >> 09/26/2023  DVT prophylaxis:  Eliquis on hold   Code Status:   Code Status: Full Code  Mobility Assessment (Last 72 Hours)     Mobility Assessment     Row Name 09/29/23 1200 09/29/23 0830 09/28/23 2152 09/28/23 0745 09/27/23 2207   Does patient have an order for bedrest or is patient medically unstable -- No - Continue assessment No - Continue assessment No - Continue assessment No - Continue assessment   What is the highest level of mobility based on the progressive mobility assessment? Level 4 (Walks with assist in room) - Balance while marching in place and cannot step forward and back - Complete Level 2 (Chairfast) - Balance while sitting on edge of bed and cannot stand Level 5 (Walks with assist in room/hall) - Balance while stepping forward/back and can walk in room with assist - Complete Level 2 (Chairfast) - Balance while sitting on edge of bed and cannot stand Level 2 (Chairfast) - Balance while sitting on edge of bed and cannot stand   Is the above level different from baseline mobility prior to current illness? -- Yes - Recommend PT order Yes - Recommend PT order Yes - Recommend PT order Yes - Recommend PT order    Row Name 09/27/23 0845 09/26/23 2000         Does patient have an order for bedrest or is patient medically unstable No - Continue assessment No - Continue assessment      What is the highest level of mobility based on the progressive mobility assessment? Level 3 (Stands with assist) - Balance while standing  and cannot march in place Level 5 (Walks with assist in room/hall) - Balance while stepping forward/back and can walk in room with assist - Complete      Is the above level different from baseline mobility prior to current illness? Yes - Recommend PT order Yes - Recommend PT order               Barriers to discharge: none Disposition Plan:  Home Status is: Inpt  Objective: Blood pressure 109/60, pulse (!) 114, temperature 98.8 F (37.1 C), temperature  source Oral, resp. rate 20, height 5\' 10"  (1.778 m), weight 112 kg, SpO2 99%.  Examination:  Physical Exam Constitutional:      General: He is not in acute distress.    Appearance: Normal appearance.  HENT:     Head: Normocephalic and atraumatic.     Mouth/Throat:     Mouth: Mucous membranes are moist.  Eyes:     Extraocular Movements: Extraocular movements intact.  Cardiovascular:     Rate and Rhythm: Normal rate and regular rhythm.  Pulmonary:     Effort: Pulmonary effort is normal. No respiratory distress.     Breath sounds: Normal breath sounds. No wheezing.  Abdominal:     General: Bowel sounds are normal. There is no distension.     Palpations: Abdomen is soft.     Tenderness: There is no abdominal tenderness.  Genitourinary:    Comments: Hematuria noted in foley Musculoskeletal:        General: Normal range of motion.     Cervical back: Normal range of motion and neck supple.     Right lower leg: Edema (2+ pitting edema) present.     Left  lower leg: Edema (2+ pitting edema) present.     Comments: No furhter UE edema  Skin:    General: Skin is warm and dry.  Neurological:     General: No focal deficit present.     Mental Status: He is alert.  Psychiatric:        Mood and Affect: Mood normal.        Behavior: Behavior normal.      Consultants:  Nephrology   Procedures:    Data Reviewed: Results for orders placed or performed during the hospital encounter of 09/18/2023 (from the past 24 hour(s))  Glucose, capillary     Status: Abnormal   Collection Time: 09/28/23  4:22 PM  Result Value Ref Range   Glucose-Capillary 118 (H) 70 - 99 mg/dL  Glucose, capillary     Status: Abnormal   Collection Time: 09/28/23  8:47 PM  Result Value Ref Range   Glucose-Capillary 224 (H) 70 - 99 mg/dL  Glucose, capillary     Status: Abnormal   Collection Time: 09/29/23 12:23 AM  Result Value Ref Range   Glucose-Capillary 126 (H) 70 - 99 mg/dL  Renal function panel     Status:  Abnormal   Collection Time: 09/29/23  4:13 AM  Result Value Ref Range   Sodium 137 135 - 145 mmol/L   Potassium 3.8 3.5 - 5.1 mmol/L   Chloride 103 98 - 111 mmol/L   CO2 19 (L) 22 - 32 mmol/L   Glucose, Bld 103 (H) 70 - 99 mg/dL   BUN 97 (H) 8 - 23 mg/dL   Creatinine, Ser 3.08 (H) 0.61 - 1.24 mg/dL   Calcium 8.0 (L) 8.9 - 10.3 mg/dL   Phosphorus 9.1 (H) 2.5 - 4.6 mg/dL   Albumin 2.0 (L) 3.5 - 5.0 g/dL   GFR, Estimated 6 (L) >60 mL/min   Anion gap 15 5 - 15  Magnesium     Status: Abnormal   Collection Time: 09/29/23  4:13 AM  Result Value Ref Range   Magnesium 2.8 (H) 1.7 - 2.4 mg/dL  CBC with Differential/Platelet     Status: Abnormal   Collection Time: 09/29/23  4:13 AM  Result Value Ref Range   WBC 17.5 (H) 4.0 - 10.5 K/uL   RBC 3.01 (L) 4.22 - 5.81 MIL/uL   Hemoglobin 8.8 (L) 13.0 - 17.0 g/dL   HCT 65.7 (L) 84.6 - 96.2 %   MCV 92.4 80.0 - 100.0 fL   MCH 29.2 26.0 - 34.0 pg   MCHC 31.7 30.0 - 36.0 g/dL   RDW 95.2 (H) 84.1 - 32.4 %   Platelets 534 (H) 150 - 400 K/uL   nRBC 0.1 0.0 - 0.2 %   Neutrophils Relative % 79 %   Neutro Abs 13.6 (H) 1.7 - 7.7 K/uL   Lymphocytes Relative 5 %   Lymphs Abs 1.0 0.7 - 4.0 K/uL   Monocytes Relative 10 %   Monocytes Absolute 1.8 (H) 0.1 - 1.0 K/uL   Eosinophils Relative 1 %   Eosinophils Absolute 0.2 0.0 - 0.5 K/uL   Basophils Relative 0 %   Basophils Absolute 0.1 0.0 - 0.1 K/uL   Immature Granulocytes 5 %   Abs Immature Granulocytes 0.92 (H) 0.00 - 0.07 K/uL  Glucose, capillary     Status: Abnormal   Collection Time: 09/29/23  7:57 AM  Result Value Ref Range   Glucose-Capillary 135 (H) 70 - 99 mg/dL    I have reviewed pertinent nursing notes, vitals,  labs, and images as necessary. I have ordered labwork to follow up on as indicated.  I have reviewed the last notes from staff over past 24 hours. I have discussed patient's care plan and test results with nursing staff, CM/SW, and other staff as appropriate.  Time spent: Greater  than 50% of the 55 minute visit was spent in counseling/coordination of care for the patient as laid out in the A&P.   LOS: 9 days   Lewie Chamber, MD [facility] 09/29/2023, 1:24 PM

## 2023-09-29 NOTE — Progress Notes (Addendum)
Received patient in bed to unit.  Alert and oriented.  Informed consent signed and in chart.   TX duration:2.5  Patient tolerated well.  Transported back to the room  Alert, without acute distress.  Hand-off given to patient's nurse.   Access used: R HD Cath Access issues: none  Total UF removed: Medication(s) given: none   09/29/23 1547  Vitals  Temp 98.1 F (36.7 C)  Temp Source Oral  BP 124/74  MAP (mmHg) 86  Pulse Rate 77  ECG Heart Rate (!) 122  Resp (!) 25  Level of Consciousness  Level of Consciousness Alert  MEWS COLOR  MEWS Score Color Yellow  Oxygen Therapy  SpO2 100 %  O2 Device Nasal Cannula  O2 Flow Rate (L/min) 2 L/min  Pain Assessment  Pain Scale 0-10  Pain Score 0  PCA/Epidural/Spinal Assessment  Respiratory Pattern Regular;Unlabored  ECG Monitoring  Cardiac Rhythm Atrial fibrillation  MEWS Score  MEWS Temp 0  MEWS Systolic 0  MEWS Pulse 2  MEWS RR 1  MEWS LOC 0  MEWS Score 3     Stacie Glaze LPN [facility]

## 2023-09-29 NOTE — Progress Notes (Signed)
Occupational Therapy Treatment Patient Details Name: Timothy Mata MRN: [id number] DOB: [birth year] Today's Date: 09/29/2023   History of present illness 89 y.o. male admitted at [facility] 9/21 with abdominal pain, UTI, sepsis.  9/22 cardiac arrest,  PEA 8-10 min with transfer to [facility] Intubation 9/22-9/23. PMHx: Aflutter on Eliquis and amiodarone, BPH, COPD, HTN, gout   OT comments  Pt is not making progress towards their acute OT goals. Nagee required encouragement to participate. Overall he needed superivsion A with increased time for bed mobility and CGA for transfers and short mobility with RW. SpO2 down to 85% on 2L, cues for PLB and >2 minutes to recover to >92%. Education started on energy conservation, as pt has decreased activity tolerance and increased SOB this date. OT to continue to follow acutely to facilitate progress towards established goals. Pt will continue to benefit from [facility].       If plan is discharge home, recommend the following:  A little help with walking and/or transfers;A little help with bathing/dressing/bathroom;Assistance with cooking/housework;Direct supervision/assist for medications management;Direct supervision/assist for financial management   Equipment Recommendations  None recommended by OT       Precautions / Restrictions Precautions Precautions: Fall;Other (comment) Restrictions Weight Bearing Restrictions: No       Mobility Bed Mobility Overal bed mobility: Needs Assistance Bed Mobility: Supine to Sit, Sit to Supine     Supine to sit: Supervision Sit to supine: Supervision   General bed mobility comments: increased time and effort needed, SOB noted once sitting    Transfers Overall transfer level: Needs assistance Equipment used: Rolling walker (2 wheels) Transfers: Sit to/from Stand Sit to Stand: Contact guard assist                 Balance Overall balance assessment: Needs assistance Sitting-balance support: No upper extremity  supported, Feet supported Sitting balance-Leahy Scale: Good     Standing balance support: Bilateral upper extremity supported, During functional activity, Reliant on assistive device for balance Standing balance-Leahy Scale: Poor                             ADL either performed or assessed with clinical judgement   ADL Overall ADL's : Needs assistance/impaired Eating/Feeding: Independent   Grooming: Contact guard assist;Standing Grooming Details (indicate cue type and reason): at the sink                 Toilet Transfer: Contact guard assist;Ambulation;Rolling walker (2 wheels) Toilet Transfer Details (indicate cue type and reason): limited activity tolerance         Functional mobility during ADLs: Contact guard assist;Rolling walker (2 wheels) General ADL Comments: 2L, poor activty tolerance, only tolerated room mobility and needed 2 minutes of PLB to recover    Extremity/Trunk Assessment Upper Extremity Assessment Upper Extremity Assessment: Generalized weakness   Lower Extremity Assessment Lower Extremity Assessment: Defer to PT evaluation        Vision   Vision Assessment?: No apparent visual deficits   Perception Perception Perception: Within Functional Limits   Praxis Praxis Praxis: WFL    Cognition Arousal: Alert Behavior During Therapy: Flat affect Overall Cognitive Status: Within Functional Limits for tasks assessed                 General Comments: needed encouragement to participate              General Comments SpO2 down to 85% after mobility on 2L  Pertinent Vitals/ Pain       Pain Assessment Pain Assessment: Faces Faces Pain Scale: Hurts a little bit Pain Location: low back Pain Descriptors / Indicators: Sore Pain Intervention(s): Limited activity within patient's tolerance, Monitored during session         Frequency  Min 1X/week        Progress Toward Goals  OT Goals(current goals can now be found  in the care plan section)  Progress towards OT goals: Not progressing toward goals - comment (decline in activity tolerance this date)  Acute Rehab OT Goals Patient Stated Goal: to rest OT Goal Formulation: With patient Time For Goal Achievement: 10/07/23 Potential to Achieve Goals: Good ADL Goals Additional ADL Goal #1: pt will complete BADLs with mod I Additional ADL Goal #2: Pt will tolerate at least 10 minutes of OOB activity to demonstrate increased endurance for ADLs at home   AM-PAC OT "6 Clicks" Daily Activity     Outcome Measure   Help from another person eating meals?: None Help from another person taking care of personal grooming?: A Little Help from another person toileting, which includes using toliet, bedpan, or urinal?: A Little Help from another person bathing (including washing, rinsing, drying)?: A Little Help from another person to put on and taking off regular upper body clothing?: A Little Help from another person to put on and taking off regular lower body clothing?: A Little 6 Click Score: 19    End of Session Equipment Utilized During Treatment: Rolling walker (2 wheels);Oxygen  OT Visit Diagnosis: Unsteadiness on feet (R26.81);Other abnormalities of gait and mobility (R26.89);Muscle weakness (generalized) (M62.81)   Activity Tolerance Patient limited by fatigue   Patient Left in bed;with call bell/phone within reach;with bed alarm set   Nurse Communication Mobility status        Time: 1146-1200 OT Time Calculation (min): 14 min  Charges: OT General Charges $OT Visit: 1 Visit OT Treatments $Therapeutic Activity: 8-22 mins  Derenda Mis, OTR/L Acute Rehabilitation Services Office [phone] Secure Chat Communication Preferred   Donia Pounds 09/29/2023, 12:47 PM

## 2023-09-29 NOTE — Plan of Care (Signed)
  Problem: Education: Goal: Knowledge of General Education information will improve Description: Including pain rating scale, medication(s)/side effects and non-pharmacologic comfort measures Outcome: Progressing   Problem: Health Behavior/Discharge Planning: Goal: Ability to manage health-related needs will improve Outcome: Progressing   Problem: Clinical Measurements: Goal: Ability to maintain clinical measurements within normal limits will improve Outcome: Progressing Goal: Will remain free from infection Outcome: Progressing Goal: Diagnostic test results will improve Outcome: Progressing Goal: Respiratory complications will improve Outcome: Progressing Goal: Cardiovascular complication will be avoided Outcome: Progressing   Problem: Activity: Goal: Risk for activity intolerance will decrease Outcome: Progressing   Problem: Coping: Goal: Level of anxiety will decrease Outcome: Progressing   Problem: Elimination: Goal: Will not experience complications related to bowel motility Outcome: Progressing Goal: Will not experience complications related to urinary retention Outcome: Progressing   Problem: Pain Managment: Goal: General experience of comfort will improve Outcome: Progressing   Problem: Safety: Goal: Ability to remain free from injury will improve Outcome: Progressing   Problem: Skin Integrity: Goal: Risk for impaired skin integrity will decrease Outcome: Progressing   Problem: Safety: Goal: Non-violent Restraint(s) Outcome: Progressing   Problem: Education: Goal: Ability to describe self-care measures that may prevent or decrease complications (Diabetes Survival Skills Education) will improve Outcome: Progressing Goal: Individualized Educational Video(s) Outcome: Progressing   Problem: Coping: Goal: Ability to adjust to condition or change in health will improve Outcome: Progressing   Problem: Fluid Volume: Goal: Ability to maintain a balanced  intake and output will improve Outcome: Progressing   Problem: Health Behavior/Discharge Planning: Goal: Ability to identify and utilize available resources and services will improve Outcome: Progressing Goal: Ability to manage health-related needs will improve Outcome: Progressing   Problem: Metabolic: Goal: Ability to maintain appropriate glucose levels will improve Outcome: Progressing   Problem: Nutritional: Goal: Maintenance of adequate nutrition will improve Outcome: Progressing Goal: Progress toward achieving an optimal weight will improve Outcome: Progressing   Problem: Skin Integrity: Goal: Risk for impaired skin integrity will decrease Outcome: Progressing   Problem: Tissue Perfusion: Goal: Adequacy of tissue perfusion will improve Outcome: Progressing

## 2023-09-30 ENCOUNTER — Inpatient Hospital Stay (HOSPITAL_COMMUNITY): Payer: Medicare HMO

## 2023-09-30 DIAGNOSIS — R319 Hematuria, unspecified: Secondary | ICD-10-CM | POA: Diagnosis not present

## 2023-09-30 DIAGNOSIS — D72829 Elevated white blood cell count, unspecified: Secondary | ICD-10-CM | POA: Diagnosis not present

## 2023-09-30 DIAGNOSIS — A419 Sepsis, unspecified organism: Secondary | ICD-10-CM | POA: Diagnosis not present

## 2023-09-30 DIAGNOSIS — N170 Acute kidney failure with tubular necrosis: Secondary | ICD-10-CM | POA: Diagnosis not present

## 2023-09-30 LAB — CBC WITH DIFFERENTIAL/PLATELET
Abs Immature Granulocytes: 0 10*3/uL (ref 0.00–0.07)
Basophils Absolute: 0.2 10*3/uL — ABNORMAL HIGH (ref 0.0–0.1)
Basophils Relative: 1 %
Eosinophils Absolute: 0.2 10*3/uL (ref 0.0–0.5)
Eosinophils Relative: 1 %
HCT: 28 % — ABNORMAL LOW (ref 39.0–52.0)
Hemoglobin: 9 g/dL — ABNORMAL LOW (ref 13.0–17.0)
Lymphocytes Relative: 3 %
Lymphs Abs: 0.6 10*3/uL — ABNORMAL LOW (ref 0.7–4.0)
MCH: 28.7 pg (ref 26.0–34.0)
MCHC: 32.1 g/dL (ref 30.0–36.0)
MCV: 89.2 fL (ref 80.0–100.0)
Monocytes Absolute: 1 10*3/uL (ref 0.1–1.0)
Monocytes Relative: 5 %
Neutro Abs: 18.5 10*3/uL — ABNORMAL HIGH (ref 1.7–7.7)
Neutrophils Relative %: 90 %
Platelets: 577 10*3/uL — ABNORMAL HIGH (ref 150–400)
RBC: 3.14 MIL/uL — ABNORMAL LOW (ref 4.22–5.81)
RDW: 15.9 % — ABNORMAL HIGH (ref 11.5–15.5)
WBC: 20.6 10*3/uL — ABNORMAL HIGH (ref 4.0–10.5)
nRBC: 0 /100{WBCs}
nRBC: 0.1 % (ref 0.0–0.2)

## 2023-09-30 LAB — RENAL FUNCTION PANEL
Albumin: 2 g/dL — ABNORMAL LOW (ref 3.5–5.0)
Anion gap: 16 — ABNORMAL HIGH (ref 5–15)
BUN: 68 mg/dL — ABNORMAL HIGH (ref 8–23)
CO2: 22 mmol/L (ref 22–32)
Calcium: 7.8 mg/dL — ABNORMAL LOW (ref 8.9–10.3)
Chloride: 97 mmol/L — ABNORMAL LOW (ref 98–111)
Creatinine, Ser: 6.37 mg/dL — ABNORMAL HIGH (ref 0.61–1.24)
GFR, Estimated: 8 mL/min — ABNORMAL LOW (ref >60)
Glucose, Bld: 103 mg/dL — ABNORMAL HIGH (ref 70–99)
Phosphorus: 7.5 mg/dL — ABNORMAL HIGH (ref 2.5–4.6)
Potassium: 3.9 mmol/L (ref 3.5–5.1)
Sodium: 135 mmol/L (ref 135–145)

## 2023-09-30 LAB — MAGNESIUM: Magnesium: 2.3 mg/dL (ref 1.7–2.4)

## 2023-09-30 LAB — GLUCOSE, CAPILLARY
Glucose-Capillary: 101 mg/dL — ABNORMAL HIGH (ref 70–99)
Glucose-Capillary: 109 mg/dL — ABNORMAL HIGH (ref 70–99)
Glucose-Capillary: 116 mg/dL — ABNORMAL HIGH (ref 70–99)
Glucose-Capillary: 132 mg/dL — ABNORMAL HIGH (ref 70–99)
Glucose-Capillary: 97 mg/dL (ref 70–99)
Glucose-Capillary: 97 mg/dL (ref 70–99)

## 2023-09-30 LAB — PROCALCITONIN: Procalcitonin: 1.12 ng/mL

## 2023-09-30 MED ORDER — FINASTERIDE 5 MG PO TABS
5.0000 mg | ORAL_TABLET | Freq: Every day | ORAL | Status: DC
  Administered 2023-09-30 – 2023-10-04 (×5): 5 mg via ORAL
  Filled 2023-09-30 (×5): qty 1

## 2023-09-30 MED ORDER — SODIUM CHLORIDE 0.9 % IV SOLN
100.0000 mg | Freq: Once | INTRAVENOUS | Status: AC
  Administered 2023-09-30: 100 mg via INTRAVENOUS
  Filled 2023-09-30: qty 5

## 2023-09-30 MED ORDER — CHLORHEXIDINE GLUCONATE CLOTH 2 % EX PADS
6.0000 | MEDICATED_PAD | Freq: Every day | CUTANEOUS | Status: DC
  Administered 2023-10-01 – 2023-10-02 (×2): 6 via TOPICAL

## 2023-09-30 MED ORDER — FUROSEMIDE 10 MG/ML IJ SOLN
80.0000 mg | Freq: Once | INTRAMUSCULAR | Status: AC
  Administered 2023-09-30: 80 mg via INTRAVENOUS
  Filled 2023-09-30: qty 8

## 2023-09-30 MED ORDER — POLYSACCHARIDE IRON COMPLEX 150 MG PO CAPS
150.0000 mg | ORAL_CAPSULE | Freq: Every day | ORAL | Status: DC
  Administered 2023-09-30 – 2023-10-04 (×5): 150 mg via ORAL
  Filled 2023-09-30 (×5): qty 1

## 2023-09-30 NOTE — Progress Notes (Signed)
Progress Note    Eugenio Tien   NWG:[id number]  DOB: 1933/11/09  DOA: 2023/10/12     10 PCP: Royann Shivers, PA-C  Initial CC: initially abd pain  Hospital Course: Mr. Honts is an 89 yo male with PMH aflutter on Eliquis, COPD, HFrEF, HTN, HLD who initially presented to [facility] on Oct 12, 2023.  Has had increased urinary frequency. CXR with mild pulm edema. CT ABD/pelvis no acute abnormality; mild left hydroureteronephrosis; small bilateral pleural effusions. WBC/LA elevated. UA with nitrite and large leukocytes. Cultures obtained and given IV fluids. Patient started on Rocephin.  Due to worsening hypotension, he was transferred to the ICU.  He suffered PEA arrest with ROSC after 10 minutes.  He was then transferred to [facility]. He was able to be extubated on 09/22/2023.  He developed decreased UOP and ATN after his cardiac event.   Interval History:  No events overnight.  Urine output documented around 400 cc yesterday.  Still appears to be more concerning for appearance of hematuria rather than ATN appearance.  Discussing with urology today.   Assessment and Plan: * Septic shock (HCC)-resolved as of 09/24/2023 - due to UTI on admission - Ucx indeterminate; completing empiric course   Leukocytosis - Completed 5-day course of Rocephin for empiric UTI treatment -No growth on urine culture.  Blood culture was contaminated with staph epi -he has remained afebrile but with persistent leukocytosis -WBC had been downtrending but this morning has again gone back up.  Suspicion has still been this is reactive from PEA arrest and been slowly recovering -Risk of infection could be from underlying recent hemothorax and/or blood products in bladder or associated outlet obstruction - starting with repeat CXR - check PCT too - unclear what source if any to target; might consider starting broad coverage and monitoring; does still seem to have cough/mucus but no other  symptoms  Hematuria - Foley catheter exchanged on 09/26/2023 which then was followed with ~400 cc of what looked like real hematuria - suspect some trauma in setting of foley and anticoagulation - Continue catheter flushes scheduled (no clots reported so far) -Continue holding Eliquis and aspirin -Pelvic ultrasound performed 09/29/2023 showing decompressed bladder but still some concern for blood products although may be small amount after discussion with urology - Due to concern for some possible outlet obstruction which could be part of the cause of the ATN, asking for urology input to make sure the reduced output is related to ATN and not potential clots in bladder   Acute renal failure superimposed on stage 3b chronic kidney disease (HCC) - patient has history of CKD3b. Baseline creat ~ 1.9 - 2, eGFR~ 33-40 - at baseline on admission but then s/p cardiac arrest likely has developed ATN from hypoperfusion; other concern was for possible BOO as output picked up after foley exchange but was associate with large dark urine, ?hematuria but Hgb didn't drop much - nephrology continuing to follow - has been recommended for initiation of HD per nephrology - RIJ placed by IR on 9/27 for HD access   Pleural effusion on left-resolved as of 09/29/2023 - mod to large left effusion on CXR 9/25. In setting of ATN/volume overload would expect bilateral. With recent CPR does have risk for trauma/hemothorax notably with Hgb drop (which still could be multifactorial) - s/p thoracentesis on 9/26; noted to be bloody fluid and is exudative by criteria so likely this was hemothorax  Acute respiratory failure with hypoxia (HCC) - in setting of cardiac arrest;  extubated 9/23 - O2 requirement uptrended; now improved s/p throacentesis on 9/26 - continue weaning as able  PAF (paroxysmal atrial fibrillation) (HCC) - Holding Eliquis and aspirin until hematuria resolves -Continue amiodarone  Cardiac arrest  (HCC)-resolved as of 09/24/2023 - decompensated in setting of septic shock from UTI; found in PEA arrest with 10 minutes to ROSC - echo obtained 9/23: EF 60-65%, no RWMA, indeterminate diastology; small circumferential pericardial effusion  Normocytic anemia - Prior baseline appears to be around 11 to 12 g/dL -Some downtrend expected in setting of volume resuscitation and also worsened renal function plus presumed hemothorax  Asymptomatic carotid artery stenosis without infarction, right - s/p right endarterectomy with vascular surgery on 11/25/22 -On aspirin and Eliquis at home (see hematuria) -Continue Lipitor  COPD (chronic obstructive pulmonary disease) (HCC) - no s/s exac  HFrEF (heart failure with reduced ejection fraction) (HCC) - EF 60-65% no RWMA; indeterminate diastology - prior hx of EF 45% in June 2023 per notes   Hyperlipidemia - Continue Lipitor  Benign prostatic hyperplasia - Continue Flomax  Essential hypertension - Avoiding hypotension but will start PRN's in case of persistently elevated blood pressure   Old records reviewed in assessment of this patient  Antimicrobials: Rocephin 10-14-2023 >> 09/26/2023  DVT prophylaxis:  Eliquis on hold   Code Status:   Code Status: Full Code  Mobility Assessment (Last 72 Hours)     Mobility Assessment     Row Name 09/30/23 0800 09/29/23 2000 09/29/23 1200 09/29/23 0830 09/28/23 2152   Does patient have an order for bedrest or is patient medically unstable No - Continue assessment No - Continue assessment -- No - Continue assessment No - Continue assessment   What is the highest level of mobility based on the progressive mobility assessment? Level 2 (Chairfast) - Balance while sitting on edge of bed and cannot stand Level 5 (Walks with assist in room/hall) - Balance while stepping forward/back and can walk in room with assist - Complete Level 4 (Walks with assist in room) - Balance while marching in place and cannot  step forward and back - Complete Level 2 (Chairfast) - Balance while sitting on edge of bed and cannot stand Level 5 (Walks with assist in room/hall) - Balance while stepping forward/back and can walk in room with assist - Complete   Is the above level different from baseline mobility prior to current illness? Yes - Recommend PT order Yes - Recommend PT order -- Yes - Recommend PT order Yes - Recommend PT order    Row Name 09/28/23 0745 09/27/23 2207         Does patient have an order for bedrest or is patient medically unstable No - Continue assessment No - Continue assessment      What is the highest level of mobility based on the progressive mobility assessment? Level 2 (Chairfast) - Balance while sitting on edge of bed and cannot stand Level 2 (Chairfast) - Balance while sitting on edge of bed and cannot stand      Is the above level different from baseline mobility prior to current illness? Yes - Recommend PT order Yes - Recommend PT order               Barriers to discharge: none Disposition Plan:  Home Status is: Inpt  Objective: Blood pressure 117/73, pulse 87, temperature 97.7 F (36.5 C), temperature source Oral, resp. rate 18, height 5\' 10"  (1.778 m), weight 114.1 kg, SpO2 97%.  Examination:  Physical Exam  Constitutional:      General: He is not in acute distress.    Appearance: Normal appearance.  HENT:     Head: Normocephalic and atraumatic.     Mouth/Throat:     Mouth: Mucous membranes are moist.  Eyes:     Extraocular Movements: Extraocular movements intact.  Cardiovascular:     Rate and Rhythm: Normal rate and regular rhythm.  Pulmonary:     Effort: Pulmonary effort is normal. No respiratory distress.     Breath sounds: Normal breath sounds. No wheezing.  Abdominal:     General: Bowel sounds are normal. There is no distension.     Palpations: Abdomen is soft.     Tenderness: There is no abdominal tenderness.  Genitourinary:    Comments: Hematuria noted in  foley Musculoskeletal:        General: Normal range of motion.     Cervical back: Normal range of motion and neck supple.     Right lower leg: Edema (2+ pitting edema) present.     Left lower leg: Edema (2+ pitting edema) present.     Comments: No further UE edema  Skin:    General: Skin is warm and dry.  Neurological:     General: No focal deficit present.     Mental Status: He is alert.  Psychiatric:        Mood and Affect: Mood normal.        Behavior: Behavior normal.      Consultants:  Nephrology   Procedures:    Data Reviewed: Results for orders placed or performed during the hospital encounter of October 05, 2023 (from the past 24 hour(s))  Glucose, capillary     Status: Abnormal   Collection Time: 09/29/23  8:42 PM  Result Value Ref Range   Glucose-Capillary 112 (H) 70 - 99 mg/dL  Glucose, capillary     Status: Abnormal   Collection Time: 09/30/23 12:10 AM  Result Value Ref Range   Glucose-Capillary 101 (H) 70 - 99 mg/dL  CBC with Differential/Platelet     Status: Abnormal   Collection Time: 09/30/23  2:16 AM  Result Value Ref Range   WBC 20.6 (H) 4.0 - 10.5 K/uL   RBC 3.14 (L) 4.22 - 5.81 MIL/uL   Hemoglobin 9.0 (L) 13.0 - 17.0 g/dL   HCT 16.1 (L) 09.6 - 04.5 %   MCV 89.2 80.0 - 100.0 fL   MCH 28.7 26.0 - 34.0 pg   MCHC 32.1 30.0 - 36.0 g/dL   RDW 40.9 (H) 81.1 - 91.4 %   Platelets 577 (H) 150 - 400 K/uL   nRBC 0.1 0.0 - 0.2 %   Neutrophils Relative % 90 %   Neutro Abs 18.5 (H) 1.7 - 7.7 K/uL   Lymphocytes Relative 3 %   Lymphs Abs 0.6 (L) 0.7 - 4.0 K/uL   Monocytes Relative 5 %   Monocytes Absolute 1.0 0.1 - 1.0 K/uL   Eosinophils Relative 1 %   Eosinophils Absolute 0.2 0.0 - 0.5 K/uL   Basophils Relative 1 %   Basophils Absolute 0.2 (H) 0.0 - 0.1 K/uL   nRBC 0 0 /100 WBC   Abs Immature Granulocytes 0.00 0.00 - 0.07 K/uL   Polychromasia PRESENT   Renal function panel     Status: Abnormal   Collection Time: 09/30/23  2:16 AM  Result Value Ref Range    Sodium 135 135 - 145 mmol/L   Potassium 3.9 3.5 - 5.1 mmol/L   Chloride 97 (L)  98 - 111 mmol/L   CO2 22 22 - 32 mmol/L   Glucose, Bld 103 (H) 70 - 99 mg/dL   BUN 68 (H) 8 - 23 mg/dL   Creatinine, Ser 5.78 (H) 0.61 - 1.24 mg/dL   Calcium 7.8 (L) 8.9 - 10.3 mg/dL   Phosphorus 7.5 (H) 2.5 - 4.6 mg/dL   Albumin 2.0 (L) 3.5 - 5.0 g/dL   GFR, Estimated 8 (L) >60 mL/min   Anion gap 16 (H) 5 - 15  Magnesium     Status: None   Collection Time: 09/30/23  2:16 AM  Result Value Ref Range   Magnesium 2.3 1.7 - 2.4 mg/dL  Glucose, capillary     Status: None   Collection Time: 09/30/23  4:33 AM  Result Value Ref Range   Glucose-Capillary 97 70 - 99 mg/dL  Glucose, capillary     Status: None   Collection Time: 09/30/23  7:24 AM  Result Value Ref Range   Glucose-Capillary 97 70 - 99 mg/dL  Glucose, capillary     Status: Abnormal   Collection Time: 09/30/23 11:40 AM  Result Value Ref Range   Glucose-Capillary 109 (H) 70 - 99 mg/dL    I have reviewed pertinent nursing notes, vitals, labs, and images as necessary. I have ordered labwork to follow up on as indicated.  I have reviewed the last notes from staff over past 24 hours. I have discussed patient's care plan and test results with nursing staff, CM/SW, and other staff as appropriate.  Time spent: Greater than 50% of the 55 minute visit was spent in counseling/coordination of care for the patient as laid out in the A&P.   LOS: 10 days   Lewie Chamber, MD [facility] 09/30/2023, 2:03 PM

## 2023-09-30 NOTE — Consult Note (Signed)
Reason for Consult:Gross Hematuria After Catheter Placement / Prostatic Hypertrophy, Acute Renal Failure  Referring Physician: Lewie Chamber MD  Timothy Mata is an 89 y.o. male.   HPI:   1 - Gross Hematuria After Catheter Placement / Prostatic Hypertrophy - new gross hematuria during complex hospitalization for sepsis and ARF. Prostate 91gm with median by CT 08/2023. NO upper tract masses / stones.  2 - Acute Renal Failure - Baseline Cr 1.3 2023. ARF with anurtia after PEA arrest / hypoperfusion / temporary dialisys 08/2023. NO sig hydro on imaging x several.   PMH sig for arrythmia / eliquus. Denis h/o surgery. 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).   Today "Timothy Mata" is seen in consultation for above, specifically to rule out clot retention.   Past Medical History:  Diagnosis Date   Atrial flutter (HCC)    a. s/p DCCV in 03/2022 b. repeat EKG at follow-up from DCCV showing atrial fibrillation   BPH (benign prostatic hyperplasia)    COPD (chronic obstructive pulmonary disease) (HCC)    Dysrhythmia    Gout    "left big toe"   History of kidney stones    HOH (hard of hearing)    Hypercholesterolemia    Hypertension    Preretinal fibrosis, left eye    Spinal headache 12/30/1972    Past Surgical History:  Procedure Laterality Date   25 GAUGE PARS PLANA VITRECTOMY WITH 20 GAUGE MVR PORT Left 07/18/2015   Procedure: 25 GAUGE PARS PLANA VITRECTOMY WITH 20 GAUGE MVR PORT;  Surgeon: Sherrie George, MD;  Location: [facility] OR;  Service: Ophthalmology;  Laterality: Left;   AIR/FLUID EXCHANGE Left 07/18/2015   Procedure: AIR/FLUID EXCHANGE;  Surgeon: Sherrie George, MD;  Location: [facility] OR;  Service: Ophthalmology;  Laterality: Left;   BACK SURGERY     CARDIOVERSION N/A 04/26/2022   Procedure: CARDIOVERSION;  Surgeon: Jonelle Sidle, MD;  Location: [facility] ORS;  Service: Cardiovascular;  Laterality: N/A;   ENDARTERECTOMY Right 11/25/2022   Procedure: ENDARTERECTOMY  CAROTID WITH PATCH ANGIOPLASTY;  Surgeon: Cephus Shelling, MD;  Location: [facility] OR;  Service: Vascular;  Laterality: Right;   EYE SURGERY     FOOT FRACTURE SURGERY Right 1981   IR FLUORO GUIDE CV LINE RIGHT  09/26/2023   IR THORACENTESIS ASP PLEURAL SPACE W/IMG GUIDE  09/25/2023   IR US GUIDE VASC ACCESS RIGHT  09/26/2023   LASER PHOTO ABLATION Left 07/18/2015   Procedure: LASER PHOTO ABLATION;  Surgeon: Sherrie George, MD;  Location: [facility] OR;  Service: Ophthalmology;  Laterality: Left;  head scope    LUMBAR DISC SURGERY  1974   MEMBRANE PEEL Left 07/18/2015   Procedure: MEMBRANE PEEL;  Surgeon: Sherrie George, MD;  Location: [facility] OR;  Service: Ophthalmology;  Laterality: Left;   MULTIPLE TOOTH EXTRACTIONS  1990's   PARS PLANA VITRECTOMY Left 07/18/2015   PATCH ANGIOPLASTY Right 11/25/2022   Procedure: PATCH ANGIOPLASTY WITH 1 cm X 6 cm XENOSURE BIOLOGIC PATCH;  Surgeon: Cephus Shelling, MD;  Location: [facility] OR;  Service: Vascular;  Laterality: Right;    Family History  Problem Relation Age of Onset   Cancer Father     Social History:  reports that he has quit smoking. His smoking use included cigarettes. He has a 32 pack-year smoking history. He has never used smokeless tobacco. He reports that he does not drink alcohol and does not use drugs.  Allergies: No Known Allergies  Medications: I have reviewed the  patient's current medications.  Results for orders placed or performed during the hospital encounter of 14-Oct-2023 (from the past 48 hour(s))  Glucose, capillary     Status: Abnormal   Collection Time: 09/28/23  4:22 PM  Result Value Ref Range   Glucose-Capillary 118 (H) 70 - 99 mg/dL    Comment: Glucose reference range applies only to samples taken after fasting for at least 8 hours.  Glucose, capillary     Status: Abnormal   Collection Time: 09/28/23  8:47 PM  Result Value Ref Range   Glucose-Capillary 224 (H) 70 - 99 mg/dL    Comment: Glucose reference range applies only to  samples taken after fasting for at least 8 hours.  Glucose, capillary     Status: Abnormal   Collection Time: 09/29/23 12:23 AM  Result Value Ref Range   Glucose-Capillary 126 (H) 70 - 99 mg/dL    Comment: Glucose reference range applies only to samples taken after fasting for at least 8 hours.  Renal function panel     Status: Abnormal   Collection Time: 09/29/23  4:13 AM  Result Value Ref Range   Sodium 137 135 - 145 mmol/L   Potassium 3.8 3.5 - 5.1 mmol/L   Chloride 103 98 - 111 mmol/L   CO2 19 (L) 22 - 32 mmol/L   Glucose, Bld 103 (H) 70 - 99 mg/dL    Comment: Glucose reference range applies only to samples taken after fasting for at least 8 hours.   BUN 97 (H) 8 - 23 mg/dL   Creatinine, Ser 1.61 (H) 0.61 - 1.24 mg/dL   Calcium 8.0 (L) 8.9 - 10.3 mg/dL   Phosphorus 9.1 (H) 2.5 - 4.6 mg/dL   Albumin 2.0 (L) 3.5 - 5.0 g/dL   GFR, Estimated 6 (L) >60 mL/min    Comment: (NOTE) Calculated using the CKD-EPI Creatinine Equation (2021)    Anion gap 15 5 - 15    Comment: ELECTROLYTES REPEATED TO VERIFY Performed at [facility], [address], [place], [place] [address]   Magnesium     Status: Abnormal   Collection Time: 09/29/23  4:13 AM  Result Value Ref Range   Magnesium 2.8 (H) 1.7 - 2.4 mg/dL    Comment: Performed at [facility], [address], [place], [place] [address]  CBC with Differential/Platelet     Status: Abnormal   Collection Time: 09/29/23  4:13 AM  Result Value Ref Range   WBC 17.5 (H) 4.0 - 10.5 K/uL   RBC 3.01 (L) 4.22 - 5.81 MIL/uL   Hemoglobin 8.8 (L) 13.0 - 17.0 g/dL   HCT 11.9 (L) 14.7 - 82.9 %   MCV 92.4 80.0 - 100.0 fL   MCH 29.2 26.0 - 34.0 pg   MCHC 31.7 30.0 - 36.0 g/dL   RDW 56.2 (H) 13.0 - 86.5 %   Platelets 534 (H) 150 - 400 K/uL   nRBC 0.1 0.0 - 0.2 %   Neutrophils Relative % 79 %   Neutro Abs 13.6 (H) 1.7 - 7.7 K/uL   Lymphocytes Relative 5 %   Lymphs Abs 1.0 0.7 - 4.0 K/uL   Monocytes Relative 10 %   Monocytes Absolute 1.8  (H) 0.1 - 1.0 K/uL   Eosinophils Relative 1 %   Eosinophils Absolute 0.2 0.0 - 0.5 K/uL   Basophils Relative 0 %   Basophils Absolute 0.1 0.0 - 0.1 K/uL   Immature Granulocytes 5 %   Abs Immature Granulocytes 0.92 (  H) 0.00 - 0.07 K/uL    Comment: Performed at [facility], [address] [place], [place] [address]  Glucose, capillary     Status: Abnormal   Collection Time: 09/29/23  7:57 AM  Result Value Ref Range   Glucose-Capillary 135 (H) 70 - 99 mg/dL    Comment: Glucose reference range applies only to samples taken after fasting for at least 8 hours.  Iron and TIBC     Status: Abnormal   Collection Time: 09/29/23  8:09 AM  Result Value Ref Range   Iron 20 (L) 45 - 182 ug/dL   TIBC 604 (L) 540 - 981 ug/dL   Saturation Ratios 10 (L) 17.9 - 39.5 %   UIBC 186 ug/dL    Comment: Performed at [facility], [address], [place], [place] [address]  Ferritin     Status: Abnormal   Collection Time: 09/29/23  8:09 AM  Result Value Ref Range   Ferritin 387 (H) 24 - 336 ng/mL    Comment: Performed at [facility], [address] [place], [place] [address]  Glucose, capillary     Status: Abnormal   Collection Time: 09/29/23  8:42 PM  Result Value Ref Range   Glucose-Capillary 112 (H) 70 - 99 mg/dL    Comment: Glucose reference range applies only to samples taken after fasting for at least 8 hours.  Glucose, capillary     Status: Abnormal   Collection Time: 09/30/23 12:10 AM  Result Value Ref Range   Glucose-Capillary 101 (H) 70 - 99 mg/dL    Comment: Glucose reference range applies only to samples taken after fasting for at least 8 hours.  CBC with Differential/Platelet     Status: Abnormal   Collection Time: 09/30/23  2:16 AM  Result Value Ref Range   WBC 20.6 (H) 4.0 - 10.5 K/uL   RBC 3.14 (L) 4.22 - 5.81 MIL/uL   Hemoglobin 9.0 (L) 13.0 - 17.0 g/dL   HCT 21.3 (L) 08.6 - 57.8 %   MCV 89.2 80.0 - 100.0 fL   MCH 28.7 26.0 - 34.0 pg   MCHC 32.1 30.0 - 36.0  g/dL   RDW 46.9 (H) 62.9 - 52.8 %   Platelets 577 (H) 150 - 400 K/uL   nRBC 0.1 0.0 - 0.2 %   Neutrophils Relative % 90 %   Neutro Abs 18.5 (H) 1.7 - 7.7 K/uL   Lymphocytes Relative 3 %   Lymphs Abs 0.6 (L) 0.7 - 4.0 K/uL   Monocytes Relative 5 %   Monocytes Absolute 1.0 0.1 - 1.0 K/uL   Eosinophils Relative 1 %   Eosinophils Absolute 0.2 0.0 - 0.5 K/uL   Basophils Relative 1 %   Basophils Absolute 0.2 (H) 0.0 - 0.1 K/uL   nRBC 0 0 /100 WBC   Abs Immature Granulocytes 0.00 0.00 - 0.07 K/uL   Polychromasia PRESENT     Comment: Performed at [facility], [address] [place], [place] [address]  Renal function panel     Status: Abnormal   Collection Time: 09/30/23  2:16 AM  Result Value Ref Range   Sodium 135 135 - 145 mmol/L   Potassium 3.9 3.5 - 5.1 mmol/L   Chloride 97 (L) 98 - 111 mmol/L   CO2 22 22 - 32 mmol/L   Glucose, Bld 103 (H) 70 - 99 mg/dL    Comment: Glucose reference range applies only to samples taken after fasting for  at least 8 hours.   BUN 68 (H) 8 - 23 mg/dL   Creatinine, Ser 1.61 (H) 0.61 - 1.24 mg/dL   Calcium 7.8 (L) 8.9 - 10.3 mg/dL   Phosphorus 7.5 (H) 2.5 - 4.6 mg/dL   Albumin 2.0 (L) 3.5 - 5.0 g/dL   GFR, Estimated 8 (L) >60 mL/min    Comment: (NOTE) Calculated using the CKD-EPI Creatinine Equation (2021)    Anion gap 16 (H) 5 - 15    Comment: Performed at [facility], [address] [place], [place] [address]  Magnesium     Status: None   Collection Time: 09/30/23  2:16 AM  Result Value Ref Range   Magnesium 2.3 1.7 - 2.4 mg/dL    Comment: Performed at [facility], [address], [place], [place] [address]  Glucose, capillary     Status: None   Collection Time: 09/30/23  4:33 AM  Result Value Ref Range   Glucose-Capillary 97 70 - 99 mg/dL    Comment: Glucose reference range applies only to samples taken after fasting for at least 8 hours.  Glucose, capillary     Status: None   Collection Time: 09/30/23  7:24 AM  Result  Value Ref Range   Glucose-Capillary 97 70 - 99 mg/dL    Comment: Glucose reference range applies only to samples taken after fasting for at least 8 hours.  Glucose, capillary     Status: Abnormal   Collection Time: 09/30/23 11:40 AM  Result Value Ref Range   Glucose-Capillary 109 (H) 70 - 99 mg/dL    Comment: Glucose reference range applies only to samples taken after fasting for at least 8 hours.    US PELVIS LIMITED (TRANSABDOMINAL ONLY)  Result Date: 09/30/2023 CLINICAL DATA:  Hematuria EXAM: LIMITED ULTRASOUND OF PELVIS TECHNIQUE: Limited transabdominal ultrasound examination of the pelvis was performed. COMPARISON:  CT 09/05/2023 FINDINGS: Foley catheter in place. Bladder decompressed around the Foley catheter and difficult to visualize and evaluate. Complex hypoechoic material around the Foley catheter balloon may reflect blood products within the bladder lumen. IMPRESSION: Foley catheter in place with bladder decompressed and difficult to evaluate. Electronically Signed   By: Charlett Nose M.D.   On: 09/30/2023 01:51    Review of Systems  Constitutional:  Positive for fatigue.  All other systems reviewed and are negative.  Blood pressure 117/73, pulse 87, temperature 97.7 F (36.5 C), temperature source Oral, resp. rate 18, height 5\' 10"  (1.778 m), weight 114.1 kg, SpO2 97%. Physical Exam Vitals reviewed.  Constitutional:      Comments: Pleasant, AOx3, but frail obese  HENT:     Head: Normocephalic.  Cardiovascular:     Rate and Rhythm: Normal rate.     Comments: Rt sided  central line in place w/o site reaction.  Pulmonary:     Effort: Pulmonary effort is normal.  Abdominal:     Comments: Moderate truncal obesity.   Genitourinary:    Penis: Normal.      Comments: Small bore catheter in situ with light pink / thin urine and scant debris (nonfoul).  Neurological:     General: No focal deficit present.   Soft LE edema to thigh.   BEDSIDE BLADDER IRRIGATION: Using  aseptic technique bladder irrigated with water in 60cc aliquots to clear. Scant old clot removed. All irrigation quantitiative, no clot retention.   Assessment/Plan:  1 - Gross Hematuria After Catheter Placement / Prostatic Hypertrophy - this is most certainly from  modest catheter trauma / BPH / prostate bleeding in setting of blood thinners. Start finasteride and continue at DC as best medical thearpy to minimize. He is not in retention. Blood appears old and no active bleeding.   I fell OK to DC catheter when not needed for accurate UOP monitoring.   2 - Acute Renal Failure - Pre-renal from hypoperfusion. NO significatn hydro / stones on serial imaging to suggest significant obscturctive component. Nephrol team management very commendable.  Please call me directly with questions.    Loletta Parish. 09/30/2023, 12:32 PM

## 2023-09-30 NOTE — Plan of Care (Signed)
  Problem: Education: Goal: Knowledge of General Education information will improve Description: Including pain rating scale, medication(s)/side effects and non-pharmacologic comfort measures Outcome: Progressing   Problem: Health Behavior/Discharge Planning: Goal: Ability to manage health-related needs will improve Outcome: Progressing   Problem: Clinical Measurements: Goal: Ability to maintain clinical measurements within normal limits will improve Outcome: Progressing Goal: Will remain free from infection Outcome: Progressing Goal: Diagnostic test results will improve Outcome: Progressing Goal: Respiratory complications will improve Outcome: Progressing Goal: Cardiovascular complication will be avoided Outcome: Progressing   Problem: Activity: Goal: Risk for activity intolerance will decrease Outcome: Progressing   Problem: Coping: Goal: Level of anxiety will decrease Outcome: Progressing   Problem: Elimination: Goal: Will not experience complications related to bowel motility Outcome: Progressing   Problem: Pain Managment: Goal: General experience of comfort will improve Outcome: Progressing   Problem: Safety: Goal: Ability to remain free from injury will improve Outcome: Progressing   Problem: Skin Integrity: Goal: Risk for impaired skin integrity will decrease Outcome: Progressing   Problem: Safety: Goal: Non-violent Restraint(s) Outcome: Progressing   Problem: Education: Goal: Ability to describe self-care measures that may prevent or decrease complications (Diabetes Survival Skills Education) will improve Outcome: Progressing Goal: Individualized Educational Video(s) Outcome: Progressing   Problem: Coping: Goal: Ability to adjust to condition or change in health will improve Outcome: Progressing   Problem: Fluid Volume: Goal: Ability to maintain a balanced intake and output will improve Outcome: Progressing   Problem: Health Behavior/Discharge  Planning: Goal: Ability to identify and utilize available resources and services will improve Outcome: Progressing Goal: Ability to manage health-related needs will improve Outcome: Progressing   Problem: Metabolic: Goal: Ability to maintain appropriate glucose levels will improve Outcome: Progressing   Problem: Nutritional: Goal: Maintenance of adequate nutrition will improve Outcome: Progressing Goal: Progress toward achieving an optimal weight will improve Outcome: Progressing   Problem: Skin Integrity: Goal: Risk for impaired skin integrity will decrease Outcome: Progressing   Problem: Tissue Perfusion: Goal: Adequacy of tissue perfusion will improve Outcome: Progressing

## 2023-09-30 NOTE — Progress Notes (Signed)
Physical Therapy Treatment Patient Details Name: Timothy Mata MRN: [id number] DOB: 01-Dec-1933 Today's Date: 09/30/2023   History of Present Illness 89 y.o. male admitted at [facility] 9/21 with abdominal pain, UTI, sepsis.  9/22 cardiac arrest,  PEA 8-10 min with transfer to [facility]. Intubation 9/22-9/23. PMHx: Aflutter on Eliquis and amiodarone, BPH, COPD, HTN, gout    PT Comments  Pt greeted resting in bed and agreeable to session with encouragement. Pt with poor progress towards goals this session as pt with increased fatigue and decreased activity tolerance. Pt able to come to sitting EOB with light min A to elevate trunk with SOB appreciate once pt seated up EOB needing cues for breathing techniques. SpO2 >94% throughout on 2L O2. Pt demonstrating short gait bout in room with distance limited to pt stated tolerance and pt declining second bout due to fatigue. Pt agreeable to time up in chair at end of session and verbalizing understanding of importance of continued mobility to increase activity tolerance and improve cardiopulmonary endurance. Pt continues to benefit from skilled PT services to progress toward functional mobility goals.     If plan is discharge home, recommend the following: Assistance with cooking/housework;A lot of help with bathing/dressing/bathroom;A little help with bathing/dressing/bathroom;Assist for transportation   Can travel by private vehicle        Equipment Recommendations  None recommended by PT    Recommendations for Other Services       Precautions / Restrictions Precautions Precautions: Fall;Other (comment) Precaution Comments: watch sats, incontinent stool Restrictions Weight Bearing Restrictions: No     Mobility  Bed Mobility Overal bed mobility: Needs Assistance Bed Mobility: Supine to Sit, Sit to Supine     Supine to sit: Min assist     General bed mobility comments: increased time and effort needed light min A to elevate trunk, SOB noted once  sitting    Transfers Overall transfer level: Needs assistance Equipment used: Rolling walker (2 wheels) Transfers: Sit to/from Stand Sit to Stand: Contact guard assist           General transfer comment: CGA to stand from EOB    Ambulation/Gait Ambulation/Gait assistance: Contact guard assist Gait Distance (Feet): 30 Feet Assistive device: Rolling walker (2 wheels) Gait Pattern/deviations: Step-through pattern, Decreased stride length, Trunk flexed Gait velocity: decr     General Gait Details: cues for posture, proximity to RW, distance limited to pt stated tolerance despite encouragement to continue for second bout after seated rest   Stairs             Wheelchair Mobility     Tilt Bed    Modified Rankin (Stroke Patients Only)       Balance Overall balance assessment: Needs assistance Sitting-balance support: No upper extremity supported, Feet supported Sitting balance-Leahy Scale: Good     Standing balance support: Bilateral upper extremity supported, During functional activity, Reliant on assistive device for balance Standing balance-Leahy Scale: Poor Standing balance comment: RW for balance                            Cognition Arousal: Alert Behavior During Therapy: Flat affect Overall Cognitive Status: Within Functional Limits for tasks assessed                                 General Comments: needed encouragement to participate        Exercises  General Comments General comments (skin integrity, edema, etc.): VSS on supplemetnal O2 throughout      Pertinent Vitals/Pain Pain Assessment Pain Assessment: Faces Faces Pain Scale: Hurts a little bit Pain Location: general Pain Descriptors / Indicators: Grimacing, Guarding Pain Intervention(s): Monitored during session, Limited activity within patient's tolerance    Home Living                          Prior Function            PT Goals  (current goals can now be found in the care plan section) Acute Rehab PT Goals Patient Stated Goal: return home PT Goal Formulation: With patient/family Time For Goal Achievement: 10/07/23 Progress towards PT goals: Not progressing toward goals - comment (increased fatigue)    Frequency    Min 1X/week      PT Plan      Co-evaluation              AM-PAC PT "6 Clicks" Mobility   Outcome Measure  Help needed turning from your back to your side while in a flat bed without using bedrails?: A Little Help needed moving from lying on your back to sitting on the side of a flat bed without using bedrails?: A Little Help needed moving to and from a bed to a chair (including a wheelchair)?: A Little Help needed standing up from a chair using your arms (e.g., wheelchair or bedside chair)?: A Little Help needed to walk in hospital room?: A Little Help needed climbing 3-5 steps with a railing? : Total 6 Click Score: 16    End of Session   Activity Tolerance: Patient limited by fatigue Patient left: in chair;with call bell/phone within reach Nurse Communication: Mobility status PT Visit Diagnosis: Other abnormalities of gait and mobility (R26.89);History of falling (Z91.81)     Time: 5366-4403 PT Time Calculation (min) (ACUTE ONLY): 24 min  Charges:    $Therapeutic Activity: 23-37 mins PT General Charges $$ ACUTE PT VISIT: 1 Visit                     Josuel Koeppen R. PTA Acute Rehabilitation Services Office: [phone]   Catalina Antigua 09/30/2023, 3:45 PM

## 2023-09-30 NOTE — Progress Notes (Signed)
Patient ID: Timothy Mata, male   DOB: [birth year], 89 y.o.   MRN: [id number]   Subjective:  He had 400 mL uop over 9/30.  Last HD on 9/30 with 0.5 kg UF.  He feels a little better today    Review of systems:   Denies shortness of breath or chest pain  Denies n/v     O:BP 117/73 (BP Location: Left Arm)   Pulse 87   Temp 97.7 F (36.5 C) (Oral)   Resp 18   Ht 5\' 10"  (1.778 m)   Wt 113.3 kg Comment: bed  SpO2 96%   BMI 35.84 kg/m   Intake/Output Summary (Last 24 hours) at 09/30/2023 0732 Last data filed at 09/29/2023 2000 Gross per 24 hour  Intake 540 ml  Output 900 ml  Net -360 ml   Intake/Output: I/O last 3 completed shifts: In: 640 [P.O.:240; Other:400] Out: 1250 [Urine:750; Other:500]  Intake/Output this shift:  No intake/output data recorded. Weight change: 2.4 kg  General elderly male in bed in no acute distress   HEENT normocephalic atraumatic extraocular movements intact sclera anicteric; multiple lesions of his lips   Neck supple trachea midline Lungs clear to auscultation bilaterally normal work of breathing at rest on 2 liters oxygen Heart S1S2 no rub Abdomen soft nontender mildly distended Extremities 1+ edema lower extremities Psych normal mood and affect Neuro - alert and oriented x3 provides basic hx and follows commands  GU - foley catheter in place, dark urine     Recent Labs  Lab 09/24/23 0406 09/25/23 0336 09/26/23 0418 09/27/23 0838 09/28/23 0319 09/29/23 0413 09/30/23 0216  NA 135 136 135 137 137 137 135  K 4.0 4.4 4.3 4.1 3.8 3.8 3.9  CL 104 106 105 101 101 103 97*  CO2 18* 16* 16* 17* 21* 19* 22  GLUCOSE 108* 93 94 90 104* 103* 103*  BUN 72* 82* 95* 81* 86* 97* 68*  CREATININE 6.05* 7.36* 8.50* 7.24* 7.81* 8.20* 6.37*  ALBUMIN 1.6* 1.8* 1.6* 1.6* 2.1* 2.0* 2.0*  CALCIUM 7.7* 7.8* 7.8* 7.8* 7.9* 8.0* 7.8*  PHOS 6.4* 7.8* 9.7* 8.4* 8.9* 9.1* 7.5*  AST 37  --   --   --   --   --   --   ALT 55*  --   --   --   --   --   --    Liver  Function Tests: Recent Labs  Lab 09/24/23 0406 09/25/23 0336 09/28/23 0319 09/29/23 0413 09/30/23 0216  AST 37  --   --   --   --   ALT 55*  --   --   --   --   ALKPHOS 93  --   --   --   --   BILITOT 0.6  --   --   --   --   PROT 5.4*  --   --   --   --   ALBUMIN 1.6*   < > 2.1* 2.0* 2.0*   < > = values in this interval not displayed.    No results for input(s): "AMMONIA" in the last 168 hours. CBC: Recent Labs  Lab 09/26/23 0418 09/26/23 0418 09/27/23 0838 09/28/23 0319 09/29/23 0413 09/30/23 0216  WBC 23.7*  --  19.7* 16.7* 17.5* 20.6*  NEUTROABS  --    < > 15.7* 12.9* 13.6* 18.5*  HGB 9.6*  --  9.2* 8.3* 8.8* 9.0*  HCT 30.5*  --  28.6* 26.0* 27.8* 28.0*  MCV  93.0  --  90.2 90.6 92.4 89.2  PLT 402*  --  468* 488* 534* 577*   < > = values in this interval not displayed.   Cardiac Enzymes: No results for input(s): "CKTOTAL", "CKMB", "CKMBINDEX", "TROPONINI" in the last 168 hours. CBG: Recent Labs  Lab 09/29/23 0757 09/29/23 2042 09/30/23 0010 09/30/23 0433 09/30/23 0724  GLUCAP 135* 112* 101* 97 97    Studies/Results: US PELVIS LIMITED (TRANSABDOMINAL ONLY)  Result Date: 09/30/2023 CLINICAL DATA:  Hematuria EXAM: LIMITED ULTRASOUND OF PELVIS TECHNIQUE: Limited transabdominal ultrasound examination of the pelvis was performed. COMPARISON:  CT 09-30-2023 FINDINGS: Foley catheter in place. Bladder decompressed around the Foley catheter and difficult to visualize and evaluate. Complex hypoechoic material around the Foley catheter balloon may reflect blood products within the bladder lumen. IMPRESSION: Foley catheter in place with bladder decompressed and difficult to evaluate. Electronically Signed   By: Charlett Nose M.D.   On: 09/30/2023 01:51    amiodarone  200 mg Oral Daily   atorvastatin  80 mg Oral q1800   Chlorhexidine Gluconate Cloth  6 each Topical Daily   fluticasone furoate-vilanterol  1 puff Inhalation Daily   levothyroxine  75 mcg Oral Q0600    lidocaine  20 mL Infiltration Once   mouth rinse  15 mL Mouth Rinse 4 times per day   polyethylene glycol  17 g Oral Daily   sevelamer carbonate  1,600 mg Oral TID WC   tamsulosin  0.4 mg Oral QPC supper    BMET    Component Value Date/Time   NA 135 09/30/2023 0216   K 3.9 09/30/2023 0216   CL 97 (L) 09/30/2023 0216   CO2 22 09/30/2023 0216   GLUCOSE 103 (H) 09/30/2023 0216   BUN 68 (H) 09/30/2023 0216   CREATININE 6.37 (H) 09/30/2023 0216   CALCIUM 7.8 (L) 09/30/2023 0216   GFRNONAA 8 (L) 09/30/2023 0216   GFRAA 47 (L) 07/18/2015 0939   CBC    Component Value Date/Time   WBC 20.6 (H) 09/30/2023 0216   RBC 3.14 (L) 09/30/2023 0216   HGB 9.0 (L) 09/30/2023 0216   HCT 28.0 (L) 09/30/2023 0216   PLT 577 (H) 09/30/2023 0216   MCV 89.2 09/30/2023 0216   MCH 28.7 09/30/2023 0216   MCHC 32.1 09/30/2023 0216   RDW 15.9 (H) 09/30/2023 0216   LYMPHSABS 0.6 (L) 09/30/2023 0216   MONOABS 1.0 09/30/2023 0216   EOSABS 0.2 09/30/2023 0216   BASOSABS 0.2 (H) 09/30/2023 0216    Assessment/Plan:  AKI/CKD Stage IIIb, oliguric - presumably ischemic ATN in setting of PEA arrest.  Blood pressures have improved.  Persistently worsening kidney function with low urine output.  Temp cath placed by IR on 9/28 (given concern for infection). Had first HD on 9/28. Possibly had some obstructive component and mild left hydro so foley placed 9/28. Urine very dark possibly some blood/ATN.   HD again on 10/2, Wednesday.  Assess dialysis needs daily Lasix 80 mg IV once today Leukocytosis; IR was called to place tunneled dialysis catheter this week per charting - will need to check to see if on schedule.  Note that right now with continued leukocytosis Continue strict ins/outs  PEA arrest and septic shock - shock resolved. Abx per primary team.  9/22 blood cx negative  Acute respiratory failure post arrest with underlying COPD - weaned to room air.   Afib/flutter - on amiodarone per primary team  CKD  stage 3b - baseline Cr 1.9 -  2.4 Normocytic anemia - likely combination of AKI/CKD and anemia of critical illness.  Transfuse for Hgb <7. Anticipate need to start ESA this week.  Iron deficiency - will give venofer x 1 dose for now and start oral iron as well HFrEF - EF 60% and RV function low normal.  Per charting poor response to lasix. HD as above to optimize as needed Hyperphosphatemia: have sevelamer dose.  HD as above  Disposition - continue inpatient monitoring.  He has just been initiated on HD for AKI and we are monitoring for the length of need  Estanislado Emms, MD 7:58 AM 09/30/2023

## 2023-09-30 NOTE — Assessment & Plan Note (Signed)
-   Completed 5-day course of Rocephin for empiric UTI treatment -No growth on urine culture.  Blood culture was contaminated with staph epi -he has remained afebrile but with persistent leukocytosis -WBC had been downtrending but this morning has again gone back up.  Suspicion has still been this is reactive from PEA arrest and been slowly recovering -Risk of infection could be from underlying recent hemothorax and/or blood products in bladder or associated outlet obstruction - starting with repeat CXR - check PCT too - unclear what source if any to target; might consider starting broad coverage and monitoring; does still seem to have cough/mucus but no other symptoms

## 2023-09-30 DEATH — deceased

## 2023-10-01 DIAGNOSIS — I5020 Unspecified systolic (congestive) heart failure: Secondary | ICD-10-CM | POA: Diagnosis not present

## 2023-10-01 DIAGNOSIS — N170 Acute kidney failure with tubular necrosis: Secondary | ICD-10-CM | POA: Diagnosis not present

## 2023-10-01 DIAGNOSIS — J449 Chronic obstructive pulmonary disease, unspecified: Secondary | ICD-10-CM

## 2023-10-01 DIAGNOSIS — A419 Sepsis, unspecified organism: Secondary | ICD-10-CM | POA: Diagnosis not present

## 2023-10-01 DIAGNOSIS — Z515 Encounter for palliative care: Secondary | ICD-10-CM

## 2023-10-01 DIAGNOSIS — R531 Weakness: Secondary | ICD-10-CM

## 2023-10-01 DIAGNOSIS — R6521 Severe sepsis with septic shock: Secondary | ICD-10-CM | POA: Diagnosis not present

## 2023-10-01 LAB — RENAL FUNCTION PANEL
Albumin: 2 g/dL — ABNORMAL LOW (ref 3.5–5.0)
Anion gap: 14 (ref 5–15)
BUN: 79 mg/dL — ABNORMAL HIGH (ref 8–23)
CO2: 22 mmol/L (ref 22–32)
Calcium: 8 mg/dL — ABNORMAL LOW (ref 8.9–10.3)
Chloride: 100 mmol/L (ref 98–111)
Creatinine, Ser: 6.99 mg/dL — ABNORMAL HIGH (ref 0.61–1.24)
GFR, Estimated: 7 mL/min — ABNORMAL LOW (ref >60)
Glucose, Bld: 91 mg/dL (ref 70–99)
Phosphorus: 8.9 mg/dL — ABNORMAL HIGH (ref 2.5–4.6)
Potassium: 4 mmol/L (ref 3.5–5.1)
Sodium: 136 mmol/L (ref 135–145)

## 2023-10-01 LAB — CBC WITH DIFFERENTIAL/PLATELET
Abs Immature Granulocytes: 1.3 10*3/uL — ABNORMAL HIGH (ref 0.00–0.07)
Basophils Absolute: 0.1 10*3/uL (ref 0.0–0.1)
Basophils Relative: 0 %
Eosinophils Absolute: 0.2 10*3/uL (ref 0.0–0.5)
Eosinophils Relative: 1 %
HCT: 28.9 % — ABNORMAL LOW (ref 39.0–52.0)
Hemoglobin: 9.3 g/dL — ABNORMAL LOW (ref 13.0–17.0)
Immature Granulocytes: 6 %
Lymphocytes Relative: 4 %
Lymphs Abs: 1 10*3/uL (ref 0.7–4.0)
MCH: 29.5 pg (ref 26.0–34.0)
MCHC: 32.2 g/dL (ref 30.0–36.0)
MCV: 91.7 fL (ref 80.0–100.0)
Monocytes Absolute: 2 10*3/uL — ABNORMAL HIGH (ref 0.1–1.0)
Monocytes Relative: 9 %
Neutro Abs: 17.8 10*3/uL — ABNORMAL HIGH (ref 1.7–7.7)
Neutrophils Relative %: 80 %
Platelets: 617 10*3/uL — ABNORMAL HIGH (ref 150–400)
RBC: 3.15 MIL/uL — ABNORMAL LOW (ref 4.22–5.81)
RDW: 16 % — ABNORMAL HIGH (ref 11.5–15.5)
WBC: 22.3 10*3/uL — ABNORMAL HIGH (ref 4.0–10.5)
nRBC: 0.1 % (ref 0.0–0.2)

## 2023-10-01 LAB — MISC LABCORP TEST (SEND OUT): Labcorp test code: 9985

## 2023-10-01 LAB — MAGNESIUM: Magnesium: 2.6 mg/dL — ABNORMAL HIGH (ref 1.7–2.4)

## 2023-10-01 MED ORDER — ANTICOAGULANT SODIUM CITRATE 4% (200MG/5ML) IV SOLN: 5.0000 mL | Status: DC | PRN

## 2023-10-01 MED ORDER — FUROSEMIDE 10 MG/ML IJ SOLN
80.0000 mg | Freq: Once | INTRAMUSCULAR | Status: AC
  Administered 2023-10-02: 80 mg via INTRAVENOUS
  Filled 2023-10-01 (×2): qty 8

## 2023-10-01 MED ORDER — LIDOCAINE HCL (PF) 1 % IJ SOLN: 5.0000 mL | INTRAMUSCULAR | Status: DC | PRN

## 2023-10-01 MED ORDER — HEPARIN SODIUM (PORCINE) 1000 UNIT/ML DIALYSIS: 1000.0000 [IU] | INTRAMUSCULAR | Status: DC | PRN

## 2023-10-01 MED ORDER — LIDOCAINE-PRILOCAINE 2.5-2.5 % EX CREA: 1.0000 | TOPICAL_CREAM | CUTANEOUS | Status: DC | PRN

## 2023-10-01 MED ORDER — HEPARIN SODIUM (PORCINE) 1000 UNIT/ML DIALYSIS
2800.0000 [IU] | INTRAMUSCULAR | Status: DC | PRN
  Administered 2023-10-01: 2800 [IU]
  Filled 2023-10-01: qty 3

## 2023-10-01 MED ORDER — FUROSEMIDE 10 MG/ML IJ SOLN
INTRAMUSCULAR | Status: AC
  Filled 2023-10-01: qty 2

## 2023-10-01 MED ORDER — DARBEPOETIN ALFA 40 MCG/0.4ML IJ SOSY
40.0000 ug | PREFILLED_SYRINGE | Freq: Once | INTRAMUSCULAR | Status: AC
  Administered 2023-10-01: 40 ug via SUBCUTANEOUS
  Filled 2023-10-01: qty 0.4

## 2023-10-01 MED ORDER — ALTEPLASE 2 MG IJ SOLR: 2.0000 mg | Freq: Once | INTRAMUSCULAR | Status: DC | PRN

## 2023-10-01 MED ORDER — FUROSEMIDE 10 MG/ML IJ SOLN
INTRAMUSCULAR | Status: AC
  Filled 2023-10-01: qty 4

## 2023-10-01 MED ORDER — PENTAFLUOROPROP-TETRAFLUOROETH EX AERO: 1.0000 | INHALATION_SPRAY | CUTANEOUS | Status: DC | PRN

## 2023-10-01 NOTE — Progress Notes (Signed)
Occupational Therapy Treatment Patient Details Name: Timothy Mata MRN: [id number] DOB: 1933-12-25 Today's Date: 10/01/2023   History of present illness 89 y.o. male admitted at [facility] 2023/10/19 with abdominal pain, UTI, sepsis.  9/22 cardiac arrest,  PEA 8-10 min with transfer to [facility]. Intubation 9/22-9/23. PMHx: Aflutter on Eliquis and amiodarone, BPH, COPD, HTN, gout   OT comments  Pt is making limited progress towards their acute OT goals. He remains limited by activity tolerance. Pt had just returned to the EOB from the recliner upon arrival, he declined further mobility or ADLs but agreeable to bed level exercise. BUE exercises completed with red theraband listed below. VSS on 1.5L, extended rest breaks with PLB needed between each exercise. Educated and encourage pt to get OOB for each meal with nsg staff. OT to continue to follow acutely to facilitate progress towards established goals. Pt will continue to benefit from [facility]       If plan is discharge home, recommend the following:  A little help with walking and/or transfers;A little help with bathing/dressing/bathroom;Assistance with cooking/housework;Direct supervision/assist for medications management;Direct supervision/assist for financial management   Equipment Recommendations  None recommended by OT       Precautions / Restrictions Precautions Precautions: Fall;Other (comment) Precaution Comments: watch sats, incontinent stool Restrictions Weight Bearing Restrictions: No       Mobility Bed Mobility Overal bed mobility: Needs Assistance Bed Mobility: Sit to Supine       Sit to supine: Min assist        Transfers Overall transfer level: Needs assistance                 General transfer comment: sitting EOB with NT upon arrival     Balance Overall balance assessment: Needs assistance Sitting-balance support: No upper extremity supported, Feet supported Sitting balance-Leahy Scale: Good             ADL  either performed or assessed with clinical judgement   ADL Overall ADL's : Needs assistance/impaired             General ADL Comments: pt declined ADLs this date due to fatigue and had just returned to bed    Extremity/Trunk Assessment Upper Extremity Assessment Upper Extremity Assessment: Generalized weakness   Lower Extremity Assessment Lower Extremity Assessment: Defer to PT evaluation        Vision   Vision Assessment?: No apparent visual deficits Additional Comments: has glasses   Perception Perception Perception: Within Functional Limits   Praxis Praxis Praxis: WFL    Cognition Arousal: Alert Behavior During Therapy: Flat affect Overall Cognitive Status: Within Functional Limits for tasks assessed             General Comments: continues to need encouragement, and education on activity tolerance        Exercises Exercises: General Upper Extremity General Exercises - Upper Extremity Shoulder Flexion: Strengthening, Both, 5 reps, Seated, Theraband Theraband Level (Shoulder Flexion): Level 2 (Red) Shoulder ABduction: Strengthening, Both, 10 reps, Seated, Theraband Theraband Level (Shoulder Abduction): Level 2 (Red) Elbow Flexion: Strengthening, Both, 10 reps, Seated, Theraband Elbow Extension: Strengthening, 10 reps, Both, Theraband Theraband Level (Elbow Extension): Level 2 (Red)       General Comments VSS on 1.5L    Pertinent Vitals/ Pain       Pain Assessment Pain Assessment: Faces Faces Pain Scale: Hurts a little bit Pain Location: general Pain Descriptors / Indicators: Grimacing, Guarding Pain Intervention(s): Limited activity within patient's tolerance, Monitored during session  Frequency  Min 1X/week        Progress Toward Goals  OT Goals(current goals can now be found in the care plan section)  Progress towards OT goals: Progressing toward goals  Acute Rehab OT Goals Patient Stated Goal: to rest OT Goal Formulation:  With patient Time For Goal Achievement: 10/07/23 Potential to Achieve Goals: Good ADL Goals Additional ADL Goal #1: pt will complete BADLs with mod I Additional ADL Goal #2: Pt will tolerate at least 10 minutes of OOB activity to demonstrate increased endurance for ADLs at home   AM-PAC OT "6 Clicks" Daily Activity     Outcome Measure   Help from another person eating meals?: None Help from another person taking care of personal grooming?: A Little Help from another person toileting, which includes using toliet, bedpan, or urinal?: A Little Help from another person bathing (including washing, rinsing, drying)?: A Little Help from another person to put on and taking off regular upper body clothing?: A Little Help from another person to put on and taking off regular lower body clothing?: A Little 6 Click Score: 19    End of Session Equipment Utilized During Treatment: Oxygen  OT Visit Diagnosis: Unsteadiness on feet (R26.81);Other abnormalities of gait and mobility (R26.89);Muscle weakness (generalized) (M62.81)   Activity Tolerance Patient limited by fatigue   Patient Left in bed;with call bell/phone within reach;with bed alarm set   Nurse Communication Mobility status        Time: 1120-1140 OT Time Calculation (min): 20 min  Charges: OT General Charges $OT Visit: 1 Visit OT Treatments $Therapeutic Exercise: 8-22 mins  Derenda Mis, OTR/L Acute Rehabilitation Services Office [phone] Secure Chat Communication Preferred   Donia Pounds 10/01/2023, 2:44 PM

## 2023-10-01 NOTE — Plan of Care (Signed)

## 2023-10-01 NOTE — Progress Notes (Signed)
Patient ID: Timothy Mata, male   DOB: 07-23-1933, 89 y.o.   MRN: [id number]   Subjective:  He had 1.4L uop over 10/1.  Last HD on 9/30 with 0.5 kg UF.  He has a foley and this is being irrigated frequently per nursing due to clotting.  We discussed still assessing for renal recovery.  Feels ok - tired.  Working with PT with having fatigue during sessions.    Review of systems:     Denies shortness of breath or chest pain  Denies n/v     O:BP 133/74 (BP Location: Left Arm)   Pulse 100   Temp (!) 97.5 F (36.4 C) (Oral)   Resp 18   Ht 5\' 10"  (1.778 m)   Wt 113.4 kg   SpO2 97%   BMI 35.87 kg/m   Intake/Output Summary (Last 24 hours) at 10/01/2023 0711 Last data filed at 10/01/2023 0600 Gross per 24 hour  Intake 440 ml  Output 1400 ml  Net -960 ml   Intake/Output: I/O last 3 completed shifts: In: 540 [P.O.:220; Other:320] Out: 1400 [Urine:1400]  Intake/Output this shift:  No intake/output data recorded. Weight change: -0.3 kg  General elderly male in bed in no acute distress.  I repositioned his nasal cannula  HEENT normocephalic atraumatic extraocular movements intact sclera anicteric; multiple lesions of his lips   Neck supple trachea midline Lungs clear to auscultation bilaterally normal work of breathing at rest on 2 liters oxygen Heart S1S2 no rub Abdomen soft nontender mildly distended Extremities 1+ edema lower extremities Psych normal mood and affect Neuro - awake and alert; provides basic hx and follows commands  GU - foley catheter in place     Recent Labs  Lab 09/25/23 0336 09/26/23 0418 09/27/23 0838 09/28/23 0319 09/29/23 0413 09/30/23 0216 10/01/23 0339  NA 136 135 137 137 137 135 136  K 4.4 4.3 4.1 3.8 3.8 3.9 4.0  CL 106 105 101 101 103 97* 100  CO2 16* 16* 17* 21* 19* 22 22  GLUCOSE 93 94 90 104* 103* 103* 91  BUN 82* 95* 81* 86* 97* 68* 79*  CREATININE 7.36* 8.50* 7.24* 7.81* 8.20* 6.37* 6.99*  ALBUMIN 1.8* 1.6* 1.6* 2.1* 2.0* 2.0* 2.0*   CALCIUM 7.8* 7.8* 7.8* 7.9* 8.0* 7.8* 8.0*  PHOS 7.8* 9.7* 8.4* 8.9* 9.1* 7.5* 8.9*   Liver Function Tests: Recent Labs  Lab 09/29/23 0413 09/30/23 0216 10/01/23 0339  ALBUMIN 2.0* 2.0* 2.0*    No results for input(s): "AMMONIA" in the last 168 hours. CBC: Recent Labs  Lab 09/27/23 0838 09/28/23 0319 09/29/23 0413 09/30/23 0216 10/01/23 0339  WBC 19.7* 16.7* 17.5* 20.6* 22.3*  NEUTROABS 15.7* 12.9* 13.6* 18.5* 17.8*  HGB 9.2* 8.3* 8.8* 9.0* 9.3*  HCT 28.6* 26.0* 27.8* 28.0* 28.9*  MCV 90.2 90.6 92.4 89.2 91.7  PLT 468* 488* 534* 577* 617*   Cardiac Enzymes: No results for input(s): "CKTOTAL", "CKMB", "CKMBINDEX", "TROPONINI" in the last 168 hours. CBG: Recent Labs  Lab 09/30/23 0433 09/30/23 0724 09/30/23 1140 09/30/23 1602 09/30/23 2110  GLUCAP 97 97 109* 116* 132*    Studies/Results: DG CHEST PORT 1 VIEW  Result Date: 09/30/2023 CLINICAL DATA:  Lymphocytosis, hemothorax EXAM: PORTABLE CHEST - 1 VIEW COMPARISON:  09/25/2023 FINDINGS: Right IJ central line placement to the mid right atrium. No pneumothorax. Stable right arm PICC. Increasing left pleural effusion surrounding and compressing the left lung particularly at the base. Heart size upper limits normal. Aortic Atherosclerosis (ICD10-170.0). Post fixation of right  rib fractures. Blunting of right lateral costophrenic angle as before. IMPRESSION: 1. Increasing left pleural effusion. 2. Right IJ central line to mid right atrium. No pneumothorax. Electronically Signed   By: Corlis Leak M.D.   On: 09/30/2023 15:46   US PELVIS LIMITED (TRANSABDOMINAL ONLY)  Result Date: 09/30/2023 CLINICAL DATA:  Hematuria EXAM: LIMITED ULTRASOUND OF PELVIS TECHNIQUE: Limited transabdominal ultrasound examination of the pelvis was performed. COMPARISON:  CT 09/13/2023 FINDINGS: Foley catheter in place. Bladder decompressed around the Foley catheter and difficult to visualize and evaluate. Complex hypoechoic material around the Foley  catheter balloon may reflect blood products within the bladder lumen. IMPRESSION: Foley catheter in place with bladder decompressed and difficult to evaluate. Electronically Signed   By: Charlett Nose M.D.   On: 09/30/2023 01:51    amiodarone  200 mg Oral Daily   atorvastatin  80 mg Oral q1800   Chlorhexidine Gluconate Cloth  6 each Topical Q0600   finasteride  5 mg Oral Daily   fluticasone furoate-vilanterol  1 puff Inhalation Daily   iron polysaccharides  150 mg Oral Daily   levothyroxine  75 mcg Oral Q0600   lidocaine  20 mL Infiltration Once   mouth rinse  15 mL Mouth Rinse 4 times per day   polyethylene glycol  17 g Oral Daily   sevelamer carbonate  1,600 mg Oral TID WC   tamsulosin  0.4 mg Oral QPC supper    BMET    Component Value Date/Time   NA 136 10/01/2023 0339   K 4.0 10/01/2023 0339   CL 100 10/01/2023 0339   CO2 22 10/01/2023 0339   GLUCOSE 91 10/01/2023 0339   BUN 79 (H) 10/01/2023 0339   CREATININE 6.99 (H) 10/01/2023 0339   CALCIUM 8.0 (L) 10/01/2023 0339   GFRNONAA 7 (L) 10/01/2023 0339   GFRAA 47 (L) 07/18/2015 0939   CBC    Component Value Date/Time   WBC 22.3 (H) 10/01/2023 0339   RBC 3.15 (L) 10/01/2023 0339   HGB 9.3 (L) 10/01/2023 0339   HCT 28.9 (L) 10/01/2023 0339   PLT 617 (H) 10/01/2023 0339   MCV 91.7 10/01/2023 0339   MCH 29.5 10/01/2023 0339   MCHC 32.2 10/01/2023 0339   RDW 16.0 (H) 10/01/2023 0339   LYMPHSABS 1.0 10/01/2023 0339   MONOABS 2.0 (H) 10/01/2023 0339   EOSABS 0.2 10/01/2023 0339   BASOSABS 0.1 10/01/2023 0339    Assessment/Plan:  AKI/CKD Stage IIIb, oliguric - presumably ischemic ATN in setting of PEA arrest.  Blood pressures have improved.  Persistently worsening kidney function with low urine output.  Temp cath placed by IR on 9/28 (given concern for infection). Had first HD on 9/28. Possibly had some obstructive component and mild left hydro so foley placed 9/28. Urine very dark possibly some blood/ATN.   HD again today.   Assess dialysis needs daily Plan for lasix 80 mg IV again tomorrow.  Assess diuretic needs daily Leukocytosis; IR was previously called to place tunneled dialysis catheter this week per charting - will need to check to see if on schedule.  Note that right now with continued leukocytosis Continue strict ins/outs  PEA arrest and septic shock - shock resolved. Abx per primary team.  9/22 blood cx negative  Acute respiratory failure post arrest with underlying COPD - weaned to room air.   Leukocytosis - repeat blood cultures.  Work-up per primary team. Urinary source?  Abx per primary team.  I have reached out to the primary  team.  Afib/flutter - on amiodarone per primary team  CKD stage 3b - baseline Cr 1.9 - 2.4 Normocytic anemia - likely combination of AKI/CKD and anemia of critical illness.  Transfuse for Hgb <7. Will give aranesp 40 mcg once today.  Iron deficiency.  Gave venofer x 1 dose on 10/1 and oral iron HFrEF - EF 60% and RV function low normal.  HD as above to optimize as needed Hyperphosphatemia: have increased sevelamer dose.  HD as above  Disposition - continue inpatient monitoring.  He has just been initiated on HD for AKI and we are monitoring for the length of need  Estanislado Emms, MD 7:28 AM 10/01/2023

## 2023-10-01 NOTE — Progress Notes (Signed)
PROGRESS NOTE    Timothy Mata  ZOX:[id number] DOB: October 20, 1933 DOA: 09/08/2023 PCP: Sheela Stack   Brief Narrative:  89 yo male with PMH aflutter on Eliquis, COPD, HFrEF, HTN, HLD who initially presented to [facility] on 09/10/2023 with increased urinary frequency.  Chest x-ray had shown mild pulmonary edema.  CT abdomen/pelvis showed mild left hydroureteronephrosis; small bilateral pleural effusions.  UA was suggestive of UTI.  He was started on IV fluids and antibiotics.  Due to worsening hypotension, he was transferred to ICU.  He subsequently suffered PEA arrest with ROSC after 10 minutes.  He was then transferred to [facility], ICU.  He was extubated on 09/22/2023.  He was found to have hematuria requiring Foley catheter exchange on 09/19/2023.  Nephrology was consulted for AKI.  Urology was also consulted on 09/30/2023 for hematuria.  Assessment & Plan:   Septic shock: Present on admission -Resolved.  Due to UTI on admission.  Urine culture was indeterminate.  Patient completed a 5-day course of Rocephin  Leukocytosis -Unclear cause.  WBCs has worsened to 22.3 today.  Procalcitonin 1.12 on 09/30/2023.  Had completed Rocephin on 09/26/2023.  Check blood cultures.  UA and urine culture.  Chest x-ray on 09/30/2023 showed increased  left pleural effusion.  If WBCs continues to worsen, will proceed with CT of chest abdomen and pelvis. -Might be reactive from PEA arrest -Remains afebrile  Hematuria BPH -Foley catheter exchanged on 09/26/2023 -Pelvic ultrasound on 09/19/2023 showed decompression bladder but still concern for some blood products although may be small amount after discussion with urology -Urology following.  Aspirin and Eliquis on hold. -Continue Flomax and finasteride  Acute renal failure superimposed on CKD stage IIIb -Baseline creatinine of 1.92. -Nephrology following.  Creatinine 6.99 today.  Monitor.  Follow nephrology recommendations. -RIJ placed by IR on 9/27  for HD access.  Pleural effusion on left -Moderate to large left pleural effusion on chest x-ray on 09/24/2023: Status post thoracentesis on 09/25/2023: Noted to be bloody fluid and was exudative by criteria so likely this was hemothorax -Chest x-ray on 09/30/2023 showed increasing left pleural effusion.  Monitor.  Acute respiratory failure with hypoxia -In the setting of cardiac arrest; extubated on 09/22/2023. -Currently on room air.  Anemia of chronic disease/normocytic anemia -Hemoglobin currently stable  Thrombocytosis -Possibly reactive.  Monitor intermittently.  Paroxysmal A-fib -Eliquis and aspirin on hold.  Continue amiodarone.  Rate controlled  COPD -Stable.  Chronic systolic heart failure Hypertension -Prior EF of 45% in June 2022 per notes.  Echo during this hospitalization showed EF of 60 to 65%. -Continue strict input and output.  Daily weights.  Fluid restriction.  Outpatient follow-up with cardiology.  Diuretics as per nephrology  Hyperlipidemia -Continue Lipitor  Physical deconditioning Goals of care -Overall prognosis is guarded to poor.  Consult palliative care for goals of care discussion.   DVT prophylaxis: Eliquis on hold Code Status: Full Family Communication: None at bedside Disposition Plan: Status is: Inpatient Remains inpatient appropriate because: Of severity of illness    Consultants: PCCM/nephrology/IR/urology/palliative care  Procedures: As above  Antimicrobials:  Anti-infectives (From admission, onward)    Start     Dose/Rate Route Frequency Ordered Stop   09/23/23 1300  vancomycin (VANCOREADY) IVPB 1500 mg/300 mL  Status:  Discontinued        1,500 mg 150 mL/hr over 120 Minutes Intravenous Every 48 hours 09/21/23 1339 09/22/23 0932   09/22/23 1200  cefTRIAXone (ROCEPHIN) 2 g in sodium chloride 0.9 % 100  mL IVPB        2 g 200 mL/hr over 30 Minutes Intravenous Every 24 hours 09/22/23 0934 09/26/23 1257   09/21/23 2200   piperacillin-tazobactam (ZOSYN) IVPB 3.375 g  Status:  Discontinued        3.375 g 12.5 mL/hr over 240 Minutes Intravenous Every 8 hours 09/21/23 1319 09/22/23 0932   09/21/23 1500  cefTRIAXone (ROCEPHIN) 1 g in sodium chloride 0.9 % 100 mL IVPB  Status:  Discontinued        1 g 200 mL/hr over 30 Minutes Intravenous Every 24 hours 09/18/2023 1713 09/21/23 1245   09/21/23 1500  cefTRIAXone (ROCEPHIN) 2 g in sodium chloride 0.9 % 100 mL IVPB  Status:  Discontinued        2 g 200 mL/hr over 30 Minutes Intravenous Every 24 hours 09/21/23 1245 09/21/23 1251   09/21/23 1400  vancomycin (VANCOREADY) IVPB 2000 mg/400 mL        2,000 mg 200 mL/hr over 120 Minutes Intravenous  Once 09/21/23 1252 09/21/23 1639   09/21/23 1345  piperacillin-tazobactam (ZOSYN) IVPB 3.375 g        3.375 g 100 mL/hr over 30 Minutes Intravenous  Once 09/21/23 1252 09/21/23 1354   09/17/2023 1530  cefTRIAXone (ROCEPHIN) 1 g in sodium chloride 0.9 % 100 mL IVPB        1 g 200 mL/hr over 30 Minutes Intravenous  Once 09/01/2023 1519 09/18/2023 1714        Subjective: Patient seen and examined at bedside.  Feels weak and tired.  No fever, seizures, vomiting or agitation reported.  Objective: Vitals:   09/30/23 2110 10/01/23 0500 10/01/23 0531 10/01/23 0812  BP: (!) 97/54  133/74   Pulse: 96  100   Resp: 17  18   Temp: (!) 97.4 F (36.3 C)  (!) 97.5 F (36.4 C)   TempSrc: Oral  Oral   SpO2: 97%  97% 98%  Weight:  113.4 kg    Height:        Intake/Output Summary (Last 24 hours) at 10/01/2023 1155 Last data filed at 10/01/2023 0945 Gross per 24 hour  Intake 320 ml  Output 1050 ml  Net -730 ml   Filed Weights   09/29/23 1610 09/30/23 0727 10/01/23 0500  Weight: 113.3 kg 114.1 kg 113.4 kg    Examination:  General exam: Appears calm and comfortable.  Elderly male sitting on bed.  On room air.  Looks chronically ill and deconditioned. Respiratory system: Bilateral decreased breath sounds at bases with scattered  crackles Cardiovascular system: S1 & S2 heard, Rate controlled Gastrointestinal system: Abdomen is obese, nondistended, soft and nontender. Normal bowel sounds heard. Extremities: No cyanosis, clubbing; bilateral lower extremity edema present  Central nervous system: Alert and oriented.  Slow to respond.  Poor historian.  No focal neurological deficits. Moving extremities Skin: No rashes, lesions or ulcers Psychiatry: Flat affect.  Not agitated.    Data Reviewed: I have personally reviewed following labs and imaging studies  CBC: Recent Labs  Lab 09/27/23 0838 09/28/23 0319 09/29/23 0413 09/30/23 0216 10/01/23 0339  WBC 19.7* 16.7* 17.5* 20.6* 22.3*  NEUTROABS 15.7* 12.9* 13.6* 18.5* 17.8*  HGB 9.2* 8.3* 8.8* 9.0* 9.3*  HCT 28.6* 26.0* 27.8* 28.0* 28.9*  MCV 90.2 90.6 92.4 89.2 91.7  PLT 468* 488* 534* 577* 617*   Basic Metabolic Panel: Recent Labs  Lab 09/27/23 0838 09/28/23 0319 09/29/23 0413 09/30/23 0216 10/01/23 0339  NA 137 137 137 135 136  K 4.1 3.8 3.8 3.9 4.0  CL 101 101 103 97* 100  CO2 17* 21* 19* 22 22  GLUCOSE 90 104* 103* 103* 91  BUN 81* 86* 97* 68* 79*  CREATININE 7.24* 7.81* 8.20* 6.37* 6.99*  CALCIUM 7.8* 7.9* 8.0* 7.8* 8.0*  MG 2.6* 2.7* 2.8* 2.3 2.6*  PHOS 8.4* 8.9* 9.1* 7.5* 8.9*   GFR: Estimated Creatinine Clearance: 9.4 mL/min (A) (by C-G formula based on SCr of 6.99 mg/dL (H)). Liver Function Tests: Recent Labs  Lab 09/27/23 0981 09/28/23 0319 09/29/23 0413 09/30/23 0216 10/01/23 0339  ALBUMIN 1.6* 2.1* 2.0* 2.0* 2.0*   No results for input(s): "LIPASE", "AMYLASE" in the last 168 hours. No results for input(s): "AMMONIA" in the last 168 hours. Coagulation Profile: No results for input(s): "INR", "PROTIME" in the last 168 hours. Cardiac Enzymes: No results for input(s): "CKTOTAL", "CKMB", "CKMBINDEX", "TROPONINI" in the last 168 hours. BNP (last 3 results) No results for input(s): "PROBNP" in the last 8760 hours. HbA1C: No  results for input(s): "HGBA1C" in the last 72 hours. CBG: Recent Labs  Lab 09/30/23 0433 09/30/23 0724 09/30/23 1140 09/30/23 1602 09/30/23 2110  GLUCAP 97 97 109* 116* 132*   Lipid Profile: No results for input(s): "CHOL", "HDL", "LDLCALC", "TRIG", "CHOLHDL", "LDLDIRECT" in the last 72 hours. Thyroid Function Tests: No results for input(s): "TSH", "T4TOTAL", "FREET4", "T3FREE", "THYROIDAB" in the last 72 hours. Anemia Panel: Recent Labs    09/29/23 0809  FERRITIN 387*  TIBC 206*  IRON 20*   Sepsis Labs: Recent Labs  Lab 09/30/23 0215  PROCALCITON 1.12    Recent Results (from the past 240 hour(s))  MRSA Next Gen by PCR, Nasal     Status: None   Collection Time: 09/21/23  1:30 PM   Specimen: Nasal Mucosa; Nasal Swab  Result Value Ref Range Status   MRSA by PCR Next Gen NOT DETECTED NOT DETECTED Final    Comment: (NOTE) The GeneXpert MRSA Assay (FDA approved for NASAL specimens only), is one component of a comprehensive MRSA colonization surveillance program. It is not intended to diagnose MRSA infection nor to guide or monitor treatment for MRSA infections. Test performance is not FDA approved in patients less than 51 years old. Performed at [facility] [address] [place], [place] [address]   Culture, blood (Routine X 2) w Reflex to ID Panel     Status: None   Collection Time: 09/21/23  2:28 PM   Specimen: BLOOD  Result Value Ref Range Status   Specimen Description BLOOD BLOOD LEFT HAND  Final   Special Requests   Final    BOTTLES DRAWN AEROBIC ONLY Blood Culture adequate volume   Culture   Final    NO GROWTH 5 DAYS Performed at [facility], [address], [place], [place] [address]    Report Status 09/26/2023 FINAL  Final  Culture, blood (Routine X 2) w Reflex to ID Panel     Status: None   Collection Time: 09/21/23  2:56 PM   Specimen: BLOOD  Result Value Ref Range Status   Specimen Description BLOOD BLOOD LEFT HAND  Final   Special Requests    Final    BOTTLES DRAWN AEROBIC AND ANAEROBIC Blood Culture adequate volume   Culture   Final    NO GROWTH 5 DAYS Performed at [facility], [address], [place], [place] [address]    Report Status 09/26/2023 FINAL  Final  MRSA Next Gen by PCR, Nasal     Status:  None   Collection Time: 09/21/23  6:52 PM   Specimen: Nasal Mucosa; Nasal Swab  Result Value Ref Range Status   MRSA by PCR Next Gen NOT DETECTED NOT DETECTED Final    Comment: (NOTE) The GeneXpert MRSA Assay (FDA approved for NASAL specimens only), is one component of a comprehensive MRSA colonization surveillance program. It is not intended to diagnose MRSA infection nor to guide or monitor treatment for MRSA infections. Test performance is not FDA approved in patients less than 54 years old. Performed at [facility], [address], [place], [place] [address]          Radiology Studies: DG CHEST PORT 1 VIEW  Result Date: 09/30/2023 CLINICAL DATA:  Lymphocytosis, hemothorax EXAM: PORTABLE CHEST - 1 VIEW COMPARISON:  09/25/2023 FINDINGS: Right IJ central line placement to the mid right atrium. No pneumothorax. Stable right arm PICC. Increasing left pleural effusion surrounding and compressing the left lung particularly at the base. Heart size upper limits normal. Aortic Atherosclerosis (ICD10-170.0). Post fixation of right rib fractures. Blunting of right lateral costophrenic angle as before. IMPRESSION: 1. Increasing left pleural effusion. 2. Right IJ central line to mid right atrium. No pneumothorax. Electronically Signed   By: Corlis Leak M.D.   On: 09/30/2023 15:46   US PELVIS LIMITED (TRANSABDOMINAL ONLY)  Result Date: 09/30/2023 CLINICAL DATA:  Hematuria EXAM: LIMITED ULTRASOUND OF PELVIS TECHNIQUE: Limited transabdominal ultrasound examination of the pelvis was performed. COMPARISON:  CT 09/12/2023 FINDINGS: Foley catheter in place. Bladder decompressed around the Foley catheter and difficult to visualize and  evaluate. Complex hypoechoic material around the Foley catheter balloon may reflect blood products within the bladder lumen. IMPRESSION: Foley catheter in place with bladder decompressed and difficult to evaluate. Electronically Signed   By: Charlett Nose M.D.   On: 09/30/2023 01:51        Scheduled Meds:  amiodarone  200 mg Oral Daily   atorvastatin  80 mg Oral q1800   Chlorhexidine Gluconate Cloth  6 each Topical Q0600   finasteride  5 mg Oral Daily   fluticasone furoate-vilanterol  1 puff Inhalation Daily   furosemide       furosemide       [START ON 10/02/2023] furosemide  80 mg Intravenous Once   iron polysaccharides  150 mg Oral Daily   levothyroxine  75 mcg Oral Q0600   lidocaine  20 mL Infiltration Once   mouth rinse  15 mL Mouth Rinse 4 times per day   polyethylene glycol  17 g Oral Daily   sevelamer carbonate  1,600 mg Oral TID WC   tamsulosin  0.4 mg Oral QPC supper   Continuous Infusions:  sodium chloride            Glade Lloyd, MD [facility] 10/01/2023, 11:55 AM

## 2023-10-01 NOTE — Progress Notes (Signed)
Subjective: NAEON. Pt sleeping, did not wake him on rounds. No active hematuria. Tea tinged urine with old blood sediment in tubing.    Objective: Vital signs in last 24 hours: Temp:  [97.4 F (36.3 C)-97.9 F (36.6 C)] 97.5 F (36.4 C) (10/02 0531) Pulse Rate:  [75-100] 100 (10/02 0531) Resp:  [17-19] 18 (10/02 0531) BP: (97-133)/(54-74) 133/74 (10/02 0531) SpO2:  [97 %-98 %] 98 % (10/02 0812) Weight:  [113.4 kg] 113.4 kg (10/02 0500)  Assessment/Plan:  Intake/Output from previous day: 10/01 0701 - 10/02 0700 In: 440 [P.O.:220] Out: 1400 [Urine:1400]  #Gross Hematuria After Catheter Placement / Prostatic Hypertrophy  Traumatic foley placement in the setting of BPH and blood thinners.  Continue flomax and finasteride through discharge Trend labs. Interval improvement in HgB 9.0-->9.3 OK to DC catheter when not needed for accurate UOP monitoring.    #Acute Renal Failure  Pre-renal from hypoperfusion. NO significatn hydro / stones on serial imaging to suggest significant obscturctive component.  Regarding the question asked by primary team concerning ATN vs clot obstruction of urine: This could be assessed by repeating CT A/P. Or, hematuria catheter could be placed and bladder hand irrigated. The latter would almost certainly contribute to the existing trauma to the prostate and worsen things in the short term. From what I can see, the foley is draining well and there no concern for high clot burden. UOP yesterday  Intake/Output this shift: No intake/output data recorded.  Physical Exam:  General: sleeping, appears stated age CV: No cyanosis Lungs: equal chest rise Gu: foley in place draining light tea colored urine with old blood sediment in tubing.   Lab Results: Recent Labs    09/29/23 0413 09/30/23 0216 10/01/23 0339  HGB 8.8* 9.0* 9.3*  HCT 27.8* 28.0* 28.9*   BMET Recent Labs    09/30/23 0216 10/01/23 0339  NA 135 136  K 3.9 4.0  CL 97* 100   CO2 22 22  GLUCOSE 103* 91  BUN 68* 79*  CREATININE 6.37* 6.99*  CALCIUM 7.8* 8.0*     Studies/Results: DG CHEST PORT 1 VIEW  Result Date: 09/30/2023 CLINICAL DATA:  Lymphocytosis, hemothorax EXAM: PORTABLE CHEST - 1 VIEW COMPARISON:  09/25/2023 FINDINGS: Right IJ central line placement to the mid right atrium. No pneumothorax. Stable right arm PICC. Increasing left pleural effusion surrounding and compressing the left lung particularly at the base. Heart size upper limits normal. Aortic Atherosclerosis (ICD10-170.0). Post fixation of right rib fractures. Blunting of right lateral costophrenic angle as before. IMPRESSION: 1. Increasing left pleural effusion. 2. Right IJ central line to mid right atrium. No pneumothorax. Electronically Signed   By: Corlis Leak M.D.   On: 09/30/2023 15:46   US PELVIS LIMITED (TRANSABDOMINAL ONLY)  Result Date: 09/30/2023 CLINICAL DATA:  Hematuria EXAM: LIMITED ULTRASOUND OF PELVIS TECHNIQUE: Limited transabdominal ultrasound examination of the pelvis was performed. COMPARISON:  CT 09/29/2023 FINDINGS: Foley catheter in place. Bladder decompressed around the Foley catheter and difficult to visualize and evaluate. Complex hypoechoic material around the Foley catheter balloon may reflect blood products within the bladder lumen. IMPRESSION: Foley catheter in place with bladder decompressed and difficult to evaluate. Electronically Signed   By: Charlett Nose M.D.   On: 09/30/2023 01:51      LOS: 11 days   Timothy Kirschner, NP [facility] Pager: [phone]  10/01/2023, 8:59 AM

## 2023-10-01 NOTE — Consult Note (Cosign Needed Addendum)
Consultation Note Date: 10/01/2023   Patient Name: Timothy Mata  DOB: 1933-01-09  MRN: [id number]  Age / Sex: 89 y.o., male  PCP: Royann Shivers, PA-C Referring Physician: Glade Lloyd, MD  Reason for Consultation: Establishing goals of care and Psychosocial/spiritual support  HPI/Patient Profile: 89 y.o. male   admitted on 2023-10-01  with PMH a-fib on Eliquis, COPD, HFrEF, HTN, HLD who initially presented to [facility] on 10-01-23.   Has had increased urinary frequency. CXR with mild pulm edema. CT ABD/pelvis no acute abnormality; mild left hydroureteronephrosis; small bilateral pleural effusions.   WBC elevated. UA with nitrite and large leukocytes. Cultures obtained and given IV fluids. Patient started on Rocephin.   Due to worsening hypotension, he was transferred to the ICU.  He suffered PEA arrest with ROSC after 10 minutes.  He was then transferred to [facility].  He was able to be extubated on 09/22/2023.   He developed decreased UOP and ATN after his cardiac event.  Dialysis has been initiated   Today is day 11 of this hospitalization.  Slow progress regarding physical therapy goals.  He remains weak and deconditioned, concern for long-term poor prognosis  Patient and family face treatment option decisions, advanced directive decisions and anticipatory care needs.   Clinical Assessment and Goals of Care:  This NP Lorinda Creed reviewed medical records, received report from team, assessed the patient and then meet at the patient's bedside  to discuss diagnosis, prognosis, GOC, EOL wishes disposition and options.  Later in the day I had detailed conversation by phone  with patient's wife/Bonnie Hauter and niece/Jamie Nance/main support for this couple. She is reportedly  his HPOA         They have no children  Education offered on the serious, complexity of his multiple co-morbidities  specific to his CHF, ARF, COPD and overall failure to thrive.   Concept of Palliative Care was introduced as specialized medical care for people and their families living with serious illness.  If focuses on providing relief from the symptoms and stress of a serious illness.  The goal is to improve quality of life for both the patient and the family. Values and goals of care important to patient and family were attempted to be elicited.  Created space and opportunity for patient  and family to explore thoughts and feelings regarding current medical situation.  Patient's wife is hopeful to be able to again care for her husband at home, she tells me she has always been a caregiver for many family members in the past, she prides her self in her caregiver role.  She has health  limitations,  secondary to macular degeneration.  I spoke in detail with Asher Muir regarding my concerns for continued physical and functional decline and high risk for decompensation for Mr Streets.     If patient stabilizes for discharge,   recommendation is for SNF for short-term rehab.  Asher Muir agrees and patient is open to SNF for rehab when medically stable for discharge.   I  expressed my concern for future dense likely nursing care needs in the home.     A  discussion was had today regarding advanced directives.  Concepts specific to code status, artifical feeding and hydration, continued IV antibiotics and rehospitalization was had.    The difference between a aggressive medical intervention path  and a palliative comfort care path for this patient at this time was had.    Education offered on hospice benefit; philosophy and eligibility detailed in-home services.     Questions and concerns addressed.    Patient/family   encouraged to call with questions or concerns.     PMT will continue to support holistically.  Education offered today regarding  the importance of continued conversation with family and their   medical providers regarding overall plan of care and treatment options,  ensuring decisions are within the context of the patients values and GOCs.  Family agrees to meet with PMT provider on Friday 10-03-23 at 2 pm.  Yong Channel NP will meet with family for ongoing Goals fo care conversation      No documented H POA or advance care planning documents noted.   Asher Muir reports having paperwork, will bring in for scanning.    SUMMARY OF RECOMMENDATIONS    Code Status/Advance Care Planning: Full code Educated patient/family to consider DNR/DNI status understanding evidenced based poor outcomes in similar hospitalized patient, as the cause of arrest is likely associated with advanced chronic illness rather than an easily reversible acute cardio-pulmonary event.   Palliative Prophylaxis:  Aspiration, Delirium Protocol, and Frequent Pain Assessment  Additional Recommendations (Limitations, Scope, Preferences): Full Scope Treatment  Psycho-social/Spiritual:  Desire for further Chaplaincy support:no-declined Additional Recommendations: Education on Hospice  Prognosis:  Unable to determine  Discharge Planning: To Be Determined      Primary Diagnoses: Present on Admission:  Benign prostatic hyperplasia  COPD (chronic obstructive pulmonary disease) (HCC)  Essential hypertension  HFrEF (heart failure with reduced ejection fraction) (HCC)  Acute renal failure superimposed on stage 3b chronic kidney disease (HCC)  (Resolved) Cardiac arrest (HCC)  Asymptomatic carotid artery stenosis without infarction, right  Hyperlipidemia  PAF (paroxysmal atrial fibrillation) (HCC)   I have reviewed the medical record, interviewed the patient and family, and examined the patient. The following aspects are pertinent.  Past Medical History:  Diagnosis Date   Atrial flutter (HCC)    a. s/p DCCV in 03/2022 b. repeat EKG at follow-up from DCCV showing atrial fibrillation   BPH (benign prostatic  hyperplasia)    COPD (chronic obstructive pulmonary disease) (HCC)    Dysrhythmia    Gout    "left big toe"   History of kidney stones    HOH (hard of hearing)    Hypercholesterolemia    Hypertension    Preretinal fibrosis, left eye    Spinal headache 12/30/1972   Social History   Socioeconomic History   Marital status: Married    Spouse name: Not on file   Number of children: Not on file   Years of education: Not on file   Highest education level: Not on file  Occupational History   Not on file  Tobacco Use   Smoking status: Former    Current packs/day: 1.00    Average packs/day: 1 pack/day for 32.0 years (32.0 ttl pk-yrs)    Types: Cigarettes   Smokeless tobacco: Never   Tobacco comments:    quit smoking at age 16  Vaping Use   Vaping status: Never Used  Substance and  Sexual Activity   Alcohol use: No   Drug use: No   Sexual activity: Not on file  Other Topics Concern   Not on file  Social History Narrative   Not on file   Social Determinants of Health   Financial Resource Strain: Not on file  Food Insecurity: Patient Unable To Answer (09/21/2023)   Hunger Vital Sign    Worried About Running Out of Food in the Last Year: Patient unable to answer    Ran Out of Food in the Last Year: Patient unable to answer  Transportation Needs: Patient Unable To Answer (09/21/2023)   PRAPARE - Administrator, Civil Service (Medical): Patient unable to answer    Lack of Transportation (Non-Medical): Patient unable to answer  Physical Activity: Not on file  Stress: Not on file  Social Connections: Not on file   Family History  Problem Relation Age of Onset   Cancer Father    Scheduled Meds:  amiodarone  200 mg Oral Daily   atorvastatin  80 mg Oral q1800   Chlorhexidine Gluconate Cloth  6 each Topical Q0600   finasteride  5 mg Oral Daily   fluticasone furoate-vilanterol  1 puff Inhalation Daily   furosemide       furosemide       [START ON 10/02/2023]  furosemide  80 mg Intravenous Once   iron polysaccharides  150 mg Oral Daily   levothyroxine  75 mcg Oral Q0600   lidocaine  20 mL Infiltration Once   mouth rinse  15 mL Mouth Rinse 4 times per day   polyethylene glycol  17 g Oral Daily   sevelamer carbonate  1,600 mg Oral TID WC   tamsulosin  0.4 mg Oral QPC supper   Continuous Infusions:  sodium chloride     PRN Meds:.acetaminophen, docusate sodium, furosemide, furosemide, hydrALAZINE, ipratropium-albuterol, labetalol, ondansetron **OR** ondansetron (ZOFRAN) IV, mouth rinse, sodium chloride flush Medications Prior to Admission:  Prior to Admission medications   Medication Sig Start Date End Date Taking? Authorizing Provider  apixaban (ELIQUIS) 2.5 MG TABS tablet TAKE 1 TABLET BY MOUTH TWICE A DAY 05/21/23  Yes Pricilla Riffle, MD  aspirin 81 MG chewable tablet Chew 1 tablet (81 mg total) by mouth daily. 11/26/22  Yes Rhyne, Ames Coupe, PA-C  atorvastatin (LIPITOR) 80 MG tablet Take 1 tablet (80 mg total) by mouth daily. 03/27/23 03/21/24 Yes Pricilla Riffle, MD  Fluticasone-Salmeterol (ADVAIR) 250-50 MCG/DOSE AEPB Inhale 1 puff into the lungs 2 (two) times daily.   Yes [provider]  furosemide (LASIX) 40 MG tablet Take 1 tablet (40 mg total) by mouth daily. 07/07/23 10/05/23 Yes Pricilla Riffle, MD  levothyroxine (SYNTHROID) 75 MCG tablet Take 75 mcg by mouth daily before breakfast. 08/04/23 10/26/24 Yes [provider]  metoprolol succinate (TOPROL-XL) 100 MG 24 hr tablet TAKE 1 TABLET (100 MG TOTAL) BY MOUTH IN THE MORNING. TAKE WITH OR IMMEDIATELY FOLLOWING A MEAL. 06/12/23 06/06/24 Yes Pricilla Riffle, MD  nitroGLYCERIN (NITROSTAT) 0.4 MG SL tablet Place 1 tablet (0.4 mg total) under the tongue every 5 (five) minutes x 3 doses as needed for chest pain. 03/27/22  Yes [place], Eric J, DO  amiodarone (PACERONE) 200 MG tablet TAKE 1 TABLET BY MOUTH EVERY DAY 09/25/23   Marinus Maw, MD  tamsulosin (FLOMAX) 0.4 MG CAPS capsule Take 1  capsule (0.4 mg total) by mouth daily after supper. Patient not taking: Reported on 10/02/2023 03/27/22   [place], Eric  J, DO   No Known Allergies Review of Systems  Constitutional:  Positive for appetite change.  Respiratory:  Positive for shortness of breath.   Neurological:  Positive for weakness.    Physical Exam Constitutional:      Appearance: He is obese. He is ill-appearing.     Interventions: Nasal cannula in place.  Cardiovascular:     Rate and Rhythm: Tachycardia present.  Pulmonary:     Effort: Tachypnea present.  Musculoskeletal:     Comments: Generalized weakness and muscle atrophy   Skin:    General: Skin is warm and dry.  Neurological:     Mental Status: He is alert and oriented to person, place, and time.     Vital Signs: BP 133/74 (BP Location: Left Arm)   Pulse 100   Temp (!) 97.5 F (36.4 C) (Oral)   Resp 18   Ht 5\' 10"  (1.778 m)   Wt 113.4 kg   SpO2 98%   BMI 35.87 kg/m  Pain Scale: 0-10 POSS *See Group Information*: 2-Acceptable,Slightly drowsy, easily aroused Pain Score: 0-No pain   SpO2: SpO2: 98 % O2 Device:SpO2: 98 % O2 Flow Rate: .O2 Flow Rate (L/min): 2 L/min  IO: Intake/output summary:  Intake/Output Summary (Last 24 hours) at 10/01/2023 1003 Last data filed at 10/01/2023 0600 Gross per 24 hour  Intake 280 ml  Output 1200 ml  Net -920 ml    LBM: Last BM Date : 09/29/23 Baseline Weight: Weight: 111.1 kg Most recent weight: Weight: 113.4 kg     Palliative Assessment/Data:  40 %      Time 90  minutes   Discussed with Dr Hanley Ben   Signed by: Lorinda Creed, NP   Please contact Palliative Medicine Team phone at [phone] for questions and concerns.  For individual provider: See Loretha Stapler

## 2023-10-01 NOTE — Progress Notes (Signed)
Physical Therapy Treatment Patient Details Name: Timothy Mata MRN: [id number] DOB: 1933-08-15 Today's Date: 10/01/2023   History of Present Illness 89 y.o. male admitted at [facility] 9/21 with abdominal pain, UTI, sepsis.  9/22 cardiac arrest,  PEA 8-10 min with transfer to [facility] ([facility] Intubation 9/22-9/23. PMHx: Aflutter on Eliquis and amiodarone, BPH, COPD, HTN, gout    PT Comments  Pt greeted resting in bed, agreeable to session with encouragement. Pt with continued poor progress towards acute goals due to pt stated fatigue. Pt demonstrating short gait in room with grossly CGA for safety with chair follow provided to encourage pt to increase distance and for seated rest, however pt declining exiting room to hall for increased distance and declining second bout. Pt performing seated LE therex and agreeable to time up in chair with reiteration of education on importance of continued mobility with pt verbalizing understanding. Pt continues to benefit from skilled PT services to progress toward functional mobility goals.      If plan is discharge home, recommend the following: Assistance with cooking/housework;A lot of help with bathing/dressing/bathroom;A little help with bathing/dressing/bathroom;Assist for transportation   Can travel by private vehicle        Equipment Recommendations  None recommended by PT    Recommendations for Other Services OT consult     Precautions / Restrictions Precautions Precautions: Fall;Other (comment) Precaution Comments: watch sats, incontinent stool Restrictions Weight Bearing Restrictions: No     Mobility  Bed Mobility Overal bed mobility: Needs Assistance Bed Mobility: Supine to Sit, Sit to Supine     Supine to sit: Supervision     General bed mobility comments: increased time with use of bed rail, no physical assist needed    Transfers Overall transfer level: Needs assistance Equipment used: Rolling walker (2 wheels) Transfers: Sit to/from  Stand Sit to Stand: Contact guard assist           General transfer comment: CGA to stand from EOB, cues for hand placement with pt continuing to pull up in RW    Ambulation/Gait Ambulation/Gait assistance: Contact guard assist Gait Distance (Feet): 30 Feet Assistive device: Rolling walker (2 wheels) Gait Pattern/deviations: Step-through pattern, Decreased stride length, Trunk flexed Gait velocity: decr     General Gait Details: cues for posture, proximity to RW, distance limited to pt stated tolerance despite max encouragement to continue and chair follow for seated rest if needed   Stairs             Wheelchair Mobility     Tilt Bed    Modified Rankin (Stroke Patients Only)       Balance Overall balance assessment: Needs assistance Sitting-balance support: No upper extremity supported, Feet supported Sitting balance-Leahy Scale: Good     Standing balance support: Bilateral upper extremity supported, During functional activity, Reliant on assistive device for balance Standing balance-Leahy Scale: Poor Standing balance comment: RW for balance                            Cognition Arousal: Alert Behavior During Therapy: Flat affect Overall Cognitive Status: Within Functional Limits for tasks assessed                                 General Comments: needed encouragement to participate        Exercises General Exercises - Upper Extremity Shoulder Flexion: Strengthening, Both, 5 reps, Seated, Theraband Theraband  Level (Shoulder Flexion): Level 2 (Red) Shoulder ABduction: Strengthening, Both, 10 reps, Seated, Theraband Theraband Level (Shoulder Abduction): Level 2 (Red) Elbow Flexion: Strengthening, Both, 10 reps, Seated, Theraband Theraband Level (Elbow Flexion): Level 2 (Red) General Exercises - Lower Extremity Long Arc Quad: AROM, Seated, Both, 10 reps Hip Flexion/Marching: AROM, Both, Seated, 20 reps    General Comments  General comments (skin integrity, edema, etc.): VSS on RA,      Pertinent Vitals/Pain Pain Assessment Pain Assessment: Faces Faces Pain Scale: Hurts a little bit Pain Location: general Pain Descriptors / Indicators: Grimacing, Guarding Pain Intervention(s): Monitored during session, Repositioned    Home Living                          Prior Function            PT Goals (current goals can now be found in the care plan section) Acute Rehab PT Goals Patient Stated Goal: return home PT Goal Formulation: With patient/family Time For Goal Achievement: 10/07/23 Potential to Achieve Goals: Good Progress towards PT goals: Not progressing toward goals - comment (fatigue)    Frequency    Min 1X/week      PT Plan      Co-evaluation              AM-PAC PT "6 Clicks" Mobility   Outcome Measure  Help needed turning from your back to your side while in a flat bed without using bedrails?: A Little Help needed moving from lying on your back to sitting on the side of a flat bed without using bedrails?: A Little Help needed moving to and from a bed to a chair (including a wheelchair)?: A Little Help needed standing up from a chair using your arms (e.g., wheelchair or bedside chair)?: A Little Help needed to walk in hospital room?: A Little Help needed climbing 3-5 steps with a railing? : Total 6 Click Score: 16    End of Session Equipment Utilized During Treatment: Gait belt Activity Tolerance: Patient limited by fatigue Patient left: in chair;with call bell/phone within reach Nurse Communication: Mobility status PT Visit Diagnosis: Other abnormalities of gait and mobility (R26.89);History of falling (Z91.81)     Time: 9604-5409 PT Time Calculation (min) (ACUTE ONLY): 19 min  Charges:    $Therapeutic Exercise: 8-22 mins PT General Charges $$ ACUTE PT VISIT: 1 Visit                     Mathew Storck R. PTA Acute Rehabilitation Services Office:  [phone]   Catalina Antigua 10/01/2023, 11:48 AM

## 2023-10-02 ENCOUNTER — Inpatient Hospital Stay (HOSPITAL_COMMUNITY): Payer: Medicare HMO

## 2023-10-02 ENCOUNTER — Encounter (HOSPITAL_COMMUNITY): Payer: Self-pay | Admitting: Emergency Medicine

## 2023-10-02 DIAGNOSIS — Z515 Encounter for palliative care: Secondary | ICD-10-CM | POA: Diagnosis not present

## 2023-10-02 DIAGNOSIS — Z7189 Other specified counseling: Secondary | ICD-10-CM | POA: Diagnosis not present

## 2023-10-02 DIAGNOSIS — J449 Chronic obstructive pulmonary disease, unspecified: Secondary | ICD-10-CM

## 2023-10-02 DIAGNOSIS — R6521 Severe sepsis with septic shock: Secondary | ICD-10-CM | POA: Diagnosis not present

## 2023-10-02 DIAGNOSIS — N170 Acute kidney failure with tubular necrosis: Secondary | ICD-10-CM | POA: Diagnosis not present

## 2023-10-02 DIAGNOSIS — I4892 Unspecified atrial flutter: Secondary | ICD-10-CM

## 2023-10-02 DIAGNOSIS — I7102 Dissection of abdominal aorta: Secondary | ICD-10-CM | POA: Diagnosis not present

## 2023-10-02 DIAGNOSIS — N1832 Chronic kidney disease, stage 3b: Secondary | ICD-10-CM | POA: Diagnosis not present

## 2023-10-02 DIAGNOSIS — A419 Sepsis, unspecified organism: Secondary | ICD-10-CM | POA: Diagnosis not present

## 2023-10-02 LAB — RENAL FUNCTION PANEL
Albumin: 2 g/dL — ABNORMAL LOW (ref 3.5–5.0)
Anion gap: 12 (ref 5–15)
BUN: 48 mg/dL — ABNORMAL HIGH (ref 8–23)
CO2: 26 mmol/L (ref 22–32)
Calcium: 7.8 mg/dL — ABNORMAL LOW (ref 8.9–10.3)
Chloride: 97 mmol/L — ABNORMAL LOW (ref 98–111)
Creatinine, Ser: 4.69 mg/dL — ABNORMAL HIGH (ref 0.61–1.24)
GFR, Estimated: 11 mL/min — ABNORMAL LOW (ref >60)
Glucose, Bld: 95 mg/dL (ref 70–99)
Phosphorus: 5.7 mg/dL — ABNORMAL HIGH (ref 2.5–4.6)
Potassium: 3.8 mmol/L (ref 3.5–5.1)
Sodium: 135 mmol/L (ref 135–145)

## 2023-10-02 LAB — CBC WITH DIFFERENTIAL/PLATELET
Abs Immature Granulocytes: 1.22 10*3/uL — ABNORMAL HIGH (ref 0.00–0.07)
Abs Immature Granulocytes: 1.57 10*3/uL — ABNORMAL HIGH (ref 0.00–0.07)
Basophils Absolute: 0.1 10*3/uL (ref 0.0–0.1)
Basophils Absolute: 0.1 10*3/uL (ref 0.0–0.1)
Basophils Relative: 0 %
Basophils Relative: 0 %
Eosinophils Absolute: 0.3 10*3/uL (ref 0.0–0.5)
Eosinophils Absolute: 0.1 10*3/uL (ref 0.0–0.5)
Eosinophils Relative: 1 %
Eosinophils Relative: 0 %
HCT: 29.1 % — ABNORMAL LOW (ref 39.0–52.0)
HCT: 28.2 % — ABNORMAL LOW (ref 39.0–52.0)
Hemoglobin: 9.4 g/dL — ABNORMAL LOW (ref 13.0–17.0)
Hemoglobin: 9 g/dL — ABNORMAL LOW (ref 13.0–17.0)
Immature Granulocytes: 5 %
Immature Granulocytes: 6 %
Lymphocytes Relative: 4 %
Lymphocytes Relative: 3 %
Lymphs Abs: 1 10*3/uL (ref 0.7–4.0)
Lymphs Abs: 0.8 10*3/uL (ref 0.7–4.0)
MCH: 29.6 pg (ref 26.0–34.0)
MCH: 29 pg (ref 26.0–34.0)
MCHC: 32.3 g/dL (ref 30.0–36.0)
MCHC: 31.9 g/dL (ref 30.0–36.0)
MCV: 91.5 fL (ref 80.0–100.0)
MCV: 91 fL (ref 80.0–100.0)
Monocytes Absolute: 1.7 10*3/uL — ABNORMAL HIGH (ref 0.1–1.0)
Monocytes Absolute: 1.8 10*3/uL — ABNORMAL HIGH (ref 0.1–1.0)
Monocytes Relative: 7 %
Monocytes Relative: 7 %
Neutro Abs: 18.2 10*3/uL — ABNORMAL HIGH (ref 1.7–7.7)
Neutro Abs: 22.4 10*3/uL — ABNORMAL HIGH (ref 1.7–7.7)
Neutrophils Relative %: 83 %
Neutrophils Relative %: 84 %
Platelets: 572 10*3/uL — ABNORMAL HIGH (ref 150–400)
Platelets: 603 10*3/uL — ABNORMAL HIGH (ref 150–400)
RBC: 3.18 MIL/uL — ABNORMAL LOW (ref 4.22–5.81)
RBC: 3.1 MIL/uL — ABNORMAL LOW (ref 4.22–5.81)
RDW: 16 % — ABNORMAL HIGH (ref 11.5–15.5)
RDW: 16.2 % — ABNORMAL HIGH (ref 11.5–15.5)
WBC: 22.4 10*3/uL — ABNORMAL HIGH (ref 4.0–10.5)
WBC: 26.7 10*3/uL — ABNORMAL HIGH (ref 4.0–10.5)
nRBC: 0.1 % (ref 0.0–0.2)
nRBC: 0.1 % (ref 0.0–0.2)

## 2023-10-02 LAB — COMPREHENSIVE METABOLIC PANEL
ALT: 23 U/L (ref 0–44)
AST: 34 U/L (ref 15–41)
Albumin: 1.9 g/dL — ABNORMAL LOW (ref 3.5–5.0)
Alkaline Phosphatase: 86 U/L (ref 38–126)
Anion gap: 16 — ABNORMAL HIGH (ref 5–15)
BUN: 53 mg/dL — ABNORMAL HIGH (ref 8–23)
CO2: 22 mmol/L (ref 22–32)
Calcium: 7.7 mg/dL — ABNORMAL LOW (ref 8.9–10.3)
Chloride: 98 mmol/L (ref 98–111)
Creatinine, Ser: 5.35 mg/dL — ABNORMAL HIGH (ref 0.61–1.24)
GFR, Estimated: 10 mL/min — ABNORMAL LOW (ref >60)
Glucose, Bld: 135 mg/dL — ABNORMAL HIGH (ref 70–99)
Potassium: 4.1 mmol/L (ref 3.5–5.1)
Sodium: 136 mmol/L (ref 135–145)
Total Bilirubin: 1.3 mg/dL — ABNORMAL HIGH (ref 0.3–1.2)
Total Protein: 6.1 g/dL — ABNORMAL LOW (ref 6.5–8.1)

## 2023-10-02 LAB — URINALYSIS, MICROSCOPIC (REFLEX): RBC / HPF: >50 RBC/hpf (ref 0–5)

## 2023-10-02 LAB — PROCALCITONIN: Procalcitonin: 0.64 ng/mL

## 2023-10-02 LAB — URINALYSIS, ROUTINE W REFLEX MICROSCOPIC

## 2023-10-02 LAB — GLUCOSE, CAPILLARY
Glucose-Capillary: 129 mg/dL — ABNORMAL HIGH (ref 70–99)
Glucose-Capillary: 118 mg/dL — ABNORMAL HIGH (ref 70–99)
Glucose-Capillary: 126 mg/dL — ABNORMAL HIGH (ref 70–99)

## 2023-10-02 LAB — LACTIC ACID, PLASMA
Lactic Acid, Venous: 3 mmol/L (ref 0.5–1.9)
Lactic Acid, Venous: 1.8 mmol/L (ref 0.5–1.9)

## 2023-10-02 LAB — MAGNESIUM: Magnesium: 2.1 mg/dL (ref 1.7–2.4)

## 2023-10-02 LAB — PROTIME-INR
INR: 1.9 — ABNORMAL HIGH (ref 0.8–1.2)
Prothrombin Time: 21.7 s — ABNORMAL HIGH (ref 11.4–15.2)

## 2023-10-02 LAB — APTT: aPTT: 34 s (ref 24–36)

## 2023-10-02 MED ORDER — PIPERACILLIN-TAZOBACTAM 3.375 G IVPB 30 MIN
3.3750 g | Freq: Once | INTRAVENOUS | Status: AC
  Administered 2023-10-02: 3.375 g via INTRAVENOUS
  Filled 2023-10-02 (×2): qty 50

## 2023-10-02 MED ORDER — SODIUM CHLORIDE 0.9 % IV BOLUS
250.0000 mL | Freq: Once | INTRAVENOUS | Status: AC
  Administered 2023-10-02: 250 mL via INTRAVENOUS

## 2023-10-02 MED ORDER — FUROSEMIDE 10 MG/ML IJ SOLN
INTRAMUSCULAR | Status: AC
  Filled 2023-10-02: qty 8

## 2023-10-02 MED ORDER — CHLORHEXIDINE GLUCONATE CLOTH 2 % EX PADS: 6.0000 | MEDICATED_PAD | Freq: Every day | CUTANEOUS | Status: DC

## 2023-10-02 MED ORDER — AMIODARONE LOAD VIA INFUSION
150.0000 mg | Freq: Once | INTRAVENOUS | Status: AC
  Administered 2023-10-02: 150 mg via INTRAVENOUS
  Filled 2023-10-02: qty 83.34

## 2023-10-02 MED ORDER — NOREPINEPHRINE 4 MG/250ML-% IV SOLN
0.0000 ug/min | INTRAVENOUS | Status: DC
  Administered 2023-10-02: 8 ug/min via INTRAVENOUS
  Filled 2023-10-02 (×2): qty 250

## 2023-10-02 MED ORDER — NOREPINEPHRINE 4 MG/250ML-% IV SOLN
INTRAVENOUS | Status: AC
  Administered 2023-10-02: 4 mg via INTRAVENOUS
  Filled 2023-10-02: qty 250

## 2023-10-02 MED ORDER — ACETAMINOPHEN 325 MG PO TABS: 650.0000 mg | ORAL_TABLET | Freq: Four times a day (QID) | ORAL | Status: DC | PRN

## 2023-10-02 MED ORDER — DARBEPOETIN ALFA 40 MCG/0.4ML IJ SOSY: 40.0000 ug | PREFILLED_SYRINGE | INTRAMUSCULAR | Status: DC

## 2023-10-02 MED ORDER — VANCOMYCIN HCL 2000 MG/400ML IV SOLN
2000.0000 mg | Freq: Once | INTRAVENOUS | Status: AC
  Administered 2023-10-02: 2000 mg via INTRAVENOUS
  Filled 2023-10-02: qty 400

## 2023-10-02 MED ORDER — SODIUM CHLORIDE 0.9 % IV BOLUS
500.0000 mL | Freq: Once | INTRAVENOUS | Status: AC
  Administered 2023-10-02: 500 mL via INTRAVENOUS

## 2023-10-02 MED ORDER — AMIODARONE HCL IN DEXTROSE 360-4.14 MG/200ML-% IV SOLN
30.0000 mg/h | INTRAVENOUS | Status: DC
  Administered 2023-10-02 – 2023-10-04 (×4): 30 mg/h via INTRAVENOUS
  Filled 2023-10-02 (×3): qty 200

## 2023-10-02 MED ORDER — VANCOMYCIN HCL 2000 MG/400ML IV SOLN
2000.0000 mg | Freq: Once | INTRAVENOUS | Status: DC
  Filled 2023-10-02: qty 400

## 2023-10-02 MED ORDER — AMIODARONE HCL IN DEXTROSE 360-4.14 MG/200ML-% IV SOLN
INTRAVENOUS | Status: AC
  Administered 2023-10-02: 60 mg/h via INTRAVENOUS
  Filled 2023-10-02: qty 200

## 2023-10-02 MED ORDER — MIDODRINE HCL 5 MG PO TABS
10.0000 mg | ORAL_TABLET | Freq: Three times a day (TID) | ORAL | Status: DC
  Administered 2023-10-02 – 2023-10-04 (×3): 10 mg via ORAL
  Filled 2023-10-02 (×4): qty 2

## 2023-10-02 MED ORDER — SODIUM CHLORIDE 0.9 % IV SOLN
1.0000 g | Freq: Once | INTRAVENOUS | Status: DC
  Filled 2023-10-02: qty 10

## 2023-10-02 MED ORDER — SODIUM CHLORIDE 0.9 % IV SOLN: 1.0000 g | INTRAVENOUS | Status: DC

## 2023-10-02 MED ORDER — PIPERACILLIN-TAZOBACTAM IN DEX 2-0.25 GM/50ML IV SOLN
2.2500 g | Freq: Three times a day (TID) | INTRAVENOUS | Status: DC
  Administered 2023-10-02 – 2023-10-04 (×5): 2.25 g via INTRAVENOUS
  Filled 2023-10-02 (×9): qty 50

## 2023-10-02 MED ORDER — VANCOMYCIN HCL IN DEXTROSE 1-5 GM/200ML-% IV SOLN: 1000.0000 mg | INTRAVENOUS | Status: DC

## 2023-10-02 MED ORDER — IOHEXOL 350 MG/ML SOLN
80.0000 mL | Freq: Once | INTRAVENOUS | Status: AC | PRN
  Administered 2023-10-02: 80 mL via INTRAVENOUS

## 2023-10-02 MED ORDER — AMIODARONE HCL IN DEXTROSE 360-4.14 MG/200ML-% IV SOLN
60.0000 mg/h | INTRAVENOUS | Status: AC
  Administered 2023-10-02: 60 mg/h via INTRAVENOUS
  Filled 2023-10-02: qty 200

## 2023-10-02 MED ORDER — LACTATED RINGERS IV BOLUS: 500.0000 mL | Freq: Once | INTRAVENOUS | Status: AC | PRN

## 2023-10-02 NOTE — Progress Notes (Signed)
Critical care asked for STAT ECHO.  I did bedside ECHO with portable US Difficult images given body habitus  Subcostal images show- probably moderate effusion with echodense in pericardial space- likely coagulum. Consistent with hemorrhagic pericardial effusion (most likely). There is NO chamber collapse. IVC appears dilated. Did not see any atrial inversion or diastolic chamber collapse. Pericardial effusion is circumferential LVEF visually is ~45%. However, pt In Afib RVR.  Pt on low dose levophed, hemodynamics improved infact and not worsened. CTSx Doc at bedside Reviewed images together. Critical care team notified  Recommend repeat full ECHO in am

## 2023-10-02 NOTE — Progress Notes (Addendum)
eLink Physician-Brief Progress Note Patient Name: Timothy Mata DOB: 1933-03-27 MRN: [id number]   Date of Service  10/02/2023  HPI/Events of Note  89/M who had presented on 09/06/2023 with shock complicated by cardiac arrest. He had been slowly recovering; post-arrest course had been further complicated by AKI requiring intermittent HD.  Last night, pt had become hypotensive and was transferred back to the ICU for initiation of a levophed drip.  CT chest was done, which ws suspicious for thrombus/possible dissection. Subsequent CT contrast showed penetrating ulcer in the ascending aorta anteriorly with possible prior pericardial hemorrhage.   Clinically, pt lying in bed, without complaints. Denies chest pain or shortness of breath.  On levophed at 51mcg/min - stable dose.  Not on anticoagulation at this time.    eICU Interventions  - Consulted cardiac surgery. Pt unlikely to be candidate for intervention. Recommended echocardiogram.  - STAT echo ordered.  - Ground team has contacted cardiology fellow who will perform POCUS        Timothy Mata 10/02/2023, 9:28 PM

## 2023-10-02 NOTE — Progress Notes (Signed)
Received patient in bed to unit.  Alert and oriented.  Informed consent signed and in chart.   TX duration: 3:00  Patient tolerated well.  Transported back to the room  Alert, without acute distress.  Hand-off given to patient's nurse.   Access used: RIJ TDC Access issues: None  Total UF removed: 1500 mL Medication(s) given: None Post HD VS: please see data insert    10/01/23 2353  Vitals  Temp 97.7 F (36.5 C)  Temp Source Axillary  BP 116/75  MAP (mmHg) 89  BP Location Left Arm  BP Method Automatic  Patient Position (if appropriate) Lying  Pulse Rate (!) 124  Pulse Rate Source Monitor  ECG Heart Rate (!) 131  Resp 20  Oxygen Therapy  SpO2 100 %  O2 Device Nasal Cannula  O2 Flow Rate (L/min) 2 L/min  Patient Activity (if Appropriate) In bed  Pulse Oximetry Type Continuous  During Treatment Monitoring  Blood Flow Rate (mL/min) 0 mL/min  Arterial Pressure (mmHg) -0.61 mmHg  Venous Pressure (mmHg) -1.01 mmHg  TMP (mmHg) -49.29 mmHg  Ultrafiltration Rate (mL/min) 667 mL/min  Dialysate Flow Rate (mL/min) 300 ml/min  Duration of HD Treatment -hour(s) 3 hour(s)  Cumulative Fluid Removed (mL) per Treatment  1500.13  Post Treatment  Dialyzer Clearance Lightly streaked  Hemodialysis Intake (mL) 0 mL  Liters Processed 54  Fluid Removed (mL) 1500 mL  Tolerated HD Treatment Yes  Post-Hemodialysis Comments Treatment completed and blood returned without issue.  Note  Patient Observations Patient awakened from sleep, alert, no c/o voiced, no acute distress noted; patient condition stable for return transport.  Hemodialysis Catheter Right Internal jugular Triple lumen Temporary (Non-Tunneled)  Placement Date/Time: 09/26/23 1726   Serial / Lot #: [id number]  Expiration Date: 02/21/28  Time Out: Correct patient;Correct site;Correct procedure  Maximum sterile barrier precautions: Hand hygiene;Large sterile sheet;Cap;Sterile probe cover;Mask;St...  Site Condition No  complications  Blue Lumen Status Flushed;Heparin locked;Dead end cap in place  Red Lumen Status Flushed;Heparin locked;Dead end cap in place  Purple Lumen Status Other (Comment) (Not in use)  Catheter fill solution Heparin 1000 units/ml  Catheter fill volume (Arterial) 1.4 cc  Catheter fill volume (Venous) 1.4  Dressing Type Transparent  Dressing Status Antimicrobial disc in place;Clean, Dry, Intact  Drainage Description None  Post treatment catheter status Capped and Clamped      [facility]

## 2023-10-02 NOTE — Progress Notes (Signed)
NAME:  Timothy Mata, MRN:  [id number], DOB:  1933-11-30, LOS: 12 ADMISSION DATE:  09/13/2023, CONSULTATION DATE:  9/22 REFERRING MD:  Dr. Sherryll Burger, CHIEF COMPLAINT:  cardiac arrest   History of Present Illness:  Patient is a 89 yo M w/ pertinent PMH a flutter on Eliquis, COPD, HFrEF, HTN, HLD presents to [facility] ED on 9/21 with abdominal pain.  Patient having 1 week of abdominal pain that progressively worsened.  Came to [facility] ED on 9/21.  Denies any fevers, N/V/D.  Has had increased urinary frequency.  CXR with mild pulm edema.  CT ABD/pelvis no acute abnormality; mild left hydroureteronephrosis; small bilateral pleural effusions.  WBC/LA elevated. UA with nitrite and large leukocytes. Cultures obtained and given IV fluids.  Patient started on Rocephin.    On 9/22 patient with increased hypotension.  Patient given IV fluids and transferred to ICU.  Patient had PEA arrest with ROSC in 10 minutes.  Patient intubated post CPR.  CT head with no acute abnormality.  CXR with pulmonary edema.  Transferring to [facility] ED.  PCCM consulted.  10/02/2023 pulmonary critical care called back to bedside for hypotension in the setting of history of out-of-hospital arrest now proven refractory to current treatments needs transfer to intensive care unit vasopressor support and resuming antimicrobial therapy.  Pertinent  Medical History   Past Medical History:  Diagnosis Date   Atrial flutter (HCC)    a. s/p DCCV in 03/2022 b. repeat EKG at follow-up from DCCV showing atrial fibrillation   BPH (benign prostatic hyperplasia)    COPD (chronic obstructive pulmonary disease) (HCC)    Dysrhythmia    Gout    "left big toe"   History of kidney stones    HOH (hard of hearing)    Hypercholesterolemia    Hypertension    Preretinal fibrosis, left eye    Spinal headache 12/30/1972     Significant Hospital Events: Including procedures, antibiotic start and stop dates in addition to other pertinent events   9/21 admitted to  [facility] w/ sepsis and uti 9/22 pea arrest; transfer to [facility]  Interim History / Subjective:  Called back to bedside for hypotension 10/02/2023 Objective   Blood pressure (!) 58/40, pulse (!) 26, temperature 97.7 F (36.5 C), temperature source Axillary, resp. rate (!) 24, height 5\' 10"  (1.778 m), weight 111.7 kg, SpO2 100%.        Intake/Output Summary (Last 24 hours) at 10/02/2023 1139 Last data filed at 10/01/2023 2353 Gross per 24 hour  Intake --  Output 1800 ml  Net -1800 ml   Filed Weights   09/30/23 0727 10/01/23 0500 10/02/23 0500  Weight: 114.1 kg 113.4 kg 111.7 kg    Examination: Ill-appearing adult Right IJ hemodialysis catheter in place Coarse rhonchi bilaterally with expiratory wheezing Sounds are irregular Foley catheter with dirty urine Abdomen obese soft nontender Lower extremities with 3-4+ pitting edema  Resolved Hospital Problem list     Assessment & Plan:   PEA arrest from outside hospital Septic shock recurrent UTI suspected recurrent on 10/02/2023 Called back to the bedside for hypotension Plan: Transfer back to intensive care unit Empirical antimicrobial therapy Panculture No further Lasix Currently he is on hemodialysis as needed Confirmed he is a full code May need vasopressor support  Acute respiratory failure post arrest Pulmonary edema and small b/l pleural effusion Hx COPD Plan: He has been liberated from mechanical ventilatory support  Afib/flutter on eliquis and amio Plan: Rate control Not currently on anticoagulation  Hypokalemia Elevated  creat on ckd 3a Plan: Currently receiving hemodialysis  Anemia Recent Labs    10/01/23 0339 10/02/23 0243  HGB 9.3* 9.4*   Transfuse per protocol   Mild Hyperglycemia CBG (last 3)  Recent Labs    09/30/23 1602 09/30/23 2110 10/02/23 1105  GLUCAP 116* 132* 129*    Plan: Sliding scale insulin protocol      Best Practice (right click and "Reselect all SmartList Selections"  daily)   Diet/type: NPO DVT prophylaxis: systemic heparin GI prophylaxis: H2B Lines: N/A; PICC line, right HD catheter subclavian Foley:  Yes, and it is still needed Code Status:  full code Last date of multidisciplinary goals of care discussion [9/22 updated wife over phone]     LABS    PULMONARY No results for input(s): "PHART", "PCO2ART", "PO2ART", "HCO3", "TCO2", "O2SAT" in the last 168 hours.  Invalid input(s): "PCO2", "PO2"   CBC Recent Labs  Lab 09/30/23 0216 10/01/23 0339 10/02/23 0243  HGB 9.0* 9.3* 9.4*  HCT 28.0* 28.9* 29.1*  WBC 20.6* 22.3* 22.4*  PLT 577* 617* 572*    COAGULATION No results for input(s): "INR" in the last 168 hours.   CARDIAC  No results for input(s): "TROPONINI" in the last 168 hours. No results for input(s): "PROBNP" in the last 168 hours.   CHEMISTRY Recent Labs  Lab 09/28/23 0319 09/29/23 0413 09/30/23 0216 10/01/23 0339 10/02/23 0243  NA 137 137 135 136 135  K 3.8 3.8 3.9 4.0 3.8  CL 101 103 97* 100 97*  CO2 21* 19* 22 22 26   GLUCOSE 104* 103* 103* 91 95  BUN 86* 97* 68* 79* 48*  CREATININE 7.81* 8.20* 6.37* 6.99* 4.69*  CALCIUM 7.9* 8.0* 7.8* 8.0* 7.8*  MG 2.7* 2.8* 2.3 2.6* 2.1  PHOS 8.9* 9.1* 7.5* 8.9* 5.7*   Estimated Creatinine Clearance: 13.9 mL/min (A) (by C-G formula based on SCr of 4.69 mg/dL (H)).   LIVER Recent Labs  Lab 09/28/23 0319 09/29/23 0413 09/30/23 0216 10/01/23 0339 10/02/23 0243  ALBUMIN 2.1* 2.0* 2.0* 2.0* 2.0*     INFECTIOUS Recent Labs  Lab 09/30/23 0215  PROCALCITON 1.12     ENDOCRINE CBG (last 3)  Recent Labs    09/30/23 1602 09/30/23 2110 10/02/23 1105  GLUCAP 116* 132* 129*         IMAGING x48h  - image(s) personally visualized  -   highlighted in bold DG CHEST PORT 1 VIEW  Result Date: 09/30/2023 CLINICAL DATA:  Lymphocytosis, hemothorax EXAM: PORTABLE CHEST - 1 VIEW COMPARISON:  09/25/2023 FINDINGS: Right IJ central line placement to the mid right  atrium. No pneumothorax. Stable right arm PICC. Increasing left pleural effusion surrounding and compressing the left lung particularly at the base. Heart size upper limits normal. Aortic Atherosclerosis (ICD10-170.0). Post fixation of right rib fractures. Blunting of right lateral costophrenic angle as before. IMPRESSION: 1. Increasing left pleural effusion. 2. Right IJ central line to mid right atrium. No pneumothorax. Electronically Signed   By: Corlis Leak M.D.   On: 09/30/2023 15:46      Critical care time: 45 minutes           \Steve Decklin Weddington ACNP Acute Care Nurse Practitioner [facility] Pulmonary/Critical Care Please consult Amion 10/02/2023, 11:39 AM

## 2023-10-02 NOTE — Plan of Care (Signed)

## 2023-10-02 NOTE — Consult Note (Signed)
Reason for Consult:intramural hematoma ascending aorta Referring Physician: Dr. Vladimir Faster  Timothy Mata is an 89 y.o. male.  HPI: Timothy Mata is an 89 yo man with a history of atrial flutter, MI, hypertension, hyperlipidemia, stage IIIB CKD, COPD, gout who was admitted on 9/21 with abdominal pain and urinary frequency.  + UA, treated for urosepsis.Became hypotensive and had a PEA arrest.  ROSC after 10 minutes of CPR.  Transferred to [facility]. Went into renal failure and started on dialysis.  Treated with amiodarone and Eliquis for A fib/ flutter.    Today developed tachycardia and hypotension while getting a CT of the chest. Started on Levophed.  There was a possible intramural hematoma of the ascending and he went back this evening for a CT with contrast.  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  Echo just done at bedside.  Reviewed images with sonographer and there is clot in pericardium but no tamponade. CT also showed a large loculated left pleural effusion.  He is awake and alert and denies chest pain.   Past Medical History:  Diagnosis Date   Atrial flutter (HCC)    a. s/p DCCV in 03/2022 b. repeat EKG at follow-up from DCCV showing atrial fibrillation   BPH (benign prostatic hyperplasia)    COPD (chronic obstructive pulmonary disease) (HCC)    Dysrhythmia    Gout    "left big toe"   History of kidney stones    HOH (hard of hearing)    Hypercholesterolemia    Hypertension    Preretinal fibrosis, left eye    Spinal headache 12/30/1972    Past Surgical History:  Procedure Laterality Date   25 GAUGE PARS PLANA VITRECTOMY WITH 20 GAUGE MVR PORT Left 07/18/2015   Procedure: 25 GAUGE PARS PLANA VITRECTOMY WITH 20 GAUGE MVR PORT;  Surgeon: Sherrie George, MD;  Location: [facility] OR;  Service: Ophthalmology;  Laterality: Left;   AIR/FLUID EXCHANGE Left 07/18/2015   Procedure: AIR/FLUID EXCHANGE;  Surgeon: Sherrie George, MD;  Location:  [facility] OR;  Service: Ophthalmology;  Laterality: Left;   BACK SURGERY     CARDIOVERSION N/A 04/26/2022   Procedure: CARDIOVERSION;  Surgeon: Jonelle Sidle, MD;  Location: [facility];  Service: Cardiovascular;  Laterality: N/A;   ENDARTERECTOMY Right 11/25/2022   Procedure: ENDARTERECTOMY CAROTID WITH PATCH ANGIOPLASTY;  Surgeon: Cephus Shelling, MD;  Location: [facility] OR;  Service: Vascular;  Laterality: Right;   EYE SURGERY     FOOT FRACTURE SURGERY Right 1981   IR FLUORO GUIDE CV LINE RIGHT  09/26/2023   IR THORACENTESIS ASP PLEURAL SPACE W/IMG GUIDE  09/25/2023   IR US GUIDE VASC ACCESS RIGHT  09/26/2023   LASER PHOTO ABLATION Left 07/18/2015   Procedure: LASER PHOTO ABLATION;  Surgeon: Sherrie George, MD;  Location: [facility] OR;  Service: Ophthalmology;  Laterality: Left;  head scope    LUMBAR DISC SURGERY  1974   MEMBRANE PEEL Left 07/18/2015   Procedure: MEMBRANE PEEL;  Surgeon: Sherrie George, MD;  Location: [facility] OR;  Service: Ophthalmology;  Laterality: Left;   MULTIPLE TOOTH EXTRACTIONS  1990's   PARS PLANA VITRECTOMY Left 07/18/2015   PATCH ANGIOPLASTY Right 11/25/2022   Procedure: PATCH ANGIOPLASTY WITH 1 cm X 6 cm XENOSURE BIOLOGIC PATCH;  Surgeon: Cephus Shelling, MD;  Location: [facility] OR;  Service: Vascular;  Laterality: Right;    Family History  Problem Relation Age of Onset   Cancer  Father     Social History:  reports that he has quit smoking. His smoking use included cigarettes. He has a 32 pack-year smoking history. He has never used smokeless tobacco. He reports that he does not drink alcohol and does not use drugs.  Allergies: No Known Allergies  Medications: Scheduled:  atorvastatin  80 mg Oral q1800   Chlorhexidine Gluconate Cloth  6 each Topical Q0600   [START ON 10/08/2023] darbepoetin (ARANESP) injection - NON-DIALYSIS  40 mcg Subcutaneous Q Wed-1800   finasteride  5 mg Oral Daily   fluticasone furoate-vilanterol  1 puff Inhalation Daily   iron polysaccharides  150 mg  Oral Daily   levothyroxine  75 mcg Oral Q0600   lidocaine  20 mL Infiltration Once   midodrine  10 mg Oral TID WC   mouth rinse  15 mL Mouth Rinse 4 times per day   polyethylene glycol  17 g Oral Daily   sevelamer carbonate  1,600 mg Oral TID WC   tamsulosin  0.4 mg Oral QPC supper    Results for orders placed or performed during the hospital encounter of 09/22/2023 (from the past 48 hour(s))  CBC with Differential/Platelet     Status: Abnormal   Collection Time: 10/01/23  3:39 AM  Result Value Ref Range   WBC 22.3 (H) 4.0 - 10.5 K/uL   RBC 3.15 (L) 4.22 - 5.81 MIL/uL   Hemoglobin 9.3 (L) 13.0 - 17.0 g/dL   HCT 96.2 (L) 95.2 - 84.1 %   MCV 91.7 80.0 - 100.0 fL   MCH 29.5 26.0 - 34.0 pg   MCHC 32.2 30.0 - 36.0 g/dL   RDW 32.4 (H) 40.1 - 02.7 %   Platelets 617 (H) 150 - 400 K/uL   nRBC 0.1 0.0 - 0.2 %   Neutrophils Relative % 80 %   Neutro Abs 17.8 (H) 1.7 - 7.7 K/uL   Lymphocytes Relative 4 %   Lymphs Abs 1.0 0.7 - 4.0 K/uL   Monocytes Relative 9 %   Monocytes Absolute 2.0 (H) 0.1 - 1.0 K/uL   Eosinophils Relative 1 %   Eosinophils Absolute 0.2 0.0 - 0.5 K/uL   Basophils Relative 0 %   Basophils Absolute 0.1 0.0 - 0.1 K/uL   Immature Granulocytes 6 %   Abs Immature Granulocytes 1.30 (H) 0.00 - 0.07 K/uL    Comment: Performed at [facility], [address], [place], [place] [address]  Renal function panel     Status: Abnormal   Collection Time: 10/01/23  3:39 AM  Result Value Ref Range   Sodium 136 135 - 145 mmol/L   Potassium 4.0 3.5 - 5.1 mmol/L   Chloride 100 98 - 111 mmol/L   CO2 22 22 - 32 mmol/L   Glucose, Bld 91 70 - 99 mg/dL    Comment: Glucose reference range applies only to samples taken after fasting for at least 8 hours.   BUN 79 (H) 8 - 23 mg/dL   Creatinine, Ser 4.40 (H) 0.61 - 1.24 mg/dL   Calcium 8.0 (L) 8.9 - 10.3 mg/dL   Phosphorus 8.9 (H) 2.5 - 4.6 mg/dL   Albumin 2.0 (L) 3.5 - 5.0 g/dL   GFR, Estimated 7 (L) >60 mL/min    Comment:  (NOTE) Calculated using the CKD-EPI Creatinine Equation (2021)    Anion gap 14 5 - 15    Comment: Performed at [facility] Lab, [address], [place], [place] [address]  Magnesium  Status: Abnormal   Collection Time: 10/01/23  3:39 AM  Result Value Ref Range   Magnesium 2.6 (H) 1.7 - 2.4 mg/dL    Comment: Performed at [facility], [address] [place], [place] [address]  Culture, blood (Routine X 2) w Reflex to ID Panel     Status: None (Preliminary result)   Collection Time: 10/01/23  9:05 AM   Specimen: BLOOD LEFT HAND  Result Value Ref Range   Specimen Description BLOOD LEFT HAND    Special Requests      BOTTLES DRAWN AEROBIC AND ANAEROBIC Blood Culture adequate volume   Culture      NO GROWTH 1 DAY Performed at [facility], [address] [place], [place] [address]    Report Status PENDING   Culture, blood (Routine X 2) w Reflex to ID Panel     Status: None (Preliminary result)   Collection Time: 10/01/23  9:05 AM   Specimen: BLOOD LEFT HAND  Result Value Ref Range   Specimen Description BLOOD LEFT HAND    Special Requests      BOTTLES DRAWN AEROBIC AND ANAEROBIC Blood Culture adequate volume   Culture      NO GROWTH 1 DAY Performed at [facility], [address] [place], [place] [address]    Report Status PENDING   Renal function panel     Status: Abnormal   Collection Time: 10/02/23  2:43 AM  Result Value Ref Range   Sodium 135 135 - 145 mmol/L   Potassium 3.8 3.5 - 5.1 mmol/L   Chloride 97 (L) 98 - 111 mmol/L   CO2 26 22 - 32 mmol/L   Glucose, Bld 95 70 - 99 mg/dL    Comment: Glucose reference range applies only to samples taken after fasting for at least 8 hours.   BUN 48 (H) 8 - 23 mg/dL   Creatinine, Ser 9.14 (H) 0.61 - 1.24 mg/dL   Calcium 7.8 (L) 8.9 - 10.3 mg/dL   Phosphorus 5.7 (H) 2.5 - 4.6 mg/dL   Albumin 2.0 (L) 3.5 - 5.0 g/dL   GFR, Estimated 11 (L) >60 mL/min    Comment: (NOTE) Calculated using the CKD-EPI  Creatinine Equation (2021)    Anion gap 12 5 - 15    Comment: Performed at [facility], [address], [place], [place] [address]  Magnesium     Status: None   Collection Time: 10/02/23  2:43 AM  Result Value Ref Range   Magnesium 2.1 1.7 - 2.4 mg/dL    Comment: Performed at [facility], [address], [place], [place] [address]  CBC with Differential/Platelet     Status: Abnormal   Collection Time: 10/02/23  2:43 AM  Result Value Ref Range   WBC 22.4 (H) 4.0 - 10.5 K/uL   RBC 3.18 (L) 4.22 - 5.81 MIL/uL   Hemoglobin 9.4 (L) 13.0 - 17.0 g/dL   HCT 86.5 (L) 78.4 - 69.6 %   MCV 91.5 80.0 - 100.0 fL   MCH 29.6 26.0 - 34.0 pg   MCHC 32.3 30.0 - 36.0 g/dL   RDW 29.5 (H) 28.4 - 13.2 %   Platelets 572 (H) 150 - 400 K/uL   nRBC 0.1 0.0 - 0.2 %   Neutrophils Relative % 83 %   Neutro Abs 18.2 (H) 1.7 - 7.7 K/uL   Lymphocytes Relative 4 %   Lymphs Abs 1.0 0.7 - 4.0 K/uL   Monocytes  Relative 7 %   Monocytes Absolute 1.7 (H) 0.1 - 1.0 K/uL   Eosinophils Relative 1 %   Eosinophils Absolute 0.3 0.0 - 0.5 K/uL   Basophils Relative 0 %   Basophils Absolute 0.1 0.0 - 0.1 K/uL   Immature Granulocytes 5 %   Abs Immature Granulocytes 1.22 (H) 0.00 - 0.07 K/uL    Comment: Performed at [facility], [address], [place], [place] [address]  Procalcitonin     Status: None   Collection Time: 10/02/23  2:43 AM  Result Value Ref Range   Procalcitonin 0.64 ng/mL    Comment:        Interpretation: PCT > 0.5 ng/mL and <= 2 ng/mL: Systemic infection (sepsis) is possible, but other conditions are known to elevate PCT as well. (NOTE)       Sepsis PCT Algorithm           Lower Respiratory Tract                                      Infection PCT Algorithm    ----------------------------     ----------------------------         PCT < 0.25 ng/mL                PCT < 0.10 ng/mL          Strongly encourage             Strongly discourage   discontinuation of antibiotics     initiation of antibiotics    ----------------------------     -----------------------------       PCT 0.25 - 0.50 ng/mL            PCT 0.10 - 0.25 ng/mL               OR       >80% decrease in PCT            Discourage initiation of                                            antibiotics      Encourage discontinuation           of antibiotics    ----------------------------     -----------------------------         PCT >= 0.50 ng/mL              PCT 0.26 - 0.50 ng/mL                AND       <80% decrease in PCT             Encourage initiation of                                             antibiotics       Encourage continuation           of antibiotics    ----------------------------     -----------------------------        PCT >= 0.50 ng/mL                  PCT >  0.50 ng/mL               AND         increase in PCT                  Strongly encourage                                      initiation of antibiotics    Strongly encourage escalation           of antibiotics                                     -----------------------------                                           PCT <= 0.25 ng/mL                                                 OR                                        > 80% decrease in PCT                                      Discontinue / Do not initiate                                             antibiotics  Performed at [facility], [address] [place], [place] [address]   Glucose, capillary     Status: Abnormal   Collection Time: 10/02/23 11:05 AM  Result Value Ref Range   Glucose-Capillary 129 (H) 70 - 99 mg/dL    Comment: Glucose reference range applies only to samples taken after fasting for at least 8 hours.  Glucose, capillary     Status: Abnormal   Collection Time: 10/02/23 12:29 PM  Result Value Ref Range   Glucose-Capillary 118 (H) 70 - 99 mg/dL    Comment: Glucose reference range applies only to samples taken after fasting for at  least 8 hours.  CBC with Differential     Status: Abnormal   Collection Time: 10/02/23  1:45 PM  Result Value Ref Range   WBC 26.7 (H) 4.0 - 10.5 K/uL   RBC 3.10 (L) 4.22 - 5.81 MIL/uL   Hemoglobin 9.0 (L) 13.0 - 17.0 g/dL   HCT 60.4 (L) 54.0 - 98.1 %   MCV 91.0 80.0 - 100.0 fL   MCH 29.0 26.0 - 34.0 pg   MCHC 31.9 30.0 - 36.0 g/dL   RDW 19.1 (H) 47.8 - 29.5 %   Platelets 603 (H) 150 - 400 K/uL   nRBC 0.1 0.0 - 0.2 %   Neutrophils Relative % 84 %  Neutro Abs 22.4 (H) 1.7 - 7.7 K/uL   Lymphocytes Relative 3 %   Lymphs Abs 0.8 0.7 - 4.0 K/uL   Monocytes Relative 7 %   Monocytes Absolute 1.8 (H) 0.1 - 1.0 K/uL   Eosinophils Relative 0 %   Eosinophils Absolute 0.1 0.0 - 0.5 K/uL   Basophils Relative 0 %   Basophils Absolute 0.1 0.0 - 0.1 K/uL   WBC Morphology Mild Left Shift (1-5% metas, occ myelo)    RBC Morphology MORPHOLOGY UNREMARKABLE    Smear Review MORPHOLOGY UNREMARKABLE    Immature Granulocytes 6 %   Abs Immature Granulocytes 1.57 (H) 0.00 - 0.07 K/uL    Comment: Performed at [facility], [address], [place], [place] [address]  Comprehensive metabolic panel     Status: Abnormal   Collection Time: 10/02/23  1:45 PM  Result Value Ref Range   Sodium 136 135 - 145 mmol/L   Potassium 4.1 3.5 - 5.1 mmol/L   Chloride 98 98 - 111 mmol/L   CO2 22 22 - 32 mmol/L   Glucose, Bld 135 (H) 70 - 99 mg/dL    Comment: Glucose reference range applies only to samples taken after fasting for at least 8 hours.   BUN 53 (H) 8 - 23 mg/dL   Creatinine, Ser 1.91 (H) 0.61 - 1.24 mg/dL   Calcium 7.7 (L) 8.9 - 10.3 mg/dL   Total Protein 6.1 (L) 6.5 - 8.1 g/dL   Albumin 1.9 (L) 3.5 - 5.0 g/dL   AST 34 15 - 41 U/L   ALT 23 0 - 44 U/L   Alkaline Phosphatase 86 38 - 126 U/L   Total Bilirubin 1.3 (H) 0.3 - 1.2 mg/dL   GFR, Estimated 10 (L) >60 mL/min    Comment: (NOTE) Calculated using the CKD-EPI Creatinine Equation (2021)    Anion gap 16 (H) 5 - 15    Comment: Performed at [facility], [address], [place], [place] [address]  Lactic acid, plasma     Status: Abnormal   Collection Time: 10/02/23  1:45 PM  Result Value Ref Range   Lactic Acid, Venous 3.0 (HH) 0.5 - 1.9 mmol/L    Comment: CRITICAL RESULT CALLED TO, READ BACK BY AND VERIFIED WITH C BAYNES RN 10/02/2023 1439 BNUNNERY Performed at [facility], [address] [place], [place] [address]   Urinalysis, Routine w reflex microscopic -Urine, Catheterized; Indwelling urinary catheter     Status: Abnormal   Collection Time: 10/02/23  1:45 PM  Result Value Ref Range   Color, Urine RED (A) YELLOW    Comment: BIOCHEMICALS MAY BE AFFECTED BY COLOR   APPearance TURBID (A) CLEAR   Specific Gravity, Urine  1.005 - 1.030    TEST NOT REPORTED DUE TO COLOR INTERFERENCE OF URINE PIGMENT    Comment: CORRECTED ON 10/03 AT 1540: PREVIOUSLY REPORTED AS >1.030   pH  5.0 - 8.0    TEST NOT REPORTED DUE TO COLOR INTERFERENCE OF URINE PIGMENT    Comment: CORRECTED ON 10/03 AT 1540: PREVIOUSLY REPORTED AS 6.5   Glucose, UA (A) NEGATIVE mg/dL    TEST NOT REPORTED DUE TO COLOR INTERFERENCE OF URINE PIGMENT    Comment: CORRECTED ON 10/03 AT 1540: PREVIOUSLY REPORTED AS NEGATIVE   Hgb urine dipstick (A) NEGATIVE    TEST NOT REPORTED DUE TO COLOR INTERFERENCE OF URINE PIGMENT    Comment: CORRECTED ON 10/03 AT 1540: PREVIOUSLY REPORTED AS LARGE  Bilirubin Urine (A) NEGATIVE    TEST NOT REPORTED DUE TO COLOR INTERFERENCE OF URINE PIGMENT    Comment: CORRECTED ON 10/03 AT 1540: PREVIOUSLY REPORTED AS MODERATE   Ketones, ur (A) NEGATIVE mg/dL    TEST NOT REPORTED DUE TO COLOR INTERFERENCE OF URINE PIGMENT    Comment: CORRECTED ON 10/03 AT 1540: PREVIOUSLY REPORTED AS NEGATIVE   Protein, ur (A) NEGATIVE mg/dL    TEST NOT REPORTED DUE TO COLOR INTERFERENCE OF URINE PIGMENT    Comment: CORRECTED ON 10/03 AT 1540: PREVIOUSLY REPORTED AS 100   Nitrite (A) NEGATIVE    TEST NOT REPORTED DUE TO COLOR INTERFERENCE OF URINE  PIGMENT    Comment: CORRECTED ON 10/03 AT 1540: PREVIOUSLY REPORTED AS POSITIVE   Leukocytes,Ua (A) NEGATIVE    TEST NOT REPORTED DUE TO COLOR INTERFERENCE OF URINE PIGMENT    Comment: Performed at [facility], [address], [place], [place] [address] CORRECTED ON 10/03 AT 1540: PREVIOUSLY REPORTED AS MODERATE   Protime-INR     Status: Abnormal   Collection Time: 10/02/23  1:45 PM  Result Value Ref Range   Prothrombin Time 21.7 (H) 11.4 - 15.2 seconds   INR 1.9 (H) 0.8 - 1.2    Comment: (NOTE) INR goal varies based on device and disease states. Performed at [facility], [address], [place], [place] [address]   APTT     Status: None   Collection Time: 10/02/23  1:45 PM  Result Value Ref Range   aPTT 34 24 - 36 seconds    Comment: Performed at [facility], [address], [place], [place] [address]  Urinalysis, Microscopic (reflex)     Status: Abnormal   Collection Time: 10/02/23  1:45 PM  Result Value Ref Range   RBC / HPF >50 0 - 5 RBC/hpf   WBC, UA 11-20 0 - 5 WBC/hpf   Bacteria, UA MANY (A) NONE SEEN   Squamous Epithelial / HPF 0-5 0 - 5 /HPF    Comment: Performed at [facility], [address] [place], [place] [address]  Lactic acid, plasma     Status: None   Collection Time: 10/02/23  5:34 PM  Result Value Ref Range   Lactic Acid, Venous 1.8 0.5 - 1.9 mmol/L    Comment: Performed at [facility], [address] [place], [place] [address]    CT Angio Chest/Abd/Pel for Dissection W and/or W/WO  Result Date: 10/02/2023 CLINICAL DATA:  Chest pain and thickening of the ascending aorta on prior noncontrast study., subsequent encounter EXAM: CT ANGIOGRAPHY CHEST, ABDOMEN AND PELVIS TECHNIQUE: Non-contrast CT of the chest was initially obtained. Multidetector CT imaging through the chest, abdomen and pelvis was performed using the standard protocol during bolus administration of intravenous contrast. Multiplanar reconstructed images and  MIPs were obtained and reviewed to evaluate the vascular anatomy. RADIATION DOSE REDUCTION: This exam was performed according to the departmental dose-optimization program which includes automated exposure control, adjustment of the mA and/or kV according to patient size and/or use of iterative reconstruction technique. CONTRAST:  80mL OMNIPAQUE IOHEXOL 350 MG/ML SOLN COMPARISON:  Noncontrast study from the previous day. FINDINGS: CTA CHEST FINDINGS Cardiovascular: Precontrast images demonstrate heavy calcification of the thoracic aorta. No hyperdense crescent to suggest acute aortic injury is noted. Postcontrast images show a focal area of contrast outpouching along the anterior aspect of the ascending aorta. This has the appearance of a penetrating ulcer. No extravasation of  contrast is seen. The thoracic aortic arch and descending aorta are within normal limits. Coronary calcifications are noted. Large pericardial effusion is noted which appears mildly hyperdense compared to the cardiac blood pool on the noncontrast images. Given the findings in the ascending aorta, the possibility of prior hemorrhage into the pericardial sac would deserve consideration. Pulmonary artery shows a normal branching pattern without intraluminal filling defect to suggest pulmonary embolism. Right jugular dialysis catheter is noted as is right-sided PICC. Mediastinum/Nodes: Thoracic inlet is within normal limits. No hilar or mediastinal adenopathy is seen. Some small likely reactive lymph nodes are noted in the mediastinum. The esophagus as visualized is within normal limits. Lungs/Pleura: Large left-sided pleural effusion is noted with underlying lower lobe compressive atelectasis. The remainder of the left lung is within normal limits. Right lung is predominately well aerated. Small effusion is seen inferiorly with mild compressive atelectasis. Emphysematous changes are seen. Musculoskeletal: Prior rib fractures with surgical  fixation are noted on the right. No compression deformities are seen. Review of the MIP images confirms the above findings. CTA ABDOMEN AND PELVIS FINDINGS VASCULAR Aorta: Abdominal aorta demonstrates no aneurysmal dilatation. Heavy atherosclerotic calcifications are seen with mild mural thrombus. No dissection is noted. Celiac: Proximal narrowing of the celiac axis is noted with mild poststenotic dilatation. SMA: Calcific changes are noted at the origin of the SMA although the vessel is patent. Renals: Single renal arteries are identified bilaterally but somewhat diminutive consistent with the known end-stage renal disease. IMA: Patent without evidence of aneurysm, dissection, vasculitis or significant stenosis. Inflow: Iliacs demonstrate heavy atherosclerotic calcifications. Focal high-grade stenosis at the origin of the left common iliac artery is noted. No other focal stenosis is seen. Veins: No specific venous abnormality is noted. Review of the MIP images confirms the above findings. NON-VASCULAR Hepatobiliary: No focal liver abnormality is seen. No gallstones, gallbladder wall thickening, or biliary dilatation. Pancreas: Unremarkable. No pancreatic ductal dilatation or surrounding inflammatory changes. Spleen: Normal in size without focal abnormality. Adrenals/Urinary Tract: Adrenal glands are within normal limits. Kidneys are somewhat thinned consistent with the known end-stage renal disease. No renal calculi or obstructive changes are noted. The bladder is decompressed by Foley catheter. Stomach/Bowel: No obstructive or inflammatory changes of the colon are noted. The appendix is within normal limits. Small bowel and stomach unremarkable. Lymphatic: No lymphadenopathy is noted. Reproductive: Prostate is unremarkable. Other: Mild stranding is noted along the anterior aspect of the distal abdominal aorta extending into the pelvis similar to that seen on the prior exam but of uncertain significance.  Musculoskeletal: No acute or significant osseous findings. Review of the MIP images confirms the above findings. IMPRESSION: CTA of the chest: No evidence of pulmonary emboli. Findings consistent with a penetrating ulcer in the ascending aorta anteriorly. This has a somewhat lobulated appearance. So she aided mildly hyperdense pericardial effusion is noted. The possibility of prior hemorrhage into the pericardial space would deserve consideration. Vascular surgery consultation is recommended. Bilateral pleural effusions left considerably greater than right with associated compressive atelectasis. CTA of the abdomen and pelvis: Narrowing of the celiac axis proximally with mild poststenotic dilatation. High-grade stenosis of the left common iliac artery just beyond its origin. No other focal abnormality is noted. These results will be called to the ordering clinician or representative by the Radiologist Assistant, and communication documented in the PACS or [employer]. Electronically Signed   By: Alcide Clever M.D.   On: 10/02/2023 20:10   CT CHEST ABDOMEN PELVIS WO CONTRAST  Result  Date: 10/02/2023 CLINICAL DATA:  Intentional weight loss, abdominal pain. EXAM: CT CHEST, ABDOMEN AND PELVIS WITHOUT CONTRAST TECHNIQUE: Multidetector CT imaging of the chest, abdomen and pelvis was performed following the standard protocol without IV contrast. RADIATION DOSE REDUCTION: This exam was performed according to the departmental dose-optimization program which includes automated exposure control, adjustment of the mA and/or kV according to patient size and/or use of iterative reconstruction technique. COMPARISON:  September 20, 2023. FINDINGS: CT CHEST FINDINGS Cardiovascular: Atherosclerosis of thoracic aorta is noted without aneurysm formation. Mild cardiomegaly is noted. Moderate pericardial effusion is noted. Mild wall thickening of ascending thoracic aorta is noted; intramural hematoma cannot be excluded. Coronary  artery calcifications are noted. Mediastinum/Nodes: No enlarged mediastinal, hilar, or axillary lymph nodes. Thyroid gland, trachea, and esophagus demonstrate no significant findings. Lungs/Pleura: No pneumothorax is noted. Large multiloculated left pleural effusion is noted. Smaller loculated right pleural effusion is noted in right lung base extending into major fissure. Musculoskeletal: Status post surgical internal fixation of multiple right ribs. No acute osseous abnormality is noted. CT ABDOMEN PELVIS FINDINGS Hepatobiliary: No focal liver abnormality is seen. No gallstones, gallbladder wall thickening, or biliary dilatation. Pancreas: Unremarkable. No pancreatic ductal dilatation or surrounding inflammatory changes. Spleen: Normal in size without focal abnormality. Adrenals/Urinary Tract: Adrenal glands and kidneys appear normal. Urinary bladder is decompressed secondary to Foley catheter. Moderate wall thickening is noted which may be due to lack of distension, but cystitis cannot be excluded. Stomach/Bowel: Stomach is within normal limits. Appendix appears normal. No evidence of bowel wall thickening, distention, or inflammatory changes. Vascular/Lymphatic: Aortic atherosclerosis. No enlarged abdominal or pelvic lymph nodes. Reproductive: Mild prostatic enlargement. Other: Small fat containing right inguinal hernia. Minimal fluid or inflammatory stranding is noted posteriorly in the pelvis of uncertain etiology. Musculoskeletal: No acute or significant osseous findings. IMPRESSION: Mild wall thickening of ascending thoracic aorta is noted. This is concerning for possible intramural hematoma, and CT angiography of the chest is recommended for further evaluation, if the patient's renal status will allow the administration of intravenous contrast. These results will be called to the ordering clinician or representative by the Radiologist Assistant, and communication documented in the PACS or zVision Dashboard.  Moderate pericardial effusion is noted. Large multiloculated left pleural effusion is noted. Smaller loculated pleural effusion is noted in right lung base. Urinary bladder is decompressed secondary to Foley catheter. Moderate wall thickening is noted which may be due to lack of distension, but cystitis cannot be excluded. Minimal fluid or inflammatory stranding is noted posteriorly in the pelvis of uncertain etiology. Mild prostatic enlargement. Coronary artery calcifications are noted suggesting coronary artery disease. Aortic Atherosclerosis (ICD10-I70.0). Electronically Signed   By: Lupita Raider M.D.   On: 10/02/2023 14:36   DG Chest Port 1 View  Result Date: 10/02/2023 CLINICAL DATA:  Sepsis EXAM: PORTABLE CHEST 1 VIEW COMPARISON:  09/30/2023 FINDINGS: Large left pleural effusion likely with loculated components. Small right pleural effusion along the costophrenic angle and minor fissure. Right IJ central line tip: Cavoatrial junction. Right PICC line tip: Lower SVC. Atheromatous vascular calcification of the thoracic aorta. Moderate enlargement of the cardiopericardial silhouette. Substantial passive atelectasis vertically on the left side due to the prominent multiloculated effusion. Right rib plates noted. IMPRESSION: 1. Large left pleural effusion likely with loculated components and substantial passive atelectasis. 2. Small right pleural effusion along the costophrenic angle and minor fissure. 3. Moderate enlargement of the cardiopericardial silhouette. 4. Right IJ central line tip: Cavoatrial junction. Right PICC line tip:  Lower SVC. Electronically Signed   By: Gaylyn Rong M.D.   On: 10/02/2023 13:34    Review of Systems  Constitutional:  Positive for activity change and fatigue.  Respiratory:  Positive for shortness of breath.   Gastrointestinal:  Positive for abdominal pain (PTA).   Blood pressure 124/65, pulse (!) 103, temperature 97.9 F (36.6 C), temperature source Oral, resp.  rate 16, height 5\' 10"  (1.778 m), weight 110 kg, SpO2 99%. Physical Exam Vitals reviewed.  Constitutional:      Appearance: He is obese. He is ill-appearing.  HENT:     Head: Normocephalic and atraumatic.  Eyes:     Extraocular Movements: Extraocular movements intact.  Neck:     Comments: No JVD Cardiovascular:     Rate and Rhythm: Tachycardia present. Rhythm irregular.     Heart sounds:     No friction rub.  Pulmonary:     Comments: Diminished BS on left Abdominal:     Palpations: Abdomen is soft.     Tenderness: There is no abdominal tenderness.  Skin:    Comments: Cool and dry  Neurological:     General: No focal deficit present.     Mental Status: He is oriented to person, place, and time.     Assessment/Plan: Timothy Mata is an 89 yo man with a history of atrial flutter, MI, hypertension, hyperlipidemia, stage IIIB CKD, COPD, gout who was admitted on 10/18/23 with abdominal pain and urinary frequency.  Admitted with abdominal pain about 10 days ago and suffered a PEA arrest. While in hospital treated for UTI and atrial fib/ flutter.  Today developed hypotension and tachycardia- a fib/ flutter again.  Now has CT showing an intramural hematoma of the ascending aorta with thrombus and likely clot in the pericardium as well.  No evidence of tamponade by echo.  Also has a large loculated left pleural effusion.  I suspect the intramural hematoma was the source of his pain and initial PEA arrest.  Likely had some extension today resulting in his worsening condition.    He is elderly, frail, has an elevated WBC, has gone into acute renal failure requiring dialysis this admission.  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  In my opinion he is not a candidate for surgery on his ascending aorta.  His risk is prohibitive and even if we were somehow able to reconstruct his aorta (questionable) he would not a have an acceptable  quality of life afterwards.   I discussed these issues with his wife Kendal Hymen and his niece, Winifred Olive (at his wife's request).  Also d/w Dr. Vladimir Faster.  They are aware of the gravity of the situation and the poor prognosis. Strongly suggested they discuss code status.  Loreli Slot 10/02/2023, 9:52 PM

## 2023-10-02 NOTE — Sepsis Progress Note (Signed)
Sepsis protocol monitored by eLink ?

## 2023-10-02 NOTE — Progress Notes (Signed)
Palliative:  HPI: 89 y.o. male   admitted on 10-14-23  with PMH a-fib on Eliquis, COPD, HFrEF, HTN, HLD who initially presented to [facility] on 10/14/2023. Has had increased urinary frequency. CXR with mild pulm edema. CT ABD/pelvis no acute abnormality; mild left hydroureteronephrosis; small bilateral pleural effusions. WBC elevated. UA with nitrite and large leukocytes. Cultures obtained and given IV fluids. Patient started on Rocephin. Due to worsening hypotension, he was transferred to the ICU.  He suffered PEA arrest with ROSC after 10 minutes.  He was then transferred to [facility]. He was able to be extubated on 09/22/2023. He developed decreased UOP and ATN after his cardiac event.  Dialysis has been initiated. Today is day 11 of this hospitalization.  Slow progress regarding physical therapy goals.  He remains weak and deconditioned, concern for long-term poor prognosis. Patient and family face treatment option decisions, advanced directive decisions and anticipatory care needs.  Notified of rapid response and declining status by Dr. Hanley Ben. I met with Mr. Boyte but no family at bedside. He is a very pleasant gentleman in a difficult situation. Appears to have recurrent infection requiring ICU for vasopressor support. We reviewed his difficult hospitalization and severity of his illness. Mr. Noah reassures me that he does not worry as it does not good. We reviewed that his body has been through a lot and that if he were to require life support again I worry he may not be strong enough to improve - he has been struggling since his initial event. Mr. Buckle understands. He tells me "I'm ready when my ticket is up." He fluctuates about his wishes for reintubation and code status but agrees to proceed with family meeting tomorrow to discuss further. He will remain full code for now. He is clear that he would not want to remain on life support if not improving. He talks with relative ease about dying  and end of life. Hopefully we can expand on this conversation tomorrow with support of his family.   All questions/concerns addressed. Emotional support provided.   Exam: Alert, oriented. Breathing labored with tachypnea. Abd soft but large - pushing up on diaphragm likely making breathing worse. Generalized weakness and fatigue.   Plan: - Full code - further discussion tomorrow - Family meeting previously arranged for tomorrow 10/4 1400  40 min  Yong Channel, NP Palliative Medicine Team Pager [phone] (Please see [email or web address] for schedule) Team Phone [phone]    Greater than 50%  of this time was spent counseling and coordinating care related to the above assessment and plan

## 2023-10-02 NOTE — Significant Event (Signed)
Rapid Response Event Note   Reason for Call :  hypotension  Initial Focused Assessment:  Patient is drowsy but oriented.  He states that he "feels ok" and is not hurting.  He denies abdominal pain. Cool and clammy, pale. Increased work of breathing but denies shortness of breath.  BP 77/52  AF 100-110s  RR 22-30  Temp 97.4 Axillary CBG 129  Interventions:  Sepsis order set placed,  md asked that blood cultures not be ordered since they were collected yesterday.  1 L  NS bolus Levophed gtt started at  CCM at bedside to assess patient Transferred to ICU   Plan of Care:     Event Summary:   MD Notified: Hanley Ben Call Time: 1053 Arrival Time: 1055 End Time:  1220  Marcellina Millin, RN

## 2023-10-02 NOTE — Progress Notes (Signed)
PROGRESS NOTE    Timothy Mata  WUJ:[id number] DOB: [birth year] DOA: 10-14-2023 PCP: Sheela Stack   Brief Narrative:  89 yo male with PMH aflutter on Eliquis, COPD, HFrEF, HTN, HLD who initially presented to [facility] on 10-14-2023 with increased urinary frequency.  Chest x-ray had shown mild pulmonary edema.  CT abdomen/pelvis showed mild left hydroureteronephrosis; small bilateral pleural effusions.  UA was suggestive of UTI.  He was started on IV fluids and antibiotics.  Due to worsening hypotension, he was transferred to ICU.  He subsequently suffered PEA arrest with ROSC after 10 minutes.  He was then transferred to [facility], ICU.  He was extubated on 09/22/2023.  He was found to have hematuria requiring Foley catheter exchange on 09/19/2023.  Nephrology was consulted for AKI.  Urology was also consulted on 09/30/2023 for hematuria.  Assessment & Plan:   Septic shock: Present on admission -Resolved.  Due to UTI on admission.  Urine culture was indeterminate.  Patient completed a 5-day course of Rocephin  Leukocytosis -Unclear cause.  WBCs has worsened to 22.4 today.  Procalcitonin 1.12 on 09/30/2023.  Had completed Rocephin on 09/26/2023.  Check blood cultures.  UA and urine culture pending.  Chest x-ray on 09/30/2023 showed increased  left pleural effusion.  If WBCs continues to worsen, will proceed with CT of chest abdomen and pelvis. -Might be reactive from PEA arrest -Remains afebrile  Hematuria BPH -Foley catheter exchanged on 09/26/2023 -Pelvic ultrasound on 09/19/2023 showed decompression bladder but still concern for some blood products although may be small amount after discussion with urology -Urology following: Recommending to DC catheter will not be needed for acute UOP monitoring.  Aspirin and Eliquis on hold. -Continue Flomax and finasteride  Acute renal failure superimposed on CKD stage IIIb -Baseline creatinine of 1.92. -Nephrology following.  First  hemodialysis started 09/27/2023.  Had hemodialysis again on 09/30/2023.  Creatinine 4.69 today.  Monitor.  Follow nephrology recommendations. -RIJ placed by IR on 9/27 for HD access.  Pleural effusion on left -Moderate to large left pleural effusion on chest x-ray on 09/24/2023: Status post thoracentesis on 09/25/2023: Noted to be bloody fluid and was exudative by criteria so likely this was hemothorax -Chest x-ray on 09/30/2023 showed increasing left pleural effusion.  Monitor.  Acute respiratory failure with hypoxia -In the setting of cardiac arrest; extubated on 09/22/2023. -Currently intermittently on 2 L oxygen by nasal cannula  Anemia of chronic disease/normocytic anemia -Hemoglobin currently stable  Thrombocytosis -Possibly reactive.  Monitor intermittently.  Paroxysmal A-fib -Eliquis and aspirin on hold.  Continue amiodarone.  Rate controlled  COPD -Stable.  Chronic systolic heart failure Hypertension -Prior EF of 45% in June 2022 per notes.  Echo during this hospitalization showed EF of 60 to 65%. -Continue strict input and output.  Daily weights.  Fluid restriction.  Outpatient follow-up with cardiology.  Diuretics as per nephrology  Hyperlipidemia -Continue Lipitor  Physical deconditioning Goals of care -Overall prognosis is guarded to poor.  Palliative care following.  Remains full code.  DVT prophylaxis: Eliquis on hold Code Status: Full Family Communication: None at bedside Disposition Plan: Status is: Inpatient Remains inpatient appropriate because: Of severity of illness    Consultants: PCCM/nephrology/IR/urology/palliative care  Procedures: As above  Antimicrobials:  Anti-infectives (From admission, onward)    Start     Dose/Rate Route Frequency Ordered Stop   09/23/23 1300  vancomycin (VANCOREADY) IVPB 1500 mg/300 mL  Status:  Discontinued        1,500 mg 150  mL/hr over 120 Minutes Intravenous Every 48 hours 09/21/23 1339 09/22/23 0932   09/22/23  1200  cefTRIAXone (ROCEPHIN) 2 g in sodium chloride 0.9 % 100 mL IVPB        2 g 200 mL/hr over 30 Minutes Intravenous Every 24 hours 09/22/23 0934 09/26/23 1257   09/21/23 2200  piperacillin-tazobactam (ZOSYN) IVPB 3.375 g  Status:  Discontinued        3.375 g 12.5 mL/hr over 240 Minutes Intravenous Every 8 hours 09/21/23 1319 09/22/23 0932   09/21/23 1500  cefTRIAXone (ROCEPHIN) 1 g in sodium chloride 0.9 % 100 mL IVPB  Status:  Discontinued        1 g 200 mL/hr over 30 Minutes Intravenous Every 24 hours 09/17/2023 1713 09/21/23 1245   09/21/23 1500  cefTRIAXone (ROCEPHIN) 2 g in sodium chloride 0.9 % 100 mL IVPB  Status:  Discontinued        2 g 200 mL/hr over 30 Minutes Intravenous Every 24 hours 09/21/23 1245 09/21/23 1251   09/21/23 1400  vancomycin (VANCOREADY) IVPB 2000 mg/400 mL        2,000 mg 200 mL/hr over 120 Minutes Intravenous  Once 09/21/23 1252 09/21/23 1639   09/21/23 1345  piperacillin-tazobactam (ZOSYN) IVPB 3.375 g        3.375 g 100 mL/hr over 30 Minutes Intravenous  Once 09/21/23 1252 09/21/23 1354   09/06/2023 1530  cefTRIAXone (ROCEPHIN) 1 g in sodium chloride 0.9 % 100 mL IVPB        1 g 200 mL/hr over 30 Minutes Intravenous  Once 09/25/2023 1519 09/02/2023 1714        Subjective: Patient seen and examined at bedside.  No vomiting no fever, agitation or seizures reported.  Still feels very weak. Objective: Vitals:   10/01/23 2345 10/01/23 2353 10/02/23 0357 10/02/23 0500  BP: 129/65 116/75 124/75   Pulse: (!) 36 (!) 124 (!) 107   Resp: (!) 22 20 18    Temp:  97.7 F (36.5 C)    TempSrc:  Axillary    SpO2: 99% 100% 100%   Weight:    111.7 kg  Height:        Intake/Output Summary (Last 24 hours) at 10/02/2023 0719 Last data filed at 10/01/2023 2353 Gross per 24 hour  Intake 40 ml  Output 1800 ml  Net -1760 ml   Filed Weights   09/30/23 0727 10/01/23 0500 10/02/23 0500  Weight: 114.1 kg 113.4 kg 111.7 kg    Examination:  General: On 2 L oxygen via  nasal cannula intermittently.  No distress.  Chronically ill and deconditioned looking ENT/neck: No thyromegaly.  JVD is not elevated  respiratory: Decreased breath sounds at bases bilaterally with some crackles; no wheezing  CVS: S1-S2 heard, mild intermittent tachycardia present  abdominal: Soft, nontender, slightly distended; no organomegaly, bowel sounds are heard Extremities: Trace lower extremity edema; no cyanosis  CNS: Awake and alert.  Still slow to respond and a poor historian.  No focal neurologic deficit.  Moves extremities Lymph: No obvious lymphadenopathy Skin: No obvious ecchymosis/lesions  psych: Mostly flat affect.  Currently not agitated.   Musculoskeletal: No obvious joint swelling/deformity Genitourinary: Foley catheter present     Data Reviewed: I have personally reviewed following labs and imaging studies  CBC: Recent Labs  Lab 09/28/23 0319 09/29/23 0413 09/30/23 0216 10/01/23 0339 10/02/23 0243  WBC 16.7* 17.5* 20.6* 22.3* 22.4*  NEUTROABS 12.9* 13.6* 18.5* 17.8* 18.2*  HGB 8.3* 8.8* 9.0* 9.3* 9.4*  HCT  26.0* 27.8* 28.0* 28.9* 29.1*  MCV 90.6 92.4 89.2 91.7 91.5  PLT 488* 534* 577* 617* 572*   Basic Metabolic Panel: Recent Labs  Lab 09/28/23 0319 09/29/23 0413 09/30/23 0216 10/01/23 0339 10/02/23 0243  NA 137 137 135 136 135  K 3.8 3.8 3.9 4.0 3.8  CL 101 103 97* 100 97*  CO2 21* 19* 22 22 26   GLUCOSE 104* 103* 103* 91 95  BUN 86* 97* 68* 79* 48*  CREATININE 7.81* 8.20* 6.37* 6.99* 4.69*  CALCIUM 7.9* 8.0* 7.8* 8.0* 7.8*  MG 2.7* 2.8* 2.3 2.6* 2.1  PHOS 8.9* 9.1* 7.5* 8.9* 5.7*   GFR: Estimated Creatinine Clearance: 13.9 mL/min (A) (by C-G formula based on SCr of 4.69 mg/dL (H)). Liver Function Tests: Recent Labs  Lab 09/28/23 0319 09/29/23 0413 09/30/23 0216 10/01/23 0339 10/02/23 0243  ALBUMIN 2.1* 2.0* 2.0* 2.0* 2.0*   No results for input(s): "LIPASE", "AMYLASE" in the last 168 hours. No results for input(s): "AMMONIA" in  the last 168 hours. Coagulation Profile: No results for input(s): "INR", "PROTIME" in the last 168 hours. Cardiac Enzymes: No results for input(s): "CKTOTAL", "CKMB", "CKMBINDEX", "TROPONINI" in the last 168 hours. BNP (last 3 results) No results for input(s): "PROBNP" in the last 8760 hours. HbA1C: No results for input(s): "HGBA1C" in the last 72 hours. CBG: Recent Labs  Lab 09/30/23 0433 09/30/23 0724 09/30/23 1140 09/30/23 1602 09/30/23 2110  GLUCAP 97 97 109* 116* 132*   Lipid Profile: No results for input(s): "CHOL", "HDL", "LDLCALC", "TRIG", "CHOLHDL", "LDLDIRECT" in the last 72 hours. Thyroid Function Tests: No results for input(s): "TSH", "T4TOTAL", "FREET4", "T3FREE", "THYROIDAB" in the last 72 hours. Anemia Panel: Recent Labs    09/29/23 0809  FERRITIN 387*  TIBC 206*  IRON 20*   Sepsis Labs: Recent Labs  Lab 09/30/23 0215  PROCALCITON 1.12    No results found for this or any previous visit (from the past 240 hour(s)).        Radiology Studies: DG CHEST PORT 1 VIEW  Result Date: 09/30/2023 CLINICAL DATA:  Lymphocytosis, hemothorax EXAM: PORTABLE CHEST - 1 VIEW COMPARISON:  09/25/2023 FINDINGS: Right IJ central line placement to the mid right atrium. No pneumothorax. Stable right arm PICC. Increasing left pleural effusion surrounding and compressing the left lung particularly at the base. Heart size upper limits normal. Aortic Atherosclerosis (ICD10-170.0). Post fixation of right rib fractures. Blunting of right lateral costophrenic angle as before. IMPRESSION: 1. Increasing left pleural effusion. 2. Right IJ central line to mid right atrium. No pneumothorax. Electronically Signed   By: Corlis Leak M.D.   On: 09/30/2023 15:46        Scheduled Meds:  amiodarone  200 mg Oral Daily   atorvastatin  80 mg Oral q1800   Chlorhexidine Gluconate Cloth  6 each Topical Q0600   finasteride  5 mg Oral Daily   fluticasone furoate-vilanterol  1 puff Inhalation  Daily   furosemide  80 mg Intravenous Once   iron polysaccharides  150 mg Oral Daily   levothyroxine  75 mcg Oral Q0600   lidocaine  20 mL Infiltration Once   mouth rinse  15 mL Mouth Rinse 4 times per day   polyethylene glycol  17 g Oral Daily   sevelamer carbonate  1,600 mg Oral TID WC   tamsulosin  0.4 mg Oral QPC supper   Continuous Infusions:  sodium chloride            Glade Lloyd, MD [facility] 10/02/2023,  7:19 AM

## 2023-10-02 NOTE — Consult Note (Signed)
CARDIOLOGY CONSULT NOTE  Patient ID: Timothy Mata MRN: [id number] DOB/AGE: [birth year] 89 y.o.  Admit date: 09/14/2023 Primary Physician Royann Shivers, PA-C Primary Cardiologist Dietrich Pates, MD Chief Complaint   Requesting  Dr. Merrily Pew  HPI:   He presented to [facility] with on 9/21 with abdominal pain and bladder outlet obstruction and sepsis secondary to a UTI. He had cardiac arrest requiring intubation.  This was reported to PEA arrest.   He was eventually extubated and weaned off pressors.   He developed AKI on CKD IIIB.  Dialysis has been initiated this admission.   He has had atrial fib/flutter this admission and was on amio and Eliquis.  EF has been well preserved as below.   He has had thoracentesis post arrest and he had  thoracentesis with bloody fluid.   ASA and Eliquis have been on hold for this.  Because of his multiple comorbid conditions and frailty palliative care has been consulted.  He is currently full code though goals of therapy conversations are ongoing.    Today while getting a CT of his chest he had hypotension with tachycardia.  He had worsening SOB.  He was transferred to the ICU.  He is being diuresed and treated for probable ongoing sepsis.   We are called because of the atrial fib with RVR.  He is also noted to have a possible intramural hematoma on his chest CT.    He denies any recent cardiovascular symptoms prior to this presentation.  He was not noticing any atrial fib or flutter.  The patient denies any new symptoms such as chest discomfort, neck or arm discomfort. There has been no new shortness of breath, PND or orthopnea. There have been no reported palpitations, presyncope or syncope.   He has a history of PVD and is followed with conservative management by [facility].    He has a history of HTN and the last office note suggests he was not taking his meds.   He has had atrial fib and has required DCCV and amio.  He has been on chronic anticoagulation prior to this  admission.        Past Medical History:  Diagnosis Date   Atrial flutter (HCC)    a. s/p DCCV in 03/2022 b. repeat EKG at follow-up from DCCV showing atrial fibrillation   BPH (benign prostatic hyperplasia)    COPD (chronic obstructive pulmonary disease) (HCC)    Dysrhythmia    Gout    "left big toe"   History of kidney stones    HOH (hard of hearing)    Hypercholesterolemia    Hypertension    Preretinal fibrosis, left eye    Spinal headache 12/30/1972    Past Surgical History:  Procedure Laterality Date   25 GAUGE PARS PLANA VITRECTOMY WITH 20 GAUGE MVR PORT Left 07/18/2015   Procedure: 25 GAUGE PARS PLANA VITRECTOMY WITH 20 GAUGE MVR PORT;  Surgeon: Sherrie George, MD;  Location: [facility] OR;  Service: Ophthalmology;  Laterality: Left;   AIR/FLUID EXCHANGE Left 07/18/2015   Procedure: AIR/FLUID EXCHANGE;  Surgeon: Sherrie George, MD;  Location: [facility] OR;  Service: Ophthalmology;  Laterality: Left;   BACK SURGERY     CARDIOVERSION N/A 04/26/2022   Procedure: CARDIOVERSION;  Surgeon: Jonelle Sidle, MD;  Location: [facility] ORS;  Service: Cardiovascular;  Laterality: N/A;   ENDARTERECTOMY Right 11/25/2022   Procedure: ENDARTERECTOMY CAROTID WITH PATCH ANGIOPLASTY;  Surgeon: Cephus Shelling, MD;  Location: [facility] OR;  Service: Vascular;  Laterality: Right;   EYE SURGERY     FOOT FRACTURE SURGERY Right 1981   IR FLUORO GUIDE CV LINE RIGHT  09/26/2023   IR THORACENTESIS ASP PLEURAL SPACE W/IMG GUIDE  09/25/2023   IR US GUIDE VASC ACCESS RIGHT  09/26/2023   LASER PHOTO ABLATION Left 07/18/2015   Procedure: LASER PHOTO ABLATION;  Surgeon: Sherrie George, MD;  Location: [facility] OR;  Service: Ophthalmology;  Laterality: Left;  head scope    LUMBAR DISC SURGERY  1974   MEMBRANE PEEL Left 07/18/2015   Procedure: MEMBRANE PEEL;  Surgeon: Sherrie George, MD;  Location: [facility] OR;  Service: Ophthalmology;  Laterality: Left;   MULTIPLE TOOTH EXTRACTIONS  1990's   PARS PLANA VITRECTOMY Left 07/18/2015   PATCH  ANGIOPLASTY Right 11/25/2022   Procedure: PATCH ANGIOPLASTY WITH 1 cm X 6 cm XENOSURE BIOLOGIC PATCH;  Surgeon: Cephus Shelling, MD;  Location: [facility] OR;  Service: Vascular;  Laterality: Right;    No Known Allergies Medications Prior to Admission  Medication Sig Dispense Refill Last Dose   apixaban (ELIQUIS) 2.5 MG TABS tablet TAKE 1 TABLET BY MOUTH TWICE A DAY 60 tablet 5 09/19/2023 at 2000   aspirin 81 MG chewable tablet Chew 1 tablet (81 mg total) by mouth daily.   09/19/2023 at 2000   atorvastatin (LIPITOR) 80 MG tablet Take 1 tablet (80 mg total) by mouth daily. 30 tablet 0 09/19/2023   Fluticasone-Salmeterol (ADVAIR) 250-50 MCG/DOSE AEPB Inhale 1 puff into the lungs 2 (two) times daily.   10/23/2023   furosemide (LASIX) 40 MG tablet Take 1 tablet (40 mg total) by mouth daily. 90 tablet 3 09/19/2023   levothyroxine (SYNTHROID) 75 MCG tablet Take 75 mcg by mouth daily before breakfast.   09/19/2023   metoprolol succinate (TOPROL-XL) 100 MG 24 hr tablet TAKE 1 TABLET (100 MG TOTAL) BY MOUTH IN THE MORNING. TAKE WITH OR IMMEDIATELY FOLLOWING A MEAL. 90 tablet 1 09/19/2023   nitroGLYCERIN (NITROSTAT) 0.4 MG SL tablet Place 1 tablet (0.4 mg total) under the tongue every 5 (five) minutes x 3 doses as needed for chest pain. 10 tablet 12 unknown   tamsulosin (FLOMAX) 0.4 MG CAPS capsule Take 1 capsule (0.4 mg total) by mouth daily after supper. (Patient not taking: Reported on 10/07/2023) 30 capsule 3 Not Taking   Family History  Problem Relation Age of Onset   Cancer Father     Social History   Socioeconomic History   Marital status: Married    Spouse name: Not on file   Number of children: Not on file   Years of education: Not on file   Highest education level: Not on file  Occupational History   Not on file  Tobacco Use   Smoking status: Former    Current packs/day: 1.00    Average packs/day: 1 pack/day for 32.0 years (32.0 ttl pk-yrs)    Types: Cigarettes   Smokeless tobacco: Never    Tobacco comments:    quit smoking at age 13  Vaping Use   Vaping status: Never Used  Substance and Sexual Activity   Alcohol use: No   Drug use: No   Sexual activity: Not on file  Other Topics Concern   Not on file  Social History Narrative   Not on file   Social Determinants of Health   Financial Resource Strain: Not on file  Food Insecurity: Patient Unable To Answer (09/21/2023)   Hunger Vital Sign    Worried About Running Out of  Food in the Last Year: Patient unable to answer    Ran Out of Food in the Last Year: Patient unable to answer  Transportation Needs: Patient Unable To Answer (09/21/2023)   PRAPARE - Transportation    Lack of Transportation (Medical): Patient unable to answer    Lack of Transportation (Non-Medical): Patient unable to answer  Physical Activity: Not on file  Stress: Not on file  Social Connections: Not on file  Intimate Partner Violence: Patient Unable To Answer (09/21/2023)   Humiliation, Afraid, Rape, and Kick questionnaire    Fear of Current or Ex-Partner: Patient unable to answer    Emotionally Abused: Patient unable to answer    Physically Abused: Patient unable to answer    Sexually Abused: Patient unable to answer     ROS:    As stated in the HPI and negative for all other systems.  Physical Exam: Blood pressure 101/62, pulse (!) 113, temperature (!) 97.4 F (36.3 C), temperature source Axillary, resp. rate (!) 21, height 5\' 10"  (1.778 m), weight 110 kg, SpO2 98%.  GENERAL:  Well appearing HEENT:  Pupils equal round and reactive, fundi not visualized, oral mucosa unremarkable NECK:  No jugular venous distention, waveform within normal limits, carotid upstroke brisk and symmetric, no bruits, no thyromegaly LYMPHATICS:  No cervical, inguinal adenopathy LUNGS:  Clear to auscultation bilaterally BACK:  No CVA tenderness CHEST:  Unremarkable HEART:  PMI not displaced or sustained,S1 and S2 within normal limits, no S3,  no clicks, no rubs, no  murmurs, irregular ABD:  Flat, positive bowel sounds normal in frequency in pitch, no bruits, no rebound, no guarding, no midline pulsatile mass, no hepatomegaly, no splenomegaly EXT:  2 plus pulses throughout, no edema, no cyanosis no clubbing SKIN:  No rashes no nodules NEURO:  Cranial nerves II through XII grossly intact, motor grossly intact throughout PSYCH:  Cognitively intact, oriented to person place and time   Labs: Lab Results  Component Value Date   BUN 53 (H) 10/02/2023   Lab Results  Component Value Date   CREATININE 5.35 (H) 10/02/2023   Lab Results  Component Value Date   NA 136 10/02/2023   K 4.1 10/02/2023   CL 98 10/02/2023   CO2 22 10/02/2023   No results found for: "TROPONINI" Lab Results  Component Value Date   WBC 26.7 (H) 10/02/2023   HGB 9.0 (L) 10/02/2023   HCT 28.2 (L) 10/02/2023   MCV 91.0 10/02/2023   PLT 603 (H) 10/02/2023   Lab Results  Component Value Date   CHOL 139 07/02/2023   HDL 47 07/02/2023   LDLCALC 71 07/02/2023   TRIG 107 07/02/2023   CHOLHDL 3.0 07/02/2023   Lab Results  Component Value Date   ALT 23 10/02/2023   AST 34 10/02/2023   ALKPHOS 86 10/02/2023   BILITOT 1.3 (H) 10/02/2023      Radiology:   CXR: 1. Large left pleural effusion likely with loculated components and substantial passive atelectasis. 2. Small right pleural effusion along the costophrenic angle and minor fissure. 3. Moderate enlargement of the cardiopericardial silhouette. 4. Right IJ central line tip: Cavoatrial junction. Right PICC line tip: Lower SVC.  EKG:09/21/23  Atrial fib, QT prolonged rate 95.       ASSESSMENT AND PLAN:   Atrial fib with RVR:  Unfortunately, this is responsive to his severe underlying illness.  Rate control will be difficult and the atrial fib and rapid rate are not the primary issue but rather secondary.  Management will continue to be support of his BP.  I do agree that amiodarone is reasonable for rate control today.   Will reassess in the AM.  Would like to avoid chemical cardioversion since he is off the anticoagulant.    Acute diastolic HF:   Patient is undergoing dialysis and diuretics have been administered per nephrology.    Possible intramural thrombus:    Management would be conservative.  BP control is not an issue.   PEA arrest:  This admission.   Secondary to multifactorial comorbid conditions.    SignedRollene Rotunda 10/02/2023, 4:20 PM

## 2023-10-02 NOTE — Progress Notes (Addendum)
Pharmacy Antibiotic Note  Timothy Mata is a 89 y.o. male admitted on 29-Sep-2023.  Pharmacy has been consulted to restart antibiotics for sepsis   New HD patient  Addendum  CCM change cefepime to zosyn to cover for aspiration PNA.   Plan: Vancomycin 2 gram iv x 1 dose now then 1 gram MWF after HD Dc cefepime Zosyn 3.375g IV x1 then 2.25g IV q8 Follow cultures.  Height: 5\' 10"  (177.8 cm) Weight: 111.7 kg (246 lb 4.1 oz) IBW/kg (Calculated) : 73  Temp (24hrs), Avg:97.7 F (36.5 C), Min:97.4 F (36.3 C), Max:97.9 F (36.6 C)  Recent Labs  Lab 09/28/23 0319 09/29/23 0413 09/30/23 0216 10/01/23 0339 10/02/23 0243  WBC 16.7* 17.5* 20.6* 22.3* 22.4*  CREATININE 7.81* 8.20* 6.37* 6.99* 4.69*    Estimated Creatinine Clearance: 13.9 mL/min (A) (by C-G formula based on SCr of 4.69 mg/dL (H)).    No Known Allergies   Ulyses Southward, PharmD, BCIDP, AAHIVP, CPP Infectious Disease Pharmacist 10/02/2023 1:08 PM

## 2023-10-02 NOTE — Progress Notes (Signed)
Patient is in AKI, on HD, CT chest showed possible aortic mural thrombus, need CT angiogram chest to r/u aortic dissection, though his creatine is elevated, benefit to r/o dissection outweigh the risk of renal failure. Spoke with Dr. Vallery Sa from Nephrology, she is in agreement to do CTA chest.       Cheri Fowler, MD [facility] See Amion for pager If no response to pager, please call [phone] until 7pm After 7pm, Please call E-link [phone]

## 2023-10-02 NOTE — Progress Notes (Addendum)
I was notified by the nurse that the patient had gone down for CT but was sent back without completion of CT because of A-fib with RVR and hypotension with blood pressure of 76/50.  Repeat blood pressures have been in the 70s and 80s.  I communicated with Dr. Vallery Sa regarding the same.  I have ordered 500 cc IV fluid bolus.  Patient seen and examined at bedside: Rapid response team and primary RN in the room.  Systolic blood pressure still ranging in the 70s and 80s with heart rate in the mid 100s and 110s.  Patient is able to answer questions and denies any abdominal pain, chest pain or worsening shortness of breath.  He still has pitting edema in bilateral lower extremities.  Abdomen is nontender.  Lung exam reveals decreased breath sounds at bases with some scattered crackles and tachypnea. Plan -Stat labs including sepsis labs.  Normal saline IV fluid bolus 500 cc.  I have ordered midodrine.  Stat portable chest x-ray.  Start empiric cefepime and vancomycin.  Blood cultures were sent yesterday which are negative.  UA and urine culture. -I have consulted cardiology for A-fib with RVR.  I have also consulted PCCM.  Follow recommendations. -I have communicated this with rapid response team and primary RN along with nephrology/Dr. Malen Gauze.

## 2023-10-02 NOTE — Progress Notes (Signed)
Patient ID: Wilkins Mance, male   DOB: June 24, 1933, 89 y.o.   MRN: [id number]   Subjective:  He had 300 mL uop over 10/2 charted.  Last HD on 10/2 with 1.5 kg UF.  Per nursing report 10/2 his foley is being irrigated frequently due to clotting.  He is hopeful that kidneys will get better.    Review of systems:      Denies shortness of breath or chest pain  Denies n/v     O:BP 124/79 (BP Location: Left Arm)   Pulse (!) 103   Temp 97.7 F (36.5 C) (Axillary)   Resp 19   Ht 5\' 10"  (1.778 m)   Wt 111.7 kg   SpO2 100%   BMI 35.33 kg/m   Intake/Output Summary (Last 24 hours) at 10/02/2023 0921 Last data filed at 10/01/2023 2353 Gross per 24 hour  Intake 40 ml  Output 1800 ml  Net -1760 ml   Intake/Output: I/O last 3 completed shifts: In: 120 [Other:120] Out: 2500 [Urine:1000; Other:1500]  Intake/Output this shift:  No intake/output data recorded. Weight change: -2.4 kg  General elderly male in bed in no acute distress.  HEENT normocephalic atraumatic extraocular movements intact sclera anicteric; multiple lesions of his lips   Neck supple trachea midline Lungs crackles on auscultation bilaterally normal work of breathing at rest on 2 liters oxygen Heart S1S2 no rub Abdomen soft nontender mildly distended Extremities 1-2+ edema lower extremities Psych normal mood and affect Neuro - awake and alert; provides basic hx and follows commands  GU - foley catheter in place with dark urine     Recent Labs  Lab 09/26/23 0418 09/27/23 0838 09/28/23 0319 09/29/23 0413 09/30/23 0216 10/01/23 0339 10/02/23 0243  NA 135 137 137 137 135 136 135  K 4.3 4.1 3.8 3.8 3.9 4.0 3.8  CL 105 101 101 103 97* 100 97*  CO2 16* 17* 21* 19* 22 22 26   GLUCOSE 94 90 104* 103* 103* 91 95  BUN 95* 81* 86* 97* 68* 79* 48*  CREATININE 8.50* 7.24* 7.81* 8.20* 6.37* 6.99* 4.69*  ALBUMIN 1.6* 1.6* 2.1* 2.0* 2.0* 2.0* 2.0*  CALCIUM 7.8* 7.8* 7.9* 8.0* 7.8* 8.0* 7.8*  PHOS 9.7* 8.4* 8.9* 9.1* 7.5*  8.9* 5.7*   Liver Function Tests: Recent Labs  Lab 09/30/23 0216 10/01/23 0339 10/02/23 0243  ALBUMIN 2.0* 2.0* 2.0*    No results for input(s): "AMMONIA" in the last 168 hours. CBC: Recent Labs  Lab 09/28/23 0319 09/29/23 0413 09/30/23 0216 10/01/23 0339 10/02/23 0243  WBC 16.7* 17.5* 20.6* 22.3* 22.4*  NEUTROABS 12.9* 13.6* 18.5* 17.8* 18.2*  HGB 8.3* 8.8* 9.0* 9.3* 9.4*  HCT 26.0* 27.8* 28.0* 28.9* 29.1*  MCV 90.6 92.4 89.2 91.7 91.5  PLT 488* 534* 577* 617* 572*   Cardiac Enzymes: No results for input(s): "CKTOTAL", "CKMB", "CKMBINDEX", "TROPONINI" in the last 168 hours. CBG: Recent Labs  Lab 09/30/23 0433 09/30/23 0724 09/30/23 1140 09/30/23 1602 09/30/23 2110  GLUCAP 97 97 109* 116* 132*    Studies/Results: DG CHEST PORT 1 VIEW  Result Date: 09/30/2023 CLINICAL DATA:  Lymphocytosis, hemothorax EXAM: PORTABLE CHEST - 1 VIEW COMPARISON:  09/25/2023 FINDINGS: Right IJ central line placement to the mid right atrium. No pneumothorax. Stable right arm PICC. Increasing left pleural effusion surrounding and compressing the left lung particularly at the base. Heart size upper limits normal. Aortic Atherosclerosis (ICD10-170.0). Post fixation of right rib fractures. Blunting of right lateral costophrenic angle as before. IMPRESSION: 1. Increasing left pleural  effusion. 2. Right IJ central line to mid right atrium. No pneumothorax. Electronically Signed   By: Corlis Leak M.D.   On: 09/30/2023 15:46    amiodarone  200 mg Oral Daily   atorvastatin  80 mg Oral q1800   Chlorhexidine Gluconate Cloth  6 each Topical Q0600   finasteride  5 mg Oral Daily   fluticasone furoate-vilanterol  1 puff Inhalation Daily   furosemide  80 mg Intravenous Once   iron polysaccharides  150 mg Oral Daily   levothyroxine  75 mcg Oral Q0600   lidocaine  20 mL Infiltration Once   mouth rinse  15 mL Mouth Rinse 4 times per day   polyethylene glycol  17 g Oral Daily   sevelamer carbonate  1,600  mg Oral TID WC   tamsulosin  0.4 mg Oral QPC supper    BMET    Component Value Date/Time   NA 135 10/02/2023 0243   K 3.8 10/02/2023 0243   CL 97 (L) 10/02/2023 0243   CO2 26 10/02/2023 0243   GLUCOSE 95 10/02/2023 0243   BUN 48 (H) 10/02/2023 0243   CREATININE 4.69 (H) 10/02/2023 0243   CALCIUM 7.8 (L) 10/02/2023 0243   GFRNONAA 11 (L) 10/02/2023 0243   GFRAA 47 (L) 07/18/2015 0939   CBC    Component Value Date/Time   WBC 22.4 (H) 10/02/2023 0243   RBC 3.18 (L) 10/02/2023 0243   HGB 9.4 (L) 10/02/2023 0243   HCT 29.1 (L) 10/02/2023 0243   PLT 572 (H) 10/02/2023 0243   MCV 91.5 10/02/2023 0243   MCH 29.6 10/02/2023 0243   MCHC 32.3 10/02/2023 0243   RDW 16.0 (H) 10/02/2023 0243   LYMPHSABS 1.0 10/02/2023 0243   MONOABS 1.7 (H) 10/02/2023 0243   EOSABS 0.3 10/02/2023 0243   BASOSABS 0.1 10/02/2023 0243    Assessment/Plan:  AKI/CKD Stage IIIb, oliguric - presumably ischemic ATN in setting of PEA arrest.  Blood pressures have improved.  Persistently worsening kidney function with low urine output.  Temp cath placed by IR on 9/28 (given concern for infection). Had first HD on 9/28. Possibly had some obstructive component and mild left hydro so foley placed 9/28. Urine very dark possibly some blood/ATN.   HD per MWF schedule as needed for now. Assess dialysis needs daily Will give Lasix 80 mg IV today.  Assess diuretic needs daily Leukocytosis.  Note that right now with continued leukocytosis.  If he continues to need HD will need a tunneled dialysis catheter  Continue strict ins/outs  PEA arrest and septic shock - shock resolved. Abx per primary team.  9/22 blood cx negative  Acute respiratory failure post arrest with underlying COPD - weaned to room air.   Leukocytosis - repeat blood cultures sent 10/2.  Work-up per primary team. Urinary source?  Abx per primary team.   Afib/flutter - on amiodarone per primary team  CKD stage 3b - baseline Cr 1.9 - 2.4 Normocytic anemia -  likely combination of AKI/CKD and anemia of critical illness.  Transfuse for Hgb <7. Started aranesp 40 mcg weekly on Wednesdays on 10/2.  Iron deficiency.  Gave venofer x 1 dose on 10/1 and oral iron HFrEF - EF 60% and RV function low normal.  HD as above to optimize as needed Hyperphosphatemia: have increased sevelamer dose.  HD as above.  Improved.   Disposition - continue inpatient monitoring.  He has just been initiated on HD for AKI and we are monitoring for the length  of need  Estanislado Emms, MD 9:43 AM 10/02/2023

## 2023-10-03 ENCOUNTER — Inpatient Hospital Stay (HOSPITAL_COMMUNITY): Payer: Medicare HMO

## 2023-10-03 ENCOUNTER — Telehealth (HOSPITAL_BASED_OUTPATIENT_CLINIC_OR_DEPARTMENT_OTHER): Payer: Self-pay | Admitting: *Deleted

## 2023-10-03 DIAGNOSIS — R579 Shock, unspecified: Secondary | ICD-10-CM

## 2023-10-03 DIAGNOSIS — Z7189 Other specified counseling: Secondary | ICD-10-CM | POA: Diagnosis not present

## 2023-10-03 DIAGNOSIS — I080 Rheumatic disorders of both mitral and aortic valves: Secondary | ICD-10-CM

## 2023-10-03 DIAGNOSIS — I3139 Other pericardial effusion (noninflammatory): Secondary | ICD-10-CM | POA: Diagnosis not present

## 2023-10-03 DIAGNOSIS — Z515 Encounter for palliative care: Secondary | ICD-10-CM | POA: Diagnosis not present

## 2023-10-03 DIAGNOSIS — N170 Acute kidney failure with tubular necrosis: Secondary | ICD-10-CM | POA: Diagnosis not present

## 2023-10-03 DIAGNOSIS — A419 Sepsis, unspecified organism: Secondary | ICD-10-CM | POA: Diagnosis not present

## 2023-10-03 DIAGNOSIS — I469 Cardiac arrest, cause unspecified: Secondary | ICD-10-CM | POA: Diagnosis not present

## 2023-10-03 DIAGNOSIS — R6521 Severe sepsis with septic shock: Secondary | ICD-10-CM | POA: Diagnosis not present

## 2023-10-03 LAB — RENAL FUNCTION PANEL
Albumin: 1.8 g/dL — ABNORMAL LOW (ref 3.5–5.0)
Anion gap: 19 — ABNORMAL HIGH (ref 5–15)
BUN: 61 mg/dL — ABNORMAL HIGH (ref 8–23)
CO2: 23 mmol/L (ref 22–32)
Calcium: 7.8 mg/dL — ABNORMAL LOW (ref 8.9–10.3)
Chloride: 94 mmol/L — ABNORMAL LOW (ref 98–111)
Creatinine, Ser: 5.51 mg/dL — ABNORMAL HIGH (ref 0.61–1.24)
GFR, Estimated: 9 mL/min — ABNORMAL LOW (ref >60)
Glucose, Bld: 126 mg/dL — ABNORMAL HIGH (ref 70–99)
Phosphorus: 7.8 mg/dL — ABNORMAL HIGH (ref 2.5–4.6)
Potassium: 4.5 mmol/L (ref 3.5–5.1)
Sodium: 136 mmol/L (ref 135–145)

## 2023-10-03 LAB — PROCALCITONIN: Procalcitonin: 4.47 ng/mL

## 2023-10-03 LAB — CBC WITH DIFFERENTIAL/PLATELET
Abs Immature Granulocytes: 1.48 10*3/uL — ABNORMAL HIGH (ref 0.00–0.07)
Basophils Absolute: 0.1 10*3/uL (ref 0.0–0.1)
Basophils Relative: 0 %
Eosinophils Absolute: 0 10*3/uL (ref 0.0–0.5)
Eosinophils Relative: 0 %
HCT: 27.4 % — ABNORMAL LOW (ref 39.0–52.0)
Hemoglobin: 8.7 g/dL — ABNORMAL LOW (ref 13.0–17.0)
Immature Granulocytes: 5 %
Lymphocytes Relative: 3 %
Lymphs Abs: 1 10*3/uL (ref 0.7–4.0)
MCH: 28.8 pg (ref 26.0–34.0)
MCHC: 31.8 g/dL (ref 30.0–36.0)
MCV: 90.7 fL (ref 80.0–100.0)
Monocytes Absolute: 2.3 10*3/uL — ABNORMAL HIGH (ref 0.1–1.0)
Monocytes Relative: 7 %
Neutro Abs: 26.8 10*3/uL — ABNORMAL HIGH (ref 1.7–7.7)
Neutrophils Relative %: 85 %
Platelets: 525 10*3/uL — ABNORMAL HIGH (ref 150–400)
RBC: 3.02 MIL/uL — ABNORMAL LOW (ref 4.22–5.81)
RDW: 16.2 % — ABNORMAL HIGH (ref 11.5–15.5)
WBC: 31.7 10*3/uL — ABNORMAL HIGH (ref 4.0–10.5)
nRBC: 0 % (ref 0.0–0.2)

## 2023-10-03 LAB — ECHOCARDIOGRAM LIMITED
Height: 70 in
Weight: 3911.84 [oz_av]

## 2023-10-03 LAB — GLUCOSE, CAPILLARY: Glucose-Capillary: 105 mg/dL — ABNORMAL HIGH (ref 70–99)

## 2023-10-03 LAB — TROPONIN I (HIGH SENSITIVITY): Troponin I (High Sensitivity): 52 ng/L — ABNORMAL HIGH (ref <18)

## 2023-10-03 LAB — MAGNESIUM: Magnesium: 2.2 mg/dL (ref 1.7–2.4)

## 2023-10-03 MED ORDER — HEPARIN SODIUM (PORCINE) 1000 UNIT/ML IJ SOLN
INTRAMUSCULAR | Status: AC
  Filled 2023-10-03: qty 3

## 2023-10-03 MED ORDER — ALTEPLASE 2 MG IJ SOLR: 2.0000 mg | Freq: Once | INTRAMUSCULAR | Status: DC | PRN

## 2023-10-03 MED ORDER — GLYCOPYRROLATE 0.2 MG/ML IJ SOLN
0.4000 mg | Freq: Four times a day (QID) | INTRAMUSCULAR | Status: DC
  Administered 2023-10-03 – 2023-10-04 (×3): 0.4 mg via INTRAVENOUS
  Filled 2023-10-03 (×4): qty 2

## 2023-10-03 MED ORDER — HEPARIN SODIUM (PORCINE) 1000 UNIT/ML DIALYSIS: 1000.0000 [IU] | INTRAMUSCULAR | Status: DC | PRN

## 2023-10-03 MED ORDER — CHLORHEXIDINE GLUCONATE CLOTH 2 % EX PADS
6.0000 | MEDICATED_PAD | Freq: Every day | CUTANEOUS | Status: DC
  Administered 2023-10-03 – 2023-10-04 (×2): 6 via TOPICAL

## 2023-10-03 MED ORDER — ANTICOAGULANT SODIUM CITRATE 4% (200MG/5ML) IV SOLN: 5.0000 mL | Status: DC | PRN

## 2023-10-03 NOTE — TOC Progression Note (Addendum)
Transition of Care [facility]) - Progression Note    Patient Details  Name: Timothy Mata MRN: [id number] Date of Birth: [birth year]  [facility] CM/SW Contact  Frederick-Johnson, Hershal Coria, RN Phone Number: 10/03/2023, 1:49 PM  Clinical Narrative:     CM spoke with patient at bedside about home health recommendation. Patient states he was active with [facility] and would like to continue with their services.  CM called in referral to [facility] with acceptance voiced, info on AVS.  Patient currently on 4L O2. Continues on IV abx, IV Amiodarone and Levo.  Palliative GOC meeting with family today.  Patient not Medically ready for discharge.  CM will continue to follow as patient progresses with care towards discharge.        Expected Discharge Plan: Home w Home Health Services Barriers to Discharge: Continued Medical Work up  Expected Discharge Plan and Services   Discharge Planning Services: CM Consult Post Acute Care Choice: Home Health Living arrangements for the past 2 months: Single Family Home                 DME Arranged: N/A DME Agency: NA       HH Arranged: PT, OT, RN, Disease Management HH Agency: [facility] Date HH Agency Contacted: 10/03/23 Time HH Agency Contacted: 1330 Representative spoke with at [facility] Agency: Elnita Maxwell   Social Determinants of Health (SDOH) Interventions SDOH Screenings   Food Insecurity: Patient Unable To Answer (09/21/2023)  Housing: Patient Unable To Answer (09/21/2023)  Transportation Needs: Patient Unable To Answer (09/21/2023)  Utilities: Patient Unable To Answer (09/21/2023)  Tobacco Use: Medium Risk (09/21/2023)    Readmission Risk Interventions     No data to display

## 2023-10-03 NOTE — Progress Notes (Addendum)
Palliative:  HPI: 89 y.o. male   admitted on 12-Oct-2023  with PMH a-fib on Eliquis, COPD, HFrEF, HTN, HLD who initially presented to [facility] on 10/12/23. Has had increased urinary frequency. CXR with mild pulm edema. CT ABD/pelvis no acute abnormality; mild left hydroureteronephrosis; small bilateral pleural effusions. WBC elevated. UA with nitrite and large leukocytes. Cultures obtained and given IV fluids. Patient started on Rocephin. Due to worsening hypotension, he was transferred to the ICU.  He suffered PEA arrest with ROSC after 10 minutes.  He was then transferred to [facility]. He was able to be extubated on 09/22/2023. He developed decreased UOP and ATN after his cardiac event.  Dialysis has been initiated. Today is day 11 of this hospitalization.  Slow progress regarding physical therapy goals.  He remains weak and deconditioned, concern for long-term poor prognosis. Patient and family face treatment option decisions, advanced directive decisions and anticipatory care needs.   I met today with Timothy Mata along with wife and niece, Timothy Mata and Timothy Mata. Many findings and further conversations since my visit yesterday. We discussed heart findings in the context of his already complicated health situation as well as dialysis. After discussing they understand overall poor prognosis and Timothy Mata would like to go home. We discussed hospice support at home and they all agree this with hospice care. DNR is confirmed. No further dialysis desired. Goal is to coordinate with hospice to get home - hopefully tomorrow if possible. We discussed care at home and use of medications for comfort. They both have experience with being caregivers for family at end of life and with hospice. They would like to remain home but may need support from hospice facility at some point in the future depending on his progression and care needs. Discussed hospice and they would like to utilize help from [facility].    All questions/concerns addressed. Emotional support provided. Discussed with PCCM, RN, CMRN, Renal.   Exam: Alert, oriented. Sleepy today. No distress. Breathing regular, unlabored. Congested cough. Abd soft. Warm to touch.   Plan: - DNR decided - Home with hospice - Scheduled Robinul to help with congestion - Recommend for home:  - Continue foley at home  - Roxanol 5 mg SL q4h PRN pain/sob  - Hyoscamine 0.125 mg SL q4h PRN secretions  65 min  Timothy Channel, NP Palliative Medicine Team Pager [phone] (Please see [email or web address] for schedule) Team Phone [phone]    Greater than 50%  of this time was spent counseling and coordinating care related to the above assessment and plan

## 2023-10-03 NOTE — Progress Notes (Signed)
*  PRELIMINARY RESULTS* Echocardiogram 2D Echocardiogram has been performed.  Timothy Mata 10/03/2023, 8:47 AM

## 2023-10-03 NOTE — Progress Notes (Signed)
Patient ID: Timothy Mata, male   DOB: May 06, 1933, 89 y.o.   MRN: [id number]   Subjective:  He was moved to the ICU for hypotension yesterday.  Spoke with transferring team and with pulm at that time.  Noted that a bedside echo with cardiology demonstrated probable moderate effusion with echodense in pericardial space - likely coagulum c/w hemorrhagic pericardial effusion - circumferential.  CT c/a/p also with large multi-loculated pleural effusion.  Then CT angio c/a/p had findings consistent with a penetrating ulcer in the ascending aorta anteriorly with vascular surgery consultation recommended.  He had 75 mL uop over 10/3.  Last HD on 10/2 with 1.5 kg UF.  Per nursing report 10/2 his foley was being irrigated frequently due to clotting.     Review of systems:    Denies shortness of breath - "breathing about the same" or chest pain  Denies n/v     O:BP 114/77   Pulse 89   Temp (!) 97.5 F (36.4 C) (Axillary)   Resp 15   Ht 5\' 10"  (1.778 m)   Wt 110.9 kg   SpO2 100%   BMI 35.08 kg/m   Intake/Output Summary (Last 24 hours) at 10/03/2023 0739 Last data filed at 10/03/2023 4098 Gross per 24 hour  Intake 2507.22 ml  Output 75 ml  Net 2432.22 ml   Intake/Output: I/O last 3 completed shifts: In: 2507.2 [I.V.:981; IV Piggyback:1526.2] Out: 1575 [Urine:75; Other:1500]  Intake/Output this shift:  No intake/output data recorded. Weight change: -1.7 kg  General elderly male in bed in no acute distress.   HEENT normocephalic atraumatic extraocular movements intact sclera anicteric; multiple lesions of his lips   Neck supple trachea midline Lungs crackles on auscultation bilaterally normal work of breathing at rest; transmitted upper airway sounds Heart S1S2 rub vs. Transmitted upper airway sounds?  Abdomen soft nontender mildly distended Extremities 2+ edema lower extremities Psych normal mood and affect Neuro - awake and alert; provides basic hx and follows commands  GU - foley  catheter in place  Access RIJ nontunneled catheter     Recent Labs  Lab 09/27/23 0838 09/28/23 0319 09/29/23 0413 09/30/23 0216 10/01/23 0339 10/02/23 0243 10/02/23 1345 10/03/23 0137  NA 137 137 137 135 136 135 136 136  K 4.1 3.8 3.8 3.9 4.0 3.8 4.1 4.5  CL 101 101 103 97* 100 97* 98 94*  CO2 17* 21* 19* 22 22 26 22 23   GLUCOSE 90 104* 103* 103* 91 95 135* 126*  BUN 81* 86* 97* 68* 79* 48* 53* 61*  CREATININE 7.24* 7.81* 8.20* 6.37* 6.99* 4.69* 5.35* 5.51*  ALBUMIN 1.6* 2.1* 2.0* 2.0* 2.0* 2.0* 1.9* 1.8*  CALCIUM 7.8* 7.9* 8.0* 7.8* 8.0* 7.8* 7.7* 7.8*  PHOS 8.4* 8.9* 9.1* 7.5* 8.9* 5.7*  --  7.8*  AST  --   --   --   --   --   --  34  --   ALT  --   --   --   --   --   --  23  --    Liver Function Tests: Recent Labs  Lab 10/02/23 0243 10/02/23 1345 10/03/23 0137  AST  --  34  --   ALT  --  23  --   ALKPHOS  --  86  --   BILITOT  --  1.3*  --   PROT  --  6.1*  --   ALBUMIN 2.0* 1.9* 1.8*    No results for input(s): "AMMONIA" in  the last 168 hours. CBC: Recent Labs  Lab 09/30/23 0216 10/01/23 0339 10/02/23 0243 10/02/23 1345 10/03/23 0137  WBC 20.6* 22.3* 22.4* 26.7* 31.7*  NEUTROABS 18.5* 17.8* 18.2* 22.4* 26.8*  HGB 9.0* 9.3* 9.4* 9.0* 8.7*  HCT 28.0* 28.9* 29.1* 28.2* 27.4*  MCV 89.2 91.7 91.5 91.0 90.7  PLT 577* 617* 572* 603* 525*   Cardiac Enzymes: No results for input(s): "CKTOTAL", "CKMB", "CKMBINDEX", "TROPONINI" in the last 168 hours. CBG: Recent Labs  Lab 09/30/23 1602 09/30/23 2110 10/02/23 1105 10/02/23 1229 10/02/23 2204  GLUCAP 116* 132* 129* 118* 126*    Studies/Results: CT Angio Chest/Abd/Pel for Dissection W and/or W/WO  Result Date: 10/02/2023 CLINICAL DATA:  Chest pain and thickening of the ascending aorta on prior noncontrast study., subsequent encounter EXAM: CT ANGIOGRAPHY CHEST, ABDOMEN AND PELVIS TECHNIQUE: Non-contrast CT of the chest was initially obtained. Multidetector CT imaging through the chest, abdomen and  pelvis was performed using the standard protocol during bolus administration of intravenous contrast. Multiplanar reconstructed images and MIPs were obtained and reviewed to evaluate the vascular anatomy. RADIATION DOSE REDUCTION: This exam was performed according to the departmental dose-optimization program which includes automated exposure control, adjustment of the mA and/or kV according to patient size and/or use of iterative reconstruction technique. CONTRAST:  80mL OMNIPAQUE IOHEXOL 350 MG/ML SOLN COMPARISON:  Noncontrast study from the previous day. FINDINGS: CTA CHEST FINDINGS Cardiovascular: Precontrast images demonstrate heavy calcification of the thoracic aorta. No hyperdense crescent to suggest acute aortic injury is noted. Postcontrast images show a focal area of contrast outpouching along the anterior aspect of the ascending aorta. This has the appearance of a penetrating ulcer. No extravasation of contrast is seen. The thoracic aortic arch and descending aorta are within normal limits. Coronary calcifications are noted. Large pericardial effusion is noted which appears mildly hyperdense compared to the cardiac blood pool on the noncontrast images. Given the findings in the ascending aorta, the possibility of prior hemorrhage into the pericardial sac would deserve consideration. Pulmonary artery shows a normal branching pattern without intraluminal filling defect to suggest pulmonary embolism. Right jugular dialysis catheter is noted as is right-sided PICC. Mediastinum/Nodes: Thoracic inlet is within normal limits. No hilar or mediastinal adenopathy is seen. Some small likely reactive lymph nodes are noted in the mediastinum. The esophagus as visualized is within normal limits. Lungs/Pleura: Large left-sided pleural effusion is noted with underlying lower lobe compressive atelectasis. The remainder of the left lung is within normal limits. Right lung is predominately well aerated. Small effusion is  seen inferiorly with mild compressive atelectasis. Emphysematous changes are seen. Musculoskeletal: Prior rib fractures with surgical fixation are noted on the right. No compression deformities are seen. Review of the MIP images confirms the above findings. CTA ABDOMEN AND PELVIS FINDINGS VASCULAR Aorta: Abdominal aorta demonstrates no aneurysmal dilatation. Heavy atherosclerotic calcifications are seen with mild mural thrombus. No dissection is noted. Celiac: Proximal narrowing of the celiac axis is noted with mild poststenotic dilatation. SMA: Calcific changes are noted at the origin of the SMA although the vessel is patent. Renals: Single renal arteries are identified bilaterally but somewhat diminutive consistent with the known end-stage renal disease. IMA: Patent without evidence of aneurysm, dissection, vasculitis or significant stenosis. Inflow: Iliacs demonstrate heavy atherosclerotic calcifications. Focal high-grade stenosis at the origin of the left common iliac artery is noted. No other focal stenosis is seen. Veins: No specific venous abnormality is noted. Review of the MIP images confirms the above findings. NON-VASCULAR Hepatobiliary: No focal liver  abnormality is seen. No gallstones, gallbladder wall thickening, or biliary dilatation. Pancreas: Unremarkable. No pancreatic ductal dilatation or surrounding inflammatory changes. Spleen: Normal in size without focal abnormality. Adrenals/Urinary Tract: Adrenal glands are within normal limits. Kidneys are somewhat thinned consistent with the known end-stage renal disease. No renal calculi or obstructive changes are noted. The bladder is decompressed by Foley catheter. Stomach/Bowel: No obstructive or inflammatory changes of the colon are noted. The appendix is within normal limits. Small bowel and stomach unremarkable. Lymphatic: No lymphadenopathy is noted. Reproductive: Prostate is unremarkable. Other: Mild stranding is noted along the anterior aspect of  the distal abdominal aorta extending into the pelvis similar to that seen on the prior exam but of uncertain significance. Musculoskeletal: No acute or significant osseous findings. Review of the MIP images confirms the above findings. IMPRESSION: CTA of the chest: No evidence of pulmonary emboli. Findings consistent with a penetrating ulcer in the ascending aorta anteriorly. This has a somewhat lobulated appearance. So she aided mildly hyperdense pericardial effusion is noted. The possibility of prior hemorrhage into the pericardial space would deserve consideration. Vascular surgery consultation is recommended. Bilateral pleural effusions left considerably greater than right with associated compressive atelectasis. CTA of the abdomen and pelvis: Narrowing of the celiac axis proximally with mild poststenotic dilatation. High-grade stenosis of the left common iliac artery just beyond its origin. No other focal abnormality is noted. These results will be called to the ordering clinician or representative by the Radiologist Assistant, and communication documented in the PACS or [employer]. Electronically Signed   By: Alcide Clever M.D.   On: 10/02/2023 20:10   CT CHEST ABDOMEN PELVIS WO CONTRAST  Result Date: 10/02/2023 CLINICAL DATA:  Intentional weight loss, abdominal pain. EXAM: CT CHEST, ABDOMEN AND PELVIS WITHOUT CONTRAST TECHNIQUE: Multidetector CT imaging of the chest, abdomen and pelvis was performed following the standard protocol without IV contrast. RADIATION DOSE REDUCTION: This exam was performed according to the departmental dose-optimization program which includes automated exposure control, adjustment of the mA and/or kV according to patient size and/or use of iterative reconstruction technique. COMPARISON:  10/19/23. FINDINGS: CT CHEST FINDINGS Cardiovascular: Atherosclerosis of thoracic aorta is noted without aneurysm formation. Mild cardiomegaly is noted. Moderate pericardial  effusion is noted. Mild wall thickening of ascending thoracic aorta is noted; intramural hematoma cannot be excluded. Coronary artery calcifications are noted. Mediastinum/Nodes: No enlarged mediastinal, hilar, or axillary lymph nodes. Thyroid gland, trachea, and esophagus demonstrate no significant findings. Lungs/Pleura: No pneumothorax is noted. Large multiloculated left pleural effusion is noted. Smaller loculated right pleural effusion is noted in right lung base extending into major fissure. Musculoskeletal: Status post surgical internal fixation of multiple right ribs. No acute osseous abnormality is noted. CT ABDOMEN PELVIS FINDINGS Hepatobiliary: No focal liver abnormality is seen. No gallstones, gallbladder wall thickening, or biliary dilatation. Pancreas: Unremarkable. No pancreatic ductal dilatation or surrounding inflammatory changes. Spleen: Normal in size without focal abnormality. Adrenals/Urinary Tract: Adrenal glands and kidneys appear normal. Urinary bladder is decompressed secondary to Foley catheter. Moderate wall thickening is noted which may be due to lack of distension, but cystitis cannot be excluded. Stomach/Bowel: Stomach is within normal limits. Appendix appears normal. No evidence of bowel wall thickening, distention, or inflammatory changes. Vascular/Lymphatic: Aortic atherosclerosis. No enlarged abdominal or pelvic lymph nodes. Reproductive: Mild prostatic enlargement. Other: Small fat containing right inguinal hernia. Minimal fluid or inflammatory stranding is noted posteriorly in the pelvis of uncertain etiology. Musculoskeletal: No acute or significant osseous findings. IMPRESSION: Mild  wall thickening of ascending thoracic aorta is noted. This is concerning for possible intramural hematoma, and CT angiography of the chest is recommended for further evaluation, if the patient's renal status will allow the administration of intravenous contrast. These results will be called to the  ordering clinician or representative by the Radiologist Assistant, and communication documented in the PACS or [employer]. Moderate pericardial effusion is noted. Large multiloculated left pleural effusion is noted. Smaller loculated pleural effusion is noted in right lung base. Urinary bladder is decompressed secondary to Foley catheter. Moderate wall thickening is noted which may be due to lack of distension, but cystitis cannot be excluded. Minimal fluid or inflammatory stranding is noted posteriorly in the pelvis of uncertain etiology. Mild prostatic enlargement. Coronary artery calcifications are noted suggesting coronary artery disease. Aortic Atherosclerosis (ICD10-I70.0). Electronically Signed   By: Lupita Raider M.D.   On: 10/02/2023 14:36   DG Chest Port 1 View  Result Date: 10/02/2023 CLINICAL DATA:  Sepsis EXAM: PORTABLE CHEST 1 VIEW COMPARISON:  09/30/2023 FINDINGS: Large left pleural effusion likely with loculated components. Small right pleural effusion along the costophrenic angle and minor fissure. Right IJ central line tip: Cavoatrial junction. Right PICC line tip: Lower SVC. Atheromatous vascular calcification of the thoracic aorta. Moderate enlargement of the cardiopericardial silhouette. Substantial passive atelectasis vertically on the left side due to the prominent multiloculated effusion. Right rib plates noted. IMPRESSION: 1. Large left pleural effusion likely with loculated components and substantial passive atelectasis. 2. Small right pleural effusion along the costophrenic angle and minor fissure. 3. Moderate enlargement of the cardiopericardial silhouette. 4. Right IJ central line tip: Cavoatrial junction. Right PICC line tip: Lower SVC. Electronically Signed   By: Gaylyn Rong M.D.   On: 10/02/2023 13:34    atorvastatin  80 mg Oral q1800   Chlorhexidine Gluconate Cloth  6 each Topical Daily   [START ON 10/08/2023] darbepoetin (ARANESP) injection - NON-DIALYSIS  40  mcg Subcutaneous Q Wed-1800   finasteride  5 mg Oral Daily   fluticasone furoate-vilanterol  1 puff Inhalation Daily   heparin sodium (porcine)       iron polysaccharides  150 mg Oral Daily   levothyroxine  75 mcg Oral Q0600   lidocaine  20 mL Infiltration Once   midodrine  10 mg Oral TID WC   mouth rinse  15 mL Mouth Rinse 4 times per day   polyethylene glycol  17 g Oral Daily   sevelamer carbonate  1,600 mg Oral TID WC   tamsulosin  0.4 mg Oral QPC supper    BMET    Component Value Date/Time   NA 136 10/03/2023 0137   K 4.5 10/03/2023 0137   CL 94 (L) 10/03/2023 0137   CO2 23 10/03/2023 0137   GLUCOSE 126 (H) 10/03/2023 0137   BUN 61 (H) 10/03/2023 0137   CREATININE 5.51 (H) 10/03/2023 0137   CALCIUM 7.8 (L) 10/03/2023 0137   GFRNONAA 9 (L) 10/03/2023 0137   GFRAA 47 (L) 07/18/2015 0939   CBC    Component Value Date/Time   WBC 31.7 (H) 10/03/2023 0137   RBC 3.02 (L) 10/03/2023 0137   HGB 8.7 (L) 10/03/2023 0137   HCT 27.4 (L) 10/03/2023 0137   PLT 525 (H) 10/03/2023 0137   MCV 90.7 10/03/2023 0137   MCH 28.8 10/03/2023 0137   MCHC 31.8 10/03/2023 0137   RDW 16.2 (H) 10/03/2023 0137   LYMPHSABS 1.0 10/03/2023 0137   MONOABS 2.3 (H) 10/03/2023 0137  EOSABS 0.0 10/03/2023 0137   BASOSABS 0.1 10/03/2023 8101    Assessment/Plan:  AKI/CKD Stage IIIb, oliguric - presumably ischemic ATN in setting of PEA arrest.  Blood pressures have improved.  Persistently worsening kidney function with low urine output.  Temp cath placed by IR on 9/28 (given concern for infection). Had first HD on 9/28. Possibly had some obstructive component and mild left hydro so foley placed 9/28. Urine very dark possibly some blood/ATN.   Discussed with primary team and we are going to reassess potential HD today after the family meeting today - team is reaching out to me.  Appreciate critical care PEA arrest and septic shock - shock resolved. Abx per primary team.  9/22 blood cx negative   Hemorrhagic pericardial effusion - per cardiology and primary team; we are postponing dialysis today until after his family meeting   Penetrating ulcer in the ascending aorta - primary team reports there is a meeting later today  Acute respiratory failure post arrest with underlying COPD Leukocytosis - repeat blood cultures sent 10/2 NGTD.  Work-up per primary team.  Abx per primary team.   Afib/flutter - on amiodarone per primary team  CKD stage 3b - baseline Cr 1.9 - 2.4 Normocytic anemia - likely combination of AKI/CKD and anemia of critical illness.  Transfuse for Hgb <7. Started aranesp 40 mcg weekly on Wednesdays on 10/2.  Iron deficiency.  Gave venofer x 1 dose on 10/1 and oral iron HFrEF - EF 60% and RV function low normal. Optimize volume as above Hyperphosphatemia: have increased sevelamer dose.  RRT as above.   Disposition - critically ill - in ICU  Estanislado Emms, MD 8:03 AM 10/03/2023

## 2023-10-03 NOTE — Progress Notes (Signed)
Progress Note  Patient Name: Timothy Mata Date of Encounter: 10/03/2023  Primary Cardiologist:   Dietrich Pates, MD   Subjective   He denies pain and says that he is breathing OK.     Inpatient Medications    Scheduled Meds:  atorvastatin  80 mg Oral q1800   Chlorhexidine Gluconate Cloth  6 each Topical Daily   [START ON 10/08/2023] darbepoetin (ARANESP) injection - NON-DIALYSIS  40 mcg Subcutaneous Q Wed-1800   finasteride  5 mg Oral Daily   fluticasone furoate-vilanterol  1 puff Inhalation Daily   heparin sodium (porcine)       iron polysaccharides  150 mg Oral Daily   levothyroxine  75 mcg Oral Q0600   lidocaine  20 mL Infiltration Once   midodrine  10 mg Oral TID WC   mouth rinse  15 mL Mouth Rinse 4 times per day   polyethylene glycol  17 g Oral Daily   sevelamer carbonate  1,600 mg Oral TID WC   tamsulosin  0.4 mg Oral QPC supper   Continuous Infusions:  sodium chloride     amiodarone 30 mg/hr (10/03/23 1610)   anticoagulant sodium citrate     norepinephrine (LEVOPHED) Adult infusion Stopped (10/03/23 0138)   piperacillin-tazobactam (ZOSYN)  IV 2.25 g (10/03/23 0606)   vancomycin Stopped (10/03/23 1200)   PRN Meds: acetaminophen, alteplase, anticoagulant sodium citrate, docusate sodium, heparin, heparin sodium (porcine), ipratropium-albuterol, ondansetron **OR** ondansetron (ZOFRAN) IV, mouth rinse, sodium chloride flush   Vital Signs    Vitals:   10/03/23 0615 10/03/23 0630 10/03/23 0645 10/03/23 0742  BP: 120/69 114/84 114/77   Pulse: 86 99 89   Resp: 16 15 15    Temp:    (!) 97.1 F (36.2 C)  TempSrc:    Oral  SpO2: 100% 99% 100%   Weight:      Height:        Intake/Output Summary (Last 24 hours) at 10/03/2023 0817 Last data filed at 10/03/2023 9604 Gross per 24 hour  Intake 2507.22 ml  Output 75 ml  Net 2432.22 ml   Filed Weights   10/02/23 0500 10/02/23 1235 10/03/23 0336  Weight: 111.7 kg 110 kg 110.9 kg    Telemetry    Atrial fib with  rate controlled - Personally Reviewed  ECG    NA - Personally Reviewed  Physical Exam   GEN: No acute distress.   Neck: No  JVD Cardiac: IrregularRR,  no murmurs, rubs, or gallops.  Respiratory:     Decreased breath sounds GI: Soft, nontender, non-distended  MS:    Moderate bilateral leg edema; No deformity. Neuro:  Nonfocal  Psych: Normal affect   Labs    Chemistry Recent Labs  Lab 10/02/23 0243 10/02/23 1345 10/03/23 0137  NA 135 136 136  K 3.8 4.1 4.5  CL 97* 98 94*  CO2 26 22 23   GLUCOSE 95 135* 126*  BUN 48* 53* 61*  CREATININE 4.69* 5.35* 5.51*  CALCIUM 7.8* 7.7* 7.8*  PROT  --  6.1*  --   ALBUMIN 2.0* 1.9* 1.8*  AST  --  34  --   ALT  --  23  --   ALKPHOS  --  86  --   BILITOT  --  1.3*  --   GFRNONAA 11* 10* 9*  ANIONGAP 12 16* 19*     Hematology Recent Labs  Lab 10/02/23 0243 10/02/23 1345 10/03/23 0137  WBC 22.4* 26.7* 31.7*  RBC 3.18* 3.10* 3.02*  HGB 9.4* 9.0* 8.7*  HCT 29.1* 28.2* 27.4*  MCV 91.5 91.0 90.7  MCH 29.6 29.0 28.8  MCHC 32.3 31.9 31.8  RDW 16.0* 16.2* 16.2*  PLT 572* 603* 525*    Cardiac EnzymesNo results for input(s): "TROPONINI" in the last 168 hours. No results for input(s): "TROPIPOC" in the last 168 hours.   BNPNo results for input(s): "BNP", "PROBNP" in the last 168 hours.   DDimer No results for input(s): "DDIMER" in the last 168 hours.   Radiology    CT Angio Chest/Abd/Pel for Dissection W and/or W/WO  Result Date: 10/02/2023 CLINICAL DATA:  Chest pain and thickening of the ascending aorta on prior noncontrast study., subsequent encounter EXAM: CT ANGIOGRAPHY CHEST, ABDOMEN AND PELVIS TECHNIQUE: Non-contrast CT of the chest was initially obtained. Multidetector CT imaging through the chest, abdomen and pelvis was performed using the standard protocol during bolus administration of intravenous contrast. Multiplanar reconstructed images and MIPs were obtained and reviewed to evaluate the vascular anatomy. RADIATION  DOSE REDUCTION: This exam was performed according to the departmental dose-optimization program which includes automated exposure control, adjustment of the mA and/or kV according to patient size and/or use of iterative reconstruction technique. CONTRAST:  80mL OMNIPAQUE IOHEXOL 350 MG/ML SOLN COMPARISON:  Noncontrast study from the previous day. FINDINGS: CTA CHEST FINDINGS Cardiovascular: Precontrast images demonstrate heavy calcification of the thoracic aorta. No hyperdense crescent to suggest acute aortic injury is noted. Postcontrast images show a focal area of contrast outpouching along the anterior aspect of the ascending aorta. This has the appearance of a penetrating ulcer. No extravasation of contrast is seen. The thoracic aortic arch and descending aorta are within normal limits. Coronary calcifications are noted. Large pericardial effusion is noted which appears mildly hyperdense compared to the cardiac blood pool on the noncontrast images. Given the findings in the ascending aorta, the possibility of prior hemorrhage into the pericardial sac would deserve consideration. Pulmonary artery shows a normal branching pattern without intraluminal filling defect to suggest pulmonary embolism. Right jugular dialysis catheter is noted as is right-sided PICC. Mediastinum/Nodes: Thoracic inlet is within normal limits. No hilar or mediastinal adenopathy is seen. Some small likely reactive lymph nodes are noted in the mediastinum. The esophagus as visualized is within normal limits. Lungs/Pleura: Large left-sided pleural effusion is noted with underlying lower lobe compressive atelectasis. The remainder of the left lung is within normal limits. Right lung is predominately well aerated. Small effusion is seen inferiorly with mild compressive atelectasis. Emphysematous changes are seen. Musculoskeletal: Prior rib fractures with surgical fixation are noted on the right. No compression deformities are seen. Review of the  MIP images confirms the above findings. CTA ABDOMEN AND PELVIS FINDINGS VASCULAR Aorta: Abdominal aorta demonstrates no aneurysmal dilatation. Heavy atherosclerotic calcifications are seen with mild mural thrombus. No dissection is noted. Celiac: Proximal narrowing of the celiac axis is noted with mild poststenotic dilatation. SMA: Calcific changes are noted at the origin of the SMA although the vessel is patent. Renals: Single renal arteries are identified bilaterally but somewhat diminutive consistent with the known end-stage renal disease. IMA: Patent without evidence of aneurysm, dissection, vasculitis or significant stenosis. Inflow: Iliacs demonstrate heavy atherosclerotic calcifications. Focal high-grade stenosis at the origin of the left common iliac artery is noted. No other focal stenosis is seen. Veins: No specific venous abnormality is noted. Review of the MIP images confirms the above findings. NON-VASCULAR Hepatobiliary: No focal liver abnormality is seen. No gallstones, gallbladder wall thickening, or biliary dilatation. Pancreas: Unremarkable. No  pancreatic ductal dilatation or surrounding inflammatory changes. Spleen: Normal in size without focal abnormality. Adrenals/Urinary Tract: Adrenal glands are within normal limits. Kidneys are somewhat thinned consistent with the known end-stage renal disease. No renal calculi or obstructive changes are noted. The bladder is decompressed by Foley catheter. Stomach/Bowel: No obstructive or inflammatory changes of the colon are noted. The appendix is within normal limits. Small bowel and stomach unremarkable. Lymphatic: No lymphadenopathy is noted. Reproductive: Prostate is unremarkable. Other: Mild stranding is noted along the anterior aspect of the distal abdominal aorta extending into the pelvis similar to that seen on the prior exam but of uncertain significance. Musculoskeletal: No acute or significant osseous findings. Review of the MIP images confirms  the above findings. IMPRESSION: CTA of the chest: No evidence of pulmonary emboli. Findings consistent with a penetrating ulcer in the ascending aorta anteriorly. This has a somewhat lobulated appearance. So she aided mildly hyperdense pericardial effusion is noted. The possibility of prior hemorrhage into the pericardial space would deserve consideration. Vascular surgery consultation is recommended. Bilateral pleural effusions left considerably greater than right with associated compressive atelectasis. CTA of the abdomen and pelvis: Narrowing of the celiac axis proximally with mild poststenotic dilatation. High-grade stenosis of the left common iliac artery just beyond its origin. No other focal abnormality is noted. These results will be called to the ordering clinician or representative by the Radiologist Assistant, and communication documented in the PACS or [employer]. Electronically Signed   By: Alcide Clever M.D.   On: 10/02/2023 20:10   CT CHEST ABDOMEN PELVIS WO CONTRAST  Result Date: 10/02/2023 CLINICAL DATA:  Intentional weight loss, abdominal pain. EXAM: CT CHEST, ABDOMEN AND PELVIS WITHOUT CONTRAST TECHNIQUE: Multidetector CT imaging of the chest, abdomen and pelvis was performed following the standard protocol without IV contrast. RADIATION DOSE REDUCTION: This exam was performed according to the departmental dose-optimization program which includes automated exposure control, adjustment of the mA and/or kV according to patient size and/or use of iterative reconstruction technique. COMPARISON:  09-27-23. FINDINGS: CT CHEST FINDINGS Cardiovascular: Atherosclerosis of thoracic aorta is noted without aneurysm formation. Mild cardiomegaly is noted. Moderate pericardial effusion is noted. Mild wall thickening of ascending thoracic aorta is noted; intramural hematoma cannot be excluded. Coronary artery calcifications are noted. Mediastinum/Nodes: No enlarged mediastinal, hilar, or  axillary lymph nodes. Thyroid gland, trachea, and esophagus demonstrate no significant findings. Lungs/Pleura: No pneumothorax is noted. Large multiloculated left pleural effusion is noted. Smaller loculated right pleural effusion is noted in right lung base extending into major fissure. Musculoskeletal: Status post surgical internal fixation of multiple right ribs. No acute osseous abnormality is noted. CT ABDOMEN PELVIS FINDINGS Hepatobiliary: No focal liver abnormality is seen. No gallstones, gallbladder wall thickening, or biliary dilatation. Pancreas: Unremarkable. No pancreatic ductal dilatation or surrounding inflammatory changes. Spleen: Normal in size without focal abnormality. Adrenals/Urinary Tract: Adrenal glands and kidneys appear normal. Urinary bladder is decompressed secondary to Foley catheter. Moderate wall thickening is noted which may be due to lack of distension, but cystitis cannot be excluded. Stomach/Bowel: Stomach is within normal limits. Appendix appears normal. No evidence of bowel wall thickening, distention, or inflammatory changes. Vascular/Lymphatic: Aortic atherosclerosis. No enlarged abdominal or pelvic lymph nodes. Reproductive: Mild prostatic enlargement. Other: Small fat containing right inguinal hernia. Minimal fluid or inflammatory stranding is noted posteriorly in the pelvis of uncertain etiology. Musculoskeletal: No acute or significant osseous findings. IMPRESSION: Mild wall thickening of ascending thoracic aorta is noted. This is concerning for possible intramural  hematoma, and CT angiography of the chest is recommended for further evaluation, if the patient's renal status will allow the administration of intravenous contrast. These results will be called to the ordering clinician or representative by the Radiologist Assistant, and communication documented in the PACS or [employer]. Moderate pericardial effusion is noted. Large multiloculated left pleural effusion is  noted. Smaller loculated pleural effusion is noted in right lung base. Urinary bladder is decompressed secondary to Foley catheter. Moderate wall thickening is noted which may be due to lack of distension, but cystitis cannot be excluded. Minimal fluid or inflammatory stranding is noted posteriorly in the pelvis of uncertain etiology. Mild prostatic enlargement. Coronary artery calcifications are noted suggesting coronary artery disease. Aortic Atherosclerosis (ICD10-I70.0). Electronically Signed   By: Lupita Raider M.D.   On: 10/02/2023 14:36   DG Chest Port 1 View  Result Date: 10/02/2023 CLINICAL DATA:  Sepsis EXAM: PORTABLE CHEST 1 VIEW COMPARISON:  09/30/2023 FINDINGS: Large left pleural effusion likely with loculated components. Small right pleural effusion along the costophrenic angle and minor fissure. Right IJ central line tip: Cavoatrial junction. Right PICC line tip: Lower SVC. Atheromatous vascular calcification of the thoracic aorta. Moderate enlargement of the cardiopericardial silhouette. Substantial passive atelectasis vertically on the left side due to the prominent multiloculated effusion. Right rib plates noted. IMPRESSION: 1. Large left pleural effusion likely with loculated components and substantial passive atelectasis. 2. Small right pleural effusion along the costophrenic angle and minor fissure. 3. Moderate enlargement of the cardiopericardial silhouette. 4. Right IJ central line tip: Cavoatrial junction. Right PICC line tip: Lower SVC. Electronically Signed   By: Gaylyn Rong M.D.   On: 10/02/2023 13:34    Cardiac Studies   Echo pending.  Bedside with pericardial effusion.  This is likely increased from the small effusion noted on admission EKG.  Likely hemopericardium  Patient Profile     89 y.o. male Patient with PVD, HTN.  Status post PEA arrest.  Developed atrial fib.  Now with penatrating ulder of the ascending aorta with pericardial effusion and pleural effusion.    Assessment & Plan    Ruptured ascending aorta:  Not a surgical candidate.  Hospice care would be appropriate if that is the decision of the family and patient.  I talked to him this morning about the gravity of this diagnosis.  Palliative care began conversations about goals of therapy prior to the CT last night.      Atrial fib:  On IV amiodarone.   OK to continue until we decide on goals of therapy.    For questions or updates, please contact [facility] Please consult [email or web address] for contact info under Cardiology/STEMI.   Signed, Rollene Rotunda, MD  10/03/2023, 8:17 AM

## 2023-10-03 NOTE — Procedures (Signed)
HD note  Dr. Malen Gauze at bedside prior to the start of HD. Patient treatment was canceled this morning due to medical issues.

## 2023-10-03 NOTE — Progress Notes (Signed)
Report called and vitals w/in order set

## 2023-10-03 NOTE — Progress Notes (Addendum)
NAME:  Timothy Mata, MRN:  [id number], DOB:  1933-07-18, LOS: 13 ADMISSION DATE:  09/03/2023, CONSULTATION DATE:  9/22 REFERRING MD:  Dr. Sherryll Burger, CHIEF COMPLAINT:  cardiac arrest   History of Present Illness:  Patient is a 89 yo M w/ pertinent PMH a flutter on Eliquis, COPD, HFrEF, HTN, HLD presents to [facility] ED on 9/21 with abdominal pain.  Patient having 1 week of abdominal pain that progressively worsened.  Came to [facility] ED on 9/21.  Denies any fevers, N/V/D.  Has had increased urinary frequency.  CXR with mild pulm edema.  CT ABD/pelvis no acute abnormality; mild left hydroureteronephrosis; small bilateral pleural effusions.  WBC/LA elevated. UA with nitrite and large leukocytes. Cultures obtained and given IV fluids.  Patient started on Rocephin.    On 9/22 patient with increased hypotension.  Patient given IV fluids and transferred to ICU.  Patient had PEA arrest with ROSC in 10 minutes.  Patient intubated post CPR.  CT head with no acute abnormality.  CXR with pulmonary edema.  Transferring to [facility] ED.  PCCM consulted.  10/02/2023 pulmonary critical care called back to bedside for hypotension in the setting of history of out-of-hospital arrest now proven refractory to current treatments needs transfer to intensive care unit vasopressor support and resuming antimicrobial therapy.  Pertinent  Medical History   Past Medical History:  Diagnosis Date   Atrial flutter (HCC)    a. s/p DCCV in 03/2022 b. repeat EKG at follow-up from DCCV showing atrial fibrillation   BPH (benign prostatic hyperplasia)    COPD (chronic obstructive pulmonary disease) (HCC)    Dysrhythmia    Gout    "left big toe"   History of kidney stones    HOH (hard of hearing)    Hypercholesterolemia    Hypertension    Preretinal fibrosis, left eye    Spinal headache 12/30/1972    Significant Hospital Events: Including procedures, antibiotic start and stop dates in addition to other pertinent events   9/21 admitted to aph  w/ sepsis and uti 9/22 pea arrest; transfer to [facility] 9/23 extubated  9/25 to [facility] 10/1 urology consulted for hematuria  10/3 PCCM called back for hypotension, pressor requirement, zosyn/ vanc restarted  cardiology and TCTS consult, CTA chest  Interim History / Subjective:  Holding off on iHD this am Off pressors overnight Afebrile Pt denies any pain, new/worsening SOB  Objective   Blood pressure 114/77, pulse 89, temperature (!) 97.1 F (36.2 C), temperature source Oral, resp. rate 15, height 5\' 10"  (1.778 m), weight 110.9 kg, SpO2 100%.        Intake/Output Summary (Last 24 hours) at 10/03/2023 0746 Last data filed at 10/03/2023 7829 Gross per 24 hour  Intake 2507.22 ml  Output 75 ml  Net 2432.22 ml   Filed Weights   10/02/23 0500 10/02/23 1235 10/03/23 0336  Weight: 111.7 kg 110 kg 110.9 kg    Examination: General:  chronically ill appearing elderly male lying in bed HEENT: MM pink/dry, chapped lips, pupils 3/r, anicteric Neuro: Awake, oriented person, place, year, MAE-weakly  CV: afib- rate 80's, R internal jugular HD and RUE PICC PULM:  non labored, slight exp wheeze throughout, scattered rhonchi L> R, diminished left base, Aneth GI: obese, soft, +bs, NT, foley- brown colored, no apparent clots Extremities: warm/dry, generalized edema, +3 LE pitting edema Skin: no rashes   Afebrile UOP 75 ml/ 24hrs +2.4L Net +1L  Labs> increasing leukocytosis, stable H/H, K 4.5, Mag 2.2  10/2 BC> ngtd  10/3 CTA c/a/p w/contrast > intramural hematoma of the ascending aorta w/ thrombus and likely clot in pericardium, large left loculated pleural effusion Echo 10/3 PM> bedside by cardiology EF 45% in Afib w/RVR, poor windows, probably moderate effusion with echodense in pericardial space- likely coagulum, c/w hemorrhagic pericardial effusion likely, no chamber collapse, IVC dilated  Resolved Hospital Problem list     Assessment & Plan:   PEA arrest from outside hospital Shock>  initially septic, resolved, possible recurrent sepsis/ UTI 10/3 vs cardiogenic Penetrating ulcer in the ascending aorta with thrombus with circumferential  hemorrhagic pericardial effusion (without tamponade physiology)  HFrEF/ HFpEF HTN P:  - off levophed overnight.  Goal MAP > 65 - HR remains afib, rate controlled on amio gtt.  Not likely to tolerate RVR - appreciate cardiology and TCTS input.  Not a surgical candidate per TCTS, poor prognosis.  Cardiology feels rate issues are secondary to underlying illness.   - cont amio gtt for now.  Optimize electrolytes - repeat echo pending  - cont broad abx> zosyn/ vanc for now.  Remains afebrile, rising leukocytosis (suspected due to cardiac issues although PCT 0.64> 4.47 - follow repeat BC and UC 10/2 - no imminent HD needs, holding off on iHD this am as to minimize further cardiac stress pending further GOC discussions with patient and family later today with PMT.  Appreciate PMT assistance   Acute respiratory failure post arrest Pulmonary edema  Bilateral pleural effusions, left loculated effusion and small  Hx COPD Plan: - cont supplemental O2 prn for sat goal > 92% - cont breo ellipta and duonebs prn  - ongoing pulmonary hygiene - caution with volume removal as above given cardiac findings.  Ongoing GOC discussions  Afib/flutter on eliquis and amio Plan: - remains currently rate controlled in Afib - cont amio - cont to hold [facility] and ASA - cardiology following - optimize electrolytes  Hypokalemia AKI on CKD3b, suspected ATN Hematuria - temp HD cath placed by IR 9/28, first HD 9/28 Plan: - Nephrology following, MWF iHD, making some urine.  Holding off on today's HD pending further GOC.  No significant electrolyte abnormalities  - cont foley- monitor for further clots - trend renal indices  - strict I/Os, daily wts - avoid nephrotoxins, renal dose meds, hemodynamic support as above  Anemia of chronic disease - H/H stable, cont  to trend   Hyperglycemia - likely reactive/ stress response - SSI prn   Physical deconditioning Goals of care - overall prognosis is poor.  PMT following, family meeting scheduled today ~1400.  Remains a full code for now  Best Practice (right click and "Reselect all SmartList Selections" daily)   Diet/type: NPO DVT prophylaxis: SCD GI prophylaxis: H2B Lines: N/A; PICC line, right HD catheter  Foley:  Yes, and it is still needed Code Status:  full code Last date of multidisciplinary goals of care discussion [9/22 updated wife over phone]  Pt updated at bedside 10/4 am.  Does not recall what TCTS told him last night.  Pending family meeting today with PMT this afternoon.  No family at bedside.      PULMONARY No results for input(s): "PHART", "PCO2ART", "PO2ART", "HCO3", "TCO2", "O2SAT" in the last 168 hours.  Invalid input(s): "PCO2", "PO2"   CBC Recent Labs  Lab 10/02/23 0243 10/02/23 1345 10/03/23 0137  HGB 9.4* 9.0* 8.7*  HCT 29.1* 28.2* 27.4*  WBC 22.4* 26.7* 31.7*  PLT 572* 603* 525*    COAGULATION Recent Labs  Lab 10/02/23 1345  INR 1.9*     CARDIAC  No results for input(s): "TROPONINI" in the last 168 hours. No results for input(s): "PROBNP" in the last 168 hours.   CHEMISTRY Recent Labs  Lab 09/29/23 0413 09/30/23 0216 10/01/23 0339 10/02/23 0243 10/02/23 1345 10/03/23 0137  NA 137 135 136 135 136 136  K 3.8 3.9 4.0 3.8 4.1 4.5  CL 103 97* 100 97* 98 94*  CO2 19* 22 22 26 22 23   GLUCOSE 103* 103* 91 95 135* 126*  BUN 97* 68* 79* 48* 53* 61*  CREATININE 8.20* 6.37* 6.99* 4.69* 5.35* 5.51*  CALCIUM 8.0* 7.8* 8.0* 7.8* 7.7* 7.8*  MG 2.8* 2.3 2.6* 2.1  --  2.2  PHOS 9.1* 7.5* 8.9* 5.7*  --  7.8*   Estimated Creatinine Clearance: 11.8 mL/min (A) (by C-G formula based on SCr of 5.51 mg/dL (H)).   LIVER Recent Labs  Lab 09/30/23 0216 10/01/23 0339 10/02/23 0243 10/02/23 1345 10/03/23 0137  AST  --   --   --  34  --   ALT  --   --    --  23  --   ALKPHOS  --   --   --  86  --   BILITOT  --   --   --  1.3*  --   PROT  --   --   --  6.1*  --   ALBUMIN 2.0* 2.0* 2.0* 1.9* 1.8*  INR  --   --   --  1.9*  --      INFECTIOUS Recent Labs  Lab 09/30/23 0215 10/02/23 0243 10/02/23 1345 10/02/23 1734 10/03/23 0137  LATICACIDVEN  --   --  3.0* 1.8  --   PROCALCITON 1.12 0.64  --   --  4.47     ENDOCRINE CBG (last 3)  Recent Labs    10/02/23 1105 10/02/23 1229 10/02/23 2204  GLUCAP 129* 118* 126*         IMAGING x48h  - image(s) personally visualized  -   highlighted in bold CT Angio Chest/Abd/Pel for Dissection W and/or W/WO  Result Date: 10/02/2023 CLINICAL DATA:  Chest pain and thickening of the ascending aorta on prior noncontrast study., subsequent encounter EXAM: CT ANGIOGRAPHY CHEST, ABDOMEN AND PELVIS TECHNIQUE: Non-contrast CT of the chest was initially obtained. Multidetector CT imaging through the chest, abdomen and pelvis was performed using the standard protocol during bolus administration of intravenous contrast. Multiplanar reconstructed images and MIPs were obtained and reviewed to evaluate the vascular anatomy. RADIATION DOSE REDUCTION: This exam was performed according to the departmental dose-optimization program which includes automated exposure control, adjustment of the mA and/or kV according to patient size and/or use of iterative reconstruction technique. CONTRAST:  80mL OMNIPAQUE IOHEXOL 350 MG/ML SOLN COMPARISON:  Noncontrast study from the previous day. FINDINGS: CTA CHEST FINDINGS Cardiovascular: Precontrast images demonstrate heavy calcification of the thoracic aorta. No hyperdense crescent to suggest acute aortic injury is noted. Postcontrast images show a focal area of contrast outpouching along the anterior aspect of the ascending aorta. This has the appearance of a penetrating ulcer. No extravasation of contrast is seen. The thoracic aortic arch and descending aorta are within normal  limits. Coronary calcifications are noted. Large pericardial effusion is noted which appears mildly hyperdense compared to the cardiac blood pool on the noncontrast images. Given the findings in the ascending aorta, the possibility of prior hemorrhage into the pericardial sac would deserve consideration. Pulmonary artery shows a normal branching pattern  without intraluminal filling defect to suggest pulmonary embolism. Right jugular dialysis catheter is noted as is right-sided PICC. Mediastinum/Nodes: Thoracic inlet is within normal limits. No hilar or mediastinal adenopathy is seen. Some small likely reactive lymph nodes are noted in the mediastinum. The esophagus as visualized is within normal limits. Lungs/Pleura: Large left-sided pleural effusion is noted with underlying lower lobe compressive atelectasis. The remainder of the left lung is within normal limits. Right lung is predominately well aerated. Small effusion is seen inferiorly with mild compressive atelectasis. Emphysematous changes are seen. Musculoskeletal: Prior rib fractures with surgical fixation are noted on the right. No compression deformities are seen. Review of the MIP images confirms the above findings. CTA ABDOMEN AND PELVIS FINDINGS VASCULAR Aorta: Abdominal aorta demonstrates no aneurysmal dilatation. Heavy atherosclerotic calcifications are seen with mild mural thrombus. No dissection is noted. Celiac: Proximal narrowing of the celiac axis is noted with mild poststenotic dilatation. SMA: Calcific changes are noted at the origin of the SMA although the vessel is patent. Renals: Single renal arteries are identified bilaterally but somewhat diminutive consistent with the known end-stage renal disease. IMA: Patent without evidence of aneurysm, dissection, vasculitis or significant stenosis. Inflow: Iliacs demonstrate heavy atherosclerotic calcifications. Focal high-grade stenosis at the origin of the left common iliac artery is noted. No  other focal stenosis is seen. Veins: No specific venous abnormality is noted. Review of the MIP images confirms the above findings. NON-VASCULAR Hepatobiliary: No focal liver abnormality is seen. No gallstones, gallbladder wall thickening, or biliary dilatation. Pancreas: Unremarkable. No pancreatic ductal dilatation or surrounding inflammatory changes. Spleen: Normal in size without focal abnormality. Adrenals/Urinary Tract: Adrenal glands are within normal limits. Kidneys are somewhat thinned consistent with the known end-stage renal disease. No renal calculi or obstructive changes are noted. The bladder is decompressed by Foley catheter. Stomach/Bowel: No obstructive or inflammatory changes of the colon are noted. The appendix is within normal limits. Small bowel and stomach unremarkable. Lymphatic: No lymphadenopathy is noted. Reproductive: Prostate is unremarkable. Other: Mild stranding is noted along the anterior aspect of the distal abdominal aorta extending into the pelvis similar to that seen on the prior exam but of uncertain significance. Musculoskeletal: No acute or significant osseous findings. Review of the MIP images confirms the above findings. IMPRESSION: CTA of the chest: No evidence of pulmonary emboli. Findings consistent with a penetrating ulcer in the ascending aorta anteriorly. This has a somewhat lobulated appearance. So she aided mildly hyperdense pericardial effusion is noted. The possibility of prior hemorrhage into the pericardial space would deserve consideration. Vascular surgery consultation is recommended. Bilateral pleural effusions left considerably greater than right with associated compressive atelectasis. CTA of the abdomen and pelvis: Narrowing of the celiac axis proximally with mild poststenotic dilatation. High-grade stenosis of the left common iliac artery just beyond its origin. No other focal abnormality is noted. These results will be called to the ordering clinician or  representative by the Radiologist Assistant, and communication documented in the PACS or [employer]. Electronically Signed   By: Alcide Clever M.D.   On: 10/02/2023 20:10   CT CHEST ABDOMEN PELVIS WO CONTRAST  Result Date: 10/02/2023 CLINICAL DATA:  Intentional weight loss, abdominal pain. EXAM: CT CHEST, ABDOMEN AND PELVIS WITHOUT CONTRAST TECHNIQUE: Multidetector CT imaging of the chest, abdomen and pelvis was performed following the standard protocol without IV contrast. RADIATION DOSE REDUCTION: This exam was performed according to the departmental dose-optimization program which includes automated exposure control, adjustment of the mA and/or kV according to  patient size and/or use of iterative reconstruction technique. COMPARISON:  10/12/2023. FINDINGS: CT CHEST FINDINGS Cardiovascular: Atherosclerosis of thoracic aorta is noted without aneurysm formation. Mild cardiomegaly is noted. Moderate pericardial effusion is noted. Mild wall thickening of ascending thoracic aorta is noted; intramural hematoma cannot be excluded. Coronary artery calcifications are noted. Mediastinum/Nodes: No enlarged mediastinal, hilar, or axillary lymph nodes. Thyroid gland, trachea, and esophagus demonstrate no significant findings. Lungs/Pleura: No pneumothorax is noted. Large multiloculated left pleural effusion is noted. Smaller loculated right pleural effusion is noted in right lung base extending into major fissure. Musculoskeletal: Status post surgical internal fixation of multiple right ribs. No acute osseous abnormality is noted. CT ABDOMEN PELVIS FINDINGS Hepatobiliary: No focal liver abnormality is seen. No gallstones, gallbladder wall thickening, or biliary dilatation. Pancreas: Unremarkable. No pancreatic ductal dilatation or surrounding inflammatory changes. Spleen: Normal in size without focal abnormality. Adrenals/Urinary Tract: Adrenal glands and kidneys appear normal. Urinary bladder is decompressed  secondary to Foley catheter. Moderate wall thickening is noted which may be due to lack of distension, but cystitis cannot be excluded. Stomach/Bowel: Stomach is within normal limits. Appendix appears normal. No evidence of bowel wall thickening, distention, or inflammatory changes. Vascular/Lymphatic: Aortic atherosclerosis. No enlarged abdominal or pelvic lymph nodes. Reproductive: Mild prostatic enlargement. Other: Small fat containing right inguinal hernia. Minimal fluid or inflammatory stranding is noted posteriorly in the pelvis of uncertain etiology. Musculoskeletal: No acute or significant osseous findings. IMPRESSION: Mild wall thickening of ascending thoracic aorta is noted. This is concerning for possible intramural hematoma, and CT angiography of the chest is recommended for further evaluation, if the patient's renal status will allow the administration of intravenous contrast. These results will be called to the ordering clinician or representative by the Radiologist Assistant, and communication documented in the PACS or [employer]. Moderate pericardial effusion is noted. Large multiloculated left pleural effusion is noted. Smaller loculated pleural effusion is noted in right lung base. Urinary bladder is decompressed secondary to Foley catheter. Moderate wall thickening is noted which may be due to lack of distension, but cystitis cannot be excluded. Minimal fluid or inflammatory stranding is noted posteriorly in the pelvis of uncertain etiology. Mild prostatic enlargement. Coronary artery calcifications are noted suggesting coronary artery disease. Aortic Atherosclerosis (ICD10-I70.0). Electronically Signed   By: Lupita Raider M.D.   On: 10/02/2023 14:36   DG Chest Port 1 View  Result Date: 10/02/2023 CLINICAL DATA:  Sepsis EXAM: PORTABLE CHEST 1 VIEW COMPARISON:  09/30/2023 FINDINGS: Large left pleural effusion likely with loculated components. Small right pleural effusion along the  costophrenic angle and minor fissure. Right IJ central line tip: Cavoatrial junction. Right PICC line tip: Lower SVC. Atheromatous vascular calcification of the thoracic aorta. Moderate enlargement of the cardiopericardial silhouette. Substantial passive atelectasis vertically on the left side due to the prominent multiloculated effusion. Right rib plates noted. IMPRESSION: 1. Large left pleural effusion likely with loculated components and substantial passive atelectasis. 2. Small right pleural effusion along the costophrenic angle and minor fissure. 3. Moderate enlargement of the cardiopericardial silhouette. 4. Right IJ central line tip: Cavoatrial junction. Right PICC line tip: Lower SVC. Electronically Signed   By: Gaylyn Rong M.D.   On: 10/02/2023 13:34      Critical care time:  35 minutes     Posey Boyer, MSN, AG-ACNP-BC  Pulmonary & Critical Care 10/03/2023, 7:47 AM  See Amion for pager If no response to pager , please call [phone] until 7pm After 7:00 pm  call Elink  [phone]

## 2023-10-03 NOTE — Plan of Care (Signed)
Palliative care reached out.  He is to go home with hospice.   Appreciate all teams involved in the care of this patient   Nephrology will sign off.    Estanislado Emms, MD 8:53 PM 10/03/2023

## 2023-10-03 NOTE — Progress Notes (Signed)
PT Cancellation Note  Patient Details Name: Timothy Mata MRN: [id number] DOB: Sep 17, 1933   Cancelled Treatment:    Reason Eval/Treat Not Completed: Patient at procedure or test/unavailable (HD initiating session)   Timothy Mata B Coulson Wehner 10/03/2023, 7:50 AM Merryl Hacker, PT Acute Rehabilitation Services Office: [phone]

## 2023-10-03 NOTE — Progress Notes (Signed)
PT Cancellation Note  Patient Details Name: Timothy Mata MRN: [id number] DOB: 10-Jul-1933   Cancelled Treatment:    Reason Eval/Treat Not Completed: Patient declined, no reason specified (pt reports too fatigued post HD to participate)   Enedina Finner Cyra Spader 10/03/2023, 11:30 AM Merryl Hacker, PT Acute Rehabilitation Services Office: [phone]

## 2023-10-03 NOTE — Progress Notes (Signed)
Subjective: Timothy Mata. Pt sleeping, did not wake him on rounds. No active hematuria. Coffee colored urine with old blood sediment in tubing.  Patient has been transferred to ICU in critical condition with ruptured ascending aorta, pericardial effusion, and pleural effusion.  Objective: Vital signs in last 24 hours: Temp:  [97.1 F (36.2 C)-97.9 F (36.6 C)] 97.1 F (36.2 C) (10/04 0742) Pulse Rate:  [26-126] 89 (10/04 0645) Resp:  [14-27] 15 (10/04 0645) BP: (58-129)/(40-116) 114/77 (10/04 0645) SpO2:  [84 %-100 %] 100 % (10/04 0645) Weight:  [110 kg-110.9 kg] 110.9 kg (10/04 0336)  Assessment/Plan:  Intake/Output from previous day: 10/03 0701 - 10/04 0700 In: 2507.2 [I.V.:981; IV Piggyback:1526.2] Out: 75 [Urine:75]  #Gross Hematuria After Catheter Placement / Prostatic Hypertrophy  Traumatic foley placement in the setting of BPH and blood thinners.  Continue flomax and finasteride through discharge Trend labs.  OK to DC catheter when not needed for accurate UOP monitoring.   On assessment today patient had dark old blood in foley tubing with no evidence of active bleeding.  Reviewed case and plan with critical care team.  His grave grave cardiac condition makes him a poor candidate for any form of anesthesia.  The shared decision was made not to consider placing hematuria catheter and hand irrigation.  The risk versus benefit of probable trauma from catheter exchange was not in his best interest.  He is urinating without difficulty and there is no acute urologic intervention indicated as long as his present catheter is not becoming obstructed.  We will sign off at this time and please reach out if there are any questions, concerns, or acute urologic changes.    Intake/Output this shift: No intake/output data recorded.  Physical Exam:  General: sleeping, appears stated age CV: No cyanosis Lungs: equal chest rise Gu: foley in place draining coffee colored urine with old blood  sediment in tubing.   Lab Results: Recent Labs    10/02/23 0243 10/02/23 1345 10/03/23 0137  HGB 9.4* 9.0* 8.7*  HCT 29.1* 28.2* 27.4*   BMET Recent Labs    10/02/23 1345 10/03/23 0137  NA 136 136  K 4.1 4.5  CL 98 94*  CO2 22 23  GLUCOSE 135* 126*  BUN 53* 61*  CREATININE 5.35* 5.51*  CALCIUM 7.7* 7.8*     Studies/Results: CT Angio Chest/Abd/Pel for Dissection W and/or W/WO  Result Date: 10/02/2023 CLINICAL DATA:  Chest pain and thickening of the ascending aorta on prior noncontrast study., subsequent encounter EXAM: CT ANGIOGRAPHY CHEST, ABDOMEN AND PELVIS TECHNIQUE: Non-contrast CT of the chest was initially obtained. Multidetector CT imaging through the chest, abdomen and pelvis was performed using the standard protocol during bolus administration of intravenous contrast. Multiplanar reconstructed images and MIPs were obtained and reviewed to evaluate the vascular anatomy. RADIATION DOSE REDUCTION: This exam was performed according to the departmental dose-optimization program which includes automated exposure control, adjustment of the mA and/or kV according to patient size and/or use of iterative reconstruction technique. CONTRAST:  80mL OMNIPAQUE IOHEXOL 350 MG/ML SOLN COMPARISON:  Noncontrast study from the previous day. FINDINGS: CTA CHEST FINDINGS Cardiovascular: Precontrast images demonstrate heavy calcification of the thoracic aorta. No hyperdense crescent to suggest acute aortic injury is noted. Postcontrast images show a focal area of contrast outpouching along the anterior aspect of the ascending aorta. This has the appearance of a penetrating ulcer. No extravasation of contrast is seen. The thoracic aortic arch and descending aorta are within normal limits. Coronary calcifications  are noted. Large pericardial effusion is noted which appears mildly hyperdense compared to the cardiac blood pool on the noncontrast images. Given the findings in the ascending aorta, the  possibility of prior hemorrhage into the pericardial sac would deserve consideration. Pulmonary artery shows a normal branching pattern without intraluminal filling defect to suggest pulmonary embolism. Right jugular dialysis catheter is noted as is right-sided PICC. Mediastinum/Nodes: Thoracic inlet is within normal limits. No hilar or mediastinal adenopathy is seen. Some small likely reactive lymph nodes are noted in the mediastinum. The esophagus as visualized is within normal limits. Lungs/Pleura: Large left-sided pleural effusion is noted with underlying lower lobe compressive atelectasis. The remainder of the left lung is within normal limits. Right lung is predominately well aerated. Small effusion is seen inferiorly with mild compressive atelectasis. Emphysematous changes are seen. Musculoskeletal: Prior rib fractures with surgical fixation are noted on the right. No compression deformities are seen. Review of the MIP images confirms the above findings. CTA ABDOMEN AND PELVIS FINDINGS VASCULAR Aorta: Abdominal aorta demonstrates no aneurysmal dilatation. Heavy atherosclerotic calcifications are seen with mild mural thrombus. No dissection is noted. Celiac: Proximal narrowing of the celiac axis is noted with mild poststenotic dilatation. SMA: Calcific changes are noted at the origin of the SMA although the vessel is patent. Renals: Single renal arteries are identified bilaterally but somewhat diminutive consistent with the known end-stage renal disease. IMA: Patent without evidence of aneurysm, dissection, vasculitis or significant stenosis. Inflow: Iliacs demonstrate heavy atherosclerotic calcifications. Focal high-grade stenosis at the origin of the left common iliac artery is noted. No other focal stenosis is seen. Veins: No specific venous abnormality is noted. Review of the MIP images confirms the above findings. NON-VASCULAR Hepatobiliary: No focal liver abnormality is seen. No gallstones, gallbladder  wall thickening, or biliary dilatation. Pancreas: Unremarkable. No pancreatic ductal dilatation or surrounding inflammatory changes. Spleen: Normal in size without focal abnormality. Adrenals/Urinary Tract: Adrenal glands are within normal limits. Kidneys are somewhat thinned consistent with the known end-stage renal disease. No renal calculi or obstructive changes are noted. The bladder is decompressed by Foley catheter. Stomach/Bowel: No obstructive or inflammatory changes of the colon are noted. The appendix is within normal limits. Small bowel and stomach unremarkable. Lymphatic: No lymphadenopathy is noted. Reproductive: Prostate is unremarkable. Other: Mild stranding is noted along the anterior aspect of the distal abdominal aorta extending into the pelvis similar to that seen on the prior exam but of uncertain significance. Musculoskeletal: No acute or significant osseous findings. Review of the MIP images confirms the above findings. IMPRESSION: CTA of the chest: No evidence of pulmonary emboli. Findings consistent with a penetrating ulcer in the ascending aorta anteriorly. This has a somewhat lobulated appearance. So she aided mildly hyperdense pericardial effusion is noted. The possibility of prior hemorrhage into the pericardial space would deserve consideration. Vascular surgery consultation is recommended. Bilateral pleural effusions left considerably greater than right with associated compressive atelectasis. CTA of the abdomen and pelvis: Narrowing of the celiac axis proximally with mild poststenotic dilatation. High-grade stenosis of the left common iliac artery just beyond its origin. No other focal abnormality is noted. These results will be called to the ordering clinician or representative by the Radiologist Assistant, and communication documented in the PACS or [employer]. Electronically Signed   By: Alcide Clever M.D.   On: 10/02/2023 20:10   CT CHEST ABDOMEN PELVIS WO CONTRAST  Result  Date: 10/02/2023 CLINICAL DATA:  Intentional weight loss, abdominal pain. EXAM: CT CHEST, ABDOMEN AND  PELVIS WITHOUT CONTRAST TECHNIQUE: Multidetector CT imaging of the chest, abdomen and pelvis was performed following the standard protocol without IV contrast. RADIATION DOSE REDUCTION: This exam was performed according to the departmental dose-optimization program which includes automated exposure control, adjustment of the mA and/or kV according to patient size and/or use of iterative reconstruction technique. COMPARISON:  September 20, 2023. FINDINGS: CT CHEST FINDINGS Cardiovascular: Atherosclerosis of thoracic aorta is noted without aneurysm formation. Mild cardiomegaly is noted. Moderate pericardial effusion is noted. Mild wall thickening of ascending thoracic aorta is noted; intramural hematoma cannot be excluded. Coronary artery calcifications are noted. Mediastinum/Nodes: No enlarged mediastinal, hilar, or axillary lymph nodes. Thyroid gland, trachea, and esophagus demonstrate no significant findings. Lungs/Pleura: No pneumothorax is noted. Large multiloculated left pleural effusion is noted. Smaller loculated right pleural effusion is noted in right lung base extending into major fissure. Musculoskeletal: Status post surgical internal fixation of multiple right ribs. No acute osseous abnormality is noted. CT ABDOMEN PELVIS FINDINGS Hepatobiliary: No focal liver abnormality is seen. No gallstones, gallbladder wall thickening, or biliary dilatation. Pancreas: Unremarkable. No pancreatic ductal dilatation or surrounding inflammatory changes. Spleen: Normal in size without focal abnormality. Adrenals/Urinary Tract: Adrenal glands and kidneys appear normal. Urinary bladder is decompressed secondary to Foley catheter. Moderate wall thickening is noted which may be due to lack of distension, but cystitis cannot be excluded. Stomach/Bowel: Stomach is within normal limits. Appendix appears normal. No evidence of  bowel wall thickening, distention, or inflammatory changes. Vascular/Lymphatic: Aortic atherosclerosis. No enlarged abdominal or pelvic lymph nodes. Reproductive: Mild prostatic enlargement. Other: Small fat containing right inguinal hernia. Minimal fluid or inflammatory stranding is noted posteriorly in the pelvis of uncertain etiology. Musculoskeletal: No acute or significant osseous findings. IMPRESSION: Mild wall thickening of ascending thoracic aorta is noted. This is concerning for possible intramural hematoma, and CT angiography of the chest is recommended for further evaluation, if the patient's renal status will allow the administration of intravenous contrast. These results will be called to the ordering clinician or representative by the Radiologist Assistant, and communication documented in the PACS or [employer]. Moderate pericardial effusion is noted. Large multiloculated left pleural effusion is noted. Smaller loculated pleural effusion is noted in right lung base. Urinary bladder is decompressed secondary to Foley catheter. Moderate wall thickening is noted which may be due to lack of distension, but cystitis cannot be excluded. Minimal fluid or inflammatory stranding is noted posteriorly in the pelvis of uncertain etiology. Mild prostatic enlargement. Coronary artery calcifications are noted suggesting coronary artery disease. Aortic Atherosclerosis (ICD10-I70.0). Electronically Signed   By: Lupita Raider M.D.   On: 10/02/2023 14:36   DG Chest Port 1 View  Result Date: 10/02/2023 CLINICAL DATA:  Sepsis EXAM: PORTABLE CHEST 1 VIEW COMPARISON:  09/30/2023 FINDINGS: Large left pleural effusion likely with loculated components. Small right pleural effusion along the costophrenic angle and minor fissure. Right IJ central line tip: Cavoatrial junction. Right PICC line tip: Lower SVC. Atheromatous vascular calcification of the thoracic aorta. Moderate enlargement of the cardiopericardial  silhouette. Substantial passive atelectasis vertically on the left side due to the prominent multiloculated effusion. Right rib plates noted. IMPRESSION: 1. Large left pleural effusion likely with loculated components and substantial passive atelectasis. 2. Small right pleural effusion along the costophrenic angle and minor fissure. 3. Moderate enlargement of the cardiopericardial silhouette. 4. Right IJ central line tip: Cavoatrial junction. Right PICC line tip: Lower SVC. Electronically Signed   By: Gaylyn Rong M.D.   On: 10/02/2023  13:34      LOS: 13 days   Elmon Kirschner, NP [facility] Pager: [phone]  10/03/2023, 10:42 AM

## 2023-10-04 DIAGNOSIS — R579 Shock, unspecified: Secondary | ICD-10-CM | POA: Diagnosis not present

## 2023-10-04 LAB — RENAL FUNCTION PANEL
Albumin: 1.9 g/dL — ABNORMAL LOW (ref 3.5–5.0)
Anion gap: 16 — ABNORMAL HIGH (ref 5–15)
BUN: 74 mg/dL — ABNORMAL HIGH (ref 8–23)
CO2: 24 mmol/L (ref 22–32)
Calcium: 8 mg/dL — ABNORMAL LOW (ref 8.9–10.3)
Chloride: 94 mmol/L — ABNORMAL LOW (ref 98–111)
Creatinine, Ser: 6.85 mg/dL — ABNORMAL HIGH (ref 0.61–1.24)
GFR, Estimated: 7 mL/min — ABNORMAL LOW (ref >60)
Glucose, Bld: 125 mg/dL — ABNORMAL HIGH (ref 70–99)
Phosphorus: 9 mg/dL — ABNORMAL HIGH (ref 2.5–4.6)
Potassium: 4.7 mmol/L (ref 3.5–5.1)
Sodium: 134 mmol/L — ABNORMAL LOW (ref 135–145)

## 2023-10-04 LAB — MAGNESIUM: Magnesium: 2.4 mg/dL (ref 1.7–2.4)

## 2023-10-06 LAB — CULTURE, BLOOD (ROUTINE X 2)
Culture: NO GROWTH
Culture: NO GROWTH
Special Requests: ADEQUATE
Special Requests: ADEQUATE

## 2023-10-31 NOTE — Progress Notes (Signed)
Telemetry called and said that heart rate dropped to 27.  Ran into the room and patient's heart rate dropped to 0.  No respirations and skin was ashen.  Notified Press photographer and Timothy Mata.  Also notified Dr. Benjamine Mola.

## 2023-10-31 NOTE — Death Summary Note (Signed)
EXAM: CT ABDOMEN AND PELVIS WITHOUT CONTRAST TECHNIQUE: Multidetector CT imaging of the abdomen and pelvis was performed following the standard protocol without IV contrast. RADIATION DOSE REDUCTION: This exam was performed according to the departmental dose-optimization program which includes automated exposure control, adjustment of the mA and/or kV according to patient size and/or use of iterative reconstruction technique. COMPARISON:  CT scan abdomen and pelvis report from 08/03/2013. Please note, images are not available for review. FINDINGS: Lower chest: The lung bases are clear. There are bilateral small pleural effusions. The heart is normal in size. No pericardial effusion. Hepatobiliary: The liver is normal in size. Non-cirrhotic configuration. No suspicious mass. No intrahepatic or extrahepatic bile duct dilation. No calcified gallstones. Normal gallbladder wall thickness. No pericholecystic inflammatory changes. Pancreas: Unremarkable. No pancreatic ductal dilatation or surrounding inflammatory changes. Spleen: Within normal limits. No focal lesion. Adrenals/Urinary Tract: Adrenal glands are unremarkable. No suspicious renal mass within the limitations of this unenhanced exam. Bilateral kidneys exhibit mild diffuse cortical atrophy. Bilateral extrarenal pelvis noted. There is mild left hydronephrosis and entire hydroureter without discrete obstructing mass or ureterolithiasis. Findings are likely secondary to chronic vesicoureteric reflux.  No right hydroureteronephrosis. Diffuse trabeculations and several wide necked bladder diverticula noted, suggesting sequela of chronic urinary outflow obstruction. Stomach/Bowel: No disproportionate dilation of the small or large bowel loops. No evidence of abnormal bowel wall thickening or inflammatory changes. The appendix is unremarkable. Vascular/Lymphatic: No ascites or pneumoperitoneum. No abdominal or pelvic lymphadenopathy, by size criteria. No aneurysmal dilation of the major abdominal arteries. There are moderate peripheral atherosclerotic vascular calcifications of the aorta and its major branches. Reproductive: Enlarged prostate. Symmetric seminal vesicles. Other: There are fat containing umbilical and bilateral inguinal hernias. The soft tissues and abdominal wall are otherwise unremarkable. Musculoskeletal: No suspicious osseous lesions. Multiple old healed posteromedial fractures of right lower ribs as well as multiple rib ORIF noted. There are mild multilevel degenerative changes in the visualized spine. IMPRESSION: 1. No acute inflammatory process identified within the abdomen or pelvis. 2. Mild left hydroureteronephrosis without obstructing mass or ureterolithiasis. Findings are likely secondary to chronic vesicoureteric reflux. Enlarged prostate and bladder wall trabeculations/diverticula. 3. Bilateral small pleural effusions. 4. Multiple other nonacute observations, as described above. Electronically Signed   By: Jules Schick M.D.   On: 09/26/2023 16:24    Microbiology: Recent Results (from the past 240 hour(s))  Culture, blood (Routine X 2) w Reflex to ID Panel     Status: None (Preliminary result)   Collection Time: 10/01/23  9:05 AM   Specimen: BLOOD LEFT HAND  Result Value Ref Range Status   Specimen Description BLOOD LEFT HAND  Final   Special Requests   Final    BOTTLES DRAWN AEROBIC AND ANAEROBIC Blood Culture adequate volume   Culture   Final    NO GROWTH 3 DAYS Performed  at [facility], [address], [place], [place] [address]    Report Status PENDING  Incomplete  Culture, blood (Routine X 2) w Reflex to ID Panel     Status: None (Preliminary result)   Collection Time: 10/01/23  9:05 AM   Specimen: BLOOD LEFT HAND  Result Value Ref Range Status   Specimen Description BLOOD LEFT HAND  Final   Special Requests   Final    BOTTLES DRAWN AEROBIC AND ANAEROBIC Blood Culture adequate volume   Culture   Final    NO GROWTH 3 DAYS Performed at [facility], [address] [place], [place]  By: Lupita Raider M.D.   On: 10/02/2023 14:36   DG Chest Port 1 View  Result Date: 10/02/2023 CLINICAL DATA:  Sepsis EXAM: PORTABLE CHEST 1 VIEW COMPARISON:  09/30/2023 FINDINGS: Large left pleural effusion likely with loculated components. Small right pleural effusion along the costophrenic angle and minor fissure. Right IJ central line tip: Cavoatrial junction. Right PICC line tip: Lower SVC. Atheromatous vascular calcification of the thoracic aorta. Moderate enlargement of the cardiopericardial silhouette. Substantial passive atelectasis vertically on the left side due to the prominent multiloculated effusion. Right rib plates noted. IMPRESSION: 1. Large left pleural effusion likely with loculated components and substantial passive atelectasis. 2. Small right pleural effusion along the costophrenic angle and minor fissure. 3. Moderate enlargement of the cardiopericardial silhouette. 4. Right IJ central line tip: Cavoatrial junction. Right PICC line tip: Lower SVC. Electronically Signed   By: Gaylyn Rong M.D.   On: 10/02/2023 13:34   DG CHEST PORT 1 VIEW  Result Date: 09/30/2023 CLINICAL DATA:  Lymphocytosis, hemothorax EXAM: PORTABLE CHEST - 1 VIEW COMPARISON:  09/25/2023 FINDINGS: Right IJ central line placement to the mid right atrium. No pneumothorax. Stable right arm PICC. Increasing left pleural effusion surrounding and compressing the left lung particularly at the base. Heart size upper limits normal. Aortic Atherosclerosis (ICD10-170.0). Post fixation of right rib fractures. Blunting of right lateral costophrenic angle as before. IMPRESSION: 1. Increasing left pleural effusion. 2. Right IJ central line to mid right atrium. No pneumothorax. Electronically Signed   By: Corlis Leak M.D.   On: 09/30/2023 15:46   US PELVIS LIMITED (TRANSABDOMINAL ONLY)  Result Date: 09/30/2023 CLINICAL DATA:  Hematuria EXAM: LIMITED ULTRASOUND OF PELVIS TECHNIQUE: Limited  transabdominal ultrasound examination of the pelvis was performed. COMPARISON:  CT 09/05/2023 FINDINGS: Foley catheter in place. Bladder decompressed around the Foley catheter and difficult to visualize and evaluate. Complex hypoechoic material around the Foley catheter balloon may reflect blood products within the bladder lumen. IMPRESSION: Foley catheter in place with bladder decompressed and difficult to evaluate. Electronically Signed   By: Charlett Nose M.D.   On: 09/30/2023 01:51   IR Fluoro Guide CV Line Right  Result Date: 09/26/2023 INDICATION: ESRD requiring HD initiation. EXAM: NON-TUNNELED CENTRAL VENOUS HEMODIALYSIS CATHETER PLACEMENT WITH ULTRASOUND AND FLUOROSCOPIC GUIDANCE COMPARISON:  Chest XR, 09/25/2023. MEDICATIONS: Local anesthetic was administered. FLUOROSCOPY TIME:  Fluoroscopic dose; 1 mGy COMPLICATIONS: None immediate. PROCEDURE: Informed written consent was obtained from the patient and/or patient's representative after a discussion of the risks, benefits, and alternatives to treatment. Questions regarding the procedure were encouraged and answered. The RIGHT neck and chest were prepped with chlorhexidine in a sterile fashion, and a sterile drape was applied covering the operative field. Maximum barrier sterile technique with sterile gowns and gloves were used for the procedure. A timeout was performed prior to the initiation of the procedure. After the overlying soft tissues were anesthetized, a small venotomy incision was created and a micropuncture kit was utilized to access the internal jugular vein. Real-time ultrasound guidance was utilized for vascular access including the acquisition of a permanent ultrasound image documenting patency of the accessed vessel. The microwire was utilized to measure appropriate catheter length. A stiff glidewire was advanced to the level of the IVC. Under fluoroscopic guidance, the venotomy was serially dilated, ultimately allowing placement of a 20  cm temporary Trialysis catheter with tip ultimately terminating within the superior aspect of the right atrium. Final catheter positioning was confirmed and documented with a spot radiographic image.  By: Lupita Raider M.D.   On: 10/02/2023 14:36   DG Chest Port 1 View  Result Date: 10/02/2023 CLINICAL DATA:  Sepsis EXAM: PORTABLE CHEST 1 VIEW COMPARISON:  09/30/2023 FINDINGS: Large left pleural effusion likely with loculated components. Small right pleural effusion along the costophrenic angle and minor fissure. Right IJ central line tip: Cavoatrial junction. Right PICC line tip: Lower SVC. Atheromatous vascular calcification of the thoracic aorta. Moderate enlargement of the cardiopericardial silhouette. Substantial passive atelectasis vertically on the left side due to the prominent multiloculated effusion. Right rib plates noted. IMPRESSION: 1. Large left pleural effusion likely with loculated components and substantial passive atelectasis. 2. Small right pleural effusion along the costophrenic angle and minor fissure. 3. Moderate enlargement of the cardiopericardial silhouette. 4. Right IJ central line tip: Cavoatrial junction. Right PICC line tip: Lower SVC. Electronically Signed   By: Gaylyn Rong M.D.   On: 10/02/2023 13:34   DG CHEST PORT 1 VIEW  Result Date: 09/30/2023 CLINICAL DATA:  Lymphocytosis, hemothorax EXAM: PORTABLE CHEST - 1 VIEW COMPARISON:  09/25/2023 FINDINGS: Right IJ central line placement to the mid right atrium. No pneumothorax. Stable right arm PICC. Increasing left pleural effusion surrounding and compressing the left lung particularly at the base. Heart size upper limits normal. Aortic Atherosclerosis (ICD10-170.0). Post fixation of right rib fractures. Blunting of right lateral costophrenic angle as before. IMPRESSION: 1. Increasing left pleural effusion. 2. Right IJ central line to mid right atrium. No pneumothorax. Electronically Signed   By: Corlis Leak M.D.   On: 09/30/2023 15:46   US PELVIS LIMITED (TRANSABDOMINAL ONLY)  Result Date: 09/30/2023 CLINICAL DATA:  Hematuria EXAM: LIMITED ULTRASOUND OF PELVIS TECHNIQUE: Limited  transabdominal ultrasound examination of the pelvis was performed. COMPARISON:  CT 09/05/2023 FINDINGS: Foley catheter in place. Bladder decompressed around the Foley catheter and difficult to visualize and evaluate. Complex hypoechoic material around the Foley catheter balloon may reflect blood products within the bladder lumen. IMPRESSION: Foley catheter in place with bladder decompressed and difficult to evaluate. Electronically Signed   By: Charlett Nose M.D.   On: 09/30/2023 01:51   IR Fluoro Guide CV Line Right  Result Date: 09/26/2023 INDICATION: ESRD requiring HD initiation. EXAM: NON-TUNNELED CENTRAL VENOUS HEMODIALYSIS CATHETER PLACEMENT WITH ULTRASOUND AND FLUOROSCOPIC GUIDANCE COMPARISON:  Chest XR, 09/25/2023. MEDICATIONS: Local anesthetic was administered. FLUOROSCOPY TIME:  Fluoroscopic dose; 1 mGy COMPLICATIONS: None immediate. PROCEDURE: Informed written consent was obtained from the patient and/or patient's representative after a discussion of the risks, benefits, and alternatives to treatment. Questions regarding the procedure were encouraged and answered. The RIGHT neck and chest were prepped with chlorhexidine in a sterile fashion, and a sterile drape was applied covering the operative field. Maximum barrier sterile technique with sterile gowns and gloves were used for the procedure. A timeout was performed prior to the initiation of the procedure. After the overlying soft tissues were anesthetized, a small venotomy incision was created and a micropuncture kit was utilized to access the internal jugular vein. Real-time ultrasound guidance was utilized for vascular access including the acquisition of a permanent ultrasound image documenting patency of the accessed vessel. The microwire was utilized to measure appropriate catheter length. A stiff glidewire was advanced to the level of the IVC. Under fluoroscopic guidance, the venotomy was serially dilated, ultimately allowing placement of a 20  cm temporary Trialysis catheter with tip ultimately terminating within the superior aspect of the right atrium. Final catheter positioning was confirmed and documented with a spot radiographic image.  By: Lupita Raider M.D.   On: 10/02/2023 14:36   DG Chest Port 1 View  Result Date: 10/02/2023 CLINICAL DATA:  Sepsis EXAM: PORTABLE CHEST 1 VIEW COMPARISON:  09/30/2023 FINDINGS: Large left pleural effusion likely with loculated components. Small right pleural effusion along the costophrenic angle and minor fissure. Right IJ central line tip: Cavoatrial junction. Right PICC line tip: Lower SVC. Atheromatous vascular calcification of the thoracic aorta. Moderate enlargement of the cardiopericardial silhouette. Substantial passive atelectasis vertically on the left side due to the prominent multiloculated effusion. Right rib plates noted. IMPRESSION: 1. Large left pleural effusion likely with loculated components and substantial passive atelectasis. 2. Small right pleural effusion along the costophrenic angle and minor fissure. 3. Moderate enlargement of the cardiopericardial silhouette. 4. Right IJ central line tip: Cavoatrial junction. Right PICC line tip: Lower SVC. Electronically Signed   By: Gaylyn Rong M.D.   On: 10/02/2023 13:34   DG CHEST PORT 1 VIEW  Result Date: 09/30/2023 CLINICAL DATA:  Lymphocytosis, hemothorax EXAM: PORTABLE CHEST - 1 VIEW COMPARISON:  09/25/2023 FINDINGS: Right IJ central line placement to the mid right atrium. No pneumothorax. Stable right arm PICC. Increasing left pleural effusion surrounding and compressing the left lung particularly at the base. Heart size upper limits normal. Aortic Atherosclerosis (ICD10-170.0). Post fixation of right rib fractures. Blunting of right lateral costophrenic angle as before. IMPRESSION: 1. Increasing left pleural effusion. 2. Right IJ central line to mid right atrium. No pneumothorax. Electronically Signed   By: Corlis Leak M.D.   On: 09/30/2023 15:46   US PELVIS LIMITED (TRANSABDOMINAL ONLY)  Result Date: 09/30/2023 CLINICAL DATA:  Hematuria EXAM: LIMITED ULTRASOUND OF PELVIS TECHNIQUE: Limited  transabdominal ultrasound examination of the pelvis was performed. COMPARISON:  CT 09/05/2023 FINDINGS: Foley catheter in place. Bladder decompressed around the Foley catheter and difficult to visualize and evaluate. Complex hypoechoic material around the Foley catheter balloon may reflect blood products within the bladder lumen. IMPRESSION: Foley catheter in place with bladder decompressed and difficult to evaluate. Electronically Signed   By: Charlett Nose M.D.   On: 09/30/2023 01:51   IR Fluoro Guide CV Line Right  Result Date: 09/26/2023 INDICATION: ESRD requiring HD initiation. EXAM: NON-TUNNELED CENTRAL VENOUS HEMODIALYSIS CATHETER PLACEMENT WITH ULTRASOUND AND FLUOROSCOPIC GUIDANCE COMPARISON:  Chest XR, 09/25/2023. MEDICATIONS: Local anesthetic was administered. FLUOROSCOPY TIME:  Fluoroscopic dose; 1 mGy COMPLICATIONS: None immediate. PROCEDURE: Informed written consent was obtained from the patient and/or patient's representative after a discussion of the risks, benefits, and alternatives to treatment. Questions regarding the procedure were encouraged and answered. The RIGHT neck and chest were prepped with chlorhexidine in a sterile fashion, and a sterile drape was applied covering the operative field. Maximum barrier sterile technique with sterile gowns and gloves were used for the procedure. A timeout was performed prior to the initiation of the procedure. After the overlying soft tissues were anesthetized, a small venotomy incision was created and a micropuncture kit was utilized to access the internal jugular vein. Real-time ultrasound guidance was utilized for vascular access including the acquisition of a permanent ultrasound image documenting patency of the accessed vessel. The microwire was utilized to measure appropriate catheter length. A stiff glidewire was advanced to the level of the IVC. Under fluoroscopic guidance, the venotomy was serially dilated, ultimately allowing placement of a 20  cm temporary Trialysis catheter with tip ultimately terminating within the superior aspect of the right atrium. Final catheter positioning was confirmed and documented with a spot radiographic image.  By: Lupita Raider M.D.   On: 10/02/2023 14:36   DG Chest Port 1 View  Result Date: 10/02/2023 CLINICAL DATA:  Sepsis EXAM: PORTABLE CHEST 1 VIEW COMPARISON:  09/30/2023 FINDINGS: Large left pleural effusion likely with loculated components. Small right pleural effusion along the costophrenic angle and minor fissure. Right IJ central line tip: Cavoatrial junction. Right PICC line tip: Lower SVC. Atheromatous vascular calcification of the thoracic aorta. Moderate enlargement of the cardiopericardial silhouette. Substantial passive atelectasis vertically on the left side due to the prominent multiloculated effusion. Right rib plates noted. IMPRESSION: 1. Large left pleural effusion likely with loculated components and substantial passive atelectasis. 2. Small right pleural effusion along the costophrenic angle and minor fissure. 3. Moderate enlargement of the cardiopericardial silhouette. 4. Right IJ central line tip: Cavoatrial junction. Right PICC line tip: Lower SVC. Electronically Signed   By: Gaylyn Rong M.D.   On: 10/02/2023 13:34   DG CHEST PORT 1 VIEW  Result Date: 09/30/2023 CLINICAL DATA:  Lymphocytosis, hemothorax EXAM: PORTABLE CHEST - 1 VIEW COMPARISON:  09/25/2023 FINDINGS: Right IJ central line placement to the mid right atrium. No pneumothorax. Stable right arm PICC. Increasing left pleural effusion surrounding and compressing the left lung particularly at the base. Heart size upper limits normal. Aortic Atherosclerosis (ICD10-170.0). Post fixation of right rib fractures. Blunting of right lateral costophrenic angle as before. IMPRESSION: 1. Increasing left pleural effusion. 2. Right IJ central line to mid right atrium. No pneumothorax. Electronically Signed   By: Corlis Leak M.D.   On: 09/30/2023 15:46   US PELVIS LIMITED (TRANSABDOMINAL ONLY)  Result Date: 09/30/2023 CLINICAL DATA:  Hematuria EXAM: LIMITED ULTRASOUND OF PELVIS TECHNIQUE: Limited  transabdominal ultrasound examination of the pelvis was performed. COMPARISON:  CT 09/05/2023 FINDINGS: Foley catheter in place. Bladder decompressed around the Foley catheter and difficult to visualize and evaluate. Complex hypoechoic material around the Foley catheter balloon may reflect blood products within the bladder lumen. IMPRESSION: Foley catheter in place with bladder decompressed and difficult to evaluate. Electronically Signed   By: Charlett Nose M.D.   On: 09/30/2023 01:51   IR Fluoro Guide CV Line Right  Result Date: 09/26/2023 INDICATION: ESRD requiring HD initiation. EXAM: NON-TUNNELED CENTRAL VENOUS HEMODIALYSIS CATHETER PLACEMENT WITH ULTRASOUND AND FLUOROSCOPIC GUIDANCE COMPARISON:  Chest XR, 09/25/2023. MEDICATIONS: Local anesthetic was administered. FLUOROSCOPY TIME:  Fluoroscopic dose; 1 mGy COMPLICATIONS: None immediate. PROCEDURE: Informed written consent was obtained from the patient and/or patient's representative after a discussion of the risks, benefits, and alternatives to treatment. Questions regarding the procedure were encouraged and answered. The RIGHT neck and chest were prepped with chlorhexidine in a sterile fashion, and a sterile drape was applied covering the operative field. Maximum barrier sterile technique with sterile gowns and gloves were used for the procedure. A timeout was performed prior to the initiation of the procedure. After the overlying soft tissues were anesthetized, a small venotomy incision was created and a micropuncture kit was utilized to access the internal jugular vein. Real-time ultrasound guidance was utilized for vascular access including the acquisition of a permanent ultrasound image documenting patency of the accessed vessel. The microwire was utilized to measure appropriate catheter length. A stiff glidewire was advanced to the level of the IVC. Under fluoroscopic guidance, the venotomy was serially dilated, ultimately allowing placement of a 20  cm temporary Trialysis catheter with tip ultimately terminating within the superior aspect of the right atrium. Final catheter positioning was confirmed and documented with a spot radiographic image.  EXAM: CT ABDOMEN AND PELVIS WITHOUT CONTRAST TECHNIQUE: Multidetector CT imaging of the abdomen and pelvis was performed following the standard protocol without IV contrast. RADIATION DOSE REDUCTION: This exam was performed according to the departmental dose-optimization program which includes automated exposure control, adjustment of the mA and/or kV according to patient size and/or use of iterative reconstruction technique. COMPARISON:  CT scan abdomen and pelvis report from 08/03/2013. Please note, images are not available for review. FINDINGS: Lower chest: The lung bases are clear. There are bilateral small pleural effusions. The heart is normal in size. No pericardial effusion. Hepatobiliary: The liver is normal in size. Non-cirrhotic configuration. No suspicious mass. No intrahepatic or extrahepatic bile duct dilation. No calcified gallstones. Normal gallbladder wall thickness. No pericholecystic inflammatory changes. Pancreas: Unremarkable. No pancreatic ductal dilatation or surrounding inflammatory changes. Spleen: Within normal limits. No focal lesion. Adrenals/Urinary Tract: Adrenal glands are unremarkable. No suspicious renal mass within the limitations of this unenhanced exam. Bilateral kidneys exhibit mild diffuse cortical atrophy. Bilateral extrarenal pelvis noted. There is mild left hydronephrosis and entire hydroureter without discrete obstructing mass or ureterolithiasis. Findings are likely secondary to chronic vesicoureteric reflux.  No right hydroureteronephrosis. Diffuse trabeculations and several wide necked bladder diverticula noted, suggesting sequela of chronic urinary outflow obstruction. Stomach/Bowel: No disproportionate dilation of the small or large bowel loops. No evidence of abnormal bowel wall thickening or inflammatory changes. The appendix is unremarkable. Vascular/Lymphatic: No ascites or pneumoperitoneum. No abdominal or pelvic lymphadenopathy, by size criteria. No aneurysmal dilation of the major abdominal arteries. There are moderate peripheral atherosclerotic vascular calcifications of the aorta and its major branches. Reproductive: Enlarged prostate. Symmetric seminal vesicles. Other: There are fat containing umbilical and bilateral inguinal hernias. The soft tissues and abdominal wall are otherwise unremarkable. Musculoskeletal: No suspicious osseous lesions. Multiple old healed posteromedial fractures of right lower ribs as well as multiple rib ORIF noted. There are mild multilevel degenerative changes in the visualized spine. IMPRESSION: 1. No acute inflammatory process identified within the abdomen or pelvis. 2. Mild left hydroureteronephrosis without obstructing mass or ureterolithiasis. Findings are likely secondary to chronic vesicoureteric reflux. Enlarged prostate and bladder wall trabeculations/diverticula. 3. Bilateral small pleural effusions. 4. Multiple other nonacute observations, as described above. Electronically Signed   By: Jules Schick M.D.   On: 09/26/2023 16:24    Microbiology: Recent Results (from the past 240 hour(s))  Culture, blood (Routine X 2) w Reflex to ID Panel     Status: None (Preliminary result)   Collection Time: 10/01/23  9:05 AM   Specimen: BLOOD LEFT HAND  Result Value Ref Range Status   Specimen Description BLOOD LEFT HAND  Final   Special Requests   Final    BOTTLES DRAWN AEROBIC AND ANAEROBIC Blood Culture adequate volume   Culture   Final    NO GROWTH 3 DAYS Performed  at [facility], [address], [place], [place] [address]    Report Status PENDING  Incomplete  Culture, blood (Routine X 2) w Reflex to ID Panel     Status: None (Preliminary result)   Collection Time: 10/01/23  9:05 AM   Specimen: BLOOD LEFT HAND  Result Value Ref Range Status   Specimen Description BLOOD LEFT HAND  Final   Special Requests   Final    BOTTLES DRAWN AEROBIC AND ANAEROBIC Blood Culture adequate volume   Culture   Final    NO GROWTH 3 DAYS Performed at [facility], [address] [place], [place]  EXAM: CT ABDOMEN AND PELVIS WITHOUT CONTRAST TECHNIQUE: Multidetector CT imaging of the abdomen and pelvis was performed following the standard protocol without IV contrast. RADIATION DOSE REDUCTION: This exam was performed according to the departmental dose-optimization program which includes automated exposure control, adjustment of the mA and/or kV according to patient size and/or use of iterative reconstruction technique. COMPARISON:  CT scan abdomen and pelvis report from 08/03/2013. Please note, images are not available for review. FINDINGS: Lower chest: The lung bases are clear. There are bilateral small pleural effusions. The heart is normal in size. No pericardial effusion. Hepatobiliary: The liver is normal in size. Non-cirrhotic configuration. No suspicious mass. No intrahepatic or extrahepatic bile duct dilation. No calcified gallstones. Normal gallbladder wall thickness. No pericholecystic inflammatory changes. Pancreas: Unremarkable. No pancreatic ductal dilatation or surrounding inflammatory changes. Spleen: Within normal limits. No focal lesion. Adrenals/Urinary Tract: Adrenal glands are unremarkable. No suspicious renal mass within the limitations of this unenhanced exam. Bilateral kidneys exhibit mild diffuse cortical atrophy. Bilateral extrarenal pelvis noted. There is mild left hydronephrosis and entire hydroureter without discrete obstructing mass or ureterolithiasis. Findings are likely secondary to chronic vesicoureteric reflux.  No right hydroureteronephrosis. Diffuse trabeculations and several wide necked bladder diverticula noted, suggesting sequela of chronic urinary outflow obstruction. Stomach/Bowel: No disproportionate dilation of the small or large bowel loops. No evidence of abnormal bowel wall thickening or inflammatory changes. The appendix is unremarkable. Vascular/Lymphatic: No ascites or pneumoperitoneum. No abdominal or pelvic lymphadenopathy, by size criteria. No aneurysmal dilation of the major abdominal arteries. There are moderate peripheral atherosclerotic vascular calcifications of the aorta and its major branches. Reproductive: Enlarged prostate. Symmetric seminal vesicles. Other: There are fat containing umbilical and bilateral inguinal hernias. The soft tissues and abdominal wall are otherwise unremarkable. Musculoskeletal: No suspicious osseous lesions. Multiple old healed posteromedial fractures of right lower ribs as well as multiple rib ORIF noted. There are mild multilevel degenerative changes in the visualized spine. IMPRESSION: 1. No acute inflammatory process identified within the abdomen or pelvis. 2. Mild left hydroureteronephrosis without obstructing mass or ureterolithiasis. Findings are likely secondary to chronic vesicoureteric reflux. Enlarged prostate and bladder wall trabeculations/diverticula. 3. Bilateral small pleural effusions. 4. Multiple other nonacute observations, as described above. Electronically Signed   By: Jules Schick M.D.   On: 09/26/2023 16:24    Microbiology: Recent Results (from the past 240 hour(s))  Culture, blood (Routine X 2) w Reflex to ID Panel     Status: None (Preliminary result)   Collection Time: 10/01/23  9:05 AM   Specimen: BLOOD LEFT HAND  Result Value Ref Range Status   Specimen Description BLOOD LEFT HAND  Final   Special Requests   Final    BOTTLES DRAWN AEROBIC AND ANAEROBIC Blood Culture adequate volume   Culture   Final    NO GROWTH 3 DAYS Performed  at [facility], [address], [place], [place] [address]    Report Status PENDING  Incomplete  Culture, blood (Routine X 2) w Reflex to ID Panel     Status: None (Preliminary result)   Collection Time: 10/01/23  9:05 AM   Specimen: BLOOD LEFT HAND  Result Value Ref Range Status   Specimen Description BLOOD LEFT HAND  Final   Special Requests   Final    BOTTLES DRAWN AEROBIC AND ANAEROBIC Blood Culture adequate volume   Culture   Final    NO GROWTH 3 DAYS Performed at [facility], [address] [place], [place]  EXAM: CT ABDOMEN AND PELVIS WITHOUT CONTRAST TECHNIQUE: Multidetector CT imaging of the abdomen and pelvis was performed following the standard protocol without IV contrast. RADIATION DOSE REDUCTION: This exam was performed according to the departmental dose-optimization program which includes automated exposure control, adjustment of the mA and/or kV according to patient size and/or use of iterative reconstruction technique. COMPARISON:  CT scan abdomen and pelvis report from 08/03/2013. Please note, images are not available for review. FINDINGS: Lower chest: The lung bases are clear. There are bilateral small pleural effusions. The heart is normal in size. No pericardial effusion. Hepatobiliary: The liver is normal in size. Non-cirrhotic configuration. No suspicious mass. No intrahepatic or extrahepatic bile duct dilation. No calcified gallstones. Normal gallbladder wall thickness. No pericholecystic inflammatory changes. Pancreas: Unremarkable. No pancreatic ductal dilatation or surrounding inflammatory changes. Spleen: Within normal limits. No focal lesion. Adrenals/Urinary Tract: Adrenal glands are unremarkable. No suspicious renal mass within the limitations of this unenhanced exam. Bilateral kidneys exhibit mild diffuse cortical atrophy. Bilateral extrarenal pelvis noted. There is mild left hydronephrosis and entire hydroureter without discrete obstructing mass or ureterolithiasis. Findings are likely secondary to chronic vesicoureteric reflux.  No right hydroureteronephrosis. Diffuse trabeculations and several wide necked bladder diverticula noted, suggesting sequela of chronic urinary outflow obstruction. Stomach/Bowel: No disproportionate dilation of the small or large bowel loops. No evidence of abnormal bowel wall thickening or inflammatory changes. The appendix is unremarkable. Vascular/Lymphatic: No ascites or pneumoperitoneum. No abdominal or pelvic lymphadenopathy, by size criteria. No aneurysmal dilation of the major abdominal arteries. There are moderate peripheral atherosclerotic vascular calcifications of the aorta and its major branches. Reproductive: Enlarged prostate. Symmetric seminal vesicles. Other: There are fat containing umbilical and bilateral inguinal hernias. The soft tissues and abdominal wall are otherwise unremarkable. Musculoskeletal: No suspicious osseous lesions. Multiple old healed posteromedial fractures of right lower ribs as well as multiple rib ORIF noted. There are mild multilevel degenerative changes in the visualized spine. IMPRESSION: 1. No acute inflammatory process identified within the abdomen or pelvis. 2. Mild left hydroureteronephrosis without obstructing mass or ureterolithiasis. Findings are likely secondary to chronic vesicoureteric reflux. Enlarged prostate and bladder wall trabeculations/diverticula. 3. Bilateral small pleural effusions. 4. Multiple other nonacute observations, as described above. Electronically Signed   By: Jules Schick M.D.   On: 09/26/2023 16:24    Microbiology: Recent Results (from the past 240 hour(s))  Culture, blood (Routine X 2) w Reflex to ID Panel     Status: None (Preliminary result)   Collection Time: 10/01/23  9:05 AM   Specimen: BLOOD LEFT HAND  Result Value Ref Range Status   Specimen Description BLOOD LEFT HAND  Final   Special Requests   Final    BOTTLES DRAWN AEROBIC AND ANAEROBIC Blood Culture adequate volume   Culture   Final    NO GROWTH 3 DAYS Performed  at [facility], [address], [place], [place] [address]    Report Status PENDING  Incomplete  Culture, blood (Routine X 2) w Reflex to ID Panel     Status: None (Preliminary result)   Collection Time: 10/01/23  9:05 AM   Specimen: BLOOD LEFT HAND  Result Value Ref Range Status   Specimen Description BLOOD LEFT HAND  Final   Special Requests   Final    BOTTLES DRAWN AEROBIC AND ANAEROBIC Blood Culture adequate volume   Culture   Final    NO GROWTH 3 DAYS Performed at [facility], [address] [place], [place]  By: Lupita Raider M.D.   On: 10/02/2023 14:36   DG Chest Port 1 View  Result Date: 10/02/2023 CLINICAL DATA:  Sepsis EXAM: PORTABLE CHEST 1 VIEW COMPARISON:  09/30/2023 FINDINGS: Large left pleural effusion likely with loculated components. Small right pleural effusion along the costophrenic angle and minor fissure. Right IJ central line tip: Cavoatrial junction. Right PICC line tip: Lower SVC. Atheromatous vascular calcification of the thoracic aorta. Moderate enlargement of the cardiopericardial silhouette. Substantial passive atelectasis vertically on the left side due to the prominent multiloculated effusion. Right rib plates noted. IMPRESSION: 1. Large left pleural effusion likely with loculated components and substantial passive atelectasis. 2. Small right pleural effusion along the costophrenic angle and minor fissure. 3. Moderate enlargement of the cardiopericardial silhouette. 4. Right IJ central line tip: Cavoatrial junction. Right PICC line tip: Lower SVC. Electronically Signed   By: Gaylyn Rong M.D.   On: 10/02/2023 13:34   DG CHEST PORT 1 VIEW  Result Date: 09/30/2023 CLINICAL DATA:  Lymphocytosis, hemothorax EXAM: PORTABLE CHEST - 1 VIEW COMPARISON:  09/25/2023 FINDINGS: Right IJ central line placement to the mid right atrium. No pneumothorax. Stable right arm PICC. Increasing left pleural effusion surrounding and compressing the left lung particularly at the base. Heart size upper limits normal. Aortic Atherosclerosis (ICD10-170.0). Post fixation of right rib fractures. Blunting of right lateral costophrenic angle as before. IMPRESSION: 1. Increasing left pleural effusion. 2. Right IJ central line to mid right atrium. No pneumothorax. Electronically Signed   By: Corlis Leak M.D.   On: 09/30/2023 15:46   US PELVIS LIMITED (TRANSABDOMINAL ONLY)  Result Date: 09/30/2023 CLINICAL DATA:  Hematuria EXAM: LIMITED ULTRASOUND OF PELVIS TECHNIQUE: Limited  transabdominal ultrasound examination of the pelvis was performed. COMPARISON:  CT 09/05/2023 FINDINGS: Foley catheter in place. Bladder decompressed around the Foley catheter and difficult to visualize and evaluate. Complex hypoechoic material around the Foley catheter balloon may reflect blood products within the bladder lumen. IMPRESSION: Foley catheter in place with bladder decompressed and difficult to evaluate. Electronically Signed   By: Charlett Nose M.D.   On: 09/30/2023 01:51   IR Fluoro Guide CV Line Right  Result Date: 09/26/2023 INDICATION: ESRD requiring HD initiation. EXAM: NON-TUNNELED CENTRAL VENOUS HEMODIALYSIS CATHETER PLACEMENT WITH ULTRASOUND AND FLUOROSCOPIC GUIDANCE COMPARISON:  Chest XR, 09/25/2023. MEDICATIONS: Local anesthetic was administered. FLUOROSCOPY TIME:  Fluoroscopic dose; 1 mGy COMPLICATIONS: None immediate. PROCEDURE: Informed written consent was obtained from the patient and/or patient's representative after a discussion of the risks, benefits, and alternatives to treatment. Questions regarding the procedure were encouraged and answered. The RIGHT neck and chest were prepped with chlorhexidine in a sterile fashion, and a sterile drape was applied covering the operative field. Maximum barrier sterile technique with sterile gowns and gloves were used for the procedure. A timeout was performed prior to the initiation of the procedure. After the overlying soft tissues were anesthetized, a small venotomy incision was created and a micropuncture kit was utilized to access the internal jugular vein. Real-time ultrasound guidance was utilized for vascular access including the acquisition of a permanent ultrasound image documenting patency of the accessed vessel. The microwire was utilized to measure appropriate catheter length. A stiff glidewire was advanced to the level of the IVC. Under fluoroscopic guidance, the venotomy was serially dilated, ultimately allowing placement of a 20  cm temporary Trialysis catheter with tip ultimately terminating within the superior aspect of the right atrium. Final catheter positioning was confirmed and documented with a spot radiographic image.  By: Lupita Raider M.D.   On: 10/02/2023 14:36   DG Chest Port 1 View  Result Date: 10/02/2023 CLINICAL DATA:  Sepsis EXAM: PORTABLE CHEST 1 VIEW COMPARISON:  09/30/2023 FINDINGS: Large left pleural effusion likely with loculated components. Small right pleural effusion along the costophrenic angle and minor fissure. Right IJ central line tip: Cavoatrial junction. Right PICC line tip: Lower SVC. Atheromatous vascular calcification of the thoracic aorta. Moderate enlargement of the cardiopericardial silhouette. Substantial passive atelectasis vertically on the left side due to the prominent multiloculated effusion. Right rib plates noted. IMPRESSION: 1. Large left pleural effusion likely with loculated components and substantial passive atelectasis. 2. Small right pleural effusion along the costophrenic angle and minor fissure. 3. Moderate enlargement of the cardiopericardial silhouette. 4. Right IJ central line tip: Cavoatrial junction. Right PICC line tip: Lower SVC. Electronically Signed   By: Gaylyn Rong M.D.   On: 10/02/2023 13:34   DG CHEST PORT 1 VIEW  Result Date: 09/30/2023 CLINICAL DATA:  Lymphocytosis, hemothorax EXAM: PORTABLE CHEST - 1 VIEW COMPARISON:  09/25/2023 FINDINGS: Right IJ central line placement to the mid right atrium. No pneumothorax. Stable right arm PICC. Increasing left pleural effusion surrounding and compressing the left lung particularly at the base. Heart size upper limits normal. Aortic Atherosclerosis (ICD10-170.0). Post fixation of right rib fractures. Blunting of right lateral costophrenic angle as before. IMPRESSION: 1. Increasing left pleural effusion. 2. Right IJ central line to mid right atrium. No pneumothorax. Electronically Signed   By: Corlis Leak M.D.   On: 09/30/2023 15:46   US PELVIS LIMITED (TRANSABDOMINAL ONLY)  Result Date: 09/30/2023 CLINICAL DATA:  Hematuria EXAM: LIMITED ULTRASOUND OF PELVIS TECHNIQUE: Limited  transabdominal ultrasound examination of the pelvis was performed. COMPARISON:  CT 09/05/2023 FINDINGS: Foley catheter in place. Bladder decompressed around the Foley catheter and difficult to visualize and evaluate. Complex hypoechoic material around the Foley catheter balloon may reflect blood products within the bladder lumen. IMPRESSION: Foley catheter in place with bladder decompressed and difficult to evaluate. Electronically Signed   By: Charlett Nose M.D.   On: 09/30/2023 01:51   IR Fluoro Guide CV Line Right  Result Date: 09/26/2023 INDICATION: ESRD requiring HD initiation. EXAM: NON-TUNNELED CENTRAL VENOUS HEMODIALYSIS CATHETER PLACEMENT WITH ULTRASOUND AND FLUOROSCOPIC GUIDANCE COMPARISON:  Chest XR, 09/25/2023. MEDICATIONS: Local anesthetic was administered. FLUOROSCOPY TIME:  Fluoroscopic dose; 1 mGy COMPLICATIONS: None immediate. PROCEDURE: Informed written consent was obtained from the patient and/or patient's representative after a discussion of the risks, benefits, and alternatives to treatment. Questions regarding the procedure were encouraged and answered. The RIGHT neck and chest were prepped with chlorhexidine in a sterile fashion, and a sterile drape was applied covering the operative field. Maximum barrier sterile technique with sterile gowns and gloves were used for the procedure. A timeout was performed prior to the initiation of the procedure. After the overlying soft tissues were anesthetized, a small venotomy incision was created and a micropuncture kit was utilized to access the internal jugular vein. Real-time ultrasound guidance was utilized for vascular access including the acquisition of a permanent ultrasound image documenting patency of the accessed vessel. The microwire was utilized to measure appropriate catheter length. A stiff glidewire was advanced to the level of the IVC. Under fluoroscopic guidance, the venotomy was serially dilated, ultimately allowing placement of a 20  cm temporary Trialysis catheter with tip ultimately terminating within the superior aspect of the right atrium. Final catheter positioning was confirmed and documented with a spot radiographic image.  EXAM: CT ABDOMEN AND PELVIS WITHOUT CONTRAST TECHNIQUE: Multidetector CT imaging of the abdomen and pelvis was performed following the standard protocol without IV contrast. RADIATION DOSE REDUCTION: This exam was performed according to the departmental dose-optimization program which includes automated exposure control, adjustment of the mA and/or kV according to patient size and/or use of iterative reconstruction technique. COMPARISON:  CT scan abdomen and pelvis report from 08/03/2013. Please note, images are not available for review. FINDINGS: Lower chest: The lung bases are clear. There are bilateral small pleural effusions. The heart is normal in size. No pericardial effusion. Hepatobiliary: The liver is normal in size. Non-cirrhotic configuration. No suspicious mass. No intrahepatic or extrahepatic bile duct dilation. No calcified gallstones. Normal gallbladder wall thickness. No pericholecystic inflammatory changes. Pancreas: Unremarkable. No pancreatic ductal dilatation or surrounding inflammatory changes. Spleen: Within normal limits. No focal lesion. Adrenals/Urinary Tract: Adrenal glands are unremarkable. No suspicious renal mass within the limitations of this unenhanced exam. Bilateral kidneys exhibit mild diffuse cortical atrophy. Bilateral extrarenal pelvis noted. There is mild left hydronephrosis and entire hydroureter without discrete obstructing mass or ureterolithiasis. Findings are likely secondary to chronic vesicoureteric reflux.  No right hydroureteronephrosis. Diffuse trabeculations and several wide necked bladder diverticula noted, suggesting sequela of chronic urinary outflow obstruction. Stomach/Bowel: No disproportionate dilation of the small or large bowel loops. No evidence of abnormal bowel wall thickening or inflammatory changes. The appendix is unremarkable. Vascular/Lymphatic: No ascites or pneumoperitoneum. No abdominal or pelvic lymphadenopathy, by size criteria. No aneurysmal dilation of the major abdominal arteries. There are moderate peripheral atherosclerotic vascular calcifications of the aorta and its major branches. Reproductive: Enlarged prostate. Symmetric seminal vesicles. Other: There are fat containing umbilical and bilateral inguinal hernias. The soft tissues and abdominal wall are otherwise unremarkable. Musculoskeletal: No suspicious osseous lesions. Multiple old healed posteromedial fractures of right lower ribs as well as multiple rib ORIF noted. There are mild multilevel degenerative changes in the visualized spine. IMPRESSION: 1. No acute inflammatory process identified within the abdomen or pelvis. 2. Mild left hydroureteronephrosis without obstructing mass or ureterolithiasis. Findings are likely secondary to chronic vesicoureteric reflux. Enlarged prostate and bladder wall trabeculations/diverticula. 3. Bilateral small pleural effusions. 4. Multiple other nonacute observations, as described above. Electronically Signed   By: Jules Schick M.D.   On: 09/26/2023 16:24    Microbiology: Recent Results (from the past 240 hour(s))  Culture, blood (Routine X 2) w Reflex to ID Panel     Status: None (Preliminary result)   Collection Time: 10/01/23  9:05 AM   Specimen: BLOOD LEFT HAND  Result Value Ref Range Status   Specimen Description BLOOD LEFT HAND  Final   Special Requests   Final    BOTTLES DRAWN AEROBIC AND ANAEROBIC Blood Culture adequate volume   Culture   Final    NO GROWTH 3 DAYS Performed  at [facility], [address], [place], [place] [address]    Report Status PENDING  Incomplete  Culture, blood (Routine X 2) w Reflex to ID Panel     Status: None (Preliminary result)   Collection Time: 10/01/23  9:05 AM   Specimen: BLOOD LEFT HAND  Result Value Ref Range Status   Specimen Description BLOOD LEFT HAND  Final   Special Requests   Final    BOTTLES DRAWN AEROBIC AND ANAEROBIC Blood Culture adequate volume   Culture   Final    NO GROWTH 3 DAYS Performed at [facility], [address] [place], [place]  EXAM: CT ABDOMEN AND PELVIS WITHOUT CONTRAST TECHNIQUE: Multidetector CT imaging of the abdomen and pelvis was performed following the standard protocol without IV contrast. RADIATION DOSE REDUCTION: This exam was performed according to the departmental dose-optimization program which includes automated exposure control, adjustment of the mA and/or kV according to patient size and/or use of iterative reconstruction technique. COMPARISON:  CT scan abdomen and pelvis report from 08/03/2013. Please note, images are not available for review. FINDINGS: Lower chest: The lung bases are clear. There are bilateral small pleural effusions. The heart is normal in size. No pericardial effusion. Hepatobiliary: The liver is normal in size. Non-cirrhotic configuration. No suspicious mass. No intrahepatic or extrahepatic bile duct dilation. No calcified gallstones. Normal gallbladder wall thickness. No pericholecystic inflammatory changes. Pancreas: Unremarkable. No pancreatic ductal dilatation or surrounding inflammatory changes. Spleen: Within normal limits. No focal lesion. Adrenals/Urinary Tract: Adrenal glands are unremarkable. No suspicious renal mass within the limitations of this unenhanced exam. Bilateral kidneys exhibit mild diffuse cortical atrophy. Bilateral extrarenal pelvis noted. There is mild left hydronephrosis and entire hydroureter without discrete obstructing mass or ureterolithiasis. Findings are likely secondary to chronic vesicoureteric reflux.  No right hydroureteronephrosis. Diffuse trabeculations and several wide necked bladder diverticula noted, suggesting sequela of chronic urinary outflow obstruction. Stomach/Bowel: No disproportionate dilation of the small or large bowel loops. No evidence of abnormal bowel wall thickening or inflammatory changes. The appendix is unremarkable. Vascular/Lymphatic: No ascites or pneumoperitoneum. No abdominal or pelvic lymphadenopathy, by size criteria. No aneurysmal dilation of the major abdominal arteries. There are moderate peripheral atherosclerotic vascular calcifications of the aorta and its major branches. Reproductive: Enlarged prostate. Symmetric seminal vesicles. Other: There are fat containing umbilical and bilateral inguinal hernias. The soft tissues and abdominal wall are otherwise unremarkable. Musculoskeletal: No suspicious osseous lesions. Multiple old healed posteromedial fractures of right lower ribs as well as multiple rib ORIF noted. There are mild multilevel degenerative changes in the visualized spine. IMPRESSION: 1. No acute inflammatory process identified within the abdomen or pelvis. 2. Mild left hydroureteronephrosis without obstructing mass or ureterolithiasis. Findings are likely secondary to chronic vesicoureteric reflux. Enlarged prostate and bladder wall trabeculations/diverticula. 3. Bilateral small pleural effusions. 4. Multiple other nonacute observations, as described above. Electronically Signed   By: Jules Schick M.D.   On: 09/26/2023 16:24    Microbiology: Recent Results (from the past 240 hour(s))  Culture, blood (Routine X 2) w Reflex to ID Panel     Status: None (Preliminary result)   Collection Time: 10/01/23  9:05 AM   Specimen: BLOOD LEFT HAND  Result Value Ref Range Status   Specimen Description BLOOD LEFT HAND  Final   Special Requests   Final    BOTTLES DRAWN AEROBIC AND ANAEROBIC Blood Culture adequate volume   Culture   Final    NO GROWTH 3 DAYS Performed  at [facility], [address], [place], [place] [address]    Report Status PENDING  Incomplete  Culture, blood (Routine X 2) w Reflex to ID Panel     Status: None (Preliminary result)   Collection Time: 10/01/23  9:05 AM   Specimen: BLOOD LEFT HAND  Result Value Ref Range Status   Specimen Description BLOOD LEFT HAND  Final   Special Requests   Final    BOTTLES DRAWN AEROBIC AND ANAEROBIC Blood Culture adequate volume   Culture   Final    NO GROWTH 3 DAYS Performed at [facility], [address] [place], [place]

## 2023-10-31 NOTE — TOC Progression Note (Signed)
Transition of Care [facility]) - Progression Note    Patient Details  Name: Timothy Mata MRN: [id number] Date of Birth: Nov 28, 1933  [facility] CM/SW Contact  Ronny Bacon, RN Phone Number: Oct 15, 2023, 9:31 AM  Clinical Narrative:   Message received from provider regarding patient home hospice set up. Spoke with patient at bedside, informed patient that having home hospice will cancel home health therapy services. Patient in agreement to do home hospice instead of home health therapy. Patient gave verbal permission to speak with wife about the arrangements. Call made to Mrs. Downie, she reports that she has not heard from any hospice facility and wants to use [facility]. Call made to [facility] [phone], spoke with Wilkie Aye a triage nurse. She did not see a referral in for patient. Faxed H&P and demographics to [facility] Int[facility] at [phone].    Expected Discharge Plan: Home w Home Health Services Barriers to Discharge: Continued Medical Work up  Expected Discharge Plan and Services   Discharge Planning Services: CM Consult Post Acute Care Choice: Home Health Living arrangements for the past 2 months: Single Family Home                 DME Arranged: N/A DME Agency: NA       HH Arranged: PT, OT, RN, Disease Management HH Agency: [facility] Date HH Agency Contacted: 10/03/23 Time HH Agency Contacted: 1330 Representative spoke with at [facility] Agency: Elnita Maxwell   Social Determinants of Health (SDOH) Interventions SDOH Screenings   Food Insecurity: Patient Unable To Answer (09/21/2023)  Housing: Patient Unable To Answer (09/21/2023)  Transportation Needs: Patient Unable To Answer (09/21/2023)  Utilities: Patient Unable To Answer (09/21/2023)  Tobacco Use: Medium Risk (09/27/2023)    Readmission Risk Interventions     No data to display

## 2023-10-31 NOTE — Progress Notes (Signed)
October 31, 2023 1122  Attending Physican Contact  Attending Physician Notified Y  Attending Physician (First and Last Name) Timothy Mata  Post Mortem Checklist  Date of Death 31-Oct-2023  Time of Death 1051  Pronounced By Timothy Mata, BSN, RN, CCRn and  Next of kin notified Yes  Name of next of kin notified of death Timothy Mata (and niece Timothy Mata [phone])  Contact Person's Relationship to Patient Spouse  Contact Person's Phone Number [phone] (also niece Timothy Mata [phone])  Contact Person's address [address] [place], [place], [address]  Family Communication Notes Spoke to spouse and niece numerous times.  Gave number to both to call with questions, if needed.  Was the patient a No Code Blue or a Limited Code Blue? Yes  Patient restrained (physical/manual hold/chemical)? Not applicable  Height 5\' 10"  (1.778 m)  Weight 116.7 kg  Body preparation complete Y  [employer] (previously known as Administrator, sports)  Notification Date October 31, 2023  Notification Time 1125  [employer] Number [phone]  Is patient a potential donor? N  Autopsy  Autopsy requested by MD or Family ( Non ME Case) N/A  Patient and Hospital Property Returned  Patient is satisfied that all belongings have been returned? Yes  Name of person receiving valuables?  (Niece said belongings taken.)  Dead on Arrival (Emergency Department)  Patient dead on arrival?  (N/A)  Medical Examiner  Is this a medical examiner's case? [facility] home name/address/phone # Timothy Mata [phone] (Niece Timothy Mata [phone] to said she would call back with the information.)  Planned location of pickup [facility] Timothy Mata, owner)

## 2023-10-31 NOTE — Progress Notes (Signed)
BP: (!) 122/53 (!) 113/59 112/79 123/73  Pulse: 68 (!) 129 79 92  Resp: 20 20 19  (!) 22  Temp: 98.7 F (37.1 C) 98 F (36.7 C) 98.3 F (36.8 C)   TempSrc: Oral Oral Axillary   SpO2: 97% 95% 96% 96%  Weight:      Height:        Intake/Output Summary (Last 24 hours) at 10/20/2023 1011 Last data filed at 10/19/2023 0251 Gross per 24 hour  Intake 278.36 ml  Output 145 ml  Net 133.36 ml   Filed Weights    10/02/23 1235 10/03/23 0336 10/18/2023 0500  Weight: 110 kg 110.9 kg 116.7 kg    Examination:   General: Appearance:    Obese male in no acute distress- ill appearing     Lungs:     respirations unlabored- appears uncomfortable  Heart:    Normal heart rate. .    MS:   All extremities are intact.    Neurologic:   Awake, alert       Data Reviewed: I have personally reviewed following labs and imaging studies  CBC: Recent Labs  Lab 09/30/23 0216 10/01/23 0339 10/02/23 0243 10/02/23 1345 10/03/23 0137  WBC 20.6* 22.3* 22.4* 26.7* 31.7*  NEUTROABS 18.5* 17.8* 18.2* 22.4* 26.8*  HGB 9.0* 9.3* 9.4* 9.0* 8.7*  HCT 28.0* 28.9* 29.1* 28.2* 27.4*  MCV 89.2 91.7 91.5 91.0 90.7  PLT 577* 617* 572* 603* 525*   Basic Metabolic Panel: Recent Labs  Lab 09/30/23 0216 10/01/23 0339 10/02/23 0243 10/02/23 1345 10/03/23 0137 10/16/2023 0530  NA 135 136 135 136 136 134*  K 3.9 4.0 3.8 4.1 4.5 4.7  CL 97* 100 97* 98 94* 94*  CO2 22 22 26 22 23 24   GLUCOSE 103* 91 95 135* 126* 125*  BUN 68* 79* 48* 53* 61* 74*  CREATININE 6.37* 6.99* 4.69* 5.35* 5.51* 6.85*  CALCIUM 7.8* 8.0* 7.8* 7.7* 7.8* 8.0*  MG 2.3 2.6* 2.1  --  2.2 2.4  PHOS 7.5* 8.9* 5.7*  --  7.8* 9.0*   GFR: Estimated Creatinine Clearance: 9.7 mL/min (A) (by C-G formula based on SCr of 6.85 mg/dL (H)). Liver Function Tests: Recent Labs  Lab 10/01/23 5784 10/02/23 0243 10/02/23 1345 10/03/23 0137 10/26/2023 0530  AST  --   --  34  --   --   ALT  --   --  23  --   --   ALKPHOS  --   --  86  --   --   BILITOT  --   --  1.3*  --   --   PROT  --   --  6.1*  --   --   ALBUMIN 2.0* 2.0* 1.9* 1.8* 1.9*   No results for input(s): "LIPASE", "AMYLASE" in the last 168 hours. No results for input(s): "AMMONIA" in the last 168 hours. Coagulation Profile: Recent Labs  Lab 10/02/23 1345  INR 1.9*   Cardiac Enzymes: No results for input(s): "CKTOTAL", "CKMB", "CKMBINDEX", "TROPONINI" in the last 168 hours. BNP (last 3  results) No results for input(s): "PROBNP" in the last 8760 hours. HbA1C: No results for input(s): "HGBA1C" in the last 72 hours. CBG: Recent Labs  Lab 09/30/23 2110 10/02/23 1105 10/02/23 1229 10/02/23 2204 10/03/23 2128  GLUCAP 132* 129* 118* 126* 105*   Lipid Profile: No results for input(s): "CHOL", "HDL", "LDLCALC", "TRIG", "CHOLHDL", "LDLDIRECT" in the last 72 hours. Thyroid Function Tests: No results for input(s): "TSH", "T4TOTAL", "FREET4", "T3FREE", "  BP: (!) 122/53 (!) 113/59 112/79 123/73  Pulse: 68 (!) 129 79 92  Resp: 20 20 19  (!) 22  Temp: 98.7 F (37.1 C) 98 F (36.7 C) 98.3 F (36.8 C)   TempSrc: Oral Oral Axillary   SpO2: 97% 95% 96% 96%  Weight:      Height:        Intake/Output Summary (Last 24 hours) at 10/20/2023 1011 Last data filed at 10/19/2023 0251 Gross per 24 hour  Intake 278.36 ml  Output 145 ml  Net 133.36 ml   Filed Weights    10/02/23 1235 10/03/23 0336 10/18/2023 0500  Weight: 110 kg 110.9 kg 116.7 kg    Examination:   General: Appearance:    Obese male in no acute distress- ill appearing     Lungs:     respirations unlabored- appears uncomfortable  Heart:    Normal heart rate. .    MS:   All extremities are intact.    Neurologic:   Awake, alert       Data Reviewed: I have personally reviewed following labs and imaging studies  CBC: Recent Labs  Lab 09/30/23 0216 10/01/23 0339 10/02/23 0243 10/02/23 1345 10/03/23 0137  WBC 20.6* 22.3* 22.4* 26.7* 31.7*  NEUTROABS 18.5* 17.8* 18.2* 22.4* 26.8*  HGB 9.0* 9.3* 9.4* 9.0* 8.7*  HCT 28.0* 28.9* 29.1* 28.2* 27.4*  MCV 89.2 91.7 91.5 91.0 90.7  PLT 577* 617* 572* 603* 525*   Basic Metabolic Panel: Recent Labs  Lab 09/30/23 0216 10/01/23 0339 10/02/23 0243 10/02/23 1345 10/03/23 0137 10/16/2023 0530  NA 135 136 135 136 136 134*  K 3.9 4.0 3.8 4.1 4.5 4.7  CL 97* 100 97* 98 94* 94*  CO2 22 22 26 22 23 24   GLUCOSE 103* 91 95 135* 126* 125*  BUN 68* 79* 48* 53* 61* 74*  CREATININE 6.37* 6.99* 4.69* 5.35* 5.51* 6.85*  CALCIUM 7.8* 8.0* 7.8* 7.7* 7.8* 8.0*  MG 2.3 2.6* 2.1  --  2.2 2.4  PHOS 7.5* 8.9* 5.7*  --  7.8* 9.0*   GFR: Estimated Creatinine Clearance: 9.7 mL/min (A) (by C-G formula based on SCr of 6.85 mg/dL (H)). Liver Function Tests: Recent Labs  Lab 10/01/23 5784 10/02/23 0243 10/02/23 1345 10/03/23 0137 10/26/2023 0530  AST  --   --  34  --   --   ALT  --   --  23  --   --   ALKPHOS  --   --  86  --   --   BILITOT  --   --  1.3*  --   --   PROT  --   --  6.1*  --   --   ALBUMIN 2.0* 2.0* 1.9* 1.8* 1.9*   No results for input(s): "LIPASE", "AMYLASE" in the last 168 hours. No results for input(s): "AMMONIA" in the last 168 hours. Coagulation Profile: Recent Labs  Lab 10/02/23 1345  INR 1.9*   Cardiac Enzymes: No results for input(s): "CKTOTAL", "CKMB", "CKMBINDEX", "TROPONINI" in the last 168 hours. BNP (last 3  results) No results for input(s): "PROBNP" in the last 8760 hours. HbA1C: No results for input(s): "HGBA1C" in the last 72 hours. CBG: Recent Labs  Lab 09/30/23 2110 10/02/23 1105 10/02/23 1229 10/02/23 2204 10/03/23 2128  GLUCAP 132* 129* 118* 126* 105*   Lipid Profile: No results for input(s): "CHOL", "HDL", "LDLCALC", "TRIG", "CHOLHDL", "LDLDIRECT" in the last 72 hours. Thyroid Function Tests: No results for input(s): "TSH", "T4TOTAL", "FREET4", "T3FREE", "  BP: (!) 122/53 (!) 113/59 112/79 123/73  Pulse: 68 (!) 129 79 92  Resp: 20 20 19  (!) 22  Temp: 98.7 F (37.1 C) 98 F (36.7 C) 98.3 F (36.8 C)   TempSrc: Oral Oral Axillary   SpO2: 97% 95% 96% 96%  Weight:      Height:        Intake/Output Summary (Last 24 hours) at 10/20/2023 1011 Last data filed at 10/19/2023 0251 Gross per 24 hour  Intake 278.36 ml  Output 145 ml  Net 133.36 ml   Filed Weights    10/02/23 1235 10/03/23 0336 10/18/2023 0500  Weight: 110 kg 110.9 kg 116.7 kg    Examination:   General: Appearance:    Obese male in no acute distress- ill appearing     Lungs:     respirations unlabored- appears uncomfortable  Heart:    Normal heart rate. .    MS:   All extremities are intact.    Neurologic:   Awake, alert       Data Reviewed: I have personally reviewed following labs and imaging studies  CBC: Recent Labs  Lab 09/30/23 0216 10/01/23 0339 10/02/23 0243 10/02/23 1345 10/03/23 0137  WBC 20.6* 22.3* 22.4* 26.7* 31.7*  NEUTROABS 18.5* 17.8* 18.2* 22.4* 26.8*  HGB 9.0* 9.3* 9.4* 9.0* 8.7*  HCT 28.0* 28.9* 29.1* 28.2* 27.4*  MCV 89.2 91.7 91.5 91.0 90.7  PLT 577* 617* 572* 603* 525*   Basic Metabolic Panel: Recent Labs  Lab 09/30/23 0216 10/01/23 0339 10/02/23 0243 10/02/23 1345 10/03/23 0137 10/16/2023 0530  NA 135 136 135 136 136 134*  K 3.9 4.0 3.8 4.1 4.5 4.7  CL 97* 100 97* 98 94* 94*  CO2 22 22 26 22 23 24   GLUCOSE 103* 91 95 135* 126* 125*  BUN 68* 79* 48* 53* 61* 74*  CREATININE 6.37* 6.99* 4.69* 5.35* 5.51* 6.85*  CALCIUM 7.8* 8.0* 7.8* 7.7* 7.8* 8.0*  MG 2.3 2.6* 2.1  --  2.2 2.4  PHOS 7.5* 8.9* 5.7*  --  7.8* 9.0*   GFR: Estimated Creatinine Clearance: 9.7 mL/min (A) (by C-G formula based on SCr of 6.85 mg/dL (H)). Liver Function Tests: Recent Labs  Lab 10/01/23 5784 10/02/23 0243 10/02/23 1345 10/03/23 0137 10/26/2023 0530  AST  --   --  34  --   --   ALT  --   --  23  --   --   ALKPHOS  --   --  86  --   --   BILITOT  --   --  1.3*  --   --   PROT  --   --  6.1*  --   --   ALBUMIN 2.0* 2.0* 1.9* 1.8* 1.9*   No results for input(s): "LIPASE", "AMYLASE" in the last 168 hours. No results for input(s): "AMMONIA" in the last 168 hours. Coagulation Profile: Recent Labs  Lab 10/02/23 1345  INR 1.9*   Cardiac Enzymes: No results for input(s): "CKTOTAL", "CKMB", "CKMBINDEX", "TROPONINI" in the last 168 hours. BNP (last 3  results) No results for input(s): "PROBNP" in the last 8760 hours. HbA1C: No results for input(s): "HGBA1C" in the last 72 hours. CBG: Recent Labs  Lab 09/30/23 2110 10/02/23 1105 10/02/23 1229 10/02/23 2204 10/03/23 2128  GLUCAP 132* 129* 118* 126* 105*   Lipid Profile: No results for input(s): "CHOL", "HDL", "LDLCALC", "TRIG", "CHOLHDL", "LDLDIRECT" in the last 72 hours. Thyroid Function Tests: No results for input(s): "TSH", "T4TOTAL", "FREET4", "T3FREE", "  PROGRESS NOTE    Timothy Mata  NWG:[id number] DOB: Feb 01, 1933 DOA: 2023/10/05 PCP: Sheela Stack    Brief Narrative:  89 yo M w/ pertinent PMH a flutter on Eliquis, COPD, HFrEF, HTN, HLD presents to [facility] ED on 10/05/23 with abdominal pain.   Patient having 1 week of abdominal pain that progressively worsened.  Came to [facility] ED on 05-Oct-2023.  Denies any fevers, N/V/D.  Has had increased urinary frequency.  CXR with mild pulm edema.  CT ABD/pelvis no acute abnormality; mild left hydroureteronephrosis; small bilateral pleural effusions.  WBC/LA elevated. UA with nitrite and large leukocytes. Cultures obtained and given IV fluids.  Patient started on Rocephin.     On 9/22 patient with increased hypotension.  Patient given IV fluids and transferred to ICU.  Patient had PEA arrest with ROSC in 10 minutes.  Patient intubated post CPR.  CT head with no acute abnormality.  CXR with pulmonary edema.  Transferring to [facility] ED.  PCCM consulted.   10/02/2023 pulmonary critical care called back to bedside for hypotension in the setting of history of out-of-hospital arrest now proven refractory to current treatments needs transfer to intensive care unit vasopressor support and resuming antimicrobial therapy.  Assessment and Plan: Acute decompensation 10/3 with shock, considered recurrent septic shock but most likely due to penetrating ulcer in the ascending aorta with circumferential hemorrhagic pericardial effusion, thrombus superimposed on known HFrEF.  He has been weaned off norepinephrine.  He remains in atrial fibrillation on amiodarone.   Dr. Dorris Fetch evaluation:  No surgical options here and palliation recommended.  He remains on broad-spectrum antibiotics with Zosyn and vancomycin for possible superimposed/recurrent infection. -family met with pallaitive care on 10/4: plan to go home with hospice-- continue IV amio and IV Abx while in hospital--- will stop upon d/c home   PEA arrest from outside  hospital Shock> initially septic, resolved, possible recurrent sepsis/ UTI 10/3 vs cardiogenic Penetrating ulcer in the ascending aorta with thrombus with circumferential  hemorrhagic pericardial effusion (without tamponade physiology)  HFrEF/ HFpEF HTN  Acute respiratory failure post arrest Pulmonary edema  Bilateral pleural effusions, left loculated effusion and small  Hx COPD    Afib/flutter on eliquis and amio - remains currently rate controlled in Afib - cont amio until d/c   Hypokalemia AKI on CKD3b, suspected ATN Hematuria Anemia of chronic disease Hyperglycemia   Acute renal failure.   -no further HD as transitioned to comfort care     D/c- central line-- will leave in PICC until I can confirm with hospice   Per palliative care:  DNR decided - Home with hospice - Scheduled Robinul to help with congestion - Recommend for home:             - Continue foley at home             - Roxanol 5 mg SL q4h PRN pain/sob             - Hyoscamine 0.125 mg SL q4h PRN secretions  DVT prophylaxis:     Code Status: Limited: Do not attempt resuscitation (DNR) -DNR-LIMITED -Do Not Intubate/DNI  Family Communication:   Disposition Plan:  Level of care: Telemetry Cardiac Status is: Inpatient Remains inpatient appropriate -- needs home hospice set up, will go home with foley    Consultants:  Cards PCCM CVTS Palliative care   Subjective: No overnight events-- is having an itchy back  Objective: Vitals:   10/02/2023 0525 10/25/2023 0625 10/25/2023 0805 10/08/2023 0900  PROGRESS NOTE    Timothy Mata  NWG:[id number] DOB: Feb 01, 1933 DOA: 2023/10/05 PCP: Sheela Stack    Brief Narrative:  89 yo M w/ pertinent PMH a flutter on Eliquis, COPD, HFrEF, HTN, HLD presents to [facility] ED on 10/05/23 with abdominal pain.   Patient having 1 week of abdominal pain that progressively worsened.  Came to [facility] ED on 05-Oct-2023.  Denies any fevers, N/V/D.  Has had increased urinary frequency.  CXR with mild pulm edema.  CT ABD/pelvis no acute abnormality; mild left hydroureteronephrosis; small bilateral pleural effusions.  WBC/LA elevated. UA with nitrite and large leukocytes. Cultures obtained and given IV fluids.  Patient started on Rocephin.     On 9/22 patient with increased hypotension.  Patient given IV fluids and transferred to ICU.  Patient had PEA arrest with ROSC in 10 minutes.  Patient intubated post CPR.  CT head with no acute abnormality.  CXR with pulmonary edema.  Transferring to [facility] ED.  PCCM consulted.   10/02/2023 pulmonary critical care called back to bedside for hypotension in the setting of history of out-of-hospital arrest now proven refractory to current treatments needs transfer to intensive care unit vasopressor support and resuming antimicrobial therapy.  Assessment and Plan: Acute decompensation 10/3 with shock, considered recurrent septic shock but most likely due to penetrating ulcer in the ascending aorta with circumferential hemorrhagic pericardial effusion, thrombus superimposed on known HFrEF.  He has been weaned off norepinephrine.  He remains in atrial fibrillation on amiodarone.   Dr. Dorris Fetch evaluation:  No surgical options here and palliation recommended.  He remains on broad-spectrum antibiotics with Zosyn and vancomycin for possible superimposed/recurrent infection. -family met with pallaitive care on 10/4: plan to go home with hospice-- continue IV amio and IV Abx while in hospital--- will stop upon d/c home   PEA arrest from outside  hospital Shock> initially septic, resolved, possible recurrent sepsis/ UTI 10/3 vs cardiogenic Penetrating ulcer in the ascending aorta with thrombus with circumferential  hemorrhagic pericardial effusion (without tamponade physiology)  HFrEF/ HFpEF HTN  Acute respiratory failure post arrest Pulmonary edema  Bilateral pleural effusions, left loculated effusion and small  Hx COPD    Afib/flutter on eliquis and amio - remains currently rate controlled in Afib - cont amio until d/c   Hypokalemia AKI on CKD3b, suspected ATN Hematuria Anemia of chronic disease Hyperglycemia   Acute renal failure.   -no further HD as transitioned to comfort care     D/c- central line-- will leave in PICC until I can confirm with hospice   Per palliative care:  DNR decided - Home with hospice - Scheduled Robinul to help with congestion - Recommend for home:             - Continue foley at home             - Roxanol 5 mg SL q4h PRN pain/sob             - Hyoscamine 0.125 mg SL q4h PRN secretions  DVT prophylaxis:     Code Status: Limited: Do not attempt resuscitation (DNR) -DNR-LIMITED -Do Not Intubate/DNI  Family Communication:   Disposition Plan:  Level of care: Telemetry Cardiac Status is: Inpatient Remains inpatient appropriate -- needs home hospice set up, will go home with foley    Consultants:  Cards PCCM CVTS Palliative care   Subjective: No overnight events-- is having an itchy back  Objective: Vitals:   10/02/2023 0525 10/25/2023 0625 10/25/2023 0805 10/08/2023 0900  BP: (!) 122/53 (!) 113/59 112/79 123/73  Pulse: 68 (!) 129 79 92  Resp: 20 20 19  (!) 22  Temp: 98.7 F (37.1 C) 98 F (36.7 C) 98.3 F (36.8 C)   TempSrc: Oral Oral Axillary   SpO2: 97% 95% 96% 96%  Weight:      Height:        Intake/Output Summary (Last 24 hours) at 10/20/2023 1011 Last data filed at 10/19/2023 0251 Gross per 24 hour  Intake 278.36 ml  Output 145 ml  Net 133.36 ml   Filed Weights    10/02/23 1235 10/03/23 0336 10/18/2023 0500  Weight: 110 kg 110.9 kg 116.7 kg    Examination:   General: Appearance:    Obese male in no acute distress- ill appearing     Lungs:     respirations unlabored- appears uncomfortable  Heart:    Normal heart rate. .    MS:   All extremities are intact.    Neurologic:   Awake, alert       Data Reviewed: I have personally reviewed following labs and imaging studies  CBC: Recent Labs  Lab 09/30/23 0216 10/01/23 0339 10/02/23 0243 10/02/23 1345 10/03/23 0137  WBC 20.6* 22.3* 22.4* 26.7* 31.7*  NEUTROABS 18.5* 17.8* 18.2* 22.4* 26.8*  HGB 9.0* 9.3* 9.4* 9.0* 8.7*  HCT 28.0* 28.9* 29.1* 28.2* 27.4*  MCV 89.2 91.7 91.5 91.0 90.7  PLT 577* 617* 572* 603* 525*   Basic Metabolic Panel: Recent Labs  Lab 09/30/23 0216 10/01/23 0339 10/02/23 0243 10/02/23 1345 10/03/23 0137 10/16/2023 0530  NA 135 136 135 136 136 134*  K 3.9 4.0 3.8 4.1 4.5 4.7  CL 97* 100 97* 98 94* 94*  CO2 22 22 26 22 23 24   GLUCOSE 103* 91 95 135* 126* 125*  BUN 68* 79* 48* 53* 61* 74*  CREATININE 6.37* 6.99* 4.69* 5.35* 5.51* 6.85*  CALCIUM 7.8* 8.0* 7.8* 7.7* 7.8* 8.0*  MG 2.3 2.6* 2.1  --  2.2 2.4  PHOS 7.5* 8.9* 5.7*  --  7.8* 9.0*   GFR: Estimated Creatinine Clearance: 9.7 mL/min (A) (by C-G formula based on SCr of 6.85 mg/dL (H)). Liver Function Tests: Recent Labs  Lab 10/01/23 5784 10/02/23 0243 10/02/23 1345 10/03/23 0137 10/26/2023 0530  AST  --   --  34  --   --   ALT  --   --  23  --   --   ALKPHOS  --   --  86  --   --   BILITOT  --   --  1.3*  --   --   PROT  --   --  6.1*  --   --   ALBUMIN 2.0* 2.0* 1.9* 1.8* 1.9*   No results for input(s): "LIPASE", "AMYLASE" in the last 168 hours. No results for input(s): "AMMONIA" in the last 168 hours. Coagulation Profile: Recent Labs  Lab 10/02/23 1345  INR 1.9*   Cardiac Enzymes: No results for input(s): "CKTOTAL", "CKMB", "CKMBINDEX", "TROPONINI" in the last 168 hours. BNP (last 3  results) No results for input(s): "PROBNP" in the last 8760 hours. HbA1C: No results for input(s): "HGBA1C" in the last 72 hours. CBG: Recent Labs  Lab 09/30/23 2110 10/02/23 1105 10/02/23 1229 10/02/23 2204 10/03/23 2128  GLUCAP 132* 129* 118* 126* 105*   Lipid Profile: No results for input(s): "CHOL", "HDL", "LDLCALC", "TRIG", "CHOLHDL", "LDLDIRECT" in the last 72 hours. Thyroid Function Tests: No results for input(s): "TSH", "T4TOTAL", "FREET4", "T3FREE", "  BP: (!) 122/53 (!) 113/59 112/79 123/73  Pulse: 68 (!) 129 79 92  Resp: 20 20 19  (!) 22  Temp: 98.7 F (37.1 C) 98 F (36.7 C) 98.3 F (36.8 C)   TempSrc: Oral Oral Axillary   SpO2: 97% 95% 96% 96%  Weight:      Height:        Intake/Output Summary (Last 24 hours) at 10/20/2023 1011 Last data filed at 10/19/2023 0251 Gross per 24 hour  Intake 278.36 ml  Output 145 ml  Net 133.36 ml   Filed Weights    10/02/23 1235 10/03/23 0336 10/18/2023 0500  Weight: 110 kg 110.9 kg 116.7 kg    Examination:   General: Appearance:    Obese male in no acute distress- ill appearing     Lungs:     respirations unlabored- appears uncomfortable  Heart:    Normal heart rate. .    MS:   All extremities are intact.    Neurologic:   Awake, alert       Data Reviewed: I have personally reviewed following labs and imaging studies  CBC: Recent Labs  Lab 09/30/23 0216 10/01/23 0339 10/02/23 0243 10/02/23 1345 10/03/23 0137  WBC 20.6* 22.3* 22.4* 26.7* 31.7*  NEUTROABS 18.5* 17.8* 18.2* 22.4* 26.8*  HGB 9.0* 9.3* 9.4* 9.0* 8.7*  HCT 28.0* 28.9* 29.1* 28.2* 27.4*  MCV 89.2 91.7 91.5 91.0 90.7  PLT 577* 617* 572* 603* 525*   Basic Metabolic Panel: Recent Labs  Lab 09/30/23 0216 10/01/23 0339 10/02/23 0243 10/02/23 1345 10/03/23 0137 10/16/2023 0530  NA 135 136 135 136 136 134*  K 3.9 4.0 3.8 4.1 4.5 4.7  CL 97* 100 97* 98 94* 94*  CO2 22 22 26 22 23 24   GLUCOSE 103* 91 95 135* 126* 125*  BUN 68* 79* 48* 53* 61* 74*  CREATININE 6.37* 6.99* 4.69* 5.35* 5.51* 6.85*  CALCIUM 7.8* 8.0* 7.8* 7.7* 7.8* 8.0*  MG 2.3 2.6* 2.1  --  2.2 2.4  PHOS 7.5* 8.9* 5.7*  --  7.8* 9.0*   GFR: Estimated Creatinine Clearance: 9.7 mL/min (A) (by C-G formula based on SCr of 6.85 mg/dL (H)). Liver Function Tests: Recent Labs  Lab 10/01/23 5784 10/02/23 0243 10/02/23 1345 10/03/23 0137 10/26/2023 0530  AST  --   --  34  --   --   ALT  --   --  23  --   --   ALKPHOS  --   --  86  --   --   BILITOT  --   --  1.3*  --   --   PROT  --   --  6.1*  --   --   ALBUMIN 2.0* 2.0* 1.9* 1.8* 1.9*   No results for input(s): "LIPASE", "AMYLASE" in the last 168 hours. No results for input(s): "AMMONIA" in the last 168 hours. Coagulation Profile: Recent Labs  Lab 10/02/23 1345  INR 1.9*   Cardiac Enzymes: No results for input(s): "CKTOTAL", "CKMB", "CKMBINDEX", "TROPONINI" in the last 168 hours. BNP (last 3  results) No results for input(s): "PROBNP" in the last 8760 hours. HbA1C: No results for input(s): "HGBA1C" in the last 72 hours. CBG: Recent Labs  Lab 09/30/23 2110 10/02/23 1105 10/02/23 1229 10/02/23 2204 10/03/23 2128  GLUCAP 132* 129* 118* 126* 105*   Lipid Profile: No results for input(s): "CHOL", "HDL", "LDLCALC", "TRIG", "CHOLHDL", "LDLDIRECT" in the last 72 hours. Thyroid Function Tests: No results for input(s): "TSH", "T4TOTAL", "FREET4", "T3FREE", "

## 2023-10-31 DEATH — deceased

## 2023-11-12 ENCOUNTER — Ambulatory Visit: Payer: Medicare HMO | Admitting: Internal Medicine

## 2023-11-12 IMAGING — DX DG CHEST 1V PORT
1 series · 1 of 1 positions shown · non-contrast
Comparison: None.

CLINICAL DATA: Chest pain.

EXAM:
PORTABLE CHEST 1 VIEW

[chest ap]
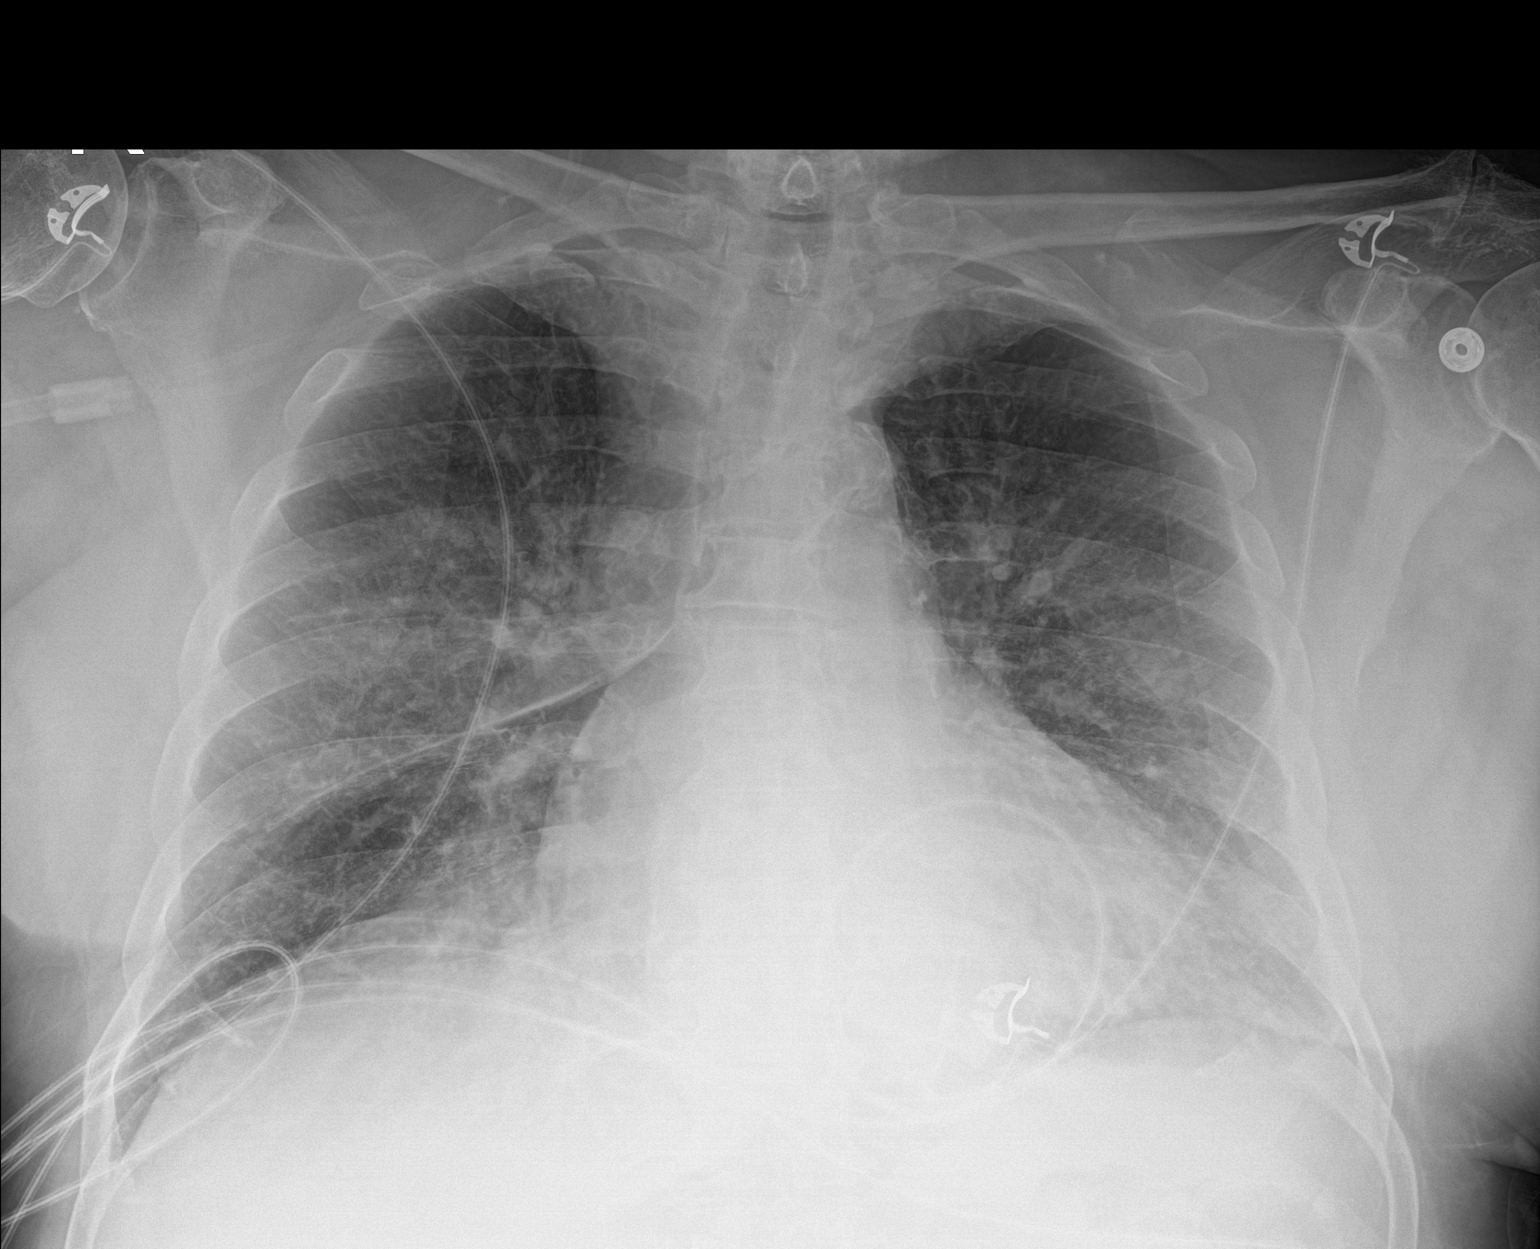

[1 of 1 positions shown; findings below may reference images not displayed]

FINDINGS: Cardiac silhouette is normal in size. No mediastinal or hilar
masses.

Hazy opacity in the perihilar and lower lungs. Mild bilateral
interstitial thickening.

No convincing pleural effusion.  No pneumothorax.

Skeletal structures are grossly intact.
IMPRESSION: 1. Mild bilateral interstitial thickening with hazy central airspace
opacities suspected to be due to pulmonary edema. Consider infection
if there are consistent clinical findings.

## 2023-11-14 IMAGING — CR DG CHEST 2V
2 series · 2 of 2 positions shown · non-contrast
Comparison: 03/24/2022

CLINICAL DATA: COPD, hypertension, follow-up

EXAM:
CHEST - 2 VIEW

[chest lat]
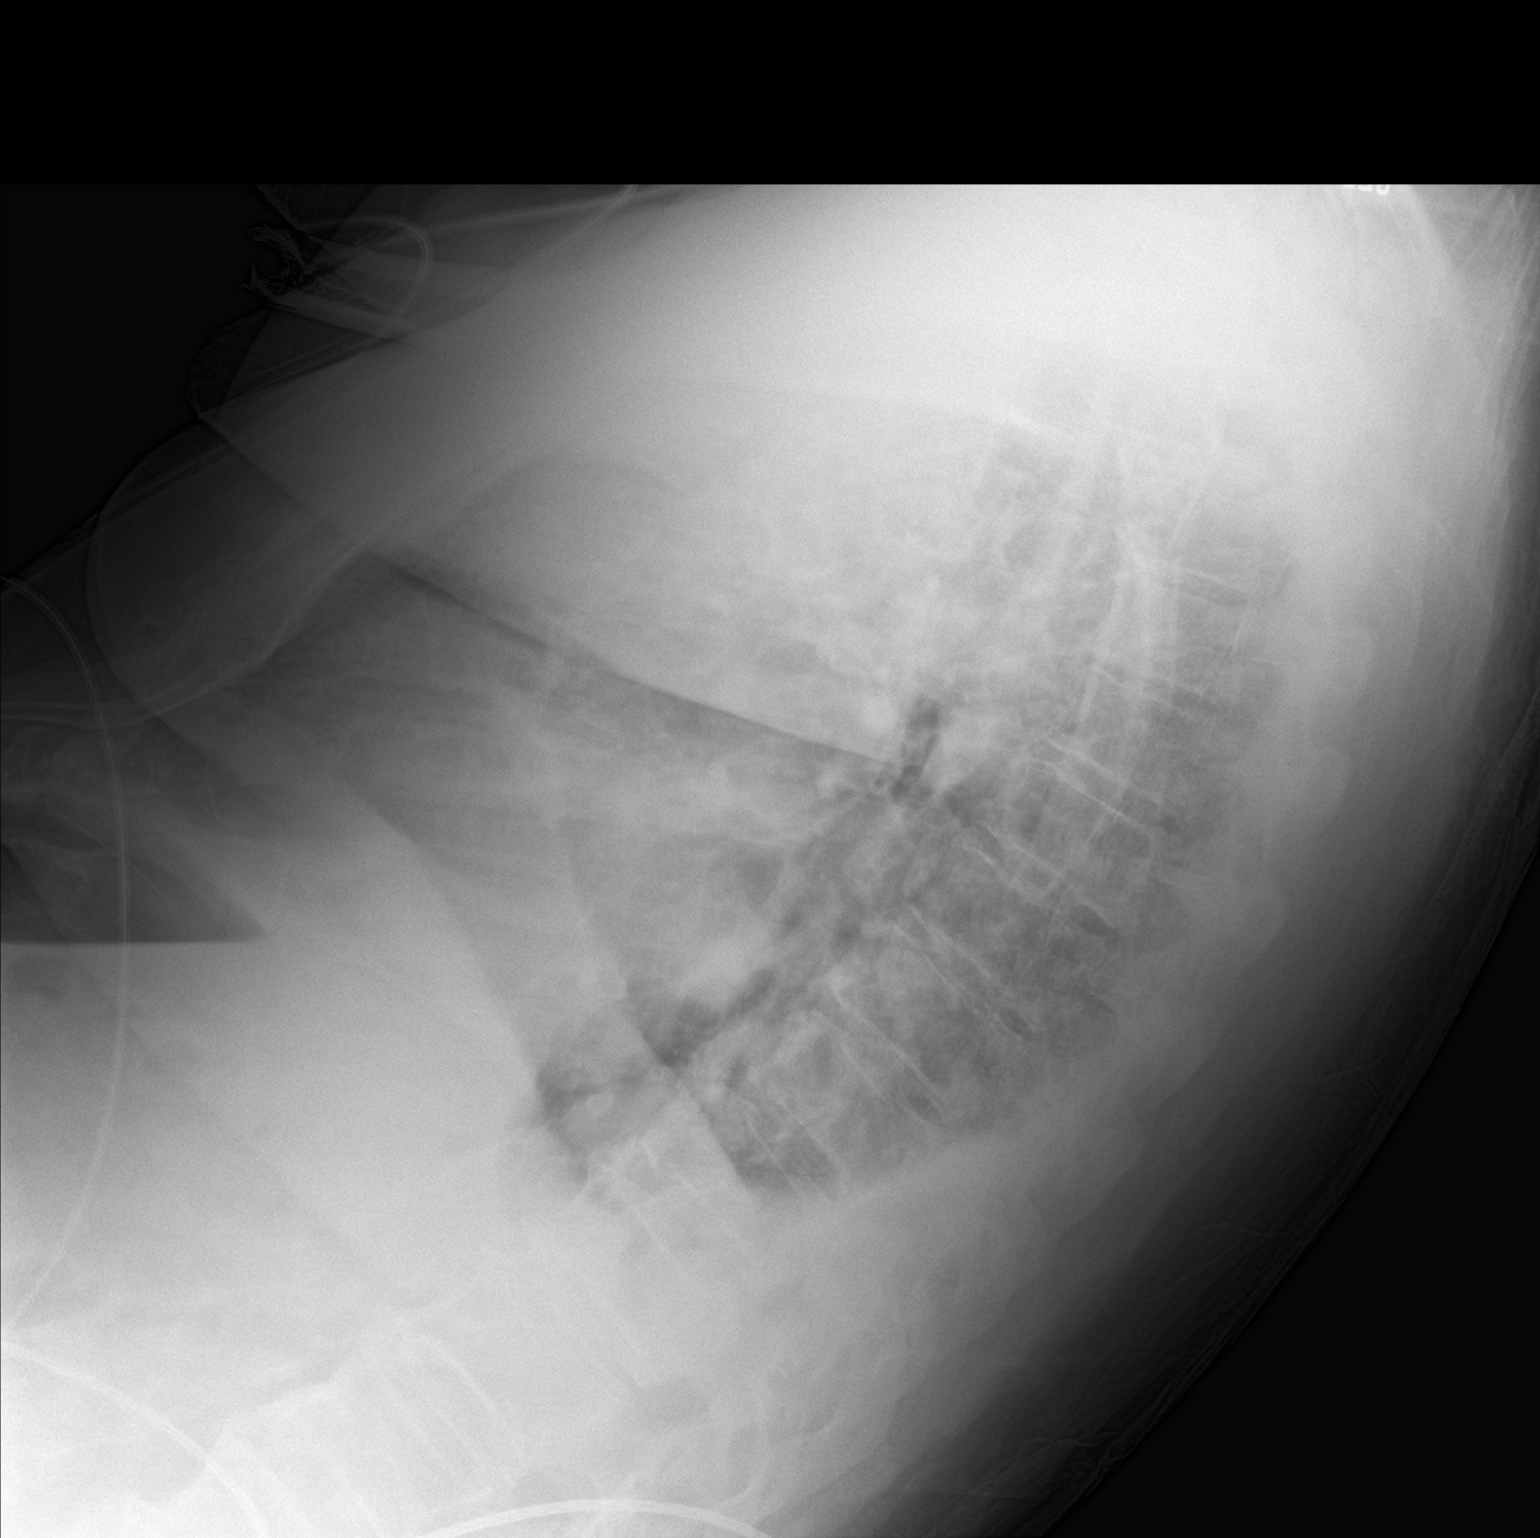

[chest ap]
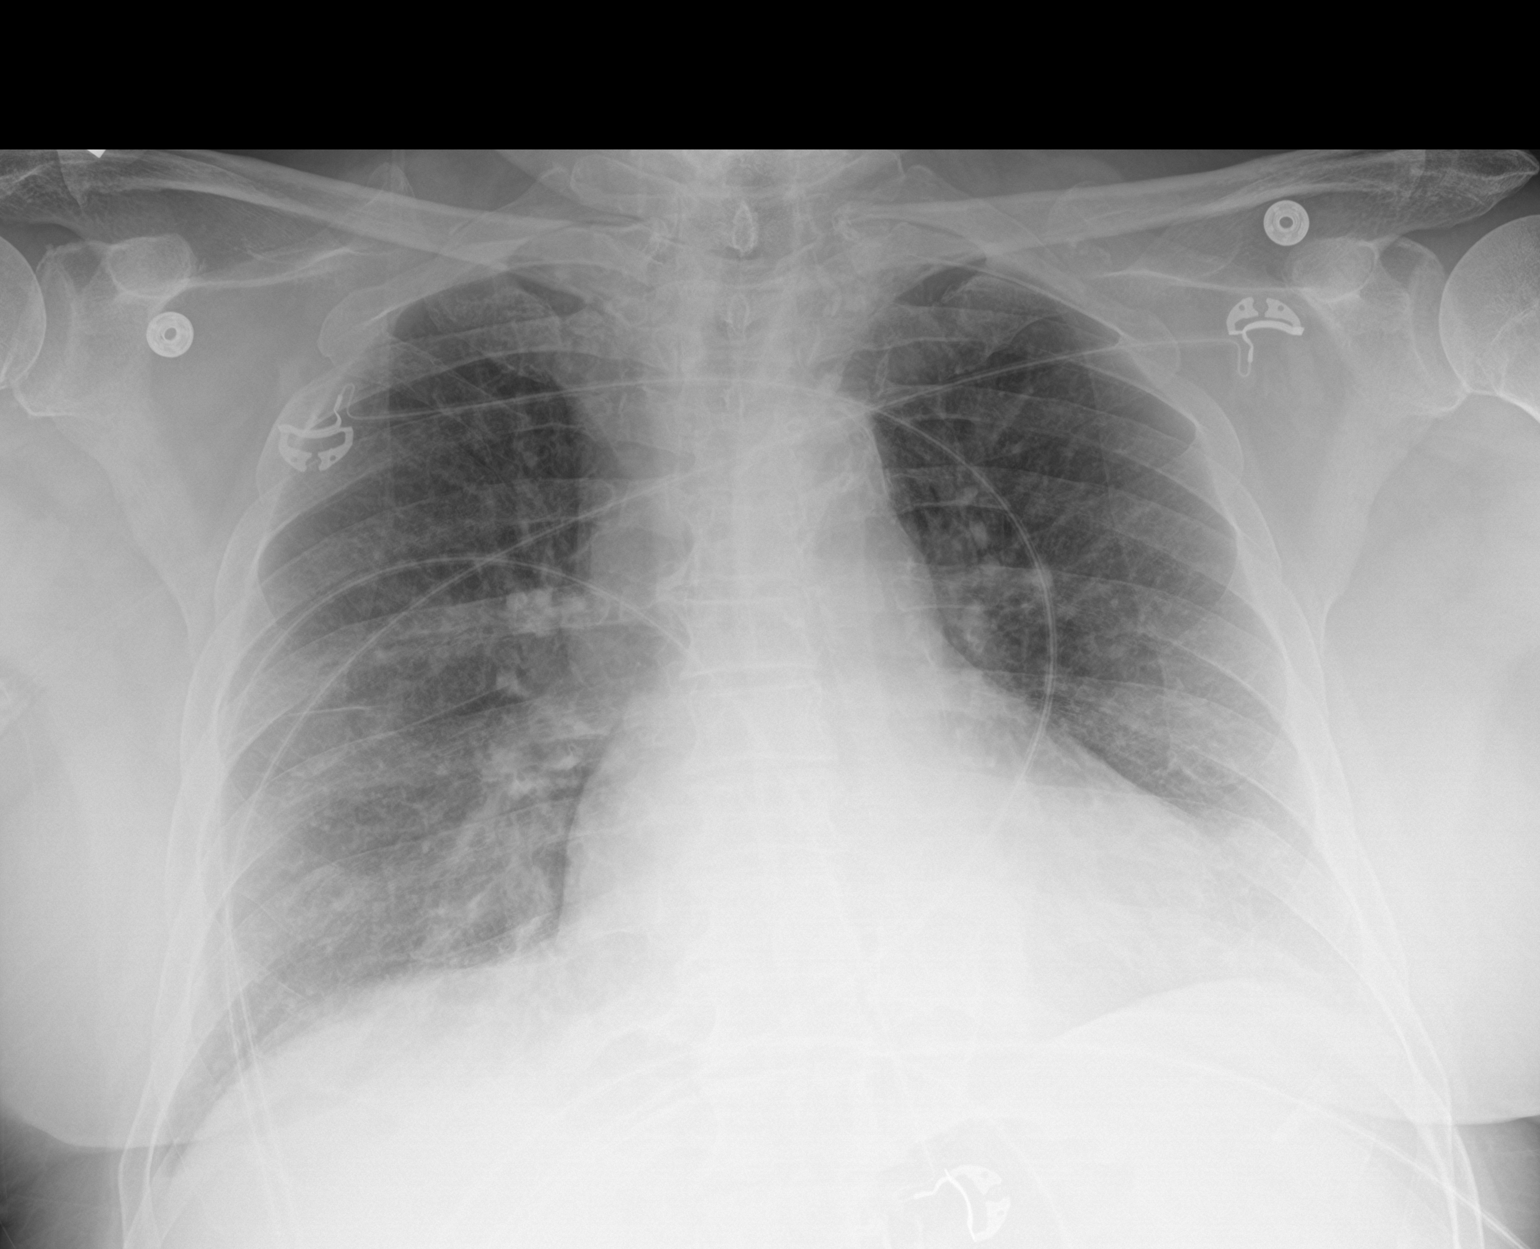

[2 of 2 positions shown; findings below may reference images not displayed]

FINDINGS: Enlargement of cardiac silhouette.

Mediastinal contours and pulmonary vascularity normal.

Atherosclerotic calcification aorta.

Small bibasilar pleural effusions.

No definite acute infiltrate or pneumothorax.
IMPRESSION: Enlargement of cardiac silhouette with small bibasilar pleural
effusions.

Aortic Atherosclerosis ([id number]-YGB.B).

## 2024-03-17 ENCOUNTER — Ambulatory Visit: Payer: Medicare HMO

## 2024-03-17 ENCOUNTER — Encounter (HOSPITAL_COMMUNITY): Payer: Medicare HMO
